# Patient Record
Sex: Female | Born: 1945
Health system: Southern US, Community
[De-identification: ages and names within clinical notes are randomized; demographics above are authoritative.]

## PROBLEM LIST (undated history)

## (undated) DIAGNOSIS — N189 Chronic kidney disease, unspecified: Secondary | ICD-10-CM

## (undated) DIAGNOSIS — E042 Nontoxic multinodular goiter: Secondary | ICD-10-CM

## (undated) DIAGNOSIS — M25562 Pain in left knee: Secondary | ICD-10-CM

## (undated) DIAGNOSIS — Z8739 Personal history of other diseases of the musculoskeletal system and connective tissue: Secondary | ICD-10-CM

## (undated) DIAGNOSIS — I82401 Acute embolism and thrombosis of unspecified deep veins of right lower extremity: Secondary | ICD-10-CM

## (undated) DIAGNOSIS — E7219 Other disorders of sulfur-bearing amino-acid metabolism: Principal | ICD-10-CM

## (undated) DIAGNOSIS — J449 Chronic obstructive pulmonary disease, unspecified: Secondary | ICD-10-CM

## (undated) DIAGNOSIS — Z86718 Personal history of other venous thrombosis and embolism: Secondary | ICD-10-CM

## (undated) DIAGNOSIS — N261 Atrophy of kidney (terminal): Secondary | ICD-10-CM

## (undated) DIAGNOSIS — I10 Essential (primary) hypertension: Secondary | ICD-10-CM

## (undated) DIAGNOSIS — M1990 Unspecified osteoarthritis, unspecified site: Secondary | ICD-10-CM

## (undated) DIAGNOSIS — K219 Gastro-esophageal reflux disease without esophagitis: Secondary | ICD-10-CM

## (undated) HISTORY — DX: Atrophy of kidney (terminal): N26.1

## (undated) HISTORY — PX: ABDOMINAL HYSTERECTOMY: SHX81

## (undated) HISTORY — DX: Pain in left knee: M25.562

## (undated) HISTORY — DX: Nontoxic multinodular goiter: E04.2

## (undated) HISTORY — DX: Personal history of other diseases of the musculoskeletal system and connective tissue: Z87.39

## (undated) HISTORY — DX: Personal history of other venous thrombosis and embolism: Z86.718

## (undated) HISTORY — DX: Essential (primary) hypertension: I10

## (undated) HISTORY — PX: OTHER SURGICAL HISTORY: SHX169

## (undated) HISTORY — DX: Other disorders of sulfur-bearing amino-acid metabolism: E72.19

## (undated) HISTORY — DX: Unspecified osteoarthritis, unspecified site: M19.90

## (undated) HISTORY — DX: Acute embolism and thrombosis of unspecified deep veins of right lower extremity: I82.401

## (undated) HISTORY — DX: Gastro-esophageal reflux disease without esophagitis: K21.9

## (undated) HISTORY — PX: KNEE SURGERY: SHX244

## (undated) HISTORY — PX: TONSILLECTOMY: SUR1361

## (undated) HISTORY — PX: NECK SURGERY: SHX720

---

## 1998-05-16 ENCOUNTER — Ambulatory Visit (HOSPITAL_COMMUNITY): Admission: RE | Admit: 1998-05-16 | Discharge: 1998-05-16 | Payer: Self-pay | Admitting: Neurosurgery

## 1998-05-16 ENCOUNTER — Encounter: Payer: Self-pay | Admitting: Neurosurgery

## 1998-05-30 ENCOUNTER — Ambulatory Visit (HOSPITAL_COMMUNITY): Admission: RE | Admit: 1998-05-30 | Discharge: 1998-05-30 | Payer: Self-pay | Admitting: Neurosurgery

## 1998-05-30 ENCOUNTER — Encounter: Payer: Self-pay | Admitting: Neurosurgery

## 1998-06-10 ENCOUNTER — Ambulatory Visit (HOSPITAL_COMMUNITY): Admission: RE | Admit: 1998-06-10 | Discharge: 1998-06-10 | Payer: Self-pay | Admitting: Neurosurgery

## 1998-06-10 ENCOUNTER — Encounter: Payer: Self-pay | Admitting: Neurosurgery

## 2000-07-16 ENCOUNTER — Other Ambulatory Visit: Admission: RE | Admit: 2000-07-16 | Discharge: 2000-07-16 | Payer: Self-pay | Admitting: Obstetrics and Gynecology

## 2000-08-01 ENCOUNTER — Ambulatory Visit (HOSPITAL_COMMUNITY): Admission: RE | Admit: 2000-08-01 | Discharge: 2000-08-01 | Payer: Self-pay | Admitting: Neurosurgery

## 2000-08-01 ENCOUNTER — Encounter: Payer: Self-pay | Admitting: Neurosurgery

## 2000-08-02 ENCOUNTER — Ambulatory Visit (HOSPITAL_COMMUNITY): Admission: RE | Admit: 2000-08-02 | Discharge: 2000-08-02 | Payer: Self-pay | Admitting: General Surgery

## 2000-08-06 ENCOUNTER — Encounter: Payer: Self-pay | Admitting: Family Medicine

## 2000-08-06 ENCOUNTER — Ambulatory Visit (HOSPITAL_COMMUNITY): Admission: RE | Admit: 2000-08-06 | Discharge: 2000-08-06 | Payer: Self-pay | Admitting: Family Medicine

## 2000-10-09 ENCOUNTER — Inpatient Hospital Stay (HOSPITAL_COMMUNITY): Admission: AD | Admit: 2000-10-09 | Discharge: 2000-10-13 | Payer: Self-pay | Admitting: Neurosurgery

## 2000-10-09 ENCOUNTER — Encounter: Payer: Self-pay | Admitting: Neurosurgery

## 2000-10-10 ENCOUNTER — Encounter: Payer: Self-pay | Admitting: Neurosurgery

## 2000-10-30 ENCOUNTER — Encounter: Admission: RE | Admit: 2000-10-30 | Discharge: 2000-10-30 | Payer: Self-pay | Admitting: Neurosurgery

## 2000-10-30 ENCOUNTER — Encounter: Payer: Self-pay | Admitting: Neurosurgery

## 2001-10-08 ENCOUNTER — Ambulatory Visit (HOSPITAL_COMMUNITY): Admission: RE | Admit: 2001-10-08 | Discharge: 2001-10-08 | Payer: Self-pay | Admitting: Obstetrics and Gynecology

## 2001-10-08 ENCOUNTER — Encounter: Payer: Self-pay | Admitting: Obstetrics and Gynecology

## 2001-10-09 ENCOUNTER — Encounter: Payer: Self-pay | Admitting: Emergency Medicine

## 2001-10-09 ENCOUNTER — Inpatient Hospital Stay (HOSPITAL_COMMUNITY): Admission: EM | Admit: 2001-10-09 | Discharge: 2001-10-14 | Payer: Self-pay | Admitting: Emergency Medicine

## 2001-10-10 ENCOUNTER — Encounter: Payer: Self-pay | Admitting: Urology

## 2001-10-13 ENCOUNTER — Encounter: Payer: Self-pay | Admitting: Urology

## 2001-10-16 ENCOUNTER — Ambulatory Visit (HOSPITAL_COMMUNITY): Admission: RE | Admit: 2001-10-16 | Discharge: 2001-10-16 | Payer: Self-pay | Admitting: Family Medicine

## 2001-10-16 ENCOUNTER — Encounter: Payer: Self-pay | Admitting: Family Medicine

## 2001-11-10 ENCOUNTER — Encounter: Payer: Self-pay | Admitting: Urology

## 2001-11-10 ENCOUNTER — Ambulatory Visit (HOSPITAL_COMMUNITY): Admission: RE | Admit: 2001-11-10 | Discharge: 2001-11-10 | Payer: Self-pay | Admitting: Urology

## 2001-12-15 ENCOUNTER — Encounter: Payer: Self-pay | Admitting: Urology

## 2001-12-15 ENCOUNTER — Ambulatory Visit (HOSPITAL_COMMUNITY): Admission: RE | Admit: 2001-12-15 | Discharge: 2001-12-15 | Payer: Self-pay | Admitting: Urology

## 2001-12-19 ENCOUNTER — Encounter: Admission: RE | Admit: 2001-12-19 | Discharge: 2001-12-19 | Payer: Self-pay | Admitting: Neurosurgery

## 2001-12-19 ENCOUNTER — Encounter: Payer: Self-pay | Admitting: Neurosurgery

## 2002-01-30 ENCOUNTER — Encounter (HOSPITAL_COMMUNITY): Admission: RE | Admit: 2002-01-30 | Discharge: 2002-03-01 | Payer: Self-pay

## 2002-01-30 ENCOUNTER — Encounter: Payer: Self-pay | Admitting: Urology

## 2002-07-17 ENCOUNTER — Emergency Department (HOSPITAL_COMMUNITY): Admission: EM | Admit: 2002-07-17 | Discharge: 2002-07-17 | Payer: Self-pay | Admitting: Emergency Medicine

## 2002-07-17 ENCOUNTER — Encounter: Payer: Self-pay | Admitting: Emergency Medicine

## 2002-07-23 ENCOUNTER — Ambulatory Visit (HOSPITAL_COMMUNITY): Admission: RE | Admit: 2002-07-23 | Discharge: 2002-07-23 | Payer: Self-pay | Admitting: Family Medicine

## 2002-07-23 ENCOUNTER — Encounter: Payer: Self-pay | Admitting: Family Medicine

## 2003-01-19 ENCOUNTER — Encounter: Payer: Self-pay | Admitting: Obstetrics and Gynecology

## 2003-01-19 ENCOUNTER — Ambulatory Visit (HOSPITAL_COMMUNITY): Admission: RE | Admit: 2003-01-19 | Discharge: 2003-01-19 | Payer: Self-pay | Admitting: Obstetrics and Gynecology

## 2004-04-25 ENCOUNTER — Ambulatory Visit (HOSPITAL_COMMUNITY): Admission: RE | Admit: 2004-04-25 | Discharge: 2004-04-25 | Payer: Self-pay | Admitting: Obstetrics and Gynecology

## 2004-05-11 ENCOUNTER — Ambulatory Visit (HOSPITAL_COMMUNITY): Admission: RE | Admit: 2004-05-11 | Discharge: 2004-05-11 | Payer: Self-pay | Admitting: Family Medicine

## 2004-11-24 ENCOUNTER — Ambulatory Visit (HOSPITAL_COMMUNITY): Admission: RE | Admit: 2004-11-24 | Discharge: 2004-11-24 | Payer: Self-pay | Admitting: Cardiovascular Disease

## 2004-11-30 ENCOUNTER — Ambulatory Visit (HOSPITAL_COMMUNITY): Admission: RE | Admit: 2004-11-30 | Discharge: 2004-11-30 | Payer: Self-pay | Admitting: Cardiovascular Disease

## 2005-01-19 ENCOUNTER — Ambulatory Visit (HOSPITAL_COMMUNITY): Admission: RE | Admit: 2005-01-19 | Discharge: 2005-01-19 | Payer: Self-pay | Admitting: Family Medicine

## 2005-05-15 ENCOUNTER — Ambulatory Visit (HOSPITAL_COMMUNITY): Admission: RE | Admit: 2005-05-15 | Discharge: 2005-05-15 | Payer: Self-pay | Admitting: Obstetrics and Gynecology

## 2005-06-01 ENCOUNTER — Ambulatory Visit (HOSPITAL_COMMUNITY): Admission: RE | Admit: 2005-06-01 | Discharge: 2005-06-01 | Payer: Self-pay | Admitting: Family Medicine

## 2006-02-05 ENCOUNTER — Ambulatory Visit (HOSPITAL_COMMUNITY): Admission: RE | Admit: 2006-02-05 | Discharge: 2006-02-05 | Payer: Self-pay | Admitting: Family Medicine

## 2006-05-20 ENCOUNTER — Ambulatory Visit (HOSPITAL_COMMUNITY): Admission: RE | Admit: 2006-05-20 | Discharge: 2006-05-20 | Payer: Self-pay | Admitting: Obstetrics and Gynecology

## 2006-05-27 ENCOUNTER — Encounter: Admission: RE | Admit: 2006-05-27 | Discharge: 2006-05-27 | Payer: Self-pay | Admitting: Obstetrics and Gynecology

## 2006-11-22 ENCOUNTER — Ambulatory Visit (HOSPITAL_COMMUNITY): Admission: RE | Admit: 2006-11-22 | Discharge: 2006-11-22 | Payer: Self-pay | Admitting: Family Medicine

## 2007-06-24 ENCOUNTER — Ambulatory Visit (HOSPITAL_COMMUNITY): Admission: RE | Admit: 2007-06-24 | Discharge: 2007-06-24 | Payer: Self-pay | Admitting: Obstetrics and Gynecology

## 2007-07-10 ENCOUNTER — Ambulatory Visit (HOSPITAL_COMMUNITY): Admission: RE | Admit: 2007-07-10 | Discharge: 2007-07-10 | Payer: Self-pay | Admitting: Family Medicine

## 2008-03-11 ENCOUNTER — Ambulatory Visit (HOSPITAL_COMMUNITY): Admission: RE | Admit: 2008-03-11 | Discharge: 2008-03-11 | Payer: Self-pay | Admitting: Family Medicine

## 2008-03-16 ENCOUNTER — Ambulatory Visit (HOSPITAL_COMMUNITY): Admission: RE | Admit: 2008-03-16 | Discharge: 2008-03-16 | Payer: Self-pay | Admitting: Family Medicine

## 2008-07-19 ENCOUNTER — Ambulatory Visit (HOSPITAL_COMMUNITY): Admission: RE | Admit: 2008-07-19 | Discharge: 2008-07-19 | Payer: Self-pay | Admitting: Obstetrics and Gynecology

## 2008-08-02 ENCOUNTER — Ambulatory Visit (HOSPITAL_COMMUNITY): Admission: RE | Admit: 2008-08-02 | Discharge: 2008-08-02 | Payer: Self-pay | Admitting: Family Medicine

## 2008-09-15 ENCOUNTER — Ambulatory Visit (HOSPITAL_COMMUNITY): Admission: RE | Admit: 2008-09-15 | Discharge: 2008-09-15 | Payer: Self-pay | Admitting: Family Medicine

## 2009-03-30 ENCOUNTER — Ambulatory Visit (HOSPITAL_COMMUNITY): Admission: RE | Admit: 2009-03-30 | Discharge: 2009-03-30 | Payer: Self-pay | Admitting: Family Medicine

## 2009-03-30 ENCOUNTER — Encounter: Payer: Self-pay | Admitting: Orthopedic Surgery

## 2009-04-21 ENCOUNTER — Ambulatory Visit: Payer: Self-pay | Admitting: Orthopedic Surgery

## 2009-04-21 DIAGNOSIS — IMO0002 Reserved for concepts with insufficient information to code with codable children: Secondary | ICD-10-CM

## 2009-04-21 DIAGNOSIS — M1710 Unilateral primary osteoarthritis, unspecified knee: Secondary | ICD-10-CM | POA: Insufficient documentation

## 2009-04-21 DIAGNOSIS — I10 Essential (primary) hypertension: Secondary | ICD-10-CM | POA: Insufficient documentation

## 2009-04-23 ENCOUNTER — Encounter: Payer: Self-pay | Admitting: Orthopedic Surgery

## 2009-07-16 ENCOUNTER — Emergency Department (HOSPITAL_COMMUNITY): Admission: EM | Admit: 2009-07-16 | Discharge: 2009-07-16 | Payer: Self-pay | Admitting: Emergency Medicine

## 2009-08-02 ENCOUNTER — Ambulatory Visit (HOSPITAL_COMMUNITY): Admission: RE | Admit: 2009-08-02 | Discharge: 2009-08-02 | Payer: Self-pay | Admitting: Obstetrics and Gynecology

## 2010-03-14 ENCOUNTER — Ambulatory Visit (HOSPITAL_COMMUNITY)
Admission: RE | Admit: 2010-03-14 | Discharge: 2010-03-14 | Payer: Self-pay | Source: Home / Self Care | Attending: Family Medicine | Admitting: Family Medicine

## 2010-04-23 ENCOUNTER — Encounter: Payer: Self-pay | Admitting: Obstetrics and Gynecology

## 2010-04-24 ENCOUNTER — Ambulatory Visit (HOSPITAL_COMMUNITY)
Admission: RE | Admit: 2010-04-24 | Discharge: 2010-04-24 | Payer: Self-pay | Source: Home / Self Care | Attending: Family Medicine | Admitting: Family Medicine

## 2010-05-02 NOTE — Letter (Signed)
Summary: History form  History form   Imported By: Jacklynn Ganong 04/27/2009 14:33:36  _____________________________________________________________________  External Attachment:    Type:   Image     Comment:   External Document

## 2010-05-02 NOTE — Letter (Signed)
Summary: *Orthopedic Consult Note  [facility]  [address] [place] [address]  [place], [place] [address]   Phone: [phone]  Fax: [phone]    Re:    [name] DOB:    1945/09/24   Dear: Loraine Leriche  Thank you for requesting that we see the above patient for consultation.  A copy of the detailed office note will be sent under separate cover for your review.  On evaluation today is consistent with osteoarthritis of LEFT knee.  However, the patient's symptoms have resolved using Voltaren gel and we have encouraged her to continue with this.  She is to call us back if has any increase in pain or recurrence of symptoms.   Thank you for this opportunity to look after your patient.  Sincerely,   Terrance Mass. MD.

## 2010-05-02 NOTE — Assessment & Plan Note (Signed)
Summary: left knee pain xr at [facility]   Visit Type:  Initial Consult Referring Provider:  Dr. Nobie Putnam Primary Provider:  same  CC:  LEFT knee pain for several years.  History of Present Illness: This is a 65 year old female who presents to [place] with LEFT knee pain.  Referral from Dr.Cresenzo for consult and treat. Dr. Nobie Putnam gave her an IM shot and Voltaren gel and Vicodin.    The patient has had pain in the LEFT knee for several years but the last episode started in December of 2010.  She complains of pain in the back of the knee and on both sides of the knee although currently it is rated as a zero.  It seems to come and go it is getting worse.  She tried some Voltaren gel and that seemed to help.  She fell that something was pulling in the knee and she was having trouble straightening out the leg.  She denied any injury.  She did note some swelling. chief complaint: LEFT KNEE.  -xrays done & where: [facility] on 03-30-09, shows degenerative OA changes.I saw his x-rays and I would agree that these x-rays show degenerative osteoarthritic changes   Allergies (verified): 1)  ! Ace Inhibitors 2)  ! Vicodin (Hydrocodone-Acetaminophen)  Past History:  Past Medical History: Back problems Knee problems Foot problems High blood pressure  Past Surgical History: Right knee Neck Total Abdominal Hysterectomy  Review of Systems General:  Complains of fatigue; denies weight loss, weight gain, fever, and chills. Cardiac :  Complains of phlebitis; denies chest pain, angina, heart attack, heart failure, poor circulation, and blood clots. Resp:  Complains of cough; denies short of breath, difficulty breathing, COPD, and pneumonia. GI:  Complains of reflux; denies nausea, vomiting, diarrhea, constipation, difficulty swallowing, ulcers, and GERD. GU:  Denies kidney failure, kidney transplant, kidney stones, burning, poor stream, testicular cancer,  blood in urine, and . Neuro:  Complains of weakness and unsteady walking; denies headache, dizziness, migraines, numbness, and tremor. MS:  Complains of joint pain, gout, and osteoporosis; denies rheumatoid arthritis, joint swelling, bone cancer, and . Endo:  Complains of diabetes; denies thyroid disease and goiter. Psych:  Denies depression, mood swings, anxiety, panic attack, bipolar, and schizophrenia. Derm:  Denies eczema, cancer, and itching. EENT:  Complains of hoarseness; denies poor vision, cataracts, glaucoma, poor hearing, vertigo, ears ringing, sinusitis, toothaches, and bleeding gums. Immunology:  Denies seasonal allergies, sinus problems, and allergic to bee stings. Lymphatic:  Denies lymph node cancer and lymph edema.  Physical Exam  Additional Exam:  GEN: well developed, well nourished, normal grooming and hygiene, no deformity and normal body habitus.   CDV: pulses are normal, no edema, no erythema. no tenderness  Lymph: normal lymph nodes   Skin: no rashes, skin lesions or open sores   NEURO: normal coordination, reflexes, sensation.   Psyche: awake, alert and oriented. Mood normal   Gait: is normal inspection no tenderness at this time no swelling no redness no  Range of motion is normal  Motor exam 5 over 5 extensor and flexion power  Stability: ACL PCL intact collateral ligaments stable.  Provocative test for meniscal tear negative.  The right lower extremity: had normal alignment, ROM, Strength and stability     Impression & Recommendations:  Problem # 1:  ARTHRITIS, LEFT KNEE (ICD-716.96) Assessment New  Orders: New Patient Level III (14782)  Patient Instructions: 1)  Use voltaren gel  2)  Call us if pain comes back

## 2010-06-09 ENCOUNTER — Other Ambulatory Visit: Payer: Self-pay | Admitting: Obstetrics and Gynecology

## 2010-06-09 DIAGNOSIS — Z139 Encounter for screening, unspecified: Secondary | ICD-10-CM

## 2010-08-08 ENCOUNTER — Ambulatory Visit (HOSPITAL_COMMUNITY)
Admission: RE | Admit: 2010-08-08 | Discharge: 2010-08-08 | Disposition: A | Discharge status: Home / Self Care | Payer: Medicare Other | Source: Ambulatory Visit | Attending: Obstetrics and Gynecology | Admitting: Obstetrics and Gynecology

## 2010-08-08 DIAGNOSIS — Z1231 Encounter for screening mammogram for malignant neoplasm of breast: Secondary | ICD-10-CM | POA: Insufficient documentation

## 2010-08-08 DIAGNOSIS — Z139 Encounter for screening, unspecified: Secondary | ICD-10-CM

## 2010-08-11 NOTE — H&P (Signed)
  NAME:  [name], [name]                ACCOUNT NO.:  [id number]  MEDICAL RECORD NO.:  [id number]           PATIENT TYPE:  LOCATION:                                 FACILITY:  PHYSICIAN:  Dalia Heading, M.D.  DATE OF BIRTH:  Sep 16, 1945  DATE OF ADMISSION: DATE OF DISCHARGE:  LH                             HISTORY & PHYSICAL   CHIEF COMPLAINT:  Need for screening colonoscopy, GERD.  HISTORY OF PRESENT ILLNESS:  The patient is a 65 year old black female who presents for a screening colonoscopy.  She denies any nausea, vomiting, diarrhea, constipation, hematochezia, or melena.  She does have chronic history of GERD.  PAST MEDICAL HISTORY: 1. Non-insulin-dependent diabetes mellitus. 2. Hypertension. 3. High cholesterol levels.  PAST SURGICAL HISTORY:  Neck surgery, knee surgery, total abdominal hysterectomy.  CURRENT MEDICATIONS:  Actos, metformin, nifedipine, estradiol, simvastatin, aspirin, fish oil, and vitamins.  ALLERGIES: 1. VICODIN. 2. ACE INHIBITORS.  REVIEW OF SYSTEMS:  The patient denies any recent cardiopulmonary difficulties or bleeding disorders.  FAMILY MEDICAL HISTORY:  There is no family medical history of colon cancer.  PHYSICAL EXAMINATION:  GENERAL:  The patient is a well-developed, well- nourished black female in no acute distress. HEENT:  Unremarkable. LUNGS:  Clear to auscultation with equal breath sounds bilaterally. HEART:  Regular rate and rhythm without S3, S4, or murmurs. ABDOMEN:  Soft, nontender, nondistended.  No hepatosplenomegaly, masses, hernias are identified. RECTAL:  Deferred to the procedure.  IMPRESSION:  Need for screening colonoscopy, chronic reflux disease.  PLAN:  The patient is scheduled for an EGD and colonoscopy on Aug 15, 2010.  Risks and benefits of the procedure including bleeding and perforation were fully explained to the patient, gave informed consent.     Dalia Heading, M.D.     MAJ/MEDQ  D:  08/01/2010   T:  08/02/2010  Job:  [id number]  cc:   Cristopher Estimable, M.D. Fax: [phone]  Electronically Signed by Franky Macho M.D. on 08/11/2010 10:51:28 AM

## 2010-08-15 ENCOUNTER — Ambulatory Visit (HOSPITAL_COMMUNITY)
Admission: RE | Admit: 2010-08-15 | Discharge: 2010-08-15 | Disposition: A | Discharge status: Home / Self Care | Payer: Medicare Other | Source: Ambulatory Visit | Attending: General Surgery | Admitting: General Surgery

## 2010-08-15 DIAGNOSIS — K219 Gastro-esophageal reflux disease without esophagitis: Secondary | ICD-10-CM | POA: Insufficient documentation

## 2010-08-15 DIAGNOSIS — K222 Esophageal obstruction: Secondary | ICD-10-CM | POA: Insufficient documentation

## 2010-08-15 DIAGNOSIS — Z7982 Long term (current) use of aspirin: Secondary | ICD-10-CM | POA: Insufficient documentation

## 2010-08-15 DIAGNOSIS — I10 Essential (primary) hypertension: Secondary | ICD-10-CM | POA: Insufficient documentation

## 2010-08-15 DIAGNOSIS — Z1211 Encounter for screening for malignant neoplasm of colon: Secondary | ICD-10-CM | POA: Insufficient documentation

## 2010-08-15 DIAGNOSIS — K644 Residual hemorrhoidal skin tags: Secondary | ICD-10-CM | POA: Insufficient documentation

## 2010-08-15 DIAGNOSIS — E7800 Pure hypercholesterolemia, unspecified: Secondary | ICD-10-CM | POA: Insufficient documentation

## 2010-08-15 DIAGNOSIS — Z79899 Other long term (current) drug therapy: Secondary | ICD-10-CM | POA: Insufficient documentation

## 2010-08-15 DIAGNOSIS — E119 Type 2 diabetes mellitus without complications: Secondary | ICD-10-CM | POA: Insufficient documentation

## 2010-08-15 LAB — GLUCOSE, CAPILLARY: Glucose-Capillary: 184 mg/dL — ABNORMAL HIGH (ref 70–99)

## 2010-08-16 NOTE — Op Note (Signed)
NAME:  [name], [name]                ACCOUNT NO.:  [id number]  MEDICAL RECORD NO.:  [id number]           PATIENT TYPE:  O  LOCATION:  DAYP                          FACILITY:  [facility]  PHYSICIAN:  Dalia Heading, M.D.  DATE OF BIRTH:  09-28-1945  DATE OF PROCEDURE:  08/15/2010 DATE OF DISCHARGE:                              OPERATIVE REPORT   PREOPERATIVE DIAGNOSIS:  Need for screening colonoscopy, gastroesophageal reflux disease.  POSTOPERATIVE DIAGNOSIS:  Need for screening colonoscopy, gastroesophageal reflux disease.  PROCEDURE:  Colonoscopy, EGD with biopsy.  SURGEON:  Dalia Heading, MD  ANESTHESIA:  Demerol 50 mg IV, Versed 5 mg IV.  INDICATIONS:  The patient is a 65 year old black female who presents for screening colonoscopy and an EGD for gastroesophageal reflux disease. The risks and benefits of the procedures including bleeding and perforation were fully explained to the patient, gave informed consent.  PROCEDURE NOTE:  The patient was placed in the left lateral decubitus position after placement of nasal cannula, pulse oximetry, noninvasive blood pressure monitoring.  Demerol and Versed were used throughout the procedure for anesthesia.  Rectal examination was performed, which revealed some mild external hemorrhoidal disease.  The colonoscope was advanced to the cecum without difficulty.  Confirmation of placement to the cecum was done using transabdominal palpation and landmarks.  The bowel preparation was adequate.  The cecum was fully visualized and noted to be within normal limits.  The ascending colon, transverse colon, descending colon, sigmoid colon, and rectum are all within normal limits.  No abnormal lesions were noted.  The dentate line was inspected and noted to be within normal limits.  The colonoscope was removed after evacuation of air from the colon and rectum.  Next, an EGD was performed.  Hurricaine spray was used on the oropharynx.  The  endoscope was advanced down to the second portion of the duodenum without difficulty.  The first and second portions of the duodenum were within normal limits.  No abnormal lesions were noted. The pylorus was noted to be widely patent.  A prepyloric mucosal biopsy was taken for a CLO test.  The patient had a very elongated J-shaped stomach.  On retroflexion, the patient was noted to have a mild sliding hiatal hernia.  A Schatzki's ring was noted at the Z-line, though this was not in need of dilatation.  There was no evidence of Barrett's esophagitis.  The Z-line was noted to be at 39 cm from the teeth.  The esophagus was within normal limits.  No abnormal lesions were noted. All air was evacuate from the stomach and esophagus prior to removal of the endoscope.  The patient tolerated both the procedures well, was transferred back to Day Surgery in stable condition.  COMPLICATIONS:  None.  SPECIMEN:  CLO test.  RECOMMENDATIONS:  The patient should continue on her proton pump inhibitor.  Recommendations on avoiding reflux disease with hiatal hernia will be given.  A screening colonoscopy is recommended in 10 years.     Dalia Heading, M.D.     MAJ/MEDQ  D:  08/15/2010  T:  08/15/2010  Job:  [id number]  cc:   Corrie Mckusick, M.D. Fax: [phone]  Electronically Signed by Franky Macho M.D. on 08/16/2010 08:51:53 AM

## 2010-08-18 NOTE — Op Note (Signed)
NAME:  [name], [name]                          ACCOUNT NO.:  [id number]   MEDICAL RECORD NO.:  [id number]                   PATIENT TYPE:  AMB   LOCATION:  DAY                                  FACILITY:  [facility]   PHYSICIAN:  Dennie Maizes, M.D.                DATE OF BIRTH:  12-Mar-1946   DATE OF PROCEDURE:  11/10/2001  DATE OF DISCHARGE:  11/10/2001                                 OPERATIVE REPORT   PREOPERATIVE DIAGNOSES:  Right hydronephrosis, right renal colic, right  distal ureteral stricture.   POSTOPERATIVE DIAGNOSES:  Right hydronephrosis, right renal colic, right  distal ureteral stricture.   OPERATION/PROCEDURE:  Cystoscopy, removal of right ureteral stent, balloon  dilation of right ureteral stricture, right ureteroscopy and right ureteral  stent placement.   ANESTHESIA:  Spinal.   SURGEON:  Dennie Maizes, M.D.   COMPLICATIONS:  None.   INDICATIONS FOR PROCEDURE:  This 65 year old female is admitted to the  hospital with severe right renal colic a few weeks ago. Evaluation revealed  moderate right hydronephrosis in the upper right ureter. The patient  underwent further evaluation with cystoscopy, right retrograde pyelogram and  right ureteral stent placement. A right ureteral stricture was noted. The  patient was brought back to the OR today for cystoscopy, retrograde  pyelogram, balloon dilation of the stricture and stent placement.   DESCRIPTION OF PROCEDURE:  Spinal anesthesia was induced ad the patient was  placed on the OR table in the dorsal lithotomy position. The lower abdomen  and genitalia were prepped and draped in a sterile fashion. Cystoscopy was  done with a 25 [place] scope. The appearance of the bladder was normal. The  lower end of the stent was then held with the grasping forceps and removed  without any difficulty. A 5 French rigid catheter was then placed in the  right ureteral orifice and a retrograde pyelogram was done by using about 10  cc  of Renografin-60. This revealed a distal ureteral stricture about 5 cm in  length or a sacroiliac giant lesion. Mild right hydronephrosis was also  noted.   A 6 French open ended catheter was then placed in the right ureteral  orifice. A 0.038 inch Benson guidewire with a flexible tip was then inserted  in the right collecting system. The open ended catheter was then removed. A  15 French balloon dilating catheter was then inserted over the guidewire and  the balloon was placed in the region of the stricture. With fluoroscopy  guidance, the stricture was dilated with inflation of the balloon. The  balloon dilating catheter was then removed. A 20 cm ureteral access sheath  was then placed in the right distal ureter. The guidewire was then removed.  The flexible ureteroscope was then inserted into the renal pelvis.  Examination was done of the collecting system while withdrawing the scope.  There was no abnormality  in the upper  ureter. There was some erythema and  edema in the area of the stricture and there was no intraluminal mass or  tumor. The stricture was found to be adequately dilated.  The ureteroscope  was then removed. The guidewire was  reinserted into the  collecting system. The urethral access sheath was removed. A 6 French 26 cm  size stent with the string was inserted in the right collecting system  without any difficulty. The cystoscope was removed. The patient was  transferred to the PACU in a satisfactory condition.                                               Dennie Maizes, M.D.    SK/MEDQ  D:  11/10/2001  T:  11/12/2001  Job:  [id number]   cc:   Jonell Cluck, M.D.

## 2010-08-18 NOTE — Op Note (Signed)
[name], TROSTLE                ACCOUNT NO.:  [id number]   MEDICAL RECORD NO.:  [id number]          PATIENT TYPE:  AMB   LOCATION:  SDS                          FACILITY:  [facility]   PHYSICIAN:  [name], M.D.   DATE OF BIRTH:  1945/04/14   DATE OF PROCEDURE:  DATE OF DISCHARGE:                                 OPERATIVE REPORT   Ms. Claude is a 65 year old mildly overweight African American female with  history of hypertension and hyperlipidemia.  She has history of severe right  hydronephrosis requiring ureteral stenting in the past.  Her right renal  dimension has been diminishing in size.  Renal Doppler suggests right renal  artery stenosis.  CT angiogram showed widely patent renal.  She presents now  for renal angiography to define her anatomy and potential etiology of her  diminishing renal dimension.   DESCRIPTION OF PROCEDURE:  Patient is brought to the sixth floor [place].  [facility] peripheral vascular angiographic suite in the  postabsorptive state.  She was premedicated with p.o. Valium and IV Versed.  Her right groin was prepped and shaved in the usual sterile fashion.  Xylocaine 1% was used for local anesthesia.  A 6 French sheath was inserted  into the right femoral artery using standard Seldinger technique.  A 5  French pigtail catheter is used for __________ abdominal aortography.  Visipaque dye was used for the entirety of the case.  Retrograde aortic  pressure was monitored at the end of the case.   ANGIOGRAPHIC RESULTS:  1.  Right and left renal arteries:  Normal.  2.  Intrarenal abdominal aorta:  Normal.  3.  Iliac bifurcation:  Normal.   IMPRESSION:  Ms. Ripoll has a widely patent right renal artery with tapered  pruned vessels consistent with nephrosclerosis.  There is no obstructive  component.   The sheath was removed and pressure was held on the groin to achieve  hemostasis.  Patient left the lab in stable condition.  She will be  hydrated, discharged home later today as an outpatient and will see me back  in the office next week for follow-up, at which time I will refer her to a  nephrologist for further evaluation.      [name], M.D.  Electronically Signed     JB/MEDQ  D:  11/30/2004  T:  11/30/2004  Job:  [id number]   cc:   6th Floor PV Angio Suite   [facility]  [address], [place] [address]   Patrica Duel, M.D.  [address]  [place]  [place] [address]  Fax: [phone]

## 2010-08-18 NOTE — H&P (Signed)
[place]. [facility]  Patient:    [name], [name]                         MRN: [id number] Adm. Date:  10/09/00 Attending:  Payton Doughty, M.D.                         History and Physical  ADMISSION DIAGNOSIS:  Cervical spondylosis with myelopathy.  ATTENDING PHYSICIAN:  Payton Doughty, M.D.  HISTORY OF PRESENT ILLNESS:  This is a now 65 year old right-handed black girl that I have been following for seven years for neck pain.  She has spondylitic disease at C4-5, C5-6 and C6-7 which has progressed to the point where she has compressive pathology and she is now admitted for an anterior cervical diskectomy and fusion.  Her medical history is remarkable for hypertension and phlebitis 20 years ago.  She has also had a hysterectomy and a knee operation. She suffers from some depression.  MEDICATIONS ON ADMISSION:  Her meds are Altace 5 mg a day, Adalat 30 mg a day, Amaryl 4 mg 1 twice a day, Glucophage X-RAY 500 mg 2 at night, Celebrex 200 mg twice a day, Premarin 0.625 mg a day, prednisolone in her right eye and an aspirin a day.  ALLERGIES:  None.  FAMILY HISTORY:  Mother died at 40 years of age with a stroke.  Father is in good health with prostate cancer at 67 years of age.  REVIEW OF SYSTEMS:  Remarkable for neck pain and arm pain.  PHYSICAL EXAMINATION:  HEENT:  Exam is within normal limits.  She has limited range of motion in her neck.  CHEST:  Clear.  HEART:  Regular rate and rhythm.  ABDOMEN:  Nontender, no hepatosplenomegaly.  EXTREMITIES:  Without clubbing or cyanosis.  GU:  Deferred.  Peripheral pulses are good.  NEUROLOGIC:  She is awake, alert and oriented.  Cranial nerves are intact. Motor exam demonstrates 5/5 strength throughout the upper and lower extremities save for the left triceps which is 4/5. She has a left C7 sensory deficit.  Deep tendon reflexes are 2 at the biceps, triceps on the right, 1 at the biceps, absent at the  triceps on the left, absent at the brachioradialis on the left.  Lower extremities are nonmyelopathic.  Hoffmans is positive.  MRI shows significant spondylitic disease at C4-5, C5-6 and C6-C7 with foraminal narrowing at all levels and spinal stenosis.  CLINICAL IMPRESSION:  Cervical spondylosis with developing myelopathy.  PLAN:  For anterior cervical diskectomy and fusion at C4-5, C5-6 and C6-7. The risks and benefits of this approach have been discussed with her and she wishes to proceed. DD:  10/09/00 TD:  10/09/00 Job: [id number]

## 2010-08-18 NOTE — Discharge Summary (Signed)
[place]. [name]  Patient:    [name], [name]                       MRN: [id number] Adm. Date:  [id number] Disc. Date: 10/13/00 Attending:  Emeterio Reeve CC:         Payton Doughty, M.D.  Jonell Cluck, M.D., [place], [place]  Barbaraann Share, M.D. [facility]   Discharge Summary  BRIEF HISTORY:  The patient is a 65 year old black female, a patient of Payton Doughty, M.D., who suffers from neck and arm pain.  She elected to proceed with surgery.  For past medical history, past surgical history, medications prior to admission, drug allergies, family medical history, social history, admission physical exam, admitting assessment and plan, etc., please refer to the typed history and physical.  HOSPITAL COURSE:  Payton Doughty, M.D., admitted the patient on October 09, 2000, with a diagnosis of C4-5, C5-6, and C6-7 spondylosis and cervical myelopathy. On the day of admission, he performed a C4-5, C5-6, and C6-7 anterior cervical diskectomy, fusion, and plating (tether) without complication.  The patients postoperative course was remarkable for some tongue swelling. The patient was started on Decadron.  She was transferred to the neurosurgical intensive care unit for more closer monitoring.  A pulmonary critical care medicine consult was obtained by Barbaraann Share, M.D., and they thought that the patients swelling was secondary to angioedema secondary to ACE inhibitors.  She continued to be treated with a steroid taper.  The patient was monitored, her tongue swelling went down, and she was transferred to the neurosurgical general floor and observed.  By postoperative day #4, i.e. October 13, 2000, the patient was afebrile, her vital signs were stable, she was eating well and ambulating well, her wound was healing well without signs of infection, her tongue swelling had completely resolved, and she was in no distress.  She requested discharge to home.  Her glucose over  the last 24 hours ranged from 93-184 while she was off her Glucophage.  DISPOSITION:  The patient is discharged to home.  DISCHARGE INSTRUCTIONS:  The patient is instructed to call Payton Doughty, M.D., on Monday for a follow-up appointment.  She has been instructed to stop taking her Glucophage and keep track of her glucose (she has a monitor at home) and call Jonell Cluck, M.D., with the glucose readings and discuss with him whether or not she should go back on the Glucophage.  She has been instructed not to take any ACE inhibitors as this was felt to be the cause of her tongue swelling.  She therefore was instructed not to take her Altace, but resume her other medications, except for her Altace and Glucophage as above.  She was also sent home on a three-day prednisone taper.  She was given written discharge instructions.  DISCHARGE MEDICATIONS: 1. Vicodin, #50, one to two p.o. q.4h. p.r.n. for pain with no refills.  Limit    eight per day. 2. Prednisone 10 mg, #3, one p.o. q.d.  As above, she was instructed to resume her outpatient medical regimen, except for the Glucophage and Altace.  FINAL DIAGNOSES: 1. C4-5, C5-6, and C6-7 spondylosis and cervical myelopathy. 2. 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  PROCEDURES PERFORMED:  C4-5, C5-6, and C6-7 anterior cervical diskectomy, fusion, and plating. DD:  10/13/00 TD:  10/13/00 Job: [id number]

## 2010-08-18 NOTE — Op Note (Signed)
[facility]  Patient:    [name], [name] Visit Number: [id number] MRN: [id number]          Service Type: OUT Location: RAD Attending Physician:  Tilda Burrow Dictated by:   Dennie Maizes, M.D. Proc. Date: 10/10/01 Admit Date:  10/08/2001 Discharge Date: 10/08/2001   CC:         Elfredia Nevins, M.D.   Operative Report  PREOPERATIVE DIAGNOSIS:  Right hydronephrosis and hydroureter, right renal colic, right distal ureteral stricture.  POSTOPERATIVE DIAGNOSIS:  Right hydronephrosis and hydroureter, right renal colic, right distal ureteral stricture.  OPERATIVE PROCEDURE:  Cystoscopy, right retrograde pyelogram, right ureteroscopy, and right ureteral stent placement.  ANESTHESIA:  Spinal.  SURGEON:  Dennie Maizes, M.D.  COMPLICATIONS:  None.  INDICATIONS FOR PROCEDURE:  This 65 year old female was admitted to the hospital with severe right renal colic.  Nonconstrast spiral CT scan of the abdomen and pelvis revealed severe right hydronephrosis and hydroureter. There was also perinephric stranding.  No definite stone was noted.  The patient was taken the OR today for a cystoscopy, right retrograde pyelogram, possible stone extraction, and stent placement.  DESCRIPTION OF PROCEDURE:  Spinal anesthesia was induced and the patient was placed on the OR table in the dorsal lithotomy position.  The lower abdomen and genitalia were prepped and draped in a sterile fashion.  Examination revealed meatal stenosis.  The ureteral meatus was dilated up to a 21 [place] with metal sounds.  Cystoscopy was done with the 25 ______ scope.  ______ ureteral orifice, and bladder mucosa were normal.  No abnormalities of the bladder was noted.  A 5 French Wedge catheter was then placed in the right ureteral orifice.  About 10 cc of Renografin solution was injected into the collecting system and the retrograde pyelogram was done using C arm fluoroscopy.  Distal  ureter was found to be normal.  There was some area of narrowing in the ureter at the level of the iliac crest.  This ______ about 4 to 5 cm in length.  The ureter above the narrowing was found to be moderately dilated.  Upper ureter was dilated and tortuous.  There was a severe degree of right hydronephrosis.  There was also vascular impression on the ureter just above the stricture.  A 6 French open-ended catheter was then placed in the right distal ureter.  A 0.038 inch Bentson guider with a flexible tip was then advanced into the renal pelvis with some difficulty.  Distal ureter was then dilated using an 18 French balloon dilating catheter.  A ureteral access sheath was placed in the distal ureter.  Ureteroscopy was done with a flexible ureteroscope.  The ureteroscope was passed over the guide well.  It was difficult to pass the flexible ureteroscope through the stricture. Examination of the distal ureter was normal.  There was severe degree of ureteral stricture and the scope could not be passed through this stricture. The ureteral access sheath and the flexible ureteroscope was then removed leaving the guider in place.  Ureteroscopy was done with the rigid ureteroscope.  Again, the scope could not be passed through the stricture.  A 7 French 26 cm, size 10, was then inserted over the guide wire and pushed into the right collecting system.  Instruments were removed.  Pelvic examination was negative.  The patient was transferred to the PACU in a satisfactory condition.  I reviewed x-rays with the radiologist, Dr. Purcell Mouton, who concurred with the diagnosis of right distal ureteral stricture.  I plan to do a CT scan of the abdomen and pelvis to evaluate for extrinsic mass compressing the ureter.  I plan to do a stent removal and ureteroscopy at a later date. Dictated by:   Dennie Maizes, M.D. Attending Physician:  Tilda Burrow DD:  10/10/01 TD:  10/14/01 Job: [id number]

## 2010-08-18 NOTE — Discharge Summary (Signed)
[facility]  Patient:    [name], [name] Visit Number: [id number] MRN: [id number]          Service Type: MED Location: 3A A327 01 Attending Physician:  Cassell Smiles. Dictated by:   Elfredia Nevins, M.D. Admit Date:  10/09/2001 Discharge Date: 10/14/2001                             Discharge Summary  DISCHARGE MEDICATIONS: 1. Amaryl 4 mg p.o. b.i.d. 2. Premarin 0.625 mg p.o. q.d. 3. Aspirin 81 mg p.o. q.d. 4. Procardia XL 30 mg x2 p.o. q.d. 5. Halcion p.r.n. 6. Tylox p.r.n. 7. Specifically holding her usual Glucophage x4 days.  HOSPITAL COURSE:  In summary, the patient was admitted with obstructive hydronephrosis with urinary tract infection.  She responded well to analgesics and she underwent surgical placement of a urinary stent for decompression, which was successful.  Subsequent CT of the abdomen revealed no evidence of extraureteral compression, i.e., malignancy, etc.  DISPOSITION:  She is discharged for follow up in the office in one week. Dictated by:   Elfredia Nevins, M.D. Attending Physician:  Cassell Smiles DD:  10/14/01 TD:  10/14/01 Job: [id number]

## 2010-08-18 NOTE — H&P (Signed)
NAME:  [name], [name]                          ACCOUNT NO.:  [id number]   MEDICAL RECORD NO.:  [id number]                   PATIENT TYPE:  AMB   LOCATION:  DAY                                  FACILITY:  [facility]   PHYSICIAN:  Dennie Maizes, M.D.                DATE OF BIRTH:  Mar 12, 1946   DATE OF ADMISSION:  11/10/2001  DATE OF DISCHARGE:                                HISTORY & PHYSICAL   CHIEF COMPLAINT:  Right flank pain, right hydronephrosis, right distal  urethral stricture.   HISTORY OF PRESENT ILLNESS:  This 65 year old female was admitted to [facility] about a month ago with severe right renal colic.  Noncontrast  spiral CT of the abdomen and pelvis revealed severe right hydronephrosis and  hydroureter.  There was also perinephritic strangling.  No definite stone  was noted.  Further evaluation was done with cystoscopy, right retrograde  pyelogram, and urethroscopy.  This revealed a right distal urethral  stricture.  The urethroscope could not be passed beyond the level of the  stricture.  __________ was placed.  The patient also has been evaluated with  a CT scan of the abdomen and pelvis with contrast to evaluate for extensive  mass compressing the ureter.  No mass has been noted.  The patient has  relief of pain at present.  She has some urinary frequency and mild flank  pain due to the presence of the stent.  There is no ST or voiding  difficulty, gross hematuria, fever, or chills.  She is brought to the OR  today for cystoscopy, removal of right urethral stent, urethroscopy, and  possible balloon dilation of the right distal urethral stricture.   PAST MEDICAL HISTORY:  1. History of diabetes mellitus.  2. Hypertension.  3. Degenerative joint disease.  4. Status post cervical spine fusion.  5. Status post hysterectomy.  6. Status post arthroscopic right knee surgery.  7. Status post tonsillectomy.  8. History of thrombophlebitis about 38 years ago.   MEDICATIONS:  1. Amaryl 4 mg one p.o. b.i.d.  2. Glucophage XL 500 mg two p.o. q.p.m.  3. Celebrex 200 mg one p.o. q.d.  4. Premarin 0.625 mg one p.o. q.d.  5. Aspirin one p.o. q.d.  6. Multivitamins one p.o. q.d.  7. Calcium one p.o. q.d.  8. Adalat 60 mg one p.o. q.d.   ALLERGIES:  ACE INHIBITORS.   PHYSICAL EXAMINATION:  HEENT:  Normal.  NECK:  No masses.  LUNGS:  Clear to auscultation.  HEART:  Regular rate and rhythm, no murmur.  ABDOMEN:  Soft, no palpable flank mass, no costovertebral angle tenderness.  Bladder not palpable.  PELVIC:  Examination not done.   IMPRESSION:  Right hydronephrosis and hiatal ureter, right distal urethral  stricture.   PLAN:  Cystoscopy, removal of right urethral stent, urethroscopy, and  balloon dilation of right distal urethral stricture [facility].  I have discussed with the patient regarding the diagnosis,  operative details, alternative treatment, possible risks and complications,  and she has agreed for the procedure to be done.                                                  Dennie Maizes, M.D.    SK/MEDQ  D:  11/09/2001  T:  11/09/2001  Job:  [id number]   cc:   Jonell Cluck, M.D.

## 2010-08-18 NOTE — Consult Note (Signed)
[facility]  Patient:    [name], [name] Visit Number: [id number] MRN: [id number]          Service Type: Attending:  Dennie Maizes, M.D. Dictated by:   Dennie Maizes, M.D. Proc. Date: 10/09/01   CC:         Elfredia Nevins, M.D.  Patrica Duel, M.D.   Consultation Report  ATTENDING PHYSICIAN:  Elfredia Nevins, M.D.  CONSULTING PHYSICIAN:  Dennie Maizes, M.D.  REASON FOR CONSULTATION:  Severe right flank pain, right hydronephrosis.  HISTORY OF PRESENT ILLNESS:  This 65 year old female experienced sudden onset of severe right flank pain radiating to the front at 4 a.m. today.  This was associated with nausea.  Patient also has intermittent gross hematuria.  She came to the emergency room for further evaluation.  Microscopic hematuria was noted.  The patient had been evaluated with a noncontrast spiral CT scan of the abdomen and pelvis.  This revealed a severe right hydronephrosis and hydroureter.  Paranephric stranding had been noted on the right side.  There was no evidence of urinary calculi.  Patient has not passed any stone in the urine.  She continues to have severe pain and I was asked to see the patient for further evaluation.  Patient did not have any fever, chills, voiding difficulty, or dysuria.  There is no past history of gross hematuria, urolithiasis, or urinary tract infections.  PAST MEDICAL HISTORY:  History of diabetes mellitus, hypertension, degenerative joint disease, status post cervical spine fusion, status post hysterectomy, status post arthroscopy, right knee surgery, status post tonsillectomy.  History of thrombophlebitis about 38 years ago.  MEDICATIONS: 1. Amaryl 4 mg one p.o. b.i.d. 2. Glucophage XL 5 mg two p.o. q.p.m. 3. Celebrex 200 mg one p.o. q.d. 4. Premarin 0.625 mg one p.o. q.d. 5. Aspirin one p.o. q.d. 6. Multivitamins one p.o. q.d. 7. Calcium one p.o. q.d. 8. Adalat 60 mg one p.o. q.d. 9. Calcium 0.125 mg  p.o. q.h.s. p.r.n. insomnia.  ALLERGIES:  ACE INHIBITORS.  PHYSICAL EXAMINATION:  GENERAL:  The patient is in severe pain.  ABDOMEN:  Soft.  No palpable flank mass.  Moderate right costovertebral angle tenderness is noted.  Bladder not palpable.  PELVIC:  Examination not done.  LABORATORY DATA:  Urinalysis - blood large, leukocyte esterase small, WBCs 2150 per high-power field, RBCs 11 to 25 per high-power field.  Urine culture and sensitivity is pending.  CBC:  WBC 11.1, hemoglobin 12.3, hematocrit 36.1, BUN 14, creatinine 1.2.  CT scan of the abdomen and pelvis revealed severe right hydronephrosis and hydroureter with perinephric stranding.  No stones were noted.  IMPRESSION:  Right hydronephrosis and right hydroureter, right renal colic.  The cause of obstruction is not known.  It could be due to a stone, necrosed renal papula, or blood clot.  The patient has a history of diabetes mellitus and renal papular necrosis is common in diabetics.  PLAN:  Cystoscopy, right retrograde pyelogram, and ureteroscopic stone extraction with stent placement under anesthesia in the a.m.  I have discussed with the patient regarding the possible diagnosis, operative details, outcome, alternative treatments, possible risks and complications, and she has agreed for the procedure to be done. Dictated by:   Dennie Maizes, M.D. Attending:  Dennie Maizes, M.D. DD:  10/09/01 TD:  10/09/01 Job: [id number]

## 2010-08-18 NOTE — H&P (Signed)
[facility]  Patient:    [name], [name] Visit Number: [id number] MRN: [id number]          Service Type: OUT Location: RAD Attending Physician:  Tilda Burrow Dictated by:   Elfredia Nevins, M.D. Admit Date:  10/08/2001 Discharge Date: 10/08/2001                           History and Physical  DATE OF BIRTH:  [birth year]  CHIEF COMPLAINT:  Right-sided flank and abdominal pain.  HISTORY OF PRESENT ILLNESS:  At 0400 hours the patient was awakened from sleep with severe right flank pain and right lower quadrant pain radiating to her right inguinal area.  She had nausea without vomiting.  The pain persisted and was accompanied with nausea.  There was no hematemesis, hematochezia, or melena.  She denied any fever, rigors, or chills.  PAST MEDICAL HISTORY: 1. Status post cervical neck surgery. 2. Non-insulin-dependent diabetes mellitus. 3. Low back pain with right lumbar radiculitis.  SOCIAL HISTORY:  Positive cigarette smoking.  Occasional alcohol use, but no heavy use.  FAMILY HISTORY:  Noncontributory for this illness.  REVIEW OF SYSTEMS:  She has had mild constipation but, otherwise, negative in detail.  PHYSICAL EXAMINATION:  SKIN:  Unremarkable.  Normal in color.  HEENT, NECK:  No JVD or adenopathy.  Neck is supple.  CHEST:  Clear.  CARDIAC:  Regular rhythm.  Without murmur, gallop, or rub.  ABDOMEN:  Positive right CVA tenderness.  Positive right mesogastric tenderness with voluntary guarding.  EXTREMITIES:  Nontender.  No clubbing, cyanosis, or edema.  NEUROLOGIC:  Nonfocal.  LABORATORY DATA:  Modest elevation of white count.  Positive left shift. Positive pyuria and hematuria.  EKG reveals normal sinus rhythm.  There was no acute ischemic or infarction changes present.  CT scan of the abdomen was obtained and revealed moderate to severe right hydronephrosis and hydroureter.  She also was noted to have  moderate perinephric and periureteral inflammation which was felt to be obstructive, but no stone was visualized.  She also had three high-density left renal lesions suggestive to the radiologist of hemorrhagic cysts.  IMPRESSION:  Urinary tract infection accompanied by hydroureter and possible obstruction, i.e., obstructive pyelonephritis.  PLAN:  The patient will be admitted for analgesia, IV antibiotics.  Due to the diabetes plus possible obstructive pyelonephritis as evidenced by CT, urology will be consulted for consideration of a percutaneous nephrostomy tube or other intervention as felt necessary.  She will be monitored expectantly in the hospital. Dictated by:   Elfredia Nevins, M.D. Attending Physician:  Tilda Burrow DD:  10/09/01 TD:  10/12/01 Job: [id number]

## 2010-08-18 NOTE — Op Note (Signed)
[facility]  Patient:    [name], [name]                         MRN: [id number] Proc. Date: 10/09/00 Attending:  Payton Doughty, M.D.                           Operative Report  PREOPERATIVE DIAGNOSES:  Spondylosis and myelopathy at C4-5, C5-6, C6-7.  POSTOPERATIVE DIAGNOSES:  Spondylosis and myelopathy at C4-5, C5-6, C6-7.  OPERATION/PROCEDURE:  Anterior cervical fusion, C4-5, C5-6, C6, C7 with a tethered plate.  SURGEON:  Payton Doughty, M.D.  ANESTHESIA: General endotracheal anesthesia.  PREPARATION:  Prep with sterile Betadine prep and cleaned with alcohol wipes.  COMPLICATIONS:  None.  ASSISTANTS:  Hewitt Shorts, M.D. and _____.  DESCRIPTION OF PROCEDURE:  This is a 65 year old lady with severe cervical spondylosis was taken to the operating room, smoothly anesthetized and intubated, and placed supine on the operating table in halter head traction.  Following shave, prep and drape in the usual sterile fashion, skin was incised for 4 cm above and 4 cm below the level of the carotid tubercle on the left side, paralleling the sternocleidomastoid muscle.  The platysma was identified, elevated and divided and undermined.  The medial dissection reveals sternocleidomastoid and medial dissection revealed the carotid artery tract on the left.  The trachea and esophagus were retracted laterally to the right exposing the bones of the anterior cervical spine. Markers were placed and intraoperative x-ray obtained to confirm correctness of the level.  Having confirmed correctness of level, the longus coli was taken down bilaterally and the Shadow Line self-retaining retractor placed in transverse and cephalocaudal directions.  The anterior osteophytes were removed.  Diskectomy was then carried out at C4-5, C5-6 and C6-C7.  At C4-5 there was a central disk protruding into the midline distracting the posterior longitudinal ligament.  At C6-7 and C5-6,  there were large posterior osteophytes with extensive involvement in the anterior epidural space.  These were removed carefully with no cord compression.  Following complete diskectomy, the neuroforamen were carefully explored and nerve roots found to be free.  The 7 mm bone grafts were then fashioned and patellar allograft tapped into place.  A 52 mm tethered plate was then placed with two screws in C4, one in C5, one in C6, and two in C7; 12 mm screws were used. Intraoperative x-rays show good placement of bone grafts, plate and screws. The wound was once again irrigated, hemostasis assured.   The platysma was reapproximated with 3-0 Vicryl in interrupted fashion.  Subcutaneous tissue was reapproximated with 3-0 Vicryl interrupted fashion and the skin was closed with 4-0 Vicryl in a running subcuticular fashion.  Benzoin and Steri-Strips were placed and made occlusive with Telfa and Op-Site.  The patient was placed in Aspen collar and returned to the recovery room in good condition. DD:  10/09/00 TD:  10/09/00 Job: [id number]

## 2010-08-18 NOTE — Procedures (Signed)
[facility]  Patient:    [name], [name] Visit Number: [id number] MRN: [id number]          Service Type: OUT Location: RAD Attending Physician:  Tilda Burrow Dictated by:   Kari Baars, M.D. Proc. Date: 10/09/01 Admit Date:  10/08/2001 Discharge Date: 10/08/2001                            EKG Interpretations  The rhythm is a sinus rhythm with a rate in the 70s.  There is only small R-wave in V1 and V2.  This may be due to previous anteroseptal myocardial infarction.  Clinical correlation is suggested.  Otherwise, normal echocardiogram. Dictated by:   Kari Baars, M.D. Attending Physician:  Tilda Burrow DD:  10/09/01 TD:  10/13/01 Job: [id number]

## 2010-10-05 ENCOUNTER — Emergency Department (HOSPITAL_COMMUNITY): Payer: Medicare Other

## 2010-10-05 ENCOUNTER — Observation Stay (HOSPITAL_COMMUNITY)
Admission: EM | Admit: 2010-10-05 | Discharge: 2010-10-06 | Disposition: A | Discharge status: Home Health | Payer: Medicare Other | Attending: Internal Medicine | Admitting: Internal Medicine

## 2010-10-05 ENCOUNTER — Ambulatory Visit (HOSPITAL_COMMUNITY)
Admission: RE | Admit: 2010-10-05 | Discharge: 2010-10-05 | Disposition: A | Discharge status: Home / Self Care | Payer: Medicare Other | Source: Ambulatory Visit | Attending: Family Medicine | Admitting: Family Medicine

## 2010-10-05 ENCOUNTER — Other Ambulatory Visit (HOSPITAL_COMMUNITY): Payer: Self-pay | Admitting: Family Medicine

## 2010-10-05 DIAGNOSIS — Z79899 Other long term (current) drug therapy: Secondary | ICD-10-CM | POA: Insufficient documentation

## 2010-10-05 DIAGNOSIS — I824Y9 Acute embolism and thrombosis of unspecified deep veins of unspecified proximal lower extremity: Principal | ICD-10-CM | POA: Insufficient documentation

## 2010-10-05 DIAGNOSIS — K219 Gastro-esophageal reflux disease without esophagitis: Secondary | ICD-10-CM | POA: Insufficient documentation

## 2010-10-05 DIAGNOSIS — R609 Edema, unspecified: Secondary | ICD-10-CM

## 2010-10-05 DIAGNOSIS — E119 Type 2 diabetes mellitus without complications: Secondary | ICD-10-CM | POA: Insufficient documentation

## 2010-10-05 DIAGNOSIS — I10 Essential (primary) hypertension: Secondary | ICD-10-CM | POA: Insufficient documentation

## 2010-10-05 DIAGNOSIS — M7989 Other specified soft tissue disorders: Secondary | ICD-10-CM | POA: Insufficient documentation

## 2010-10-05 DIAGNOSIS — M79609 Pain in unspecified limb: Secondary | ICD-10-CM | POA: Insufficient documentation

## 2010-10-05 DIAGNOSIS — I82401 Acute embolism and thrombosis of unspecified deep veins of right lower extremity: Secondary | ICD-10-CM

## 2010-10-05 HISTORY — DX: Acute embolism and thrombosis of unspecified deep veins of right lower extremity: I82.401

## 2010-10-05 LAB — BASIC METABOLIC PANEL
GFR calc non Af Amer: 40 mL/min — ABNORMAL LOW (ref >60)
Glucose, Bld: 255 mg/dL — ABNORMAL HIGH (ref 70–99)
Potassium: 3.4 mEq/L — ABNORMAL LOW (ref 3.5–5.1)
Sodium: 139 mEq/L (ref 135–145)

## 2010-10-05 LAB — PROTIME-INR: Prothrombin Time: 13.8 seconds (ref 11.6–15.2)

## 2010-10-05 LAB — DIFFERENTIAL
Basophils Relative: 0 % (ref 0–1)
Lymphocytes Relative: 19 % (ref 12–46)
Lymphs Abs: 1.4 10*3/uL (ref 0.7–4.0)
Monocytes Relative: 14 % — ABNORMAL HIGH (ref 3–12)
Neutro Abs: 4.9 10*3/uL (ref 1.7–7.7)
Neutrophils Relative %: 66 % (ref 43–77)

## 2010-10-05 LAB — CBC
Hemoglobin: 11.2 g/dL — ABNORMAL LOW (ref 12.0–15.0)
MCH: 28.7 pg (ref 26.0–34.0)
MCV: 88.5 fL (ref 78.0–100.0)
RBC: 3.9 MIL/uL (ref 3.87–5.11)

## 2010-10-06 ENCOUNTER — Ambulatory Visit (HOSPITAL_COMMUNITY): Payer: Medicare Other

## 2010-10-06 LAB — CBC
Hemoglobin: 10.4 g/dL — ABNORMAL LOW (ref 12.0–15.0)
MCH: 29.2 pg (ref 26.0–34.0)
RBC: 3.56 MIL/uL — ABNORMAL LOW (ref 3.87–5.11)
WBC: 7.6 10*3/uL (ref 4.0–10.5)

## 2010-10-06 LAB — DIFFERENTIAL
Basophils Absolute: 0 10*3/uL (ref 0.0–0.1)
Basophils Relative: 0 % (ref 0–1)
Monocytes Relative: 15 % — ABNORMAL HIGH (ref 3–12)
Neutro Abs: 4.1 10*3/uL (ref 1.7–7.7)
Neutrophils Relative %: 54 % (ref 43–77)

## 2010-10-06 LAB — BASIC METABOLIC PANEL
Chloride: 107 mEq/L (ref 96–112)
GFR calc Af Amer: >60 mL/min (ref >60)
Potassium: 3.4 mEq/L — ABNORMAL LOW (ref 3.5–5.1)
Sodium: 142 mEq/L (ref 135–145)

## 2010-10-06 LAB — GLUCOSE, CAPILLARY
Glucose-Capillary: 85 mg/dL (ref 70–99)
Glucose-Capillary: 94 mg/dL (ref 70–99)

## 2010-10-06 LAB — PROTIME-INR
INR: 1.14 (ref 0.00–1.49)
Prothrombin Time: 14.8 seconds (ref 11.6–15.2)

## 2010-10-18 NOTE — Discharge Summary (Signed)
[name], [name]                ACCOUNT NO.:  [id number]  MEDICAL RECORD NO.:  [id number]  LOCATION:  A302                          FACILITY:  [facility]  PHYSICIAN:  Peggye Pitt, M.D. DATE OF BIRTH:  05/28/1945  DATE OF ADMISSION:  10/05/2010 DATE OF DISCHARGE:  07/06/2012LH                              DISCHARGE SUMMARY   PRIMARY CARE PHYSICIAN:  Corrie Mckusick, MD  DISCHARGE DIAGNOSES: 1. Right lower extremity deep venous thrombosis. 2. Type 2 diabetes. 3. Hypertension. 4. Gastroesophageal reflux disease.  DISCHARGE MEDICATIONS: 1. Lovenox 130 mg subcu daily to take until INR greater than 2 for 2     days. 2. Warfarin 5 mg daily to take today and tomorrow and then further     warfarin dose will be adjusted according to INRs by Dr. Lamar Lewis     office. 3. Amaryl 1 mg daily. 4. Dexilant 60 mg daily. 5. Fish oil 1200 mg daily. 6. Flaxseed 1300 mg daily. 7. Ibuprofen 200 mg every 6 h. as needed for pain. 8. Multivitamin 1 tablet daily. 9. Nifedipine XR 60 mg daily. 10.Tradjenta 5 mg daily. 11.Vitamin C 1000 mg daily. 12.Vitamin D3 1000 units daily. 13.Zocor 20 mg daily. 14.She has been instructed to immediately discontinue her estradiol.  CONSULTATION THIS HOSPITALIZATION:  None.  IMAGES AND PROCEDURES:  Right lower extremity Doppler on October 05, 2010, that showed DVT in the popliteal vein.  DISPOSITION AND FOLLOWUP:  [name] will hopefully be discharged home later today in stable and improved condition.  Our case management office is working up on setting a home health nurse to come out to her house on a daily basis to perform the insulin injections and to draw her INR on Sunday which will be 2 days after discharge.  HISTORY AND PHYSICAL:  For details, please see dictation on October 05, 2010, by Dr. Rito Ehrlich, but in brief, [name] is a pleasant 65 year old African American lady with a history of hypertension, diabetes and hyperlipidemia who presented to the  hospital with right lower extremity pain and swelling.  On September 15, 2010, she left for [place] in [place], [place] who has a 4-hour drive.  She subsequently noted that after her return she started developing pain and swelling of her right leg.  She went and saw Dr. Regino Schultze on day of admission who was concerned enough to get a Doppler after results were positive for DVT.  She was referred to the emergency department for evaluation and subsequently were called to admit her.  HOSPITAL COURSE BY PROBLEM:  Acute right lower extremity DVT.  She has been started on Coumadin and Lovenox.  Her goal INR will be between 2 and 3 for at least 3 months.  She has no chest pain or shortness of breath to indicate need to scan her chest for possibly a PE.  She will also need to continue Lovenox 130 mg subcu a day for at least 5 days or until her INR has been therapeutic for over 2 days.  This appears to have been a provoked DVT.  She is a smoker.  She was on hormone replacement therapy and she also had a recent a long  car trip.  She has been instructed to discontinue use of her hormone replacement therapy immediately.  She has also been given information on smoking cessation of which she appears to not be very interested at this time.  I see no need to screen her for hypercoagulable disorders at this time.  Rest of her chronic medications have been stable.  Her home medications have not been altered.  VITALS ON DAY OF DISCHARGE:  Blood pressure 136/68, heart rate 83, respirations 18, sats were 94% on room air and temp 98.9.     Peggye Pitt, M.D.     EH/MEDQ  D:  10/06/2010  T:  10/07/2010  Job:  [id number]  cc:   Corrie Mckusick, M.D. Fax: [phone]  Electronically Signed by Peggye Pitt M.D. on 10/18/2010 07:43:10 AM

## 2010-10-24 NOTE — H&P (Signed)
[name], [name]                ACCOUNT NO.:  [id number]  MEDICAL RECORD NO.:  [id number]  LOCATION:  A302                          FACILITY:  [facility]  PHYSICIAN:  Osvaldo Shipper, MD     DATE OF BIRTH:  [birth year]  DATE OF ADMISSION:  10/05/2010 DATE OF DISCHARGE:  LH                             HISTORY & PHYSICAL   PRIMARY CARE PHYSICIAN:  Corrie Mckusick, MD  ADMISSION DIAGNOSES: 1. Right lower extremity deep venous thrombosis. 2. Pain with impaired mobility. 3. Diabetes type 2. 4. Hypertension. 5. Recent road trip. 6. On hormonal replacement therapy.  CHIEF COMPLAINT:  Right leg pain.  HISTORY OF PRESENT ILLNESS:  The patient is a 65 year old African American female with a past medical history of diabetes, hypertension who was on vacation on June 15 for a week.  She went to [place], [place].  It was a 4-hour drive.  She took breaks during the drive and before going on the vacation, the patient was put on ACTOplus met for her diabetes few weeks prior by Dr. Phillips Odor.  She had significant diarrhea with this medication.  She took fluids in order to prevent from significant diarrhea, significant dehydration.  She called her doctor. The dose of her medications were reduced.  The patient then continued to have these symptoms and then finally the medication was discontinued about 10 days 2 weeks ago.  She was also started on Amaryl.  While she was on vacation, she slipped and fell.  Denies any syncopal episode. She hurt her right knee.  She scraped it.  She went to the pharmacist and treated it with the first aid kit.  She laid down on the bed all day long after she fell and then she started noticing swelling in the right side after she came back from vacation and actually the first of this week.  The swelling started worsening, the pain increased, she could not weight bear.  The pain was 6/10 in intensity and then she called Dr. Lamar Blinks office today and was seen by Dr.  Regino Schultze who sent her for Doppler study which came back positive for a DVT and then she was referred for admission.  Apparently this case was discussed by Dr. Rosalia Hammers, the ED physician, with PCP, Dr. Regino Schultze, and he wanted this patient to be admitted.  The patient also was adamant that she had to be admitted because she was not comfortable taking Lovenox at home.  The patient denies taking any antibiotics recently.  MEDICATIONS AT HOME: 1. Nifedipine 60 mg daily. 2. Glimepiride 1 mg twice daily. 3. Tradjenta 5 mg once a day. 4. Zocor 20 mg daily. 5. Estradiol 0.5 mg once a daily. 6. Dexilant 60 mg once a daily. 7. Aspirin 81 mg once a day. 8. Flaxseed once a day. 9. Vitamin C once a day. 10.Fish oil once a day. 11.Multivitamins once a day. 12.Vitamin D3 once a day and she was taken off of her ACTOplus met     about a week ago.  ALLERGIES:  HYDROCODONE which causes itching, VICODIN same, and ACE INHIBITORS caused apparently angioedema.  PAST MEDICAL HISTORY:  Positive for diabetes, hypertension, acid reflux. No history  of coronary artery disease.  She has had a hysterectomy, tonsillectomy, cervical disk surgery, and a right knee surgery in the past.  SOCIAL HISTORY:  Lives in [place] with her family.  She is retired from [employer] and [employer].  She smokes 1 pack of cigarettes on a daily basis.  No alcohol use.  No illicit drug use.  FAMILY HISTORY:  Positive for diabetes, arthritis, hypertension.  No history of any clotting disorder in the family.  Also denies any history of blood clots in the past.  REVIEW OF SYSTEMS:  GENERAL:  Positive for weakness, malaise.  HEENT: Unremarkable.  CARDIOVASCULAR:  Unremarkable.  RESPIRATORY: Unremarkable.  Denies any chest pain, shortness of breath.  GI:  As in HPI.  Positive for diarrhea.  GU:  Unremarkable.  NEUROLOGIC: Unremarkable.  PSYCHIATRIC:  Unremarkable.  MUSCULOSKELETAL:  As in HPI. Other systems reviewed and found to be  negative.  PHYSICAL EXAMINATION:  VITAL SIGNS:  Temperature 98.1, blood pressure 126/63, heart rate 99, respiratory rate 18, saturation 100% on room air. GENERAL:  This is a 65 obese African American female in no distress, very anxious. HEENT:  Head is normocephalic, atraumatic.  Pupils are equal reacting. No pallor, no icterus.  Oral mucous membranes moist.  No oral lesions are noted. NECK: Soft and supple.  No thyromegaly is appreciated.  No cervical, supraclavicular, or inguinal lymphadenopathy is present. LUNGS:  Clear to auscultation bilaterally with no wheezing, rales, or rhonchi. CARDIOVASCULAR:  S1, S2 is normal, regular.  No S3, S4 rubs, murmurs, or bruits. ABDOMEN:  Soft, nontender, nondistended.  Bowel sounds are present.  No masses or organomegaly is appreciated. GU:  Deferred. MUSCULOSKELETAL:  Her right leg is swollen compared to the left.  It is tender to palpation posteriorly.  Peripheral pulses are palpable.  The knee has healed wounds.  There is good range of motion of the right knee. SKIN:  Does not reveal any rashes.  There is mild erythema in the right lower extremities and it is warm to touch as well.  LABORATORY DATA:  White cell count is 7.4, hemoglobin 11.2, platelet count is 337,000, INR is 1.0.  Her electrolytes show potassium of 3.4, glucose was 255, BUN 9, creatinine 1.32.  Review of her previous labs show that her BUN, creatinine maybe close to baseline.  IMAGING STUDIES:  She had a venous Doppler of the right lower extremity which showed DVT in the popliteal vein.  ASSESSMENT:  This is a 65 year old African American female with diabetes, hypertension who is on hormonal replacement therapy who went on a road trip for vacation.  It was a 4-hour drive each way.  She fell there while on vacation and was on the bed for 1 full day.  All these resulted probably in her DVT.  There is no other evidence to suggest hypercoagulability at this time.  PLAN: 1.  Right lower extremity DVT in the popliteal vein will be treated     with Lovenox, Coumadin.  We did try to tell the patient to consider     outpatient treatment, however, because of her significant pain and     her reluctance to take the injections tonight at home, she will be     observed in the hospital overnight.  We will involve home health in     the morning.  Hopefully she can be treated with once daily dosing     of Lovenox.  She will need to be off of her aspirin.  I would also  strongly recommend that she come off of her estradiol while she has     this DVT and should consider not taking that medicine anymore. 2. Diabetes with recent side effect to metformin in the form of     diarrhea.  We will check her HbA1c.  We will put her on a sliding     scale.  We will continue with her Amaryl, hold off on the Tradjenta     for now. 3. Hypertension.  Continue with nifedipine. 4. Hypokalemia will be repleted. 5. She tells me that one of her kidney does not work for unclear     reasons that could explain mildly elevated creatinine.  We will     monitor this closely as well.  Her GFR was 49. 6. Further management decisions will depend on results of further     testing and patient's response to treatment for tobacco abuse.     Nicotine patch will be prescribed.  She was strongly counseled to     avoid smoking, especially considering her DVT. 7. Diarrhea due to metformin.  We will give her Imodium as needed.  No     suspicion for C. diff at this time.   Osvaldo Shipper, MD     GK/MEDQ  D:  10/05/2010  T:  10/05/2010  Job:  [id number]  cc:   Corrie Mckusick, M.D. Fax: [phone]  Electronically Signed by Osvaldo Shipper MD on 10/24/2010 03:48:08 PM

## 2010-10-31 ENCOUNTER — Other Ambulatory Visit (HOSPITAL_COMMUNITY): Payer: Self-pay | Admitting: Family Medicine

## 2010-10-31 ENCOUNTER — Ambulatory Visit (HOSPITAL_COMMUNITY): Admission: RE | Admit: 2010-10-31 | Payer: Medicare Other | Source: Ambulatory Visit

## 2010-10-31 DIAGNOSIS — E785 Hyperlipidemia, unspecified: Secondary | ICD-10-CM

## 2010-10-31 DIAGNOSIS — I82409 Acute embolism and thrombosis of unspecified deep veins of unspecified lower extremity: Secondary | ICD-10-CM

## 2010-10-31 DIAGNOSIS — E119 Type 2 diabetes mellitus without complications: Secondary | ICD-10-CM

## 2010-10-31 DIAGNOSIS — I10 Essential (primary) hypertension: Secondary | ICD-10-CM

## 2010-11-02 ENCOUNTER — Ambulatory Visit (HOSPITAL_COMMUNITY)
Admission: RE | Admit: 2010-11-02 | Discharge: 2010-11-02 | Disposition: A | Discharge status: Home / Self Care | Payer: Medicare Other | Source: Ambulatory Visit | Attending: Family Medicine | Admitting: Family Medicine

## 2010-11-02 ENCOUNTER — Other Ambulatory Visit (HOSPITAL_COMMUNITY): Payer: Self-pay | Admitting: Family Medicine

## 2010-11-02 DIAGNOSIS — I82409 Acute embolism and thrombosis of unspecified deep veins of unspecified lower extremity: Secondary | ICD-10-CM

## 2010-11-02 DIAGNOSIS — Z7901 Long term (current) use of anticoagulants: Secondary | ICD-10-CM | POA: Insufficient documentation

## 2010-11-13 ENCOUNTER — Encounter (HOSPITAL_COMMUNITY): Payer: Self-pay

## 2010-11-13 ENCOUNTER — Encounter (HOSPITAL_COMMUNITY): Payer: Medicare Other | Attending: Oncology

## 2010-11-13 VITALS — BP 124/77 | HR 88 | Temp 98.7°F | Wt 198.0 lb

## 2010-11-13 DIAGNOSIS — R197 Diarrhea, unspecified: Secondary | ICD-10-CM

## 2010-11-13 DIAGNOSIS — I824Y9 Acute embolism and thrombosis of unspecified deep veins of unspecified proximal lower extremity: Secondary | ICD-10-CM | POA: Insufficient documentation

## 2010-11-13 DIAGNOSIS — R0602 Shortness of breath: Secondary | ICD-10-CM

## 2010-11-13 DIAGNOSIS — Z87891 Personal history of nicotine dependence: Secondary | ICD-10-CM

## 2010-11-13 DIAGNOSIS — I82409 Acute embolism and thrombosis of unspecified deep veins of unspecified lower extremity: Secondary | ICD-10-CM | POA: Insufficient documentation

## 2010-11-13 DIAGNOSIS — R634 Abnormal weight loss: Secondary | ICD-10-CM

## 2010-11-13 LAB — DIFFERENTIAL
Eosinophils Absolute: 0.1 10*3/uL (ref 0.0–0.7)
Eosinophils Relative: 2 % (ref 0–5)
Lymphs Abs: 2 10*3/uL (ref 0.7–4.0)
Monocytes Absolute: 0.6 10*3/uL (ref 0.1–1.0)

## 2010-11-13 LAB — PROTIME-INR
INR: 1.58 — ABNORMAL HIGH (ref 0.00–1.49)
Prothrombin Time: 19.2 seconds — ABNORMAL HIGH (ref 11.6–15.2)

## 2010-11-13 LAB — CBC
HCT: 41.8 % (ref 36.0–46.0)
MCH: 28.8 pg (ref 26.0–34.0)
MCV: 89.3 fL (ref 78.0–100.0)
Platelets: 263 10*3/uL (ref 150–400)
RBC: 4.68 MIL/uL (ref 3.87–5.11)
RDW: 15.7 % — ABNORMAL HIGH (ref 11.5–15.5)

## 2010-11-13 LAB — COMPREHENSIVE METABOLIC PANEL
ALT: 15 U/L (ref 0–35)
CO2: 25 mEq/L (ref 19–32)
Calcium: 10.8 mg/dL — ABNORMAL HIGH (ref 8.4–10.5)
Creatinine, Ser: 1.15 mg/dL — ABNORMAL HIGH (ref 0.50–1.10)
GFR calc Af Amer: 57 mL/min — ABNORMAL LOW (ref >60)
GFR calc non Af Amer: 47 mL/min — ABNORMAL LOW (ref >60)
Glucose, Bld: 127 mg/dL — ABNORMAL HIGH (ref 70–99)
Sodium: 144 mEq/L (ref 135–145)
Total Protein: 8.2 g/dL (ref 6.0–8.3)

## 2010-11-13 LAB — APTT: aPTT: 32 seconds (ref 24–37)

## 2010-11-13 MED ORDER — ENOXAPARIN SODIUM 150 MG/ML ~~LOC~~ SOLN: 1.5000 mg/kg | Freq: Every day | SUBCUTANEOUS | Status: AC

## 2010-11-13 NOTE — Patient Instructions (Signed)
[facility]  Discharge Instructions  RECOMMENDATIONS MADE BY THE CONSULTANT AND ANY TEST RESULTS WILL BE SENT TO YOUR REFERRING DOCTOR.   EXAM FINDINGS BY MD TODAY AND SIGNS AND SYMPTOMS TO REPORT TO CLINIC OR PRIMARY MD: Need to cover you with lovenox until coumadin level is therapeutic  MEDICATIONS PRESCRIBED: lovenox as directed. Follow label directions     SPECIAL INSTRUCTIONS/FOLLOW-UP: Return to Clinic on Friday and Monday as scheduled CT scan as scheduled Friday.   I acknowledge that I have been informed and understand all the instructions given to me and received a copy. I do not have any more questions at this time, but understand that I may call the Specialty Clinic at [facility] at [phone] during business hours should I have any further questions or need assistance in obtaining follow-up care.    __________________________________________  _____________  __________ Signature of Patient or Authorized Representative            Date                   Time    __________________________________________ Nurse's Signature

## 2010-11-14 LAB — LUPUS ANTICOAGULANT PANEL
Lupus Anticoagulant: NOT DETECTED
dRVVT Incubated 1:1 Mix: 39 secs (ref 34.1–42.2)

## 2010-11-14 LAB — PROTEIN S ACTIVITY: Protein S Activity: 35 % — ABNORMAL LOW (ref 69–129)

## 2010-11-14 LAB — PROTEIN C ACTIVITY: Protein C Activity: 66 % — ABNORMAL LOW (ref 75–133)

## 2010-11-14 NOTE — Progress Notes (Signed)
CC:   Corrie Mckusick, M.D.  REFERRING PHYSICIAN:  Dr. Corrie Mckusick  IDENTIFYING STATEMENT:  The patient is a 65 year old woman seen at the request of Dr. Phillips Odor with deep vein thrombosis.  HISTORY OF PRESENT ILLNESS:  Following a fall on her right knee during a recent vacation, the patient presented with right lower leg swelling with pain.  She had at that time denied chest pain but noticed some slight shortness of breath.  She has also noted some weight loss.  She Also had GI complaints of diarrhea which was felt to be due to a new  diabetic medication which has since been discontinued.  She was evaluated  with bilateral lower extremity Dopplers in August 2012 that showed thrombosis in the distal popliteal vein of the right leg. The patient was admitted overnight and treated with Lovenox which subsequently was bridged to warfarin. hypercoagulable work up was not performed. The patient was asked to discontinue  hormone replacement therapy which she had been taking for a number of years.   Despite taking increasing doses of warfarin which was later switched to Coumadin, her INR remains subtherapeutic.  She has been off Lovenox for 2 weeks now.  She denies bruising or bleeding. She denies lower extremity swelling.  bruising or bleeding. The patient also admits to compliance. She also watches what she eats and avoids green leafy vegetables. She has been taken off most of her medications.    PAST MEDICAL HISTORY:  Hypertension, diabetes, hypercholesterolemia, arthritis, and atrophic right kidney.  ALLERGIES:  ACE inhibitors, celecoxib, and Vicodin.  MEDICATIONS:  Vitamin D3 a 1000 units daily, nifedipine 60 mg daily, Tylenol 500 mg as needed, Dexilant 60 mg daily, Lomotil 1 tablet every 4 hours as needed for diarrhea, glimepiride 1 mg daily, Tradjenta  5 mg daily, Zocor 20 mg daily, ascorbic acid 500 of mg daily, and Coumadin is currently 2.5 mg daily.  SOCIAL HISTORY:  The patient is  married with 3 children.  [place]  She is retired from [employer] and worked in various jobs Scientist, water quality.  She is a long-term smoker, smoking 1 pack a day for 50 years.  She denies alcohol use.  HEALTH MAINTENANCE:  She is up-to-date with mammograms.  A colonoscopy was performed in May of this year which she reports as being unremarkable.  FAMILY HISTORY:  Is negative for venous thrombosis.  Also negative for oncologic or hematologic malignancies.  REVIEW OF SYSTEMS:  Constitutional:  Denies fever, chills, night sweats, or anorexia but admits to undocumented weight loss.  GI:  Admits to intermittent diarrhea managed with Lomotil.  Denies melena, hematochezia.  Denies constipation.  Denies abdominal pain.  Denies nausea or vomiting.  Cardiovascular:  Denies chest pain but admits to shortness of breath on exertion.  Denies cough, hemoptysis, or wheeze. CVS:  Denies chest pain, PND, orthopnea, or ankle swelling.  Skin: Denies bruising or bleeding.  Musculoskeletal:  Denies joint aches, muscle pains.  CNS and PNS:  Denies headaches, vision changes, hearing loss, or extremity weakness.   PHYSICAL EXAMINATION:  General:  The patient is alert and oriented x3. Vital signs:  Pulse 88, blood pressure 124/77, temperature 98.7, respirations 18, and weight 198 pounds.  HEENT:  Head is atraumatic, normocephalic.  Sclerae are anicteric.  Mouth moist without thrush. Neck:  Supple without thyromegaly.  Chest:  A few scattered rhonchi in both bases, otherwise clear.  Cardiovascular exam:  First and second heart sounds present.  No added sounds or murmurs.  Abdomen:  Soft, nontender, and bowel sounds present.  Extremities:  Mild right lower extremity swelling.  Otherwise, no calf pain.  Pulses were present in both extremities.  No calf tenderness in left lower extremity.  CNS and PNS:  Notes no focal neurological deficit.  Plantars are downgoing. Reflexes are  present and symmetrical. Skin without bruising or bleeding.   IMPRESSION AND PLAN:  [name] is a 65-year woman who was recently diagnosed with right lower extremity deep vein thrombosis of the popliteal vein following trauma to right knee.  She was also on hormonal replacement therapy which she has since discontinued.  [name] is a long-term smoker.  Has some documented weight loss with shortness of breath on extreme exertion.  Otherwise, is in good health.  She states that she is compliant with warfarin.  However, her INR has been subtherapeutic despite increasing doses of late.  So with this said, I will have her obtain a PT, PTT, and INR level in the clinic today to see where we stand up. We will also have her obtain a warfarin level. If her INR is remains subtherapeutic I will suggest she begins Lovenox until we can get her INR therapeutic and aim at 2 - 3. We reviewed her medication list and there are no drugs that contribute to drug to drug interaction with coumadin however I have asked her to discontinue dexilent for the time being. She also denies taking any over-the-counter medications or herbal remedies.  I also recommended we obtain a CT scan of the chest, abdomen,and pelvis to rule out occult malignancy. Because,she is on Coumadin we can only perform a limited hypercoagulable workup that includes lupus anticoagulant, prothrombin gene mutation,  factor V mutation, beta-2 glycoprotein, and cardiolipin antibodies.  She follows up for close monitoring to discuss results of scans.      ______________________________ Laurice Record, M.D. LIO/MEDQ  D:  11/13/2010  T:  11/13/2010  Job:  [id number]

## 2010-11-15 LAB — CARDIOLIPIN ANTIBODIES, IGG, IGM, IGA
Anticardiolipin IgA: 7 APL U/mL — ABNORMAL LOW (ref <22)
Anticardiolipin IgG: 4 GPL U/mL — ABNORMAL LOW (ref <23)
Anticardiolipin IgM: 3 MPL U/mL — ABNORMAL LOW (ref <11)

## 2010-11-15 LAB — BETA-2-GLYCOPROTEIN I ABS, IGG/M/A: Beta-2-Glycoprotein I IgA: 9 A Units (ref <20)

## 2010-11-15 LAB — FACTOR 5 LEIDEN

## 2010-11-17 ENCOUNTER — Encounter (HOSPITAL_COMMUNITY): Payer: Medicare Other

## 2010-11-17 ENCOUNTER — Other Ambulatory Visit (HOSPITAL_COMMUNITY): Payer: Self-pay | Admitting: Oncology

## 2010-11-17 ENCOUNTER — Telehealth (HOSPITAL_COMMUNITY): Payer: Self-pay | Admitting: *Deleted

## 2010-11-17 ENCOUNTER — Ambulatory Visit (HOSPITAL_COMMUNITY)
Admission: RE | Admit: 2010-11-17 | Discharge: 2010-11-17 | Disposition: A | Discharge status: Home / Self Care | Payer: Medicare Other | Source: Ambulatory Visit | Attending: Oncology | Admitting: Oncology

## 2010-11-17 DIAGNOSIS — Z87891 Personal history of nicotine dependence: Secondary | ICD-10-CM

## 2010-11-17 DIAGNOSIS — I82409 Acute embolism and thrombosis of unspecified deep veins of unspecified lower extremity: Secondary | ICD-10-CM

## 2010-11-17 DIAGNOSIS — J438 Other emphysema: Secondary | ICD-10-CM | POA: Insufficient documentation

## 2010-11-17 DIAGNOSIS — R634 Abnormal weight loss: Secondary | ICD-10-CM | POA: Insufficient documentation

## 2010-11-17 DIAGNOSIS — N269 Renal sclerosis, unspecified: Secondary | ICD-10-CM | POA: Insufficient documentation

## 2010-11-17 DIAGNOSIS — R0602 Shortness of breath: Secondary | ICD-10-CM

## 2010-11-17 DIAGNOSIS — N2889 Other specified disorders of kidney and ureter: Secondary | ICD-10-CM

## 2010-11-17 DIAGNOSIS — R9389 Abnormal findings on diagnostic imaging of other specified body structures: Secondary | ICD-10-CM | POA: Insufficient documentation

## 2010-11-17 DIAGNOSIS — R197 Diarrhea, unspecified: Secondary | ICD-10-CM | POA: Insufficient documentation

## 2010-11-17 LAB — PROTIME-INR
INR: 1.44 (ref 0.00–1.49)
Prothrombin Time: 17.8 seconds — ABNORMAL HIGH (ref 11.6–15.2)

## 2010-11-17 MED ORDER — IOHEXOL 350 MG/ML SOLN
100.0000 mL | Freq: Once | INTRAVENOUS | Status: AC | PRN
  Administered 2010-11-17: 100 mL via INTRAVENOUS

## 2010-11-17 NOTE — Telephone Encounter (Signed)
Left message for pt to increase coumadin to 25 mg and call us for appt for pt level monday

## 2010-11-17 NOTE — Progress Notes (Signed)
Addended by: Edythe Lynn A on: 11/17/2010 02:39 PM   Modules accepted: Orders

## 2010-11-17 NOTE — Progress Notes (Signed)
Labs drawn today for pt.  Patient on 22.5mg  and lovenox shot qd.  Call patient [phone]

## 2010-11-20 ENCOUNTER — Encounter (HOSPITAL_COMMUNITY): Payer: Medicare Other

## 2010-11-20 LAB — PROTIME-INR: INR: 1.46 (ref 0.00–1.49)

## 2010-11-20 NOTE — Progress Notes (Signed)
Labs drawn today for pt.  Patient on 25mg   coud qd.  & 1 lovenox shot qd.  Call patient at [phone] home/ [phone] cell.

## 2010-11-21 LAB — PROTEIN C, TOTAL: Protein C, Total: 87 % (ref 72–160)

## 2010-11-21 NOTE — Progress Notes (Signed)
According to chart review it is still in process... Go to chart review, then click on the lab tab...Marland KitchenMarland Kitchen

## 2010-11-23 ENCOUNTER — Ambulatory Visit (HOSPITAL_COMMUNITY)
Admission: RE | Admit: 2010-11-23 | Discharge: 2010-11-23 | Disposition: A | Discharge status: Home / Self Care | Payer: Medicare Other | Source: Ambulatory Visit | Attending: Oncology | Admitting: Oncology

## 2010-11-23 ENCOUNTER — Encounter (HOSPITAL_BASED_OUTPATIENT_CLINIC_OR_DEPARTMENT_OTHER): Payer: Medicare Other

## 2010-11-23 ENCOUNTER — Other Ambulatory Visit (HOSPITAL_COMMUNITY): Payer: Self-pay | Admitting: *Deleted

## 2010-11-23 DIAGNOSIS — I82409 Acute embolism and thrombosis of unspecified deep veins of unspecified lower extremity: Secondary | ICD-10-CM

## 2010-11-23 DIAGNOSIS — N289 Disorder of kidney and ureter, unspecified: Secondary | ICD-10-CM | POA: Insufficient documentation

## 2010-11-23 DIAGNOSIS — N2889 Other specified disorders of kidney and ureter: Secondary | ICD-10-CM

## 2010-11-23 DIAGNOSIS — Q619 Cystic kidney disease, unspecified: Secondary | ICD-10-CM | POA: Insufficient documentation

## 2010-11-23 LAB — PROTIME-INR: INR: 1.65 — ABNORMAL HIGH (ref 0.00–1.49)

## 2010-11-23 MED ORDER — GADOBENATE DIMEGLUMINE 529 MG/ML IV SOLN
15.0000 mL | Freq: Once | INTRAVENOUS | Status: AC | PRN
  Administered 2010-11-23: 15 mL via INTRAVENOUS

## 2010-11-23 NOTE — Progress Notes (Signed)
Labs drawn today for pt.  Patient on 25mg  coud.  Call patient at [phone].

## 2010-11-24 ENCOUNTER — Telehealth (HOSPITAL_COMMUNITY): Payer: Self-pay

## 2010-11-24 NOTE — Telephone Encounter (Signed)
Good questions-these cysts can occur without coumadin,etc and they are not to be worried about.

## 2010-11-24 NOTE — Telephone Encounter (Signed)
Patient notified.  Has many questions and encouraged to write down questions and bring them with her when she comes in for next appointment.

## 2010-11-24 NOTE — Telephone Encounter (Signed)
Results of MR called to patient.  She wants to know if this is anything to worry about and if the fact she's on coumadin 26mg  daily and lovenox 135mg  daily is what has caused this to occur.  Can be reached @ [phone].

## 2010-11-27 ENCOUNTER — Telehealth (HOSPITAL_COMMUNITY): Payer: Self-pay | Admitting: *Deleted

## 2010-11-27 ENCOUNTER — Encounter (HOSPITAL_COMMUNITY): Payer: Medicare Other

## 2010-11-27 ENCOUNTER — Other Ambulatory Visit (HOSPITAL_COMMUNITY): Payer: Self-pay | Admitting: *Deleted

## 2010-11-27 DIAGNOSIS — I82409 Acute embolism and thrombosis of unspecified deep veins of unspecified lower extremity: Secondary | ICD-10-CM

## 2010-11-27 LAB — PROTIME-INR: Prothrombin Time: 20.1 seconds — ABNORMAL HIGH (ref 11.6–15.2)

## 2010-11-27 NOTE — Progress Notes (Signed)
Labs drawn today for pt.  Patient on 26mg  of coud and one lovenox shot a day.  Call patient at [phone]

## 2010-11-27 NOTE — Telephone Encounter (Signed)
Message copied by Dennie Maizes on Mon Nov 27, 2010  4:17 PM ------      Message from: Mariel Sleet, ERIC S      Created: Mon Nov 27, 2010  4:03 PM       Increase her to 29 mg and INR this Friday and Tuesday.Tell her I spoke with one of my coag colleagues at [facility] and he says we are on the right path.

## 2010-11-27 NOTE — Telephone Encounter (Signed)
Message left on pt's answering machine as below. 

## 2010-11-29 ENCOUNTER — Encounter (HOSPITAL_COMMUNITY): Payer: Self-pay | Admitting: Oncology

## 2010-11-29 ENCOUNTER — Encounter (HOSPITAL_BASED_OUTPATIENT_CLINIC_OR_DEPARTMENT_OTHER): Payer: Medicare Other | Admitting: Oncology

## 2010-11-29 VITALS — BP 125/77 | HR 87 | Temp 98.5°F | Wt 202.4 lb

## 2010-11-29 DIAGNOSIS — I82409 Acute embolism and thrombosis of unspecified deep veins of unspecified lower extremity: Secondary | ICD-10-CM

## 2010-11-29 DIAGNOSIS — E7210 Disorders of sulfur-bearing amino-acid metabolism, unspecified: Secondary | ICD-10-CM

## 2010-11-29 DIAGNOSIS — R7983 Abnormal findings of blood amino-acid level: Secondary | ICD-10-CM | POA: Insufficient documentation

## 2010-11-29 HISTORY — DX: Abnormal findings of blood amino-acid level: R79.83

## 2010-11-29 MED ORDER — FOLIC ACID 1 MG PO TABS: 1.0000 mg | ORAL_TABLET | Freq: Two times a day (BID) | ORAL | Status: AC

## 2010-11-29 NOTE — Progress Notes (Signed)
Colette Ribas, MD [address] [place] [address]  1. Homocysteinemia     CURRENT THERAPY: 29 mg of Coumadin daily  INTERVAL HISTORY: [name] 65 y.o. female returns for  regular  visit for followup of DVT of right LE.  The patient reports that her LE swelling secondary to the DVT has resolved.  She denies any pain in the affect extremity.  She Explains that she is following the advice of all of her providers.  She has limited her leafy green vegetable intact and other vitamin K containing food.  I recommended that she not eliminate these food products, but simply eat a steady diet of them.  She must avoid "binge" eating of foods with Vitamin K.  She reports that she stopped her Dexilant per Dr. Dalene Carrow, but she is to restart that medication because I could not find any evidence that it would modify the absorption rate of Coumadin.  Also, since she has been off that medication, her heart burn has been "horrible."     The patient asks a number of questions regarding her condition.  Dr. Mariel Sleet visited with the patient as well to help explain her homocystinemia condition.  He went over the etiology and pathophysiology of this condition.  He also went over the treatment which includes a good Vitamin B-Complex and Folic acid.    The patient is trying to quite smoking.  She used to smoke 1 pack per day.  She is now on the nicoderm patch and today she only smoked one cigarette.  It appears as though she is making wonderful progress on this front.     Past Medical History  Diagnosis Date  . Hypertension   . Diabetes mellitus     non insulin  . Arthritis   . Kidney atrophy     rt.  . History of blood clots   . Homocysteinemia 11/29/2010    has ARTHRITIS, LEFT KNEE; HIGH BLOOD PRESSURE; Deep vein thrombosis (DVT); and Homocysteinemia on her problem list.     is allergic to ace inhibitors; celecoxib; and hydrocodone-acetaminophen.  Ms. Hodges does not  currently have medications on file.  Past Surgical History  Procedure Date  . Abdominal hysterectomy   . Knee surgery     rt. arthroscopic  . Tonsillectomy   . Thrombectomy / embolectomy axillary artery     lower extremeties  . Hypertension   . Type ll diabetes     Denies any headaches, dizziness, double vision, fevers, chills, night sweats, nausea, vomiting, diarrhea, constipation, chest pain, heart palpitations, shortness of breath, blood in stool, black tarry stool, urinary pain, urinary burning, urinary frequency, hematuria.   PHYSICAL EXAMINATION  Filed Vitals:   11/29/10 1434  BP: 125/77  Pulse: 87  Temp: 98.5 F (36.9 C)    GENERAL:alert, healthy, no distress, well nourished, well developed, comfortable, cooperative and smiling SKIN: skin color, texture, turgor are normal, no rashes or significant lesions HEAD: Normocephalic EYES: normal EARS: External ears normal OROPHARYNX:mucous membranes are moist  NECK: supple, trachea midline LYMPH:  not examined BREAST:not examined LUNGS: clear to auscultation and percussion, with decreased breath sounds throughout HEART: regular rate & rhythm, no murmurs, no gallops, S1 normal and S2 normal ABDOMEN:abdomen soft, non-tender and normal bowel sounds BACK: Back symmetric, no curvature. EXTREMITIES:less then 2 second capillary refill, no joint deformities, effusion, or inflammation, no edema, no skin discoloration, no clubbing, no cyanosis  NEURO: alert & oriented x 3 with fluent  speech, no focal motor/sensory deficits, gait normal    LABORATORY DATA: CBC    Component Value Date/Time   WBC 5.7 11/13/2010 1550   RBC 4.68 11/13/2010 1550   HGB 13.5 11/13/2010 1550   HCT 41.8 11/13/2010 1550   PLT 263 11/13/2010 1550   MCV 89.3 11/13/2010 1550   MCH 28.8 11/13/2010 1550   MCHC 32.3 11/13/2010 1550   RDW 15.7* 11/13/2010 1550   LYMPHSABS 2.0 11/13/2010 1550   MONOABS 0.6 11/13/2010 1550   EOSABS 0.1 11/13/2010 1550   BASOSABS 0.0  11/13/2010 1550      Chemistry      Component Value Date/Time   NA 144 11/13/2010 1550   K 4.5 11/13/2010 1550   CL 104 11/13/2010 1550   CO2 25 11/13/2010 1550   BUN 15 11/13/2010 1550   CREATININE 1.15* 11/13/2010 1550      Component Value Date/Time   CALCIUM 10.8* 11/13/2010 1550   ALKPHOS 117 11/13/2010 1550   AST 14 11/13/2010 1550   ALT 15 11/13/2010 1550   BILITOT 0.3 11/13/2010 1550     Lab Results  Component Value Date   INR 1.68* 11/27/2010   INR 1.65* 11/23/2010   INR 1.46 11/20/2010      RADIOGRAPHIC STUDIES:  11/23/10  *RADIOLOGY REPORT*  Clinical Data: Bilateral renal lesions on CT  MRI ABDOMEN WITH AND WITHOUT CONTRAST  Technique: Multiplanar multisequence MR imaging of the abdomen was  performed both before and after administration of intravenous  contrast.  Contrast: 15 ml Multihance IV  Comparison: CT dated 11/17/2010  Findings: Motion degraded images.  Right renal atrophy. Bilateral renal cysts, some of which are  complicated by hemorrhage, including a 10 mm left lateral  interpolar cyst and 8 mm left posterior interpolar cyst (series  6/image 12).  The indeterminate lesion in the left lower pole on CT reflects a 17  x 16 mm hemorrhagic cyst (series 6/image 20) without enhancement  following contrast administration.  The indeterminate lesion in the right upper pole on CT is poorly  demonstrated on numerous sequences due to motion. However, it  measures 17 x 16 mm and is mildly hyperintense on the precontrast  imaging (series 8/image 64), also compatible with hemorrhage. While  post contrast evaluation is constrained by motion, there is no  convincing evidence of enhancement (for example, series 13/image  67).  Liver, spleen, pancreas, and right adrenal gland are within normal  limits.  Mild nodular thickening of the left adrenal gland with signal loss  on opposed phase imaging, compatible with an adrenal adenoma.  No abdominal ascites.  No suspicious  abdominal lymphadenopathy is seen.  No focal osseous lesions.  IMPRESSION:  Indeterminate lesion in the left lower kidney represents a 1.7 cm  hemorrhagic cyst.  Indeterminate lesion in the right upper kidney also likely reflects  a 1.7 cm hemorrhagic cyst, although evaluation is constrained by  motion degraded images.  Left adrenal adenoma.  Original Report Authenticated By: Charline Bills, M.D.   11/17/10  *RADIOLOGY REPORT*  Clinical Data: History of thrombosis. Smoking. Shortness of  breath. Evaluate for pulmonary embolism.  CT ANGIOGRAPHY OF THE CHEST  Technique: Multidetector CT angiography of the chest was performed  after contrast with bolus timed to evaluate the pulmonary arteries.  Multiplanar CT image reconstructions including MIPs were obtained  to evaluate the vascular anatomy.  Contrast: 100 ml Omnipaque-300  Comparison: Right lower extremity ultrasound. Plain film chest of  09/15/2008. No prior CT.  Findings: Lung  windows demonstrate mild to moderate centrilobular  emphysema. Probable secretions within the trachea.  No suspicious lung nodule or mass.  Soft tissue windows: The quality of this exam for evaluation of  pulmonary embolism is good to excellent. No evidence of pulmonary  embolism.  The thyroid is diffusely prominent, extending minimally into the  lower chest. No dominant lesion identified.  Tortuous thoracic aorta. Heart size upper limits of normal without  pericardial or pleural effusion. Small anterior mediastinal lymph  nodes. No mediastinal or hilar adenopathy. Degenerative versus  less likely remote post-traumatic deformities about numerous  spinous processes. Lower cervical spine fixation.  Review of the MIP images confirms the above findings.  IMPRESSION:  1. No evidence of pulmonary embolism.  2. Centrilobular emphysema without acute finding in the chest.  CT ABDOMEN AND PELVIS WITH CONTRAST  Technique: Multidetector CT imaging of the  abdomen and pelvis was  performed following the standard protocol following the bolus  administration of intravenous contrast.  Findings: Normal liver. Tiny splenule. The proximal stomach  appears thick-walled on image 9, felt to be due to underdistension.  Normal pancreas, gallbladder, biliary tract. The lateral limb of  the left adrenal gland is thickened, without well-defined mass.  Image 18. Normal right adrenal gland.  Left renal lesions. A lower pole left renal lesion measures 49 HU  of 1.3 cm image 33 of series 9. On delayed images, 47 HU.  Moderate to marked right renal atrophy with perirenal edema. There  is wall thickening or enhancement of the right renal pelvis on  image 30 of series 9.  A central upper pole right renal mass measures 1.6 cm and 48 HU on  image 25 of series 9. 50 HU on delayed image 24 of series 14.  Coronal image 45.  Left kidney demonstrates heterogeneous enhancement, including on  delayed series 14. There is scarring in the interpolar left  kidney.  Other too small to characterize lesions within both kidneys. Right  extrarenal pelvis but no hydroureter. No retroperitoneal or  retrocrural adenopathy.  Sigmoid diverticulosis with wall thickening, likely muscular  hypertrophy. No evidence of diverticulitis. Normal terminal ileum  and appendix. Small ileocolic mesenteric lymph nodes. Normal small  bowel without abdominal ascites.  Borderline enlarged 1.0 cm right external iliac node on image 70.  Normal urinary bladder. Hysterectomy. No adnexal mass or  significant free pelvic fluid. Injection sites create edema within  the anterior abdominal subcutaneous fat. Enthesopathic change at  the ischial tuberosities. Partial degenerative fusion of the right  sacroiliac joint. Transitional right-sided lumbosacral vertebral  body.  IMPRESSION:  1. No acute process in the abdomen or pelvis.  2. Bilateral indeterminate renal lesions. These could represent  complex  cysts or solid neoplasms. Pre and postcontrast imaging is  recommended. MRI is preferred, if patient can hold breath and  follow directions. These results will be called to the ordering  clinician or representative by the Radiologist Assistant, and  communication documented in the PACS Dashboard.  3. Suspect bilateral urinary tract infection/pyelonephritis.  Right-sided epithelial enhancement within the renal pelvis and  heterogeneous left renal enhancement.  4. Moderate right renal atrophy.  5. Left adrenal thickening without well-defined mass. Recommend  attention on follow-up imaging.  6. Borderline right external iliac adenopathy, likely reactive.  Original Report Authenticated By: Consuello Bossier, M.D.     ASSESSMENT:  1. R LE DVT 2. Homocystinemia    PLAN:  1. Restart Dexilant as prescribed by PCP 2. 1 mg Folic Acid  BID 3. B-Complex multivitamin daily 4. Return in 2 months for follow-up 5. Will continue monitoring PT/INRs and adjust anticoagulation as necessary. 6. I personally reviewed and went over radiographic studies with the patient. 7.  I personally reviewed the patient's lab work with the patient    All questions were answered. The patient knows to call the clinic with any problems, questions or concerns. We can certainly see the patient much sooner if necessary.  The patient and plan discussed with Glenford Peers, MD and he is in agreement with the aforementioned.  I spent 40 minutes counseling the patient face to face. The total time spent in the appointment was 55 minutes.  More than 50% of the time spent with the patient was utilized for counseling.   KEFALAS,THOMAS

## 2010-11-29 NOTE — Patient Instructions (Signed)
[facility]  Discharge Instructions   SPECIAL INSTRUCTIONS/FOLLOW-UP: Lab work Needed as scheduled and Return to Clinic on in two months.   I acknowledge that I have been informed and understand all the instructions given to me and received a copy. I do not have any more questions at this time, but understand that I may call the Specialty Clinic at [facility] at [phone] during business hours should I have any further questions or need assistance in obtaining follow-up care.    __________________________________________  _____________  __________ Signature of Patient or Authorized Representative            Date                   Time    __________________________________________ Nurse's Signature

## 2010-12-01 ENCOUNTER — Encounter (HOSPITAL_BASED_OUTPATIENT_CLINIC_OR_DEPARTMENT_OTHER): Payer: Medicare Other

## 2010-12-01 ENCOUNTER — Other Ambulatory Visit (HOSPITAL_COMMUNITY): Payer: Self-pay | Admitting: Oncology

## 2010-12-01 ENCOUNTER — Telehealth (HOSPITAL_COMMUNITY): Payer: Self-pay | Admitting: *Deleted

## 2010-12-01 DIAGNOSIS — I82409 Acute embolism and thrombosis of unspecified deep veins of unspecified lower extremity: Secondary | ICD-10-CM

## 2010-12-01 LAB — PROTIME-INR
INR: 1.95 — ABNORMAL HIGH (ref 0.00–1.49)
Prothrombin Time: 22.6 seconds — ABNORMAL HIGH (ref 11.6–15.2)

## 2010-12-01 MED ORDER — WARFARIN SODIUM 1 MG PO TABS: 1.0000 mg | ORAL_TABLET | ORAL | Status: DC

## 2010-12-01 MED ORDER — WARFARIN SODIUM 10 MG PO TABS: 10.0000 mg | ORAL_TABLET | ORAL | Status: DC

## 2010-12-01 MED ORDER — WARFARIN SODIUM 5 MG PO TABS: 5.0000 mg | ORAL_TABLET | Freq: Every day | ORAL | Status: DC

## 2010-12-01 NOTE — Telephone Encounter (Signed)
message left on answering machine to continue same dose Coumadin 29mg  daily. PT/INR on tue 9/5 at 925am

## 2010-12-01 NOTE — Telephone Encounter (Signed)
Message copied by Dennie Maizes on Fri Dec 01, 2010  4:23 PM ------      Message from: Mariel Sleet, ERIC S      Created: Fri Dec 01, 2010 11:32 AM       Same dose -INR Tuesday!

## 2010-12-01 NOTE — Progress Notes (Signed)
Labs drawn today for pt.  Pt on 29mg  coud and 1 lovenox shot a day Call patient at [phone]

## 2010-12-05 ENCOUNTER — Telehealth (HOSPITAL_COMMUNITY): Payer: Self-pay

## 2010-12-05 ENCOUNTER — Encounter (HOSPITAL_COMMUNITY): Payer: Medicare Other | Attending: Oncology

## 2010-12-05 DIAGNOSIS — I82409 Acute embolism and thrombosis of unspecified deep veins of unspecified lower extremity: Secondary | ICD-10-CM | POA: Insufficient documentation

## 2010-12-05 NOTE — Progress Notes (Signed)
Addended by: Sterling Big on: 12/05/2010 05:46 PM   Modules accepted: Orders

## 2010-12-05 NOTE — Telephone Encounter (Signed)
To stop lovenox, change coumadin to 28mg  daily and RTC 9/6 for PT/INR.  Discussed with patient.

## 2010-12-05 NOTE — Progress Notes (Signed)
Labs drawn today for pt.  Patient on 29mg  and 1 lovenox shot a day.  Call patient at [phone]

## 2010-12-07 ENCOUNTER — Telehealth (HOSPITAL_COMMUNITY): Payer: Self-pay

## 2010-12-07 ENCOUNTER — Other Ambulatory Visit (HOSPITAL_COMMUNITY): Payer: Self-pay | Admitting: Oncology

## 2010-12-07 ENCOUNTER — Encounter (HOSPITAL_BASED_OUTPATIENT_CLINIC_OR_DEPARTMENT_OTHER): Payer: Medicare Other

## 2010-12-07 DIAGNOSIS — I82409 Acute embolism and thrombosis of unspecified deep veins of unspecified lower extremity: Secondary | ICD-10-CM

## 2010-12-07 LAB — PROTIME-INR: Prothrombin Time: 19.3 seconds — ABNORMAL HIGH (ref 11.6–15.2)

## 2010-12-07 NOTE — Progress Notes (Signed)
Labs drawn today for pt.  Patient on 28mg  coud. Call pt at [phone]

## 2010-12-07 NOTE — Telephone Encounter (Signed)
Instructed to change coumadin to 29 mg alternating with 28 mg. To RTC 9/11 for PT level.

## 2010-12-12 ENCOUNTER — Other Ambulatory Visit (HOSPITAL_COMMUNITY): Payer: Self-pay | Admitting: Oncology

## 2010-12-12 ENCOUNTER — Encounter (HOSPITAL_BASED_OUTPATIENT_CLINIC_OR_DEPARTMENT_OTHER): Payer: Medicare Other

## 2010-12-12 ENCOUNTER — Telehealth (HOSPITAL_COMMUNITY): Payer: Self-pay

## 2010-12-12 DIAGNOSIS — I82409 Acute embolism and thrombosis of unspecified deep veins of unspecified lower extremity: Secondary | ICD-10-CM

## 2010-12-12 NOTE — Progress Notes (Signed)
Labs drawn today for pt. Patient on 28 and 29 mg alternating.  Call patient at [phone]

## 2010-12-12 NOTE — Telephone Encounter (Signed)
Patient notified to continue coumadin same dosage 29 mg alternating with 28 mg and to return 9/14 for recheck.

## 2010-12-15 ENCOUNTER — Encounter (HOSPITAL_BASED_OUTPATIENT_CLINIC_OR_DEPARTMENT_OTHER): Payer: Medicare Other

## 2010-12-15 ENCOUNTER — Telehealth (HOSPITAL_COMMUNITY): Payer: Self-pay

## 2010-12-15 ENCOUNTER — Other Ambulatory Visit (HOSPITAL_COMMUNITY): Payer: Self-pay | Admitting: Oncology

## 2010-12-15 DIAGNOSIS — I82409 Acute embolism and thrombosis of unspecified deep veins of unspecified lower extremity: Secondary | ICD-10-CM

## 2010-12-15 LAB — PROTIME-INR
INR: 2.89 — ABNORMAL HIGH (ref 0.00–1.49)
Prothrombin Time: 30.7 seconds — ABNORMAL HIGH (ref 11.6–15.2)

## 2010-12-15 NOTE — Progress Notes (Signed)
Labs drawn today for pt. Patient on 28 and 29 mg alternating.  Call patient at [phone] 

## 2010-12-15 NOTE — Telephone Encounter (Signed)
Notes Recorded by Dellis Anes, PA on 12/15/2010 at 12:24 PM Same dose.  PT/INR on Tuesday  1334 - patient notified. SLorelle Gibbs

## 2010-12-19 ENCOUNTER — Encounter (HOSPITAL_COMMUNITY): Payer: Medicare Other

## 2010-12-19 ENCOUNTER — Other Ambulatory Visit (HOSPITAL_COMMUNITY): Payer: Self-pay | Admitting: Oncology

## 2010-12-19 ENCOUNTER — Telehealth (HOSPITAL_COMMUNITY): Payer: Self-pay

## 2010-12-19 DIAGNOSIS — I82409 Acute embolism and thrombosis of unspecified deep veins of unspecified lower extremity: Secondary | ICD-10-CM

## 2010-12-19 LAB — PROTIME-INR: INR: 2.18 — ABNORMAL HIGH (ref 0.00–1.49)

## 2010-12-19 NOTE — Progress Notes (Signed)
Labs drawn today for pt. Patient on 28 and 29 mg alternating.  Call patient at [phone] 

## 2010-12-19 NOTE — Telephone Encounter (Signed)
Patient called to inform of PT/INR results and to continue same dose of Coumadin.  Patient verbalized understanding and was given appointment for follow up lab.

## 2010-12-27 ENCOUNTER — Encounter (HOSPITAL_BASED_OUTPATIENT_CLINIC_OR_DEPARTMENT_OTHER): Payer: Medicare Other

## 2010-12-27 ENCOUNTER — Telehealth (HOSPITAL_COMMUNITY): Payer: Self-pay | Admitting: *Deleted

## 2010-12-27 ENCOUNTER — Other Ambulatory Visit (HOSPITAL_COMMUNITY): Payer: Self-pay | Admitting: Oncology

## 2010-12-27 DIAGNOSIS — I82409 Acute embolism and thrombosis of unspecified deep veins of unspecified lower extremity: Secondary | ICD-10-CM

## 2010-12-27 LAB — PROTIME-INR: Prothrombin Time: 23.5 seconds — ABNORMAL HIGH (ref 11.6–15.2)

## 2010-12-27 NOTE — Progress Notes (Signed)
Labs drawn today for pt.  Patient on 28mg  and 29 mg of coud.  Call patient at [phone]

## 2010-12-27 NOTE — Telephone Encounter (Signed)
Spoke with pt, instructions as below. Pt will continue alt Coumadin 28mg  with 29mg  every other day. Verbalizes understanding.

## 2010-12-27 NOTE — Telephone Encounter (Signed)
Message copied by Dennie Maizes on Wed Dec 27, 2010  2:58 PM ------      Message from: Ellouise Newer III      Created: Wed Dec 27, 2010  2:45 PM       Same dose Coumadin            PT/INR in 10 days

## 2011-01-05 ENCOUNTER — Other Ambulatory Visit (HOSPITAL_COMMUNITY): Payer: Self-pay | Admitting: Oncology

## 2011-01-05 ENCOUNTER — Encounter (HOSPITAL_COMMUNITY): Payer: Medicare Other | Attending: Oncology

## 2011-01-05 DIAGNOSIS — I82409 Acute embolism and thrombosis of unspecified deep veins of unspecified lower extremity: Secondary | ICD-10-CM

## 2011-01-05 DIAGNOSIS — M542 Cervicalgia: Secondary | ICD-10-CM | POA: Insufficient documentation

## 2011-01-05 DIAGNOSIS — R51 Headache: Secondary | ICD-10-CM | POA: Insufficient documentation

## 2011-01-05 NOTE — Progress Notes (Signed)
Addended by: Sterling Big on: 01/05/2011 05:49 PM   Modules accepted: Orders

## 2011-01-05 NOTE — Progress Notes (Signed)
Labs drawn today for pt. Patient on 28mg and 29mg alternating.  Call patient at [phone] 

## 2011-01-05 NOTE — Progress Notes (Signed)
Per Dr. Winfred Burn order, patient notified to continue coumadin 28 mg alternating with 29 mg and instructed to RTC in 2 weeks for next PT/INR.  Per patient : will be on vacation that week and will come the following Tuesday on  10/23 for next level.

## 2011-01-09 ENCOUNTER — Encounter (HOSPITAL_BASED_OUTPATIENT_CLINIC_OR_DEPARTMENT_OTHER): Payer: Medicare Other | Admitting: Oncology

## 2011-01-09 ENCOUNTER — Ambulatory Visit (HOSPITAL_COMMUNITY): Payer: Medicare Other | Admitting: Oncology

## 2011-01-09 VITALS — BP 121/73 | HR 96 | Temp 98.3°F | Wt 210.0 lb

## 2011-01-09 DIAGNOSIS — I82409 Acute embolism and thrombosis of unspecified deep veins of unspecified lower extremity: Secondary | ICD-10-CM

## 2011-01-09 NOTE — Progress Notes (Signed)
This office note has been dictated.

## 2011-01-09 NOTE — Patient Instructions (Signed)
[facility]  Discharge Instructions  RECOMMENDATIONS MADE BY THE CONSULTANT AND ANY TEST RESULTS WILL BE SENT TO YOUR REFERRING DOCTOR.   EXAM FINDINGS BY MD TODAY AND SIGNS AND SYMPTOMS TO REPORT TO CLINIC OR PRIMARY MD: recommend that you take anticoagulant therapy for full 6 months.  Would need to continue until mid January 2013.  You have homocystienemia with increases your risk of clots and heart attack.  Try to reduce other risks that you have by stopping smoking and some weight loss.  MEDICATIONS PRESCRIBED: none     SPECIAL INSTRUCTIONS/FOLLOW-UP: Lab work Needed on 10/18 and you will see Jenita Seashore in 4 weeks and Dr. Mariel Sleet in 8 weeks.   I acknowledge that I have been informed and understand all the instructions given to me and received a copy. I do not have any more questions at this time, but understand that I may call the Specialty Clinic at [facility] at [phone] during business hours should I have any further questions or need assistance in obtaining follow-up care.    __________________________________________  _____________  __________ Signature of Patient or Authorized Representative            Date                   Time    __________________________________________ Nurse's Signature

## 2011-01-10 NOTE — Progress Notes (Signed)
CC:   [name], M.D. [name], M.D.  DIAGNOSES: 1. Deep venous thrombosis of the right leg occurring after trauma in     early June 2012.  Diagnosed, however, October 05, 2010, treated with     Lovenox and Coumadin at that time.  However, there was a gap of     Lovenox for 2 weeks where she was totally nontherapeutic. 2. Homocysteinemia. 3. History of phlebitis of the foot and lower leg in her 20s while on     birth control pills.  She was also on hormone replacement therapy     at the time of this deep venous thrombosis in June 2012.  That has     been stopped however at this time. 4. Chronic obstructive pulmonary disease secondary to longstanding     smoking history, and she clearly does need to quit. 5. History of hypercholesterolemia on simvastatin 20 mg a day. 6. History of hypertension on therapy.  NARRATIVE:  This is a very nice lady who is accompanied once again by her husband.  She is now therapeutic on Coumadin 28 mg alternating with 29 mg.  She is probably one of these people who is a rapid acetylator of Coumadin.  She is, however, is very therapeutic presently with her most recent INR being 2.16 on 01/05/2011 and the previous 4 before that are also perfectly therapeutic.  Lovenox has been discontinued.  She was told the other day by Dr. Allyson Sabal that he would like her to quit the Coumadin in November and then repeat her Dopplers a month or so later. He had actually done a Doppler study recently with resolution of the DVT which is exactly what we would expect with the body resolving the clot and with the Coumadin preventing new clot formation.  I think from a hypercoagulable standpoint she has homocysteinemia which has certainly been associated with an increased risk of DVT, but she was also on hormone replacement therapy at the time as well and that needs to be remembered.  I think from the standpoint of therapy I personally would favor 6 months of full  anticoagulation counting the Lovenox and the Coumadin, that is when her INR is therapeutic. This then would end officially as of mid January 2013.  After that I do not have any qualms about repeating her Dopplers, but I think the resolution already is indicative of the body healing the clot and resorbing at as well as the removal of the offending factor which was possibly trauma, hormone replacement therapy,etc.  She, however, probably will never get rid of the homocysteinemia, but it may also increase the risk of vascular disease elsewhere potentially.  The above is why she needs to quit smoking even more so now than ever.  ASSESSMENT AND PLAN:  So therefore, I will send this note to Dr. Allyson Sabal with my thoughts and if he has any questions, I'm sure he will call me.  We will continue to see [name] back for her routine INRs and she will come back on the October 18th.  If all is well then I think we can go 3-4 weeks for the next INR check.  We spent about 35-40 minutes going over her history of the homocysteinemia,its implications, the hormone replacement therapy.The history of phlebitis as a young woman with birth control pill usage at that time, and the fact that she is not truly Coumadin-resistant as much as she is probably just one of the small percentage of people that  is a rapid acetylator of Coumadin and needs a higher than typical dose.  I think she and her husband understand this now.  She is active.  I want her to stay active.  [facility] but I think if she goes on long trips I want her to get out and walk around the car every 5 minutes.  If she goes on a plane she needs to walk up and down the aisle every hour for 5 minutes or so.  The same with a bus ride, etc.  We will see her back in about 1 month either way.    ______________________________ Ladona Horns. Mariel Sleet, MD ESN/MEDQ  D:  01/09/2011  T:  01/10/2011  Job:  [id number]

## 2011-01-17 ENCOUNTER — Emergency Department (HOSPITAL_COMMUNITY)
Admission: EM | Admit: 2011-01-17 | Discharge: 2011-01-17 | Disposition: A | Discharge status: Home / Self Care | Payer: Medicare Other | Attending: Emergency Medicine | Admitting: Emergency Medicine

## 2011-01-17 ENCOUNTER — Telehealth (HOSPITAL_COMMUNITY): Payer: Self-pay

## 2011-01-17 ENCOUNTER — Encounter (HOSPITAL_COMMUNITY): Payer: Self-pay | Admitting: *Deleted

## 2011-01-17 ENCOUNTER — Encounter (HOSPITAL_COMMUNITY): Payer: Medicare Other

## 2011-01-17 ENCOUNTER — Encounter (HOSPITAL_BASED_OUTPATIENT_CLINIC_OR_DEPARTMENT_OTHER): Payer: Medicare Other | Admitting: Oncology

## 2011-01-17 ENCOUNTER — Other Ambulatory Visit (HOSPITAL_COMMUNITY): Payer: Self-pay | Admitting: Oncology

## 2011-01-17 ENCOUNTER — Ambulatory Visit (HOSPITAL_COMMUNITY)
Admission: RE | Admit: 2011-01-17 | Discharge: 2011-01-17 | Disposition: A | Discharge status: Home / Self Care | Payer: Medicare Other | Source: Ambulatory Visit | Attending: Oncology | Admitting: Oncology

## 2011-01-17 ENCOUNTER — Emergency Department (HOSPITAL_COMMUNITY): Payer: Medicare Other

## 2011-01-17 DIAGNOSIS — N269 Renal sclerosis, unspecified: Secondary | ICD-10-CM | POA: Insufficient documentation

## 2011-01-17 DIAGNOSIS — I10 Essential (primary) hypertension: Secondary | ICD-10-CM | POA: Insufficient documentation

## 2011-01-17 DIAGNOSIS — M542 Cervicalgia: Secondary | ICD-10-CM | POA: Insufficient documentation

## 2011-01-17 DIAGNOSIS — I82409 Acute embolism and thrombosis of unspecified deep veins of unspecified lower extremity: Secondary | ICD-10-CM

## 2011-01-17 DIAGNOSIS — J323 Chronic sphenoidal sinusitis: Secondary | ICD-10-CM | POA: Insufficient documentation

## 2011-01-17 DIAGNOSIS — R51 Headache: Secondary | ICD-10-CM

## 2011-01-17 DIAGNOSIS — Z7901 Long term (current) use of anticoagulants: Secondary | ICD-10-CM | POA: Insufficient documentation

## 2011-01-17 DIAGNOSIS — Z86718 Personal history of other venous thrombosis and embolism: Secondary | ICD-10-CM | POA: Insufficient documentation

## 2011-01-17 DIAGNOSIS — E119 Type 2 diabetes mellitus without complications: Secondary | ICD-10-CM | POA: Insufficient documentation

## 2011-01-17 DIAGNOSIS — F17200 Nicotine dependence, unspecified, uncomplicated: Secondary | ICD-10-CM | POA: Insufficient documentation

## 2011-01-17 DIAGNOSIS — Z79899 Other long term (current) drug therapy: Secondary | ICD-10-CM | POA: Insufficient documentation

## 2011-01-17 DIAGNOSIS — M129 Arthropathy, unspecified: Secondary | ICD-10-CM | POA: Insufficient documentation

## 2011-01-17 LAB — CBC
HCT: 37 % (ref 36.0–46.0)
Hemoglobin: 12 g/dL (ref 12.0–15.0)
MCH: 28.4 pg (ref 26.0–34.0)
MCHC: 32.4 g/dL (ref 30.0–36.0)

## 2011-01-17 LAB — BASIC METABOLIC PANEL
BUN: 15 mg/dL (ref 6–23)
Chloride: 103 mEq/L (ref 96–112)
Glucose, Bld: 214 mg/dL — ABNORMAL HIGH (ref 70–99)
Potassium: 3.7 mEq/L (ref 3.5–5.1)

## 2011-01-17 LAB — DIFFERENTIAL
Basophils Relative: 1 % (ref 0–1)
Eosinophils Absolute: 0.1 10*3/uL (ref 0.0–0.7)
Monocytes Absolute: 0.6 10*3/uL (ref 0.1–1.0)
Monocytes Relative: 12 % (ref 3–12)

## 2011-01-17 MED ORDER — IBUPROFEN 800 MG PO TABS
800.0000 mg | ORAL_TABLET | Freq: Once | ORAL | Status: AC
  Administered 2011-01-17: 800 mg via ORAL
  Filled 2011-01-17: qty 1

## 2011-01-17 MED ORDER — DIAZEPAM 2 MG PO TABS: 2.0000 mg | ORAL_TABLET | Freq: Every day | ORAL | Status: AC

## 2011-01-17 MED ORDER — ACETAMINOPHEN 500 MG PO TABS
1000.0000 mg | ORAL_TABLET | Freq: Once | ORAL | Status: AC
  Administered 2011-01-17: 1000 mg via ORAL
  Filled 2011-01-17: qty 2

## 2011-01-17 MED ORDER — DIAZEPAM 5 MG PO TABS
5.0000 mg | ORAL_TABLET | Freq: Once | ORAL | Status: AC
  Administered 2011-01-17: 5 mg via ORAL
  Filled 2011-01-17: qty 1

## 2011-01-17 MED ORDER — HYDROCODONE-ACETAMINOPHEN 10-325 MG PO TABS: 1.0000 | ORAL_TABLET | Freq: Once | ORAL | Status: DC

## 2011-01-17 MED ORDER — IBUPROFEN 600 MG PO TABS: 600.0000 mg | ORAL_TABLET | Freq: Three times a day (TID) | ORAL | Status: AC

## 2011-01-17 NOTE — ED Provider Notes (Signed)
History     CSN: [id number] Arrival date & time: 01/17/2011 12:42 PM   First MD Initiated Contact with Patient 01/17/11 1323      Chief Complaint  Patient presents with  . Headache    (Consider location/radiation/quality/duration/timing/severity/associated sxs/prior treatment) HPI The patient presents with headache. Shows the gradual onset of headache about 4 days ago. She clarifies that noting that she developed a headache along with bilateral paraspinal neck tightness. Since onset her symptoms have been worsening, with some waxing and waning component. She has achieved some relief with OTC analgesia. She denies any confusion, disorientation, visual changes, nausea, vomiting, chest pain, dyspnea, abdominal pain, ataxia, discoordination or any other focal complaints. She does have a history of headaches, though she has not had one in some time. She also has a notable recent history of DVT for which he is anticoagulated. Today she presented for outpatient evaluation, had a CAT scan which did not demonstrate acute findings, M.D. to the persistency of her headache she was referred daily for further evaluation. Past Medical History  Diagnosis Date  . Hypertension   . Diabetes mellitus     non insulin  . Arthritis   . Kidney atrophy     rt.  . History of blood clots   . Homocysteinemia 11/29/2010    Past Surgical History  Procedure Date  . Abdominal hysterectomy   . Knee surgery     rt. arthroscopic  . Tonsillectomy   . Hypertension   . Type ll diabetes   . Neck surgery     No family history on file.  History  Substance Use Topics  . Smoking status: Current Everyday Smoker  . Smokeless tobacco: Not on file  . Alcohol Use: No    OB History    Grav Para Term Preterm Abortions TAB SAB Ect Mult Living                  Review of Systems  All other systems reviewed and are negative.    Allergies  Ace inhibitors; Celecoxib; and Hydrocodone-acetaminophen  Home  Medications   Current Outpatient Rx  Name Route Sig Dispense Refill  . ACETAMINOPHEN 500 MG PO TABS Oral Take 500 mg by mouth every 6 (six) hours as needed.      . B COMPLEX PO TABS Oral Take 1 tablet by mouth 2 (two) times daily.      Marland Kitchen VITAMIN D3 1000 UNITS PO CAPS Oral Take 1 tablet by mouth daily.      . DEXLANSOPRAZOLE 60 MG PO CPDR Oral Take 60 mg by mouth daily.      Marland Kitchen DIPHENOXYLATE-ATROPINE 2.5-0.025 MG PO TABS Oral Take 1 tablet by mouth 4 (four) times daily as needed.      Marland Kitchen FOLIC ACID 1 MG PO TABS Oral Take 1 tablet (1 mg total) by mouth 2 (two) times daily. 60 tablet 3  . GLIMEPIRIDE 1 MG PO TABS Oral Take 1 mg by mouth daily before breakfast.      . LINAGLIPTIN 5 MG PO TABS Oral Take 5 mg by mouth daily.      Marland Kitchen NIFEDIPINE ER OSMOTIC 60 MG PO TB24 Oral Take 60 mg by mouth daily.      Marland Kitchen VITAMIN C 500 MG PO TABS Oral Take 500 mg by mouth daily.      . WARFARIN SODIUM 1 MG PO TABS Oral Take 1 tablet (1 mg total) by mouth as directed. 120 tablet 3  . WARFARIN SODIUM 10  MG PO TABS Oral Take 1 tablet (10 mg total) by mouth as directed. 75 tablet 3  . WARFARIN SODIUM 5 MG PO TABS Oral Take 29 mg by mouth daily. Alternate 29mg  and 28mg  every other day    . WARFARIN SODIUM 5 MG PO TABS Oral Take 1 tablet (5 mg total) by mouth daily. 75 tablet 3    BP 162/79  Pulse 83  Temp(Src) 97.9 F (36.6 C) (Oral)  Resp 16  Ht 5\' 11"  (1.803 m)  Wt 210 lb (95.255 kg)  BMI 29.29 kg/m2  SpO2 99%  Physical Exam  Constitutional: She is oriented to person, place, and time. She appears well-developed and well-nourished.  HENT:  Head: Normocephalic and atraumatic.  Eyes: EOM are normal.  Neck: Neck supple. Normal carotid pulses and no JVD present. Muscular tenderness present. No tracheal tenderness and no spinous process tenderness present. Carotid bruit is not present. No rigidity. Decreased range of motion present. No edema and no erythema present. No Brudzinski's sign noted. No mass and no  thyromegaly present.       Patient has slight limitation to lateral range of motion symmetrically, noting that her neck feels tight as she turns beyond approximately 25.  Cardiovascular: Normal rate and regular rhythm.   Pulmonary/Chest: Effort normal and breath sounds normal.  Abdominal: She exhibits no distension.  Musculoskeletal: She exhibits no edema and no tenderness.  Neurological: She is alert and oriented to person, place, and time. She has normal strength. She is not disoriented. No cranial nerve deficit or sensory deficit. Coordination and gait normal.  Skin: Skin is warm and dry.    ED Course  Procedures (including critical care time)  Labs Reviewed - No data to display Ct Head Wo Contrast  01/17/2011  *RADIOLOGY REPORT*  Clinical Data: Worst headache however, neck pain, on Coumadin  CT HEAD WITHOUT CONTRAST  Technique:  Contiguous axial images were obtained from the base of the skull through the vertex without contrast.  Comparison: CT brain scan of 07/16/2009  Findings: The ventricular system is stable in size and configuration with slight cerebellar folia prominence of appearing stable.  The septum is midline in position.  The fourth ventricle basilar cisterns are stable.  No hemorrhage, mass lesion, or acute infarction is seen. Mild small vessel ischemic change is noted throughout the periventricular white matter particularly in the posterior parietal white matter.  On bone window images, no acute calvarial abnormality is seen.  There is some fluid in the sphenoid sinus consistent with sphenoid sinus disease.  IMPRESSION:  1.  No acute intracranial abnormality. 2.  No change in small vessel ischemic change in the periventricular white matter. 3.  Sphenoid sinus disease.  Original Report Authenticated By: Juline Patch, M.D.     No diagnosis found.    MDM  65 year old female presents with headache. The care for 6 of headache; gradual onset, mild, intermittent, with no  neurologic changes are reassuring. The patient's description of neck soreness, is further reassuring and suggestive of a tension type headache. Patient's physical exam is notable for no neurologic findings, and absence of distress with stable vital signs. Following by mouth medications the patient noted resolution of her symptoms. Return precautions were discussed with her. She'll be provided a very short course of anti-inflammatories and benzodiazepines, risks and benefits of these medicines were discussed with the patient and her husband. She'll follow up with her primary care doctor    Gerhard Munch, MD 01/17/11 [phone]

## 2011-01-17 NOTE — Progress Notes (Signed)
Labs drawn today for cbc/diff,bmp, pt.  Patient on 28mg  and 29mg  alternating.  Call patient at [phone]

## 2011-01-17 NOTE — ED Notes (Signed)
MD at bedside. Dr Clide Dales with pt and family discussing plan of care and examined pt.

## 2011-01-17 NOTE — Telephone Encounter (Signed)
Notes Recorded by Dellis Anes, PA on 01/17/2011 at 4:32 PM Same dose Coumadin  PT/INR 2 weeks   1700 Patient still in ED, spoke with ED nurse and she will give message to Tamyah to continue current dosage of coumadin 28 mg alternating with 29 mg and to return for next PT/INR on 10/31 @ 8:50am./S. Mercy Riding, RN

## 2011-01-17 NOTE — ED Notes (Signed)
Pt states has had headache with worsening pain since this weekend. Pain also radiates to neck.  Pt also states headache is getting better, however her neck is still painful. Reports feels like she has " a crook "in her neck.  Pt has difficulty moving neck and head from side to side.

## 2011-01-17 NOTE — Progress Notes (Signed)
Subjective: Patient seen for a walk-in today. She was originally here at the cancer clinic for a PT/INR check due to the fact that she is on anticoagulation with Coumadin for DVT. While she was here she told the nurse that she has a headache has been ongoing since this weekend and is the worse headache she's ever had in over 40+ years. As result I was asked to see the patient today.  Patient was seen in the examination room today. She reports that she's had a headache since this weekend that has been gradually getting worse. She reports that it is the worst headache she's ever had. She also complaint of nuchal rigidity and this is quite evident upon my entrance into the examination room. She was unable to turn her head laterally to meet me with her eyes as I entered the room. The patient reports that she has this generalized headache that encompasses her whole cranium and rigidity of her neck. She reports that movement of her head not only increases her neck pain but also increases her headache discomfort. She reports that nothing helps with her pain. On further questioning she reports that she did take a  pain medication which relieved the pain slightly. She reports that this pain medication "to the end of my discomfort". She denies any fevers chills or night sweats at home. She denies any confusion or mental status changes.  Objective: Filed Vitals:   01/17/11 0945  BP: 163/92  Pulse: 79  Temp: 98.3 F (36.8 C)    Gen.: Patient is seen sitting in a chair in examination room. Upon my entrance of the room the patient is unable to manipulate her head laterally to meet me with her eyes. She clearly is in discomfort. She is not in acute distress. She is alert and oriented x3. HEENT: Atraumatic anicteric sclera normocephalic. Inability for her to flex her neck and extend her neck. She is unable to touch her chin to her chest. Active range of motion and passive range of motion is significantly decreased  before creating increase in pain. Cardiac: Regular rate and rhythm without murmur rub or gallop. No S3 or S4 appreciated. Lungs: Clear to auscultation bilaterally without wheezes rales or rhonchi. No sister must choose appreciated. Abdomen: positive bowel sounds all 4 quadrants. Soft. Nontender. Neuro: Patient is alert oriented x3. No focal deficits. Negative Kernig's sign and negative Brudzinski sign.  Assessment: 1. Headache 2. Nuchal rigidity without any fevers or mental status change 3. Hypertension 4. DVT, on Coumadin anticoagulation. Therapeutic today.  Plan: 1. CT scan of head without contrast STAT to evaluate for bleeding. The patient is on Coumadin anticoagulation, however her INR is therapeutic today at approximately 2.3. This scan was negative for bleeding.  2. I personally reviewed and went over radiographic studies with the patient. CT scan of the head was negative for intracranial bleeding. 3. I personally reviewed and went over laboratory results with the patient. 4. We will transfer the patient to the emergency room for further evaluation. An MRI of the brain may be necessary for further evaluation of her symptoms.  She is on Coumadin and therefore a lumbar puncture to evaluate for meningitis may be difficult, but necessary. I will defer further workup of this patient and her symptoms to the emergency room physician.  5. I physically went to the emergency room and discussed this patient's case with the emergency room physician. He understands that the patient is on high dose Coumadin anticoagulation therapy for her DVT.  She is on 28 mg alternating with 29 mg of Coumadin.  This patient's case was his best with Dr. Thalia Party at [facility] and she agrees with the aforementioned up plan.  More than 50% of the time spent with the patient was utilized for counseling and coordination of care.   Ladarion Munyon

## 2011-01-17 NOTE — Progress Notes (Signed)
Has not take BP medicine today, was running late and has to eat when takes medication.  Discussed with PA  & patient instructed to take am medications and water and peanut butter with graham crackers given to eat.

## 2011-01-17 NOTE — ED Notes (Signed)
Pt states head and neck pain since Saturday or Sunday. Denies fever or any other symptoms. Pt was in specialty clinic for blood test and and CT obtained due to headache (which was negative, per pt). Pt sent by PA.

## 2011-01-17 NOTE — ED Notes (Signed)
Pt transported to radiology.

## 2011-01-18 ENCOUNTER — Other Ambulatory Visit (HOSPITAL_COMMUNITY): Payer: Medicare Other

## 2011-01-31 ENCOUNTER — Encounter (HOSPITAL_COMMUNITY): Payer: Medicare Other

## 2011-01-31 ENCOUNTER — Other Ambulatory Visit (HOSPITAL_COMMUNITY): Payer: Self-pay | Admitting: Oncology

## 2011-01-31 ENCOUNTER — Telehealth (HOSPITAL_COMMUNITY): Payer: Self-pay | Admitting: *Deleted

## 2011-01-31 DIAGNOSIS — I82409 Acute embolism and thrombosis of unspecified deep veins of unspecified lower extremity: Secondary | ICD-10-CM

## 2011-01-31 LAB — PROTIME-INR
INR: 2.23 — ABNORMAL HIGH (ref 0.00–1.49)
Prothrombin Time: 25.1 seconds — ABNORMAL HIGH (ref 11.6–15.2)

## 2011-01-31 NOTE — Telephone Encounter (Signed)
Pt notified to continue same dose of coumadin. PT/INR in 3 weeks.

## 2011-01-31 NOTE — Progress Notes (Signed)
Labs drawn today for pt. Patient on 28mg  and 29mg  alternating.  Call patient at [phone]

## 2011-02-06 ENCOUNTER — Other Ambulatory Visit (HOSPITAL_COMMUNITY): Payer: Self-pay | Admitting: Oncology

## 2011-02-06 ENCOUNTER — Encounter (HOSPITAL_COMMUNITY): Payer: Medicare Other | Attending: Oncology | Admitting: Oncology

## 2011-02-06 VITALS — BP 152/82 | HR 83 | Wt 213.6 lb

## 2011-02-06 DIAGNOSIS — I82409 Acute embolism and thrombosis of unspecified deep veins of unspecified lower extremity: Secondary | ICD-10-CM

## 2011-02-06 DIAGNOSIS — R7983 Abnormal findings of blood amino-acid level: Secondary | ICD-10-CM

## 2011-02-06 DIAGNOSIS — E7210 Disorders of sulfur-bearing amino-acid metabolism, unspecified: Secondary | ICD-10-CM | POA: Insufficient documentation

## 2011-02-06 DIAGNOSIS — R82998 Other abnormal findings in urine: Secondary | ICD-10-CM

## 2011-02-06 LAB — URINALYSIS, ROUTINE W REFLEX MICROSCOPIC
Bilirubin Urine: NEGATIVE
Ketones, ur: NEGATIVE mg/dL
Nitrite: NEGATIVE
Specific Gravity, Urine: 1.02 (ref 1.005–1.030)
Urobilinogen, UA: 0.2 mg/dL (ref 0.0–1.0)

## 2011-02-06 NOTE — Progress Notes (Signed)
Addended by: Sterling Big on: 02/06/2011 11:41 AM   Modules accepted: Orders

## 2011-02-06 NOTE — Progress Notes (Signed)
Colette Ribas, MD [address] [place] [address]  1. Dark urine  Urinalysis, dipstick only  2. Homocysteinemia    3. Deep vein thrombosis (DVT)      CURRENT THERAPY: On Coumadin anticoagulation  INTERVAL HISTORY: BEKAH IGOE 65 y.o. female returns for  regular  visit for followup of DVT.  The patient denies any complaints.  She is pleased that her previous headache which caused her to report to the ER has resolved.    On further questioning, the patient denies any bleeding, but does admit to dark urine.  She denies any urinary burning, frequency, pain, and urgency.  She denies any urinary odor.  The patient explains that she is ready for Thanksgiving.  She plans on going to her daughter's residence for Thanksgiving in order to relieve some stress in her life.  She does not want to experience the stress associated with Thanksgiving dinner for the family and therefore has opted to visit her daughter.  I went over patient education regarding her DVT.  She understands that she will undergo 6 months worth of anticoagulation.  Clinically her right leg is much improved, but after the 6 months of anticoagulation, we can certainly entertain performing a Doppler US to conform resolution of the DVT.  The patient asks if she can get back in the pool to perform aerobic exercises and walking.  I encouraged her to do that.  The patient asks if she is at increased risk of a future blood clot.  Since she has already had one, I told her that, yes, she is at a slightly increased risk of a future blood clot.  I went over the signs and symptoms of a blood clot for future reference.  Past Medical History  Diagnosis Date  . Hypertension   . Diabetes mellitus     non insulin  . Arthritis   . Kidney atrophy     rt.  . History of blood clots   . Homocysteinemia 11/29/2010    has ARTHRITIS, LEFT KNEE; HIGH BLOOD PRESSURE; Deep vein thrombosis (DVT); and Homocysteinemia  on her problem list.     is allergic to ace inhibitors; celecoxib; and hydrocodone-acetaminophen.  Ms. Darwish does not currently have medications on file.  Past Surgical History  Procedure Date  . Abdominal hysterectomy   . Knee surgery     rt. arthroscopic  . Tonsillectomy   . Hypertension   . Type ll diabetes   . Neck surgery     Denies any headaches, dizziness, double vision, fevers, chills, night sweats, nausea, vomiting, diarrhea, constipation, chest pain, heart palpitations, shortness of breath, blood in stool, black tarry stool, urinary pain, urinary burning, urinary frequency, hematuria.   PHYSICAL EXAMINATION  ECOG PERFORMANCE STATUS: 0 - Asymptomatic  Filed Vitals:   02/06/11 1009  BP: 152/82  Pulse: 83    GENERAL:alert, healthy, no distress, well nourished, well developed, comfortable, cooperative and smiling SKIN: skin color, texture, turgor are normal HEAD: Normocephalic EYES: normal EARS: External ears normal OROPHARYNX:mucous membranes are moist  NECK: supple, no adenopathy, thyroid normal size, non-tender, without nodularity, trachea midline LYMPH:  no palpable lymphadenopathy BREAST:not examined LUNGS: clear to auscultation and percussion HEART: regular rate & rhythm, no murmurs, no gallops, S1 normal and S2 normal ABDOMEN:abdomen soft, non-tender and normal bowel sounds BACK: Back symmetric, no curvature. EXTREMITIES:less then 2 second capillary refill, no joint deformities, effusion, or inflammation, no edema, no skin discoloration, no clubbing, no cyanosis  NEURO: alert & oriented x 3 with fluent speech, no focal motor/sensory deficits, gait normal   LABORATORY DATA: Lab Results  Component Value Date   INR 2.23* 01/31/2011   INR 2.34* 01/17/2011   INR 2.16* 01/05/2011      ASSESSMENT:  1. R LE DVT, on coumadin anticoagulation 2. Homocystinemia    PLAN:  1. UA dipstick to evaluate for hematuria. 2. PT/INR and homocysteine level on  02/21/11 3. I personally reviewed and went over laboratory results with the patient. 4. Return as scheduled to see Dr. Mariel Sleet on 03/06/11    All questions were answered. The patient knows to call the clinic with any problems, questions or concerns. We can certainly see the patient much sooner if necessary.  The patient and plan discussed with Glenford Peers, MD and he is in agreement with the aforementioned.  I spent 20 minutes counseling the patient face to face. The total time spent in the appointment was 25 minutes.  KEFALAS,THOMAS

## 2011-02-06 NOTE — Patient Instructions (Signed)
[facility]  Discharge Instructions  RECOMMENDATIONS MADE BY THE CONSULTANT AND ANY TEST RESULTS WILL BE SENT TO YOUR REFERRING DOCTOR.   EXAM FINDINGS BY MD TODAY AND SIGNS AND SYMPTOMS TO REPORT TO CLINIC OR PRIMARY MD: will check a urinalysis today.  If it shows anything we will let you know.  MEDICATIONS PRESCRIBED: none    SPECIAL INSTRUCTIONS/FOLLOW-UP: Return to Clinic as previously scheduled.   I acknowledge that I have been informed and understand all the instructions given to me and received a copy. I do not have any more questions at this time, but understand that I may call the Specialty Clinic at [facility] at [phone] during business hours should I have any further questions or need assistance in obtaining follow-up care.    __________________________________________  _____________  __________ Signature of Patient or Authorized Representative            Date                   Time    __________________________________________ Nurse's Signature

## 2011-02-21 ENCOUNTER — Encounter (HOSPITAL_BASED_OUTPATIENT_CLINIC_OR_DEPARTMENT_OTHER): Payer: Medicare Other

## 2011-02-21 DIAGNOSIS — E7210 Disorders of sulfur-bearing amino-acid metabolism, unspecified: Secondary | ICD-10-CM

## 2011-02-21 DIAGNOSIS — I82409 Acute embolism and thrombosis of unspecified deep veins of unspecified lower extremity: Secondary | ICD-10-CM

## 2011-02-21 NOTE — Progress Notes (Signed)
Addended by: Sterling Big on: 02/21/2011 06:02 PM   Modules accepted: Orders

## 2011-02-21 NOTE — Progress Notes (Signed)
Labs drawn today for pt and homocysteine,  Patient on 28 and 29 mg alternating.  Patient missed one day dose.  Patient also was on Levofloxacin for 7 days ,  Finished on 11-14

## 2011-02-26 ENCOUNTER — Other Ambulatory Visit (HOSPITAL_COMMUNITY): Payer: Medicare Other

## 2011-02-28 ENCOUNTER — Other Ambulatory Visit (HOSPITAL_COMMUNITY): Payer: Self-pay

## 2011-02-28 ENCOUNTER — Encounter (HOSPITAL_BASED_OUTPATIENT_CLINIC_OR_DEPARTMENT_OTHER): Payer: Medicare Other

## 2011-02-28 DIAGNOSIS — I82409 Acute embolism and thrombosis of unspecified deep veins of unspecified lower extremity: Secondary | ICD-10-CM

## 2011-02-28 LAB — PROTIME-INR
INR: 2.31 — ABNORMAL HIGH (ref 0.00–1.49)
Prothrombin Time: 25.8 seconds — ABNORMAL HIGH (ref 11.6–15.2)

## 2011-02-28 NOTE — Progress Notes (Signed)
Labs drawn today for pt.  Patient on 29 mg.  Call at [phone]

## 2011-03-06 ENCOUNTER — Encounter (HOSPITAL_COMMUNITY): Payer: Medicare Other | Attending: Oncology

## 2011-03-06 ENCOUNTER — Ambulatory Visit (HOSPITAL_COMMUNITY): Payer: Medicare Other | Admitting: Oncology

## 2011-03-06 DIAGNOSIS — I82409 Acute embolism and thrombosis of unspecified deep veins of unspecified lower extremity: Secondary | ICD-10-CM

## 2011-03-06 LAB — PROTIME-INR
INR: 2.42 — ABNORMAL HIGH (ref 0.00–1.49)
Prothrombin Time: 26.7 seconds — ABNORMAL HIGH (ref 11.6–15.2)

## 2011-03-06 NOTE — Progress Notes (Signed)
error 

## 2011-03-06 NOTE — Progress Notes (Signed)
Labs drawn  Today for pt.  Patient on 29mg  of coud.  Call patient at [phone]

## 2011-03-07 ENCOUNTER — Other Ambulatory Visit (HOSPITAL_COMMUNITY): Payer: Medicare Other

## 2011-03-16 ENCOUNTER — Encounter (HOSPITAL_COMMUNITY): Payer: Self-pay | Admitting: Oncology

## 2011-03-16 ENCOUNTER — Encounter (HOSPITAL_BASED_OUTPATIENT_CLINIC_OR_DEPARTMENT_OTHER): Payer: Medicare Other | Admitting: Oncology

## 2011-03-16 VITALS — BP 113/71 | HR 93 | Temp 98.1°F | Ht 69.29 in | Wt 213.6 lb

## 2011-03-16 DIAGNOSIS — I10 Essential (primary) hypertension: Secondary | ICD-10-CM

## 2011-03-16 DIAGNOSIS — I82409 Acute embolism and thrombosis of unspecified deep veins of unspecified lower extremity: Secondary | ICD-10-CM

## 2011-03-16 NOTE — Progress Notes (Signed)
This office note has been dictated.

## 2011-03-16 NOTE — Patient Instructions (Signed)
[facility]  Discharge Instructions [name]  [id number] [birth year]   RECOMMENDATIONS MADE BY THE CONSULTANT AND ANY TEST RESULTS WILL BE SENT TO YOUR REFERRING DOCTOR.   EXAM FINDINGS BY MD TODAY AND SIGNS AND SYMPTOMS TO REPORT TO CLINIC OR PRIMARY MD: Exam findings good.  Continue Vitamin B and Folic Acid.  Return to see Dr. Mariel Sleet in 5 weeks.   I acknowledge that I have been informed and understand all the instructions given to me and received a copy. I do not have any more questions at this time, but understand that I may call the Specialty Clinic at [facility] at [phone] during business hours should I have any further questions or need assistance in obtaining follow-up care.    __________________________________________  _____________  __________ Signature of Patient or Authorized Representative            Date                   Time    __________________________________________ Nurse's Signature

## 2011-03-16 NOTE — Progress Notes (Signed)
CC:   [name], M.D. [name], M.D.  DIAGNOSES: 1. Deep vein thrombosis of the right leg occurring after trauma in     early June 2012, diagnosed, however, October 05, 2010, treated with     Lovenox and Coumadin at that time.  There was a gap of     approximately 2 weeks from the Lovenox to the Coumadin where she     was totally nontherapeutic. 2. Homocystinemia, now normal with B vitamins and folic acid. 3. Phlebitis of the foot in her 20s while on birth control pills with     resolution after discontinuation of that, and she was also on     hormone replacement therapy at the time of this deep vein     thrombosis in June 2012, which is also been stopped. 4. Chronic obstructive pulmonary disease secondary to longstanding     smoking history, still smoking a pack a day. 5. 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 6. Hypercholesterolemia, on therapy. 7. History of hypertension, on therapy. On 29 mg or close to it, she is very therapeutic from an INR standpoint. She obviously is a rapid acetylator of Coumadin, but she is doing well with the dose of 29 mg presently and she is due for her next INR on the 19th.  We will see her in mid January and we will probably stop the Coumadin at the very end of January 2013.  I do not think we have justification for lifelong Coumadin at this juncture.  I think she does need lifelong B vitamin therapy for the homocystinemia.  She knows that only if she has a relapse or recurrence is she a candidate for lifelong Coumadin.  She absolutely needs to quit smoking.  We went over that again and I have encouraged her once again to work on that.  She did develop a glue allergy, she states, to NicoDerm patch.  So will see her in January. She will come back for her labs as scheduled.    ______________________________ Ladona Horns. Mariel Sleet, MD ESN/MEDQ  D:  03/16/2011  T:  03/16/2011  Job:  [id number]

## 2011-03-20 ENCOUNTER — Other Ambulatory Visit (HOSPITAL_COMMUNITY): Payer: Medicare Other

## 2011-03-21 ENCOUNTER — Encounter (HOSPITAL_BASED_OUTPATIENT_CLINIC_OR_DEPARTMENT_OTHER): Payer: Medicare Other

## 2011-03-21 ENCOUNTER — Other Ambulatory Visit (HOSPITAL_COMMUNITY): Payer: Self-pay | Admitting: Oncology

## 2011-03-21 DIAGNOSIS — I82409 Acute embolism and thrombosis of unspecified deep veins of unspecified lower extremity: Secondary | ICD-10-CM

## 2011-03-21 LAB — PROTIME-INR: Prothrombin Time: 32.1 seconds — ABNORMAL HIGH (ref 11.6–15.2)

## 2011-03-21 NOTE — Progress Notes (Signed)
Labs drawn today for pt.  Patient on 29mg  of coud.  Call patient at [phone]

## 2011-04-04 ENCOUNTER — Other Ambulatory Visit (HOSPITAL_COMMUNITY): Payer: Self-pay | Admitting: Oncology

## 2011-04-04 ENCOUNTER — Telehealth (HOSPITAL_COMMUNITY): Payer: Self-pay | Admitting: *Deleted

## 2011-04-04 ENCOUNTER — Encounter (HOSPITAL_COMMUNITY): Payer: Medicare Other | Attending: Oncology

## 2011-04-04 DIAGNOSIS — I82409 Acute embolism and thrombosis of unspecified deep veins of unspecified lower extremity: Secondary | ICD-10-CM | POA: Diagnosis not present

## 2011-04-04 NOTE — Progress Notes (Signed)
Patient had a pt drawn today.  Patient on 29mg  of coud.  Call patient at [phone]

## 2011-04-04 NOTE — Telephone Encounter (Signed)
Spoke with pt. Instructions as below. Verbalized understanding.

## 2011-04-04 NOTE — Telephone Encounter (Signed)
Message copied by Dennie Maizes on Wed Apr 04, 2011  4:38 PM ------      Message from: Ellouise Newer III      Created: Wed Apr 04, 2011 12:14 PM       Same dose            PT/INR in 3 weeks

## 2011-04-24 ENCOUNTER — Encounter (HOSPITAL_BASED_OUTPATIENT_CLINIC_OR_DEPARTMENT_OTHER): Payer: Medicare Other | Admitting: Oncology

## 2011-04-24 VITALS — BP 126/72 | HR 90 | Temp 97.5°F | Ht 69.29 in | Wt 215.0 lb

## 2011-04-24 DIAGNOSIS — I82409 Acute embolism and thrombosis of unspecified deep veins of unspecified lower extremity: Secondary | ICD-10-CM

## 2011-04-24 DIAGNOSIS — J449 Chronic obstructive pulmonary disease, unspecified: Secondary | ICD-10-CM | POA: Diagnosis not present

## 2011-04-24 DIAGNOSIS — F17200 Nicotine dependence, unspecified, uncomplicated: Secondary | ICD-10-CM | POA: Diagnosis not present

## 2011-04-24 DIAGNOSIS — E7210 Disorders of sulfur-bearing amino-acid metabolism, unspecified: Secondary | ICD-10-CM

## 2011-04-24 NOTE — Progress Notes (Signed)
This office note has been dictated.

## 2011-04-24 NOTE — Patient Instructions (Signed)
[name]  [id number] [birth year]   [facility]  Discharge Instructions  RECOMMENDATIONS MADE BY THE CONSULTANT AND ANY TEST RESULTS WILL BE SENT TO YOUR REFERRING DOCTOR.   EXAM FINDINGS BY MD TODAY AND SIGNS AND SYMPTOMS TO REPORT TO CLINIC OR PRIMARY MD: you are doing well.  Continue Coumadin through 1/31 then stop it. Can continue your caltrate, vitamin D, B complex and Folic Acid.  Restart baby aspirin after February 1st.  MEDICATIONS PRESCRIBED: Refill for folic acid   INSTRUCTIONS GIVEN AND DISCUSSED: Other :  Report extremity swelling, shortness of breath, etc.  SPECIAL INSTRUCTIONS/FOLLOW-UP: Lab work Needed tomorrow and Return to Clinic in August.   I acknowledge that I have been informed and understand all the instructions given to me and received a copy. I do not have any more questions at this time, but understand that I may call the Specialty Clinic at [facility] at [phone] during business hours should I have any further questions or need assistance in obtaining follow-up care.    __________________________________________  _____________  __________ Signature of Patient or Authorized Representative            Date                   Time    __________________________________________ Nurse's Signature

## 2011-04-24 NOTE — Progress Notes (Signed)
CC:   [name], M.D.  DIAGNOSIS: 1. Deep venous thrombosis of the right leg occurring after trauma in     early June 2012 but diagnosed on October 05, 2010, treated initially     with Lovenox and then Coumadin.  There was a gap of approximately 2     weeks from the Lovenox to the Coumadin where she was totally     nontherapeutic. 2. Homocystinemia, now normal on B vitamins and folic acid which I     think she needs to continue for life. 3. Phlebitis of the foot in her 20s while on birth control pills with     resolution after discontinuation of the birth control pills.  She     was also on hormone replacement therapy at the time of this deep     vein thrombosis in June 2012 and that has also been stopped. 4. Chronic obstructive pulmonary disease secondary to longstanding     smoking history, still smoking about half a pack to a pack a day     and she is encouraged once again to quit. 5. 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 6. Hypercholesterolemia. 7. History of hypertension.  [name] is probably a rapid acetylator of Coumadin reflected in her dose of 29 mg of Coumadin daily to get her INR therapeutic, but come the 31st of January, she will have 6 full months of uninterrupted therapeutic intervention with both Lovenox and Coumadin.  Since she does not have a family history and did not have a positive workup on this hypercoagulable panel other than a homocystine level, I think I would not give her lifelong Coumadin unless she has a relapse and she understands that very clearly.  She can resume her aspirin which she takes prophylactically come the 1st of February.  She can resume her activities and I have encouraged her to maintain on active lifestyle with a regular exercise program but she has not opted to do that just yet.  I have encouraged her, of course, to quit smoking.  PHYSICAL EXAMINATION:  Vital signs:  Today are all very  stable.  She is a little overweight for her height.  She is 215 pounds today, BMI 31.6. She denies any pain.  Other vital signs are stable.  So we will stop the Coumadin on the 31st.  That will be her last dose. We will see her 6 months later which will be the 1st of February and we will just follow her probably every 6 months for a couple of years and then release her.  I do not want to not have any follow up with her.  I would like to at least follow her for a couple of years and make sure she is okay and then release her in a couple of years if there is no recurrence.  She will continue her folic acid 1 mg b.i.d., her B complex vitamin she gets over the counter.  She can resume her calcium and vitamin D, etc.  She can resume her Omega-3 fatty acids.    ______________________________ [name]. Shelley Sleet, MD ESN/MEDQ  D:  04/24/2011  T:  04/24/2011  Job:  [id number]

## 2011-04-25 ENCOUNTER — Encounter (HOSPITAL_BASED_OUTPATIENT_CLINIC_OR_DEPARTMENT_OTHER): Payer: Medicare Other

## 2011-04-25 DIAGNOSIS — I82409 Acute embolism and thrombosis of unspecified deep veins of unspecified lower extremity: Secondary | ICD-10-CM

## 2011-04-25 NOTE — Progress Notes (Signed)
Labs drawn today for pt.  Patient on 29mg of coud.  Call patient at [phone] 

## 2011-05-01 DIAGNOSIS — E119 Type 2 diabetes mellitus without complications: Secondary | ICD-10-CM | POA: Diagnosis not present

## 2011-05-01 DIAGNOSIS — E1159 Type 2 diabetes mellitus with other circulatory complications: Secondary | ICD-10-CM | POA: Diagnosis not present

## 2011-06-18 DIAGNOSIS — M47817 Spondylosis without myelopathy or radiculopathy, lumbosacral region: Secondary | ICD-10-CM | POA: Diagnosis not present

## 2011-06-26 ENCOUNTER — Other Ambulatory Visit (HOSPITAL_COMMUNITY): Payer: Self-pay | Admitting: Family Medicine

## 2011-06-26 ENCOUNTER — Ambulatory Visit (HOSPITAL_COMMUNITY)
Admission: RE | Admit: 2011-06-26 | Discharge: 2011-06-26 | Disposition: A | Discharge status: Home / Self Care | Payer: Medicare Other | Source: Ambulatory Visit | Attending: Family Medicine | Admitting: Family Medicine

## 2011-06-26 DIAGNOSIS — E785 Hyperlipidemia, unspecified: Secondary | ICD-10-CM

## 2011-06-26 DIAGNOSIS — N189 Chronic kidney disease, unspecified: Secondary | ICD-10-CM | POA: Diagnosis not present

## 2011-06-26 DIAGNOSIS — E119 Type 2 diabetes mellitus without complications: Secondary | ICD-10-CM

## 2011-06-26 DIAGNOSIS — J069 Acute upper respiratory infection, unspecified: Secondary | ICD-10-CM | POA: Diagnosis not present

## 2011-06-26 DIAGNOSIS — J4489 Other specified chronic obstructive pulmonary disease: Secondary | ICD-10-CM | POA: Insufficient documentation

## 2011-06-26 DIAGNOSIS — J449 Chronic obstructive pulmonary disease, unspecified: Secondary | ICD-10-CM | POA: Diagnosis not present

## 2011-06-26 DIAGNOSIS — R0989 Other specified symptoms and signs involving the circulatory and respiratory systems: Secondary | ICD-10-CM | POA: Insufficient documentation

## 2011-06-26 DIAGNOSIS — R059 Cough, unspecified: Secondary | ICD-10-CM | POA: Insufficient documentation

## 2011-06-26 DIAGNOSIS — I10 Essential (primary) hypertension: Secondary | ICD-10-CM

## 2011-06-26 DIAGNOSIS — J209 Acute bronchitis, unspecified: Secondary | ICD-10-CM | POA: Diagnosis not present

## 2011-06-26 DIAGNOSIS — R05 Cough: Secondary | ICD-10-CM | POA: Diagnosis not present

## 2011-06-26 DIAGNOSIS — I517 Cardiomegaly: Secondary | ICD-10-CM | POA: Diagnosis not present

## 2011-06-26 DIAGNOSIS — Z6841 Body Mass Index (BMI) 40.0 and over, adult: Secondary | ICD-10-CM | POA: Diagnosis not present

## 2011-07-11 DIAGNOSIS — E1159 Type 2 diabetes mellitus with other circulatory complications: Secondary | ICD-10-CM | POA: Diagnosis not present

## 2011-07-11 DIAGNOSIS — E119 Type 2 diabetes mellitus without complications: Secondary | ICD-10-CM | POA: Diagnosis not present

## 2011-08-08 DIAGNOSIS — I82409 Acute embolism and thrombosis of unspecified deep veins of unspecified lower extremity: Secondary | ICD-10-CM | POA: Diagnosis not present

## 2011-08-08 DIAGNOSIS — E119 Type 2 diabetes mellitus without complications: Secondary | ICD-10-CM | POA: Diagnosis not present

## 2011-08-08 DIAGNOSIS — E782 Mixed hyperlipidemia: Secondary | ICD-10-CM | POA: Diagnosis not present

## 2011-08-08 DIAGNOSIS — I10 Essential (primary) hypertension: Secondary | ICD-10-CM | POA: Diagnosis not present

## 2011-09-19 DIAGNOSIS — E1159 Type 2 diabetes mellitus with other circulatory complications: Secondary | ICD-10-CM | POA: Diagnosis not present

## 2011-09-19 DIAGNOSIS — E119 Type 2 diabetes mellitus without complications: Secondary | ICD-10-CM | POA: Diagnosis not present

## 2011-10-01 DIAGNOSIS — L0390 Cellulitis, unspecified: Secondary | ICD-10-CM | POA: Diagnosis not present

## 2011-10-01 DIAGNOSIS — L0291 Cutaneous abscess, unspecified: Secondary | ICD-10-CM | POA: Diagnosis not present

## 2011-10-01 DIAGNOSIS — Z6834 Body mass index (BMI) 34.0-34.9, adult: Secondary | ICD-10-CM | POA: Diagnosis not present

## 2011-10-29 ENCOUNTER — Ambulatory Visit (HOSPITAL_COMMUNITY)
Admission: RE | Admit: 2011-10-29 | Discharge: 2011-10-29 | Disposition: A | Discharge status: Home / Self Care | Payer: Medicare Other | Source: Ambulatory Visit | Attending: Family Medicine | Admitting: Family Medicine

## 2011-10-29 ENCOUNTER — Other Ambulatory Visit (HOSPITAL_COMMUNITY): Payer: Self-pay | Admitting: Family Medicine

## 2011-10-29 DIAGNOSIS — M949 Disorder of cartilage, unspecified: Secondary | ICD-10-CM | POA: Insufficient documentation

## 2011-10-29 DIAGNOSIS — M25549 Pain in joints of unspecified hand: Secondary | ICD-10-CM

## 2011-10-29 DIAGNOSIS — M109 Gout, unspecified: Secondary | ICD-10-CM | POA: Diagnosis not present

## 2011-10-29 DIAGNOSIS — M899 Disorder of bone, unspecified: Secondary | ICD-10-CM | POA: Diagnosis not present

## 2011-10-29 DIAGNOSIS — M79609 Pain in unspecified limb: Secondary | ICD-10-CM | POA: Insufficient documentation

## 2011-10-29 DIAGNOSIS — R350 Frequency of micturition: Secondary | ICD-10-CM | POA: Diagnosis not present

## 2011-10-29 DIAGNOSIS — Z6834 Body mass index (BMI) 34.0-34.9, adult: Secondary | ICD-10-CM | POA: Diagnosis not present

## 2011-10-29 DIAGNOSIS — I10 Essential (primary) hypertension: Secondary | ICD-10-CM | POA: Diagnosis not present

## 2011-10-29 DIAGNOSIS — E785 Hyperlipidemia, unspecified: Secondary | ICD-10-CM | POA: Diagnosis not present

## 2011-11-21 ENCOUNTER — Encounter (HOSPITAL_COMMUNITY): Payer: Medicare Other | Attending: Oncology | Admitting: Oncology

## 2011-11-21 ENCOUNTER — Encounter (HOSPITAL_COMMUNITY): Payer: Self-pay | Admitting: Oncology

## 2011-11-21 VITALS — BP 152/83 | HR 73 | Temp 97.8°F | Resp 16 | Wt 222.4 lb

## 2011-11-21 DIAGNOSIS — I82409 Acute embolism and thrombosis of unspecified deep veins of unspecified lower extremity: Secondary | ICD-10-CM | POA: Diagnosis not present

## 2011-11-21 DIAGNOSIS — J449 Chronic obstructive pulmonary disease, unspecified: Secondary | ICD-10-CM | POA: Diagnosis not present

## 2011-11-21 DIAGNOSIS — R7983 Abnormal findings of blood amino-acid level: Secondary | ICD-10-CM

## 2011-11-21 DIAGNOSIS — J4489 Other specified chronic obstructive pulmonary disease: Secondary | ICD-10-CM

## 2011-11-21 DIAGNOSIS — I10 Essential (primary) hypertension: Secondary | ICD-10-CM | POA: Diagnosis not present

## 2011-11-21 DIAGNOSIS — E7210 Disorders of sulfur-bearing amino-acid metabolism, unspecified: Secondary | ICD-10-CM

## 2011-11-21 NOTE — Progress Notes (Signed)
Problem number 1 DVT of the right leg after trauma in early June 2012 but diagnosed in 10/05/2010 treated initially with Lovenox and then Coumadin and the Coumadin for a total of 6 months of full anticoagulation. Problem #2 homocystinemia now normal on her B. vitamins and folic acid daily. Problem #3 phlebitis of the foot in her 20s well and BCPs. She was also on hormone replacement therapy the time of the DVT in June 2012 and that has been stopped. Problem #4 COPD secondary to long-standing smoking history. Problem #5 degenerative joint disease of the neck status post surgery by Dr. Channing Mutters she also has DJD of the lower spine. Problem #6 hypercholesterolemia Problem #7 right renal artery occlusion with significant shrinkage of the right kidney Problem #8 history of hypertension  Her legs are symmetrical in size now there is no swelling of the calves she has no tenderness to the calves and she has no tenderness behind the knees and both knees appear symmetrical. She is not short of breath any more or less and she has been secondary to her COPD.  I will see her every 6 months for couple years especially since her son was diagnosed with a huge pulmonary embolus.[name]

## 2011-11-21 NOTE — Patient Instructions (Addendum)
[name]  DOB Aug 03, 1945 CSN [id number]  MRN [id number] Dr. Glenford Peers   [facility]  Discharge Instructions  RECOMMENDATIONS MADE BY THE CONSULTANT AND ANY TEST RESULTS WILL BE SENT TO YOUR REFERRING DOCTOR.   EXAM FINDINGS BY MD TODAY AND SIGNS AND SYMPTOMS TO REPORT TO CLINIC OR PRIMARY MD: discussion per MD.  Bonita Quin are doing well.    MEDICATIONS PRESCRIBED: none   INSTRUCTIONS GIVEN AND DISCUSSED: Other :  Report any sudden onset of chest pain or shortness of breath (after going to ED),swelling and pain of extremity, etc.  SPECIAL INSTRUCTIONS/FOLLOW-UP: Lab work Needed in February and Return to Clinic after labs in February.   I acknowledge that I have been informed and understand all the instructions given to me and received a copy. I do not have any more questions at this time, but understand that I may call the Specialty Clinic at [facility] at [phone] during business hours should I have any further questions or need assistance in obtaining follow-up care.    __________________________________________  _____________  __________ Signature of Patient or Authorized Representative            Date                   Time    __________________________________________ Nurse's Signature

## 2011-11-27 DIAGNOSIS — R32 Unspecified urinary incontinence: Secondary | ICD-10-CM | POA: Diagnosis not present

## 2011-11-27 DIAGNOSIS — N269 Renal sclerosis, unspecified: Secondary | ICD-10-CM | POA: Diagnosis not present

## 2011-11-27 DIAGNOSIS — N3946 Mixed incontinence: Secondary | ICD-10-CM | POA: Diagnosis not present

## 2011-11-28 ENCOUNTER — Other Ambulatory Visit (HOSPITAL_COMMUNITY): Payer: Self-pay | Admitting: Urology

## 2011-11-28 DIAGNOSIS — N261 Atrophy of kidney (terminal): Secondary | ICD-10-CM

## 2011-11-28 DIAGNOSIS — E1159 Type 2 diabetes mellitus with other circulatory complications: Secondary | ICD-10-CM | POA: Diagnosis not present

## 2011-11-28 DIAGNOSIS — E119 Type 2 diabetes mellitus without complications: Secondary | ICD-10-CM | POA: Diagnosis not present

## 2011-12-12 ENCOUNTER — Encounter (HOSPITAL_COMMUNITY)
Admission: RE | Admit: 2011-12-12 | Discharge: 2011-12-12 | Disposition: A | Discharge status: Home / Self Care | Payer: Medicare Other | Source: Ambulatory Visit | Attending: Urology | Admitting: Urology

## 2011-12-12 DIAGNOSIS — N269 Renal sclerosis, unspecified: Secondary | ICD-10-CM | POA: Diagnosis not present

## 2011-12-12 DIAGNOSIS — N261 Atrophy of kidney (terminal): Secondary | ICD-10-CM

## 2011-12-12 MED ORDER — TECHNETIUM TC 99M MERTIATIDE
15.8000 | Freq: Once | INTRAVENOUS | Status: AC | PRN
  Administered 2011-12-12: 16 via INTRAVENOUS

## 2011-12-12 MED ORDER — FUROSEMIDE 10 MG/ML IJ SOLN
40.0000 mg | Freq: Once | INTRAMUSCULAR | Status: AC
  Administered 2011-12-12: 40 mg via INTRAVENOUS
  Filled 2011-12-12: qty 4

## 2011-12-17 ENCOUNTER — Other Ambulatory Visit (HOSPITAL_COMMUNITY): Payer: Medicare Other

## 2011-12-24 ENCOUNTER — Emergency Department (HOSPITAL_COMMUNITY)
Admission: EM | Admit: 2011-12-24 | Discharge: 2011-12-24 | Disposition: A | Discharge status: Home / Self Care | Payer: Medicare Other | Attending: Emergency Medicine | Admitting: Emergency Medicine

## 2011-12-24 ENCOUNTER — Emergency Department (HOSPITAL_COMMUNITY): Payer: Medicare Other

## 2011-12-24 ENCOUNTER — Encounter (HOSPITAL_COMMUNITY): Payer: Self-pay | Admitting: *Deleted

## 2011-12-24 DIAGNOSIS — Z833 Family history of diabetes mellitus: Secondary | ICD-10-CM | POA: Diagnosis not present

## 2011-12-24 DIAGNOSIS — Z90710 Acquired absence of both cervix and uterus: Secondary | ICD-10-CM | POA: Insufficient documentation

## 2011-12-24 DIAGNOSIS — M1990 Unspecified osteoarthritis, unspecified site: Secondary | ICD-10-CM | POA: Insufficient documentation

## 2011-12-24 DIAGNOSIS — Z809 Family history of malignant neoplasm, unspecified: Secondary | ICD-10-CM | POA: Diagnosis not present

## 2011-12-24 DIAGNOSIS — E119 Type 2 diabetes mellitus without complications: Secondary | ICD-10-CM | POA: Diagnosis not present

## 2011-12-24 DIAGNOSIS — M79609 Pain in unspecified limb: Secondary | ICD-10-CM | POA: Diagnosis not present

## 2011-12-24 DIAGNOSIS — I10 Essential (primary) hypertension: Secondary | ICD-10-CM | POA: Insufficient documentation

## 2011-12-24 DIAGNOSIS — M7120 Synovial cyst of popliteal space [Baker], unspecified knee: Secondary | ICD-10-CM | POA: Insufficient documentation

## 2011-12-24 DIAGNOSIS — M79606 Pain in leg, unspecified: Secondary | ICD-10-CM

## 2011-12-24 DIAGNOSIS — Z823 Family history of stroke: Secondary | ICD-10-CM | POA: Insufficient documentation

## 2011-12-24 DIAGNOSIS — M1710 Unilateral primary osteoarthritis, unspecified knee: Secondary | ICD-10-CM | POA: Diagnosis not present

## 2011-12-24 LAB — CBC WITH DIFFERENTIAL/PLATELET
Basophils Relative: 0 % (ref 0–1)
Eosinophils Absolute: 0.1 10*3/uL (ref 0.0–0.7)
Eosinophils Relative: 1 % (ref 0–5)
MCH: 30.2 pg (ref 26.0–34.0)
MCHC: 33.6 g/dL (ref 30.0–36.0)
MCV: 89.9 fL (ref 78.0–100.0)
Monocytes Relative: 9 % (ref 3–12)
Neutrophils Relative %: 61 % (ref 43–77)
Platelets: 253 10*3/uL (ref 150–400)

## 2011-12-24 LAB — BASIC METABOLIC PANEL
BUN: 13 mg/dL (ref 6–23)
Calcium: 10.2 mg/dL (ref 8.4–10.5)
GFR calc Af Amer: 62 mL/min — ABNORMAL LOW (ref >90)
GFR calc non Af Amer: 53 mL/min — ABNORMAL LOW (ref >90)
Potassium: 3.5 mEq/L (ref 3.5–5.1)
Sodium: 138 mEq/L (ref 135–145)

## 2011-12-24 MED ORDER — MORPHINE SULFATE 4 MG/ML IJ SOLN
4.0000 mg | Freq: Once | INTRAMUSCULAR | Status: AC
  Administered 2011-12-24: 4 mg via INTRAVENOUS
  Filled 2011-12-24: qty 1

## 2011-12-24 MED ORDER — OXYCODONE-ACETAMINOPHEN 5-325 MG PO TABS
1.0000 | ORAL_TABLET | Freq: Once | ORAL | Status: AC
  Administered 2011-12-24: 1 via ORAL
  Filled 2011-12-24: qty 1

## 2011-12-24 MED ORDER — OXYCODONE-ACETAMINOPHEN 5-325 MG PO TABS: 1.0000 | ORAL_TABLET | Freq: Four times a day (QID) | ORAL | Status: DC | PRN

## 2011-12-24 MED ORDER — ONDANSETRON HCL 4 MG/2ML IJ SOLN
4.0000 mg | Freq: Once | INTRAMUSCULAR | Status: AC
  Administered 2011-12-24: 4 mg via INTRAVENOUS
  Filled 2011-12-24: qty 2

## 2011-12-24 NOTE — ED Notes (Signed)
Pain rt leg , especially at popliteal area,   Hx of clot in this leg, Stopped coumadin in Jan.  Hurts from back , down entire leg. No injury

## 2011-12-24 NOTE — ED Provider Notes (Signed)
History   This chart was scribed for [employer]. Rubin Payor, MD by Gerlean Ren. This patient was seen in room APA10/APA10 and the patient's care was started at 3:49PM.   CSN: [id number]  Arrival date & time 12/24/11  1458   First MD Initiated Contact with Patient 12/24/11 1546      Chief Complaint  Patient presents with  . Leg Pain    (Consider location/radiation/quality/duration/timing/severity/associated sxs/prior treatment) The history is provided by the patient. No language interpreter was used.   [name] is a 66 y.o. female with a h/o blood clots in right lower extremity who presents to the Emergency Department complaining of 3 days of sudden onset, gradually worsening, non-radiating right leg pain that she reports feels similar to pain experienced with previous blood clots.  Pt denies any recent trauma to the right leg as a cause of pain.  Pt is unable to put any weight on right leg.  Pt is a current everyday smoker (1pack/day) but denies alcohol use.   Past Medical History  Diagnosis Date  . Hypertension   . Diabetes mellitus     non insulin  . Arthritis   . History of blood clots   . Homocysteinemia 11/29/2010  . History of gout     right ring finger  . Left knee pain   . Kidney atrophy     rt.    Past Surgical History  Procedure Date  . Abdominal hysterectomy   . Knee surgery     rt. arthroscopic  . Tonsillectomy   . Hypertension   . Type ll diabetes   . Neck surgery     Family History  Problem Relation Age of Onset  . Stroke Mother   . Cancer Father   . Diabetes Maternal Aunt   . Diabetes Maternal Uncle   . Diabetes Maternal Grandmother     History  Substance Use Topics  . Smoking status: Current Every Day Smoker -- 1.0 packs/day    Types: Cigarettes  . Smokeless tobacco: Never Used  . Alcohol Use: No    No OB history provided.  Review of Systems  Constitutional: Negative for fever.  HENT: Negative for neck pain.   Eyes: Negative for  visual disturbance.  Respiratory: Negative for shortness of breath.   Cardiovascular: Negative for chest pain.  Gastrointestinal: Negative for abdominal pain.  Genitourinary: Negative for dysuria.    Allergies  Ace inhibitors; Celecoxib; and Hydrocodone-acetaminophen  Home Medications   Current Outpatient Rx  Name Route Sig Dispense Refill  . VITAMIN C 1000 MG PO TABS Oral Take 1,000 mg by mouth daily.    . ASPIRIN 81 MG PO TABS Oral Take 81 mg by mouth every evening.     . B COMPLEX PO TABS Oral Take 1 tablet by mouth 2 (two) times daily. Takes 2 in the morning.    Marland Kitchen VITAMIN D3 1000 UNITS PO CAPS Oral Take 1 tablet by mouth daily.      . DEXLANSOPRAZOLE 60 MG PO CPDR Oral Take 60 mg by mouth every evening.     Marland Kitchen FOLIC ACID 1 MG PO TABS Oral Take 1 mg by mouth daily.    Marland Kitchen GLIMEPIRIDE 2 MG PO TABS Oral Take 2 mg by mouth 2 (two) times daily.    Marland Kitchen LIDOCAINE 5 % EX PTCH Transdermal Place 1 patch onto the skin daily. Remove & Discard patch within 12 hours or as directed by MD    . LINAGLIPTIN 5 MG PO  TABS Oral Take 5 mg by mouth daily.      Marland Kitchen NIFEDIPINE ER OSMOTIC 60 MG PO TB24 Oral Take 60 mg by mouth daily.      Marland Kitchen PIOGLITAZONE HCL 30 MG PO TABS Oral Take 30 mg by mouth daily.      Marland Kitchen PROVENTIL HFA 108 (90 BASE) MCG/ACT IN AERS Inhalation Inhale 1 puff into the lungs daily as needed. For shortness of breath/wheezing    . SIMVASTATIN 20 MG PO TABS Oral Take 20 mg by mouth At bedtime.    . OXYCODONE-ACETAMINOPHEN 5-325 MG PO TABS Oral Take 1 tablet by mouth every 6 (six) hours as needed.     . OXYCODONE-ACETAMINOPHEN 5-325 MG PO TABS Oral Take 1-2 tablets by mouth every 6 (six) hours as needed for pain. 10 tablet 0    BP 151/72  Pulse 76  Temp 98.1 F (36.7 C) (Oral)  Resp 18  Ht 5\' 10"  (1.778 m)  Wt 220 lb (99.791 kg)  BMI 31.57 kg/m2  SpO2 95%  Physical Exam  Nursing note and vitals reviewed. Constitutional: She is oriented to person, place, and time. She appears well-developed  and well-nourished.  HENT:  Head: Normocephalic and atraumatic.  Eyes: Conjunctivae normal are normal.  Neck: Normal range of motion. Neck supple. No tracheal deviation present.  Cardiovascular: Normal rate, regular rhythm and normal heart sounds.   Pulmonary/Chest: Effort normal and breath sounds normal. She has no wheezes.  Abdominal: Soft. Bowel sounds are normal. There is no tenderness. There is no rebound.  Musculoskeletal: She exhibits edema and tenderness.       Diffuse edema in right lower extremity.  No erythema.  No induration. Strong DP pulse.  Not an irritated right hip. No lumbar tenderness. Severe tenderness in right lower extremity joints and muscles with any movement.    Neurological: She is alert and oriented to person, place, and time.  Skin: Skin is warm and dry.  Psychiatric: She has a normal mood and affect.    ED Course  Procedures (including critical care time)  COORDINATION OF CARE: 3:51PM- Ordered morphine and zofran.   Labs Reviewed  BASIC METABOLIC PANEL - Abnormal; Notable for the following:    Glucose, Bld 126 (*)     GFR calc non Af Amer 53 (*)     GFR calc Af Amer 62 (*)     All other components within normal limits  CBC WITH DIFFERENTIAL  CK   US Venous Img Lower Unilateral Right  12/24/2011  *RADIOLOGY REPORT*  Clinical data:  History of right lower extremity DVT in 2012 (popliteal vein), presenting with recurrent right lower extremity pain.  RIGHT LOWER EXTREMITY VENOUS DOPPLER ULTRASOUND  Technique:  Gray-scale sonography with compression, as well as color and duplex Doppler ultrasound, were performed to evaluate the deep venous system from the level of the common femoral vein through the popliteal and proximal calf veins. Evaluation also included physiologic response to augmentation.  Comparison: Right lower extremity venous Doppler ultrasound 11/02/2010, 10/05/2010.  Findings: All of the visualized deep veins in the right lower extremity  demonstrate normal Doppler signal, normal compressibility, normal phasicity, and normal physiologic response to augmentation.  No gray scale filling defects were identified. Interval recanalization of the popliteal vein without evidence of chronic thrombus.  Visualized greater saphenous vein patent in its visualized course in the leg.  Note made of an approximate 3.5 x 1.0 x 2.2 cm cyst in the popliteal fossa.  IMPRESSION:  1.  No  evidence of right lower extremity DVT or superficial thrombophlebitis. 2.  Interval recanalization of the popliteal vein since 2012, without evidence of residual chronic thrombus. 3.  Baker's cyst in the right popliteal fossa, measured above.   Original Report Authenticated By: Arnell Sieving, M.D.    Dg Knee Complete 4 Views Right  12/24/2011  *RADIOLOGY REPORT*  Clinical Data: Right knee pain.  RIGHT KNEE - COMPLETE 4+ VIEW  Comparison: None.  Findings: Four views of the right knee demonstrate medial joint space narrowing with osteophyte formations.  There is a suprapatellar joint effusion and extensive degenerative changes in the patellofemoral compartment of the knee.  Negative for acute fracture or dislocation.  IMPRESSION: Moderate osteoarthritis in the right knee with a suprapatellar joint effusion.   Original Report Authenticated By: Richarda Overlie, M.D.      1. Lower extremity pain   2. Baker's cyst       MDM  Patient with right knee pain. No trauma. Previous DVT, but negative Doppler now. She does have some osteoarthritis and effusion. Pain is improved after pain medicines. She'll be discharged home with some Percocet to use as needed. She'll follow with her Dr. Gerald Stabs work is reassuring. I personally performed the services described in this documentation, which was scribed in my presence. The recorded information has been reviewed and considered.         Juliet Rude. Rubin Payor, MD 12/24/11 2003

## 2011-12-24 NOTE — ED Notes (Signed)
RN questioned patient about allergy to hydrocodone/apap versus Oxycodone/apap.  She states it is just the vicodin that makes her itch  Has taken percocet in the past without any issues.

## 2011-12-25 DIAGNOSIS — M25569 Pain in unspecified knee: Secondary | ICD-10-CM | POA: Diagnosis not present

## 2011-12-25 DIAGNOSIS — M109 Gout, unspecified: Secondary | ICD-10-CM | POA: Diagnosis not present

## 2012-01-02 DIAGNOSIS — I10 Essential (primary) hypertension: Secondary | ICD-10-CM | POA: Diagnosis not present

## 2012-01-02 DIAGNOSIS — M25569 Pain in unspecified knee: Secondary | ICD-10-CM | POA: Diagnosis not present

## 2012-01-04 ENCOUNTER — Other Ambulatory Visit (HOSPITAL_COMMUNITY): Payer: Self-pay | Admitting: Orthopaedic Surgery

## 2012-01-04 DIAGNOSIS — M25561 Pain in right knee: Secondary | ICD-10-CM

## 2012-01-08 ENCOUNTER — Ambulatory Visit (HOSPITAL_COMMUNITY)
Admission: RE | Admit: 2012-01-08 | Discharge: 2012-01-08 | Disposition: A | Discharge status: Home / Self Care | Payer: Medicare Other | Source: Ambulatory Visit | Attending: Orthopaedic Surgery | Admitting: Orthopaedic Surgery

## 2012-01-08 DIAGNOSIS — R937 Abnormal findings on diagnostic imaging of other parts of musculoskeletal system: Secondary | ICD-10-CM | POA: Insufficient documentation

## 2012-01-08 DIAGNOSIS — IMO0002 Reserved for concepts with insufficient information to code with codable children: Secondary | ICD-10-CM | POA: Diagnosis not present

## 2012-01-08 DIAGNOSIS — M899 Disorder of bone, unspecified: Secondary | ICD-10-CM | POA: Insufficient documentation

## 2012-01-08 DIAGNOSIS — M25569 Pain in unspecified knee: Secondary | ICD-10-CM | POA: Diagnosis not present

## 2012-01-08 DIAGNOSIS — M949 Disorder of cartilage, unspecified: Secondary | ICD-10-CM | POA: Insufficient documentation

## 2012-01-08 DIAGNOSIS — M25469 Effusion, unspecified knee: Secondary | ICD-10-CM | POA: Insufficient documentation

## 2012-01-08 DIAGNOSIS — M25561 Pain in right knee: Secondary | ICD-10-CM

## 2012-01-08 DIAGNOSIS — M7120 Synovial cyst of popliteal space [Baker], unspecified knee: Secondary | ICD-10-CM | POA: Diagnosis not present

## 2012-01-08 DIAGNOSIS — M1710 Unilateral primary osteoarthritis, unspecified knee: Secondary | ICD-10-CM | POA: Insufficient documentation

## 2012-01-09 DIAGNOSIS — N3946 Mixed incontinence: Secondary | ICD-10-CM | POA: Diagnosis not present

## 2012-01-09 DIAGNOSIS — N269 Renal sclerosis, unspecified: Secondary | ICD-10-CM | POA: Diagnosis not present

## 2012-01-11 DIAGNOSIS — M25569 Pain in unspecified knee: Secondary | ICD-10-CM | POA: Diagnosis not present

## 2012-01-22 DIAGNOSIS — Z23 Encounter for immunization: Secondary | ICD-10-CM | POA: Diagnosis not present

## 2012-02-08 DIAGNOSIS — M25569 Pain in unspecified knee: Secondary | ICD-10-CM | POA: Diagnosis not present

## 2012-02-12 DIAGNOSIS — E1159 Type 2 diabetes mellitus with other circulatory complications: Secondary | ICD-10-CM | POA: Diagnosis not present

## 2012-02-12 DIAGNOSIS — E119 Type 2 diabetes mellitus without complications: Secondary | ICD-10-CM | POA: Diagnosis not present

## 2012-02-12 DIAGNOSIS — E785 Hyperlipidemia, unspecified: Secondary | ICD-10-CM | POA: Diagnosis not present

## 2012-02-12 DIAGNOSIS — I10 Essential (primary) hypertension: Secondary | ICD-10-CM | POA: Diagnosis not present

## 2012-02-12 DIAGNOSIS — Z6835 Body mass index (BMI) 35.0-35.9, adult: Secondary | ICD-10-CM | POA: Diagnosis not present

## 2012-02-12 DIAGNOSIS — N182 Chronic kidney disease, stage 2 (mild): Secondary | ICD-10-CM | POA: Diagnosis not present

## 2012-02-12 DIAGNOSIS — E1129 Type 2 diabetes mellitus with other diabetic kidney complication: Secondary | ICD-10-CM | POA: Diagnosis not present

## 2012-03-10 DIAGNOSIS — E782 Mixed hyperlipidemia: Secondary | ICD-10-CM | POA: Diagnosis not present

## 2012-03-10 DIAGNOSIS — E119 Type 2 diabetes mellitus without complications: Secondary | ICD-10-CM | POA: Diagnosis not present

## 2012-03-10 DIAGNOSIS — I10 Essential (primary) hypertension: Secondary | ICD-10-CM | POA: Diagnosis not present

## 2012-03-31 ENCOUNTER — Telehealth (HOSPITAL_COMMUNITY): Payer: Self-pay | Admitting: Dietician

## 2012-04-01 NOTE — Telephone Encounter (Signed)
Pt left message on 03/25/12 inquiring about DM classes. Returned call on 03/31/12 and left message with pt daughter.

## 2012-04-04 DIAGNOSIS — M25569 Pain in unspecified knee: Secondary | ICD-10-CM | POA: Diagnosis not present

## 2012-04-04 DIAGNOSIS — I10 Essential (primary) hypertension: Secondary | ICD-10-CM | POA: Diagnosis not present

## 2012-05-05 ENCOUNTER — Other Ambulatory Visit (HOSPITAL_COMMUNITY): Payer: Self-pay | Admitting: Oncology

## 2012-05-05 DIAGNOSIS — R7983 Abnormal findings of blood amino-acid level: Secondary | ICD-10-CM

## 2012-05-05 MED ORDER — FOLIC ACID 1 MG PO TABS: 1.0000 mg | ORAL_TABLET | Freq: Every day | ORAL | Status: DC

## 2012-05-07 DIAGNOSIS — E1159 Type 2 diabetes mellitus with other circulatory complications: Secondary | ICD-10-CM | POA: Diagnosis not present

## 2012-05-07 DIAGNOSIS — E119 Type 2 diabetes mellitus without complications: Secondary | ICD-10-CM | POA: Diagnosis not present

## 2012-05-13 DIAGNOSIS — M67919 Unspecified disorder of synovium and tendon, unspecified shoulder: Secondary | ICD-10-CM | POA: Diagnosis not present

## 2012-05-13 DIAGNOSIS — M719 Bursopathy, unspecified: Secondary | ICD-10-CM | POA: Diagnosis not present

## 2012-05-13 DIAGNOSIS — M47812 Spondylosis without myelopathy or radiculopathy, cervical region: Secondary | ICD-10-CM | POA: Diagnosis not present

## 2012-05-13 DIAGNOSIS — M47817 Spondylosis without myelopathy or radiculopathy, lumbosacral region: Secondary | ICD-10-CM | POA: Diagnosis not present

## 2012-05-19 ENCOUNTER — Encounter (HOSPITAL_COMMUNITY): Payer: Medicare Other | Attending: Oncology

## 2012-05-19 DIAGNOSIS — I82409 Acute embolism and thrombosis of unspecified deep veins of unspecified lower extremity: Secondary | ICD-10-CM | POA: Diagnosis not present

## 2012-05-19 DIAGNOSIS — E7800 Pure hypercholesterolemia, unspecified: Secondary | ICD-10-CM | POA: Insufficient documentation

## 2012-05-19 DIAGNOSIS — E7210 Disorders of sulfur-bearing amino-acid metabolism, unspecified: Secondary | ICD-10-CM | POA: Insufficient documentation

## 2012-05-19 DIAGNOSIS — I10 Essential (primary) hypertension: Secondary | ICD-10-CM | POA: Diagnosis not present

## 2012-05-19 DIAGNOSIS — J4489 Other specified chronic obstructive pulmonary disease: Secondary | ICD-10-CM | POA: Insufficient documentation

## 2012-05-19 DIAGNOSIS — R7983 Abnormal findings of blood amino-acid level: Secondary | ICD-10-CM

## 2012-05-19 DIAGNOSIS — J449 Chronic obstructive pulmonary disease, unspecified: Secondary | ICD-10-CM | POA: Insufficient documentation

## 2012-05-19 LAB — CBC WITH DIFFERENTIAL/PLATELET
Basophils Absolute: 0 10*3/uL (ref 0.0–0.1)
Basophils Relative: 0 % (ref 0–1)
Eosinophils Absolute: 0 10*3/uL (ref 0.0–0.7)
Eosinophils Relative: 1 % (ref 0–5)
Lymphs Abs: 1.2 10*3/uL (ref 0.7–4.0)
MCH: 29.6 pg (ref 26.0–34.0)
Neutrophils Relative %: 64 % (ref 43–77)
Platelets: 269 10*3/uL (ref 150–400)
RBC: 4.19 MIL/uL (ref 3.87–5.11)
RDW: 15.2 % (ref 11.5–15.5)

## 2012-05-19 LAB — HOMOCYSTEINE: Homocysteine: 14.3 umol/L (ref 4.0–15.4)

## 2012-05-19 NOTE — Progress Notes (Signed)
Labs drawn today for cbc/diff,homocysteine

## 2012-05-20 ENCOUNTER — Ambulatory Visit (HOSPITAL_COMMUNITY)
Admission: RE | Admit: 2012-05-20 | Discharge: 2012-05-20 | Disposition: A | Discharge status: Home / Self Care | Payer: Medicare Other | Source: Ambulatory Visit | Attending: Family Medicine | Admitting: Family Medicine

## 2012-05-20 ENCOUNTER — Other Ambulatory Visit (HOSPITAL_COMMUNITY): Payer: Self-pay | Admitting: Family Medicine

## 2012-05-20 DIAGNOSIS — E785 Hyperlipidemia, unspecified: Secondary | ICD-10-CM | POA: Diagnosis not present

## 2012-05-20 DIAGNOSIS — R062 Wheezing: Secondary | ICD-10-CM | POA: Diagnosis not present

## 2012-05-20 DIAGNOSIS — R05 Cough: Secondary | ICD-10-CM | POA: Insufficient documentation

## 2012-05-20 DIAGNOSIS — R059 Cough, unspecified: Secondary | ICD-10-CM | POA: Diagnosis not present

## 2012-05-20 DIAGNOSIS — I10 Essential (primary) hypertension: Secondary | ICD-10-CM | POA: Diagnosis not present

## 2012-05-20 DIAGNOSIS — R0602 Shortness of breath: Secondary | ICD-10-CM | POA: Diagnosis not present

## 2012-05-20 DIAGNOSIS — E1029 Type 1 diabetes mellitus with other diabetic kidney complication: Secondary | ICD-10-CM | POA: Diagnosis not present

## 2012-05-20 DIAGNOSIS — Z6834 Body mass index (BMI) 34.0-34.9, adult: Secondary | ICD-10-CM | POA: Diagnosis not present

## 2012-05-23 ENCOUNTER — Ambulatory Visit (HOSPITAL_COMMUNITY): Payer: Medicare Other | Admitting: Oncology

## 2012-05-23 DIAGNOSIS — M47817 Spondylosis without myelopathy or radiculopathy, lumbosacral region: Secondary | ICD-10-CM | POA: Diagnosis not present

## 2012-05-23 DIAGNOSIS — M5126 Other intervertebral disc displacement, lumbar region: Secondary | ICD-10-CM | POA: Diagnosis not present

## 2012-05-23 DIAGNOSIS — M48061 Spinal stenosis, lumbar region without neurogenic claudication: Secondary | ICD-10-CM | POA: Diagnosis not present

## 2012-05-26 ENCOUNTER — Encounter (HOSPITAL_COMMUNITY): Payer: Self-pay | Admitting: Oncology

## 2012-05-26 ENCOUNTER — Encounter (HOSPITAL_BASED_OUTPATIENT_CLINIC_OR_DEPARTMENT_OTHER): Payer: Medicare Other | Admitting: Oncology

## 2012-05-26 VITALS — BP 122/69 | HR 86 | Temp 98.2°F | Resp 18 | Wt 223.4 lb

## 2012-05-26 DIAGNOSIS — Z86718 Personal history of other venous thrombosis and embolism: Secondary | ICD-10-CM

## 2012-05-26 DIAGNOSIS — R7983 Abnormal findings of blood amino-acid level: Secondary | ICD-10-CM

## 2012-05-26 DIAGNOSIS — I82401 Acute embolism and thrombosis of unspecified deep veins of right lower extremity: Secondary | ICD-10-CM

## 2012-05-26 NOTE — Patient Instructions (Addendum)
[facility] Discharge Instructions  RECOMMENDATIONS MADE BY THE CONSULTANT AND ANY TEST RESULTS WILL BE SENT TO YOUR REFERRING PHYSICIAN.  EXAM FINDINGS BY THE PHYSICIAN TODAY AND SIGNS OR SYMPTOMS TO REPORT TO CLINIC OR PRIMARY PHYSICIAN: Exam and discussion by PA.  You have a high homocystine level which can cause problems with blood clots.  Call us if you are going to have surgery.  MEDICATIONS PRESCRIBED:  none  INSTRUCTIONS GIVEN AND DISCUSSED: Report unusual swelling, sudden shortness of breath, chest pain,etc. (after going to ED)  SPECIAL INSTRUCTIONS/FOLLOW-UP: Return for follow-up in 6 months with labs and appointment.  Thank you for choosing [facility] to provide your oncology and hematology care.  To afford each patient quality time with our providers, please arrive at least 15 minutes before your scheduled appointment time.  With your help, our goal is to use those 15 minutes to complete the necessary work-up to ensure our physicians have the information they need to help with your evaluation and healthcare recommendations.    Effective January 1st, 2014, we ask that you re-schedule your appointment with our physicians should you arrive 10 or more minutes late for your appointment.  We strive to give you quality time with our providers, and arriving late affects you and other patients whose appointments are after yours.    Again, thank you for choosing [facility].  Our hope is that these requests will decrease the amount of time that you wait before being seen by our physicians.       _____________________________________________________________  Should you have questions after your visit to [facility], please contact our office at [phone] between the hours of 8:30 a.m. and 5:00 p.m.  Voicemails left after 4:30 p.m. will not be returned until the following business day.  For prescription refill requests, have  your pharmacy contact our office with your prescription refill request.

## 2012-05-26 NOTE — Progress Notes (Signed)
Shelley Ribas, MD [address] [place] [address]  Homocysteinemia - Plan: metFORMIN (GLUMETZA) 500 MG (MOD) 24 hr tablet, Flaxseed, Linseed, 1000 MG CAPS, fish oil-omega-3 fatty acids 1000 MG capsule, acetaminophen (TYLENOL) 500 MG tablet, zolpidem (AMBIEN) 10 MG tablet, CBC with Differential, Homocysteine, serum  Deep vein thrombosis (DVT), right - Plan: metFORMIN (GLUMETZA) 500 MG (MOD) 24 hr tablet, Flaxseed, Linseed, 1000 MG CAPS, fish oil-omega-3 fatty acids 1000 MG capsule, acetaminophen (TYLENOL) 500 MG tablet, zolpidem (AMBIEN) 10 MG tablet, CBC with Differential, Homocysteine, serum  CURRENT THERAPY:Observation  INTERVAL HISTORY: MIAMI LATULIPPE 67 y.o. female returns for  regular  visit for followup of DVT of the right leg after trauma in early June 2012 but diagnosed in 10/05/2010 treated initially with Lovenox and then Coumadin.  Anticoagulated with Coumadin for a total of 6 months. She was also on hormone replacement therapy the time of the DVT in June 2012 and that has been stopped. AND Homocystinemia now normal on her B. vitamins and folic acid daily.   Tracie is doing well.  She is having some orthopedic issues being worked on by Dr. Channing Mutters (back) and Dr. Hilda Lias (right knee).  She had an MRI of her right knee and Dr. Hilda Lias has removed an effusion from her right knee.    She is worried about having surgery in the future with her history of blood clot.  She is concerned about having a life-threatening blood clot.  I explained that according to the procedure, she will likely be anticoagulated via the surgeon.  However, we would like to know about any future surgery and date so we may participate or provide our opinion regarding her anticoagulation.   She is concerned that her son, Onalee Hua, has been released from our clinic for his b/l PE.  He was seen at [facility] and it is assumed that his blood clot was secondary to his shoulder surgery.  We  followed their recommendations and released him from the clinic with education about his blood clots since the initiating factor was identified.   I discussed with Aisling that in the future she will likely be released from our clinic as well in the future.   She reports that her right knee has been bothering her and Dr. Hilda Lias is following.  She also has mild right LE edema associated with that, but she denies any tenderness in the calf or popliteal region.  On exam, she has trace to 1+ B/L LE pitting edema.    She remains of her B-complex and folic acid vitamins.   I personally reviewed and went over laboratory results with the patient.  Homocysteine is WNL. She was encouraged to continue her B-Complex and Folic Acid.  CBC is otherwise unremarkable.      Past Medical History  Diagnosis Date  . Hypertension   . Diabetes mellitus     non insulin  . Arthritis   . History of blood clots   . Homocysteinemia 11/29/2010  . History of gout     right ring finger  . Left knee pain   . Kidney atrophy     rt.    has ARTHRITIS, LEFT KNEE; HIGH BLOOD PRESSURE; Deep vein thrombosis (DVT); and Homocysteinemia on her problem list.     is allergic to ace inhibitors; celecoxib; and hydrocodone-acetaminophen.  Ms. Blinn had no medications administered during this visit.  Past Surgical History  Procedure Laterality Date  . Abdominal hysterectomy    .  Knee surgery      rt. arthroscopic  . Tonsillectomy    . Hypertension    . Type ll diabetes    . Neck surgery      Denies any headaches, dizziness, double vision, fevers, chills, night sweats, nausea, vomiting, diarrhea, constipation, chest pain, heart palpitations, shortness of breath, blood in stool, black tarry stool, urinary pain, urinary burning, urinary frequency, hematuria.   PHYSICAL EXAMINATION  ECOG PERFORMANCE STATUS: 0 - Asymptomatic  Filed Vitals:   05/26/12 0939  BP: 122/69  Pulse: 86  Temp: 98.2 F (36.8 C)  Resp: 18     GENERAL:alert, no distress, well nourished, well developed, comfortable, cooperative and smiling SKIN: skin color, texture, turgor are normal, no rashes or significant lesions HEAD: Normocephalic, No masses, lesions, tenderness or abnormalities EYES: normal, Conjunctiva are pink and non-injected EARS: External ears normal OROPHARYNX:mucous membranes are moist  NECK: supple, no adenopathy, thyroid normal size, non-tender, without nodularity, no stridor, non-tender, trachea midline LYMPH:  no palpable lymphadenopathy BREAST:not examined LUNGS: clear to auscultation and percussion, decreased breath sounds HEART: regular rate & rhythm, no murmurs, no gallops, S1 normal and S2 normal ABDOMEN:abdomen soft and normal bowel sounds BACK: Back symmetric, no curvature. EXTREMITIES:less then 2 second capillary refill, no joint deformities, effusion, or inflammation, no skin discoloration, no clubbing, no cyanosis, positive findings:  edema trace to 1+ B/L LE edema  NEURO: alert & oriented x 3 with fluent speech, no focal motor/sensory deficits, gait normal    LABORATORY DATA: CBC    Component Value Date/Time   WBC 5.8 05/19/2012 0935   RBC 4.19 05/19/2012 0935   HGB 12.4 05/19/2012 0935   HCT 37.6 05/19/2012 0935   PLT 269 05/19/2012 0935   MCV 89.7 05/19/2012 0935   MCH 29.6 05/19/2012 0935   MCHC 33.0 05/19/2012 0935   RDW 15.2 05/19/2012 0935   LYMPHSABS 1.2 05/19/2012 0935   MONOABS 0.8 05/19/2012 0935   EOSABS 0.0 05/19/2012 0935   BASOSABS 0.0 05/19/2012 0935    Results for ETHYL, VILA (MRN [id number]) as of 05/26/2012 08:29  Ref. Range 05/19/2012 09:35  Homocysteine Latest Range: 4.0-15.4 umol/L 14.3     RADIOGRAPHIC STUDIES:  05/20/2012  *RADIOLOGY REPORT*  Clinical Data: Wheezing, hypertension, shortness of breath, cough.  CHEST - 2 VIEW  Comparison: 06/26/2011  Findings: The heart is upper limits normal in size. Mild  tortuosity of the thoracic aorta. Lungs are clear,  mildly  hyperinflated. No effusions. No acute bony abnormality.  Postoperative changes in the lower cervical spine.  IMPRESSION:  Mild hyperinflation. No acute cardiopulmonary disease.  Original Report Authenticated By: Charlett Nose, M.D.    ASSESSMENT:  1. DVT of the right leg after trauma in early June 2012 but diagnosed in 10/05/2010 treated initially with Lovenox and then Coumadin and the Coumadin for a total of 6 months of full anticoagulation.  also on hormone replacement therapy the time of the DVT in June 2012 and that has been stopped.  2. Homocystinemia now normal on her B. vitamins and folic acid daily.  3. Phlebitis of the foot in her 47s. 4. COPD secondary to long-standing smoking history.  5. Degenerative joint disease of the neck status post surgery by Dr. Channing Mutters she also has DJD of the lower spine.  6. Hypercholesterolemia  7. Right renal artery occlusion with significant shrinkage of the right kidney  8. History of hypertension   PLAN:  1. I personally reviewed and went over laboratory results with the patient.  2. I personally reviewed and went over radiographic studies with the patient. 3. Lab work in 6 months: CBC diff, Homocysteine level 4. Asked the patient to contact the clinic if she is scheduled for surgical intervention within the next 6 months.  5. Return in 6 months for follow-up.   All questions were answered. The patient knows to call the clinic with any problems, questions or concerns. We can certainly see the patient much sooner if necessary.  The patient and plan discussed with Glenford Peers, MD and he is in agreement with the aforementioned.  KEFALAS,THOMAS

## 2012-06-13 DIAGNOSIS — M47817 Spondylosis without myelopathy or radiculopathy, lumbosacral region: Secondary | ICD-10-CM | POA: Diagnosis not present

## 2012-06-28 ENCOUNTER — Emergency Department (HOSPITAL_COMMUNITY)
Admission: EM | Admit: 2012-06-28 | Discharge: 2012-06-28 | Disposition: A | Discharge status: Home / Self Care | Payer: Medicare Other | Source: Home / Self Care | Attending: Emergency Medicine | Admitting: Emergency Medicine

## 2012-06-28 ENCOUNTER — Encounter (HOSPITAL_COMMUNITY): Payer: Self-pay | Admitting: *Deleted

## 2012-06-28 ENCOUNTER — Emergency Department (HOSPITAL_COMMUNITY): Payer: Medicare Other

## 2012-06-28 DIAGNOSIS — R079 Chest pain, unspecified: Secondary | ICD-10-CM | POA: Diagnosis not present

## 2012-06-28 DIAGNOSIS — F17200 Nicotine dependence, unspecified, uncomplicated: Secondary | ICD-10-CM | POA: Insufficient documentation

## 2012-06-28 DIAGNOSIS — K8020 Calculus of gallbladder without cholecystitis without obstruction: Secondary | ICD-10-CM | POA: Diagnosis present

## 2012-06-28 DIAGNOSIS — M549 Dorsalgia, unspecified: Secondary | ICD-10-CM | POA: Diagnosis present

## 2012-06-28 DIAGNOSIS — Z833 Family history of diabetes mellitus: Secondary | ICD-10-CM

## 2012-06-28 DIAGNOSIS — E860 Dehydration: Secondary | ICD-10-CM | POA: Diagnosis not present

## 2012-06-28 DIAGNOSIS — K7689 Other specified diseases of liver: Secondary | ICD-10-CM | POA: Diagnosis not present

## 2012-06-28 DIAGNOSIS — R932 Abnormal findings on diagnostic imaging of liver and biliary tract: Secondary | ICD-10-CM | POA: Diagnosis not present

## 2012-06-28 DIAGNOSIS — Z8739 Personal history of other diseases of the musculoskeletal system and connective tissue: Secondary | ICD-10-CM | POA: Insufficient documentation

## 2012-06-28 DIAGNOSIS — K5730 Diverticulosis of large intestine without perforation or abscess without bleeding: Secondary | ICD-10-CM | POA: Diagnosis not present

## 2012-06-28 DIAGNOSIS — G8929 Other chronic pain: Secondary | ICD-10-CM | POA: Diagnosis present

## 2012-06-28 DIAGNOSIS — M1710 Unilateral primary osteoarthritis, unspecified knee: Secondary | ICD-10-CM | POA: Diagnosis present

## 2012-06-28 DIAGNOSIS — Z86711 Personal history of pulmonary embolism: Secondary | ICD-10-CM | POA: Diagnosis not present

## 2012-06-28 DIAGNOSIS — R1013 Epigastric pain: Secondary | ICD-10-CM | POA: Diagnosis not present

## 2012-06-28 DIAGNOSIS — K5900 Constipation, unspecified: Secondary | ICD-10-CM | POA: Diagnosis present

## 2012-06-28 DIAGNOSIS — R1115 Cyclical vomiting syndrome unrelated to migraine: Secondary | ICD-10-CM | POA: Diagnosis not present

## 2012-06-28 DIAGNOSIS — Z7982 Long term (current) use of aspirin: Secondary | ICD-10-CM

## 2012-06-28 DIAGNOSIS — Z87448 Personal history of other diseases of urinary system: Secondary | ICD-10-CM | POA: Insufficient documentation

## 2012-06-28 DIAGNOSIS — I10 Essential (primary) hypertension: Secondary | ICD-10-CM | POA: Insufficient documentation

## 2012-06-28 DIAGNOSIS — N281 Cyst of kidney, acquired: Secondary | ICD-10-CM | POA: Diagnosis not present

## 2012-06-28 DIAGNOSIS — J984 Other disorders of lung: Secondary | ICD-10-CM | POA: Diagnosis not present

## 2012-06-28 DIAGNOSIS — R195 Other fecal abnormalities: Secondary | ICD-10-CM | POA: Diagnosis not present

## 2012-06-28 DIAGNOSIS — R112 Nausea with vomiting, unspecified: Secondary | ICD-10-CM | POA: Diagnosis not present

## 2012-06-28 DIAGNOSIS — R10811 Right upper quadrant abdominal tenderness: Secondary | ICD-10-CM | POA: Diagnosis not present

## 2012-06-28 DIAGNOSIS — E1169 Type 2 diabetes mellitus with other specified complication: Secondary | ICD-10-CM | POA: Diagnosis present

## 2012-06-28 DIAGNOSIS — E119 Type 2 diabetes mellitus without complications: Secondary | ICD-10-CM | POA: Diagnosis not present

## 2012-06-28 DIAGNOSIS — K219 Gastro-esophageal reflux disease without esophagitis: Secondary | ICD-10-CM | POA: Insufficient documentation

## 2012-06-28 DIAGNOSIS — N289 Disorder of kidney and ureter, unspecified: Secondary | ICD-10-CM | POA: Diagnosis present

## 2012-06-28 DIAGNOSIS — Z79899 Other long term (current) drug therapy: Secondary | ICD-10-CM

## 2012-06-28 DIAGNOSIS — Z823 Family history of stroke: Secondary | ICD-10-CM | POA: Diagnosis not present

## 2012-06-28 DIAGNOSIS — Z8639 Personal history of other endocrine, nutritional and metabolic disease: Secondary | ICD-10-CM | POA: Insufficient documentation

## 2012-06-28 DIAGNOSIS — Z809 Family history of malignant neoplasm, unspecified: Secondary | ICD-10-CM

## 2012-06-28 DIAGNOSIS — Z862 Personal history of diseases of the blood and blood-forming organs and certain disorders involving the immune mechanism: Secondary | ICD-10-CM | POA: Insufficient documentation

## 2012-06-28 DIAGNOSIS — R109 Unspecified abdominal pain: Secondary | ICD-10-CM

## 2012-06-28 DIAGNOSIS — Z8679 Personal history of other diseases of the circulatory system: Secondary | ICD-10-CM | POA: Insufficient documentation

## 2012-06-28 DIAGNOSIS — J9819 Other pulmonary collapse: Secondary | ICD-10-CM | POA: Diagnosis not present

## 2012-06-28 DIAGNOSIS — R110 Nausea: Secondary | ICD-10-CM | POA: Insufficient documentation

## 2012-06-28 DIAGNOSIS — J029 Acute pharyngitis, unspecified: Secondary | ICD-10-CM | POA: Insufficient documentation

## 2012-06-28 LAB — CBC WITH DIFFERENTIAL/PLATELET
Basophils Absolute: 0 10*3/uL (ref 0.0–0.1)
Basophils Relative: 0 % (ref 0–1)
Eosinophils Absolute: 0 10*3/uL (ref 0.0–0.7)
Hemoglobin: 12.9 g/dL (ref 12.0–15.0)
MCH: 29.4 pg (ref 26.0–34.0)
MCHC: 33.2 g/dL (ref 30.0–36.0)
Neutro Abs: 6.4 10*3/uL (ref 1.7–7.7)
Neutrophils Relative %: 80 % — ABNORMAL HIGH (ref 43–77)
Platelets: 254 10*3/uL (ref 150–400)
RDW: 15.3 % (ref 11.5–15.5)

## 2012-06-28 LAB — COMPREHENSIVE METABOLIC PANEL
AST: 15 U/L (ref 0–37)
Albumin: 4 g/dL (ref 3.5–5.2)
Alkaline Phosphatase: 106 U/L (ref 39–117)
BUN: 12 mg/dL (ref 6–23)
Chloride: 98 mEq/L (ref 96–112)
Potassium: 3.3 mEq/L — ABNORMAL LOW (ref 3.5–5.1)
Sodium: 137 mEq/L (ref 135–145)
Total Bilirubin: 0.3 mg/dL (ref 0.3–1.2)
Total Protein: 7.7 g/dL (ref 6.0–8.3)

## 2012-06-28 LAB — TROPONIN I: Troponin I: <0.3 ng/mL (ref <0.30)

## 2012-06-28 LAB — GLUCOSE, CAPILLARY: Glucose-Capillary: 219 mg/dL — ABNORMAL HIGH (ref 70–99)

## 2012-06-28 MED ORDER — SODIUM CHLORIDE 0.9 % IV SOLN
Freq: Once | INTRAVENOUS | Status: AC
  Administered 2012-06-28: 1000 mL via INTRAVENOUS

## 2012-06-28 MED ORDER — GI COCKTAIL ~~LOC~~
30.0000 mL | Freq: Once | ORAL | Status: AC
  Administered 2012-06-28: 30 mL via ORAL
  Filled 2012-06-28: qty 30

## 2012-06-28 MED ORDER — MORPHINE SULFATE 4 MG/ML IJ SOLN
2.0000 mg | Freq: Once | INTRAMUSCULAR | Status: AC
Start: 2012-06-28 — End: 2012-06-28
  Administered 2012-06-28: 2 mg via INTRAVENOUS
  Filled 2012-06-28: qty 1

## 2012-06-28 MED ORDER — ONDANSETRON HCL 4 MG/2ML IJ SOLN
4.0000 mg | Freq: Once | INTRAMUSCULAR | Status: AC
  Administered 2012-06-28: 4 mg via INTRAVENOUS
  Filled 2012-06-28: qty 2

## 2012-06-28 MED ORDER — MORPHINE SULFATE 4 MG/ML IJ SOLN
2.0000 mg | Freq: Once | INTRAMUSCULAR | Status: AC
  Administered 2012-06-28: 2 mg via INTRAVENOUS
  Filled 2012-06-28: qty 1

## 2012-06-28 MED ORDER — ONDANSETRON 4 MG PO TBDP: 4.0000 mg | ORAL_TABLET | Freq: Three times a day (TID) | ORAL | Status: DC | PRN

## 2012-06-28 MED ORDER — SODIUM CHLORIDE 0.9 % IV BOLUS (SEPSIS)
1000.0000 mL | Freq: Once | INTRAVENOUS | Status: AC
  Administered 2012-06-28: 1000 mL via INTRAVENOUS

## 2012-06-28 MED ORDER — PANTOPRAZOLE SODIUM 40 MG IV SOLR
40.0000 mg | Freq: Once | INTRAVENOUS | Status: AC
  Administered 2012-06-28: 40 mg via INTRAVENOUS
  Filled 2012-06-28: qty 40

## 2012-06-28 NOTE — ED Notes (Signed)
Pt states she has lower abdominal pain, chest pain, and throat pain started this am.

## 2012-06-28 NOTE — ED Provider Notes (Signed)
History     CSN: [id number]  Arrival date & time 06/28/12  0413   First MD Initiated Contact with Patient 06/28/12 [phone]      Chief Complaint  Patient presents with  . Chest Pain  . Abdominal Pain  . Sore Throat    (Consider location/radiation/quality/duration/timing/severity/associated sxs/prior treatment) HPI [name] is a 67 y.o. female who presents to the Emergency Department complaining of abdominal pain that has been present since yesterday that now has spread up to her chest and throat. She has a h/o DM, HTN, GERD. She has taken dixilant, tums, pepto bismol without relief of the pain.  PCP Dr Phillips Odor  Past Medical History  Diagnosis Date  . Hypertension   . Diabetes mellitus     non insulin  . Arthritis   . History of blood clots   . Homocysteinemia 11/29/2010  . History of gout     right ring finger  . Left knee pain   . Kidney atrophy     rt.    Past Surgical History  Procedure Laterality Date  . Abdominal hysterectomy    . Knee surgery      rt. arthroscopic  . Tonsillectomy    . Hypertension    . Type ll diabetes    . Neck surgery      Family History  Problem Relation Age of Onset  . Stroke Mother   . Cancer Father   . Diabetes Maternal Aunt   . Diabetes Maternal Uncle   . Diabetes Maternal Grandmother     History  Substance Use Topics  . Smoking status: Current Every Day Smoker -- 1.00 packs/day for 25 years    Types: Cigarettes  . Smokeless tobacco: Never Used  . Alcohol Use: Yes     Comment: occasionally    OB History   Grav Para Term Preterm Abortions TAB SAB Ect Mult Living                  Review of Systems  Constitutional: Negative for fever.       10 Systems reviewed and are negative for acute change except as noted in the HPI.  HENT: Negative for congestion.   Eyes: Negative for discharge and redness.  Respiratory: Negative for cough and shortness of breath.   Cardiovascular: Negative for chest pain.   Gastrointestinal: Positive for nausea and abdominal pain. Negative for vomiting.  Musculoskeletal: Negative for back pain.  Skin: Negative for rash.  Neurological: Negative for syncope, numbness and headaches.  Psychiatric/Behavioral:       No behavior change.    Allergies  Ace inhibitors; Celecoxib; and Hydrocodone-acetaminophen  Home Medications   Current Outpatient Rx  Name  Route  Sig  Dispense  Refill  . acetaminophen (TYLENOL) 500 MG tablet   Oral   Take 500 mg by mouth every 6 (six) hours as needed for pain. Takes 2 as needed for discomfort         . Ascorbic Acid (VITAMIN C) 1000 MG tablet   Oral   Take 1,000 mg by mouth daily.         Marland Kitchen aspirin 81 MG tablet   Oral   Take 81 mg by mouth every evening.          Marland Kitchen b complex vitamins tablet   Oral   Take 1 tablet by mouth daily. Takes 2 in the morning.         . Cholecalciferol (VITAMIN D3) 1000 UNITS CAPS  Oral   Take 1 tablet by mouth daily.           Marland Kitchen dexlansoprazole (DEXILANT) 60 MG capsule   Oral   Take 60 mg by mouth every evening.          . fish oil-omega-3 fatty acids 1000 MG capsule   Oral   Take 1 g by mouth.         . Flaxseed, Linseed, 1000 MG CAPS   Oral   Take 1 capsule by mouth daily.         . folic acid (FOLVITE) 1 MG tablet   Oral   Take 1 tablet (1 mg total) by mouth daily.   60 tablet   3   . glimepiride (AMARYL) 2 MG tablet   Oral   Take 2 mg by mouth 2 (two) times daily.         Marland Kitchen lidocaine (LIDODERM) 5 %   Transdermal   Place 1 patch onto the skin daily. Remove & Discard patch within 12 hours or as directed by MD         . linagliptin (TRADJENTA) 5 MG TABS tablet   Oral   Take 5 mg by mouth daily.           Marland Kitchen NIFEdipine (PROCARDIA XL/ADALAT-CC) 60 MG 24 hr tablet   Oral   Take 60 mg by mouth daily.           . pioglitazone (ACTOS) 30 MG tablet   Oral   Take 30 mg by mouth daily.           Marland Kitchen PROVENTIL HFA 108 (90 BASE) MCG/ACT inhaler    Inhalation   Inhale 1 puff into the lungs daily as needed. For shortness of breath/wheezing         . simvastatin (ZOCOR) 20 MG tablet   Oral   Take 20 mg by mouth At bedtime.         Marland Kitchen zolpidem (AMBIEN) 10 MG tablet   Oral   Take 10 mg by mouth at bedtime as needed for sleep.           BP 157/86  Pulse 95  Temp(Src) 98.7 F (37.1 C)  Resp 22  Ht 5\' 10"  (1.778 m)  Wt 225 lb (102.059 kg)  BMI 32.28 kg/m2  SpO2 100%  Physical Exam  Nursing note and vitals reviewed. Constitutional: She appears well-developed and well-nourished.  Awake, alert, nontoxic appearance.  HENT:  Head: Normocephalic and atraumatic.  Right Ear: External ear normal.  Left Ear: External ear normal.  Mouth/Throat: Oropharynx is clear and moist.  Eyes: EOM are normal. Pupils are equal, round, and reactive to light. Right eye exhibits no discharge. Left eye exhibits no discharge.  Neck: Normal range of motion. Neck supple.  Cardiovascular: Normal rate and intact distal pulses.   Pulmonary/Chest: Effort normal and breath sounds normal. She exhibits no tenderness.  Abdominal: Soft. Bowel sounds are normal. There is no tenderness. There is no rebound.  No tenderness with palpation.  Musculoskeletal: She exhibits no tenderness.  Baseline ROM, no obvious new focal weakness.  Neurological:  Mental status and motor strength appears baseline for patient and situation.  Skin: No rash noted.  Psychiatric: She has a normal mood and affect.    ED Course  Procedures (including critical care time) Results for orders placed during the hospital encounter of 06/28/12  GLUCOSE, CAPILLARY      Result Value Range   Glucose-Capillary 219 (*)  70 - 99 mg/dL  CBC WITH DIFFERENTIAL      Result Value Range   WBC 8.1  4.0 - 10.5 K/uL   RBC 4.39  3.87 - 5.11 MIL/uL   Hemoglobin 12.9  12.0 - 15.0 g/dL   HCT 16.1  09.6 - 04.5 %   MCV 88.4  78.0 - 100.0 fL   MCH 29.4  26.0 - 34.0 pg   MCHC 33.2  30.0 - 36.0 g/dL    RDW 40.9  81.1 - 91.4 %   Platelets 254  150 - 400 K/uL   Neutrophils Relative 80 (*) 43 - 77 %   Neutro Abs 6.4  1.7 - 7.7 K/uL   Lymphocytes Relative 16  12 - 46 %   Lymphs Abs 1.3  0.7 - 4.0 K/uL   Monocytes Relative 5  3 - 12 %   Monocytes Absolute 0.4  0.1 - 1.0 K/uL   Eosinophils Relative 0  0 - 5 %   Eosinophils Absolute 0.0  0.0 - 0.7 K/uL   Basophils Relative 0  0 - 1 %   Basophils Absolute 0.0  0.0 - 0.1 K/uL  COMPREHENSIVE METABOLIC PANEL      Result Value Range   Sodium 137  135 - 145 mEq/L   Potassium 3.3 (*) 3.5 - 5.1 mEq/L   Chloride 98  96 - 112 mEq/L   CO2 25  19 - 32 mEq/L   Glucose, Bld 238 (*) 70 - 99 mg/dL   BUN 12  6 - 23 mg/dL   Creatinine, Ser 7.82  0.50 - 1.10 mg/dL   Calcium 95.6  8.4 - 21.3 mg/dL   Total Protein 7.7  6.0 - 8.3 g/dL   Albumin 4.0  3.5 - 5.2 g/dL   AST 15  0 - 37 U/L   ALT 12  0 - 35 U/L   Alkaline Phosphatase 106  39 - 117 U/L   Total Bilirubin 0.3  0.3 - 1.2 mg/dL   GFR calc non Af Amer 61 (*) >90 mL/min   GFR calc Af Amer 71 (*) >90 mL/min  TROPONIN I      Result Value Range   Troponin I <0.30  <0.30 ng/mL    Date: 003/29/2014  0422 Pulse: 95  Rhythm: normal sinus rhythm  QRS Axis: normal  Intervals: normal  ST/T Wave abnormalities: normal  Conduction Disutrbances:none  Narrative Interpretation:   Old EKG Reviewed: none available  Dg Abd Acute W/chest  06/28/2012  *RADIOLOGY REPORT*  Clinical Data: Lower abdominal pain and chest pain.  ACUTE ABDOMEN SERIES (ABDOMEN 2 VIEW & CHEST 1 VIEW)  Comparison: Chest x-ray 05/20/2012.  Findings: Lung volumes are slightly low.  There are some bibasilar opacities which may reflect areas of atelectasis and/or consolidation.  No definite pleural effusions.  No evidence of pulmonary edema.  Heart size is upper limits of normal.  Upper mediastinal contours are unremarkable.  Orthopedic fixation hardware in the lower cervical spine incompletely imaged.  Gas and stool are seen scattered  throughout the colon extending to the level of the distal rectum.  No pathologic distension of small bowel is noted.  No gross evidence of pneumoperitoneum.  Radiopaque objects projecting over the lower pelvis are presumably exterior to the patient.  IMPRESSION: 1.  Nonobstructive bowel gas pattern. 2.  No pneumoperitoneum. 3.  Low lung volumes with bibasilar atelectasis and/or consolidation.   Original Report Authenticated By: Trudie Reed, M.D.      MDM  Patient with  GI pain to lower abdomen that radiates up to her chest and throat. Given protonix and GI cocktail. Given morphine which helped with the pain. Labs are unremarkable. Xrays show a lot of stool and gas in the colon. Reviewed results with the patient.Pt stable in ED with no significant deterioration in condition.The patient appears reasonably screened and/or stabilized for discharge and I doubt any other medical condition or other [facility] requiring further screening, evaluation, or treatment in the ED at this time prior to discharge.  MDM Reviewed: nursing note and vitals Interpretation: labs and x-ray           Nicoletta Dress. Colon Branch, MD 06/28/12 [phone]

## 2012-06-29 ENCOUNTER — Emergency Department (HOSPITAL_COMMUNITY): Payer: Medicare Other

## 2012-06-29 ENCOUNTER — Encounter (HOSPITAL_COMMUNITY): Payer: Self-pay | Admitting: Emergency Medicine

## 2012-06-29 ENCOUNTER — Emergency Department (HOSPITAL_COMMUNITY)
Admission: EM | Admit: 2012-06-29 | Discharge: 2012-06-29 | Disposition: A | Discharge status: Home / Self Care | Payer: Medicare Other | Source: Home / Self Care | Attending: Emergency Medicine | Admitting: Emergency Medicine

## 2012-06-29 DIAGNOSIS — I10 Essential (primary) hypertension: Secondary | ICD-10-CM | POA: Insufficient documentation

## 2012-06-29 DIAGNOSIS — R1110 Vomiting, unspecified: Secondary | ICD-10-CM | POA: Insufficient documentation

## 2012-06-29 DIAGNOSIS — N2889 Other specified disorders of kidney and ureter: Secondary | ICD-10-CM

## 2012-06-29 DIAGNOSIS — R109 Unspecified abdominal pain: Secondary | ICD-10-CM | POA: Insufficient documentation

## 2012-06-29 DIAGNOSIS — Z79899 Other long term (current) drug therapy: Secondary | ICD-10-CM | POA: Insufficient documentation

## 2012-06-29 DIAGNOSIS — N289 Disorder of kidney and ureter, unspecified: Secondary | ICD-10-CM | POA: Insufficient documentation

## 2012-06-29 DIAGNOSIS — Z8639 Personal history of other endocrine, nutritional and metabolic disease: Secondary | ICD-10-CM | POA: Insufficient documentation

## 2012-06-29 DIAGNOSIS — Z862 Personal history of diseases of the blood and blood-forming organs and certain disorders involving the immune mechanism: Secondary | ICD-10-CM | POA: Insufficient documentation

## 2012-06-29 DIAGNOSIS — Z87448 Personal history of other diseases of urinary system: Secondary | ICD-10-CM | POA: Insufficient documentation

## 2012-06-29 DIAGNOSIS — E119 Type 2 diabetes mellitus without complications: Secondary | ICD-10-CM | POA: Insufficient documentation

## 2012-06-29 DIAGNOSIS — M129 Arthropathy, unspecified: Secondary | ICD-10-CM | POA: Insufficient documentation

## 2012-06-29 DIAGNOSIS — Z90710 Acquired absence of both cervix and uterus: Secondary | ICD-10-CM | POA: Insufficient documentation

## 2012-06-29 DIAGNOSIS — Z86718 Personal history of other venous thrombosis and embolism: Secondary | ICD-10-CM | POA: Insufficient documentation

## 2012-06-29 DIAGNOSIS — Z7982 Long term (current) use of aspirin: Secondary | ICD-10-CM | POA: Insufficient documentation

## 2012-06-29 DIAGNOSIS — F17200 Nicotine dependence, unspecified, uncomplicated: Secondary | ICD-10-CM | POA: Insufficient documentation

## 2012-06-29 LAB — URINALYSIS, ROUTINE W REFLEX MICROSCOPIC
Bilirubin Urine: NEGATIVE
Glucose, UA: NEGATIVE mg/dL
Hgb urine dipstick: NEGATIVE
Ketones, ur: NEGATIVE mg/dL
Leukocytes, UA: NEGATIVE
Nitrite: NEGATIVE
Protein, ur: 30 mg/dL — AB
Specific Gravity, Urine: 1.015 (ref 1.005–1.030)
Urobilinogen, UA: 0.2 mg/dL (ref 0.0–1.0)
pH: 6.5 (ref 5.0–8.0)

## 2012-06-29 LAB — GLUCOSE, CAPILLARY: Glucose-Capillary: 173 mg/dL — ABNORMAL HIGH (ref 70–99)

## 2012-06-29 LAB — LIPASE, BLOOD: Lipase: 23 U/L (ref 11–59)

## 2012-06-29 LAB — URINE MICROSCOPIC-ADD ON

## 2012-06-29 LAB — TROPONIN I: Troponin I: <0.3 ng/mL (ref <0.30)

## 2012-06-29 MED ORDER — ONDANSETRON HCL 4 MG/2ML IJ SOLN
4.0000 mg | Freq: Once | INTRAMUSCULAR | Status: AC
  Administered 2012-06-29: 4 mg via INTRAVENOUS
  Filled 2012-06-29: qty 2

## 2012-06-29 MED ORDER — OXYCODONE-ACETAMINOPHEN 5-325 MG PO TABS: 1.0000 | ORAL_TABLET | ORAL | Status: DC | PRN

## 2012-06-29 MED ORDER — SUCRALFATE 1 G PO TABS
1.0000 g | ORAL_TABLET | Freq: Three times a day (TID) | ORAL | Status: DC
  Filled 2012-06-29 (×6): qty 1

## 2012-06-29 MED ORDER — IOHEXOL 300 MG/ML  SOLN
100.0000 mL | Freq: Once | INTRAMUSCULAR | Status: AC | PRN
  Administered 2012-06-29: 100 mL via INTRAVENOUS

## 2012-06-29 MED ORDER — SUCRALFATE 1 G PO TABS
ORAL_TABLET | ORAL | Status: AC
  Filled 2012-06-29: qty 1

## 2012-06-29 MED ORDER — SODIUM CHLORIDE 0.9 % IV BOLUS (SEPSIS)
1000.0000 mL | Freq: Once | INTRAVENOUS | Status: AC
  Administered 2012-06-29: 1000 mL via INTRAVENOUS

## 2012-06-29 MED ORDER — ONDANSETRON HCL 4 MG/2ML IJ SOLN: 4.0000 mg | Freq: Once | INTRAMUSCULAR | Status: DC

## 2012-06-29 MED ORDER — PROMETHAZINE HCL 25 MG PO TABS: 12.5000 mg | ORAL_TABLET | Freq: Three times a day (TID) | ORAL | Status: DC | PRN

## 2012-06-29 MED ORDER — HYDROMORPHONE HCL PF 1 MG/ML IJ SOLN
1.0000 mg | Freq: Once | INTRAMUSCULAR | Status: AC
  Administered 2012-06-29: 1 mg via INTRAVENOUS
  Filled 2012-06-29: qty 1

## 2012-06-29 MED ORDER — PANTOPRAZOLE SODIUM 40 MG IV SOLR
40.0000 mg | Freq: Once | INTRAVENOUS | Status: AC
  Administered 2012-06-29: 40 mg via INTRAVENOUS
  Filled 2012-06-29: qty 40

## 2012-06-29 MED ORDER — IOHEXOL 300 MG/ML  SOLN
50.0000 mL | Freq: Once | INTRAMUSCULAR | Status: AC | PRN
  Administered 2012-06-29: 50 mL via ORAL

## 2012-06-29 MED ORDER — SUCRALFATE 1 G PO TABS: 1.0000 g | ORAL_TABLET | Freq: Four times a day (QID) | ORAL | Status: DC

## 2012-06-29 MED ORDER — HYDROMORPHONE HCL PF 1 MG/ML IJ SOLN
0.5000 mg | Freq: Once | INTRAMUSCULAR | Status: AC
  Administered 2012-06-29: 0.5 mg via INTRAVENOUS
  Filled 2012-06-29: qty 1

## 2012-06-29 NOTE — ED Notes (Signed)
Pt c/o abd up into esophagus. C/o nausea. Pt vomited when brushed teeth. Denies dirrhea. No bm x 3 days. Took otc meds /laxatives/prune juice and enemas with no bm with no relief.

## 2012-06-29 NOTE — ED Provider Notes (Signed)
History     This chart was scribed for Raeford Razor, MD, MD by Smitty Pluck, ED Scribe. The patient was seen in room APA19/APA19 and the patient's care was started at 5:49 PM.   CSN: [id number]  Arrival date & time 06/29/12  1735        Chief Complaint  Patient presents with  . Abdominal Pain  . Emesis    The history is provided by the patient and the spouse. No language interpreter was used.   [name] is a 67 y.o. female with hx of GERD, DM,  who presents to the Emergency Department complaining of waxing and waning, moderate general abdominal pain onset 2 days ago. Epigastric region and radiates up chest to almost her neck.  She was seen in ED 1 day ago for same symptoms and given protonix and GI cocktail.  Pt reports that she has nausea and vomited when she brushed her teeth. She states she is constipated and her last BM was 3 days ago. She has taken enema and prune juice without a bowel movement. She states her last stool was black. Pt denies fever, chills, diarrhea, weakness, cough, SOB and any other pain. Pt has hx of hysterectomy. Pt smokes cigarettes.    Past Medical History  Diagnosis Date  . Hypertension   . Diabetes mellitus     non insulin  . Arthritis   . History of blood clots   . Homocysteinemia 11/29/2010  . History of gout     right ring finger  . Left knee pain   . Kidney atrophy     rt.    Past Surgical History  Procedure Laterality Date  . Abdominal hysterectomy    . Knee surgery      rt. arthroscopic  . Tonsillectomy    . Hypertension    . Type ll diabetes    . Neck surgery      Family History  Problem Relation Age of Onset  . Stroke Mother   . Cancer Father   . Diabetes Maternal Aunt   . Diabetes Maternal Uncle   . Diabetes Maternal Grandmother     History  Substance Use Topics  . Smoking status: Current Every Day Smoker -- 1.00 packs/day for 25 years    Types: Cigarettes  . Smokeless tobacco: Never Used  . Alcohol Use: Yes      Comment: occasionally    OB History   Grav Para Term Preterm Abortions TAB SAB Ect Mult Living                  Review of Systems  All other systems reviewed and are negative.    Allergies  Ace inhibitors; Celecoxib; and Hydrocodone-acetaminophen  Home Medications   Current Outpatient Rx  Name  Route  Sig  Dispense  Refill  . acetaminophen (TYLENOL) 500 MG tablet   Oral   Take 500 mg by mouth every 6 (six) hours as needed for pain. Takes 2 as needed for discomfort         . Ascorbic Acid (VITAMIN C) 1000 MG tablet   Oral   Take 1,000 mg by mouth daily.         Marland Kitchen aspirin 81 MG tablet   Oral   Take 81 mg by mouth every evening.          Marland Kitchen b complex vitamins tablet   Oral   Take 1 tablet by mouth daily. Takes 2 in the morning.         Marland Kitchen  Cholecalciferol (VITAMIN D3) 1000 UNITS CAPS   Oral   Take 1 tablet by mouth daily.           Marland Kitchen dexlansoprazole (DEXILANT) 60 MG capsule   Oral   Take 60 mg by mouth every evening.          . fish oil-omega-3 fatty acids 1000 MG capsule   Oral   Take 1 g by mouth.         . Flaxseed, Linseed, 1000 MG CAPS   Oral   Take 1 capsule by mouth daily.         . folic acid (FOLVITE) 1 MG tablet   Oral   Take 1 tablet (1 mg total) by mouth daily.   60 tablet   3   . glimepiride (AMARYL) 2 MG tablet   Oral   Take 2 mg by mouth 2 (two) times daily.         Marland Kitchen lidocaine (LIDODERM) 5 %   Transdermal   Place 1 patch onto the skin daily. Remove & Discard patch within 12 hours or as directed by MD         . linagliptin (TRADJENTA) 5 MG TABS tablet   Oral   Take 5 mg by mouth daily.           Marland Kitchen NIFEdipine (PROCARDIA XL/ADALAT-CC) 60 MG 24 hr tablet   Oral   Take 60 mg by mouth daily.           . ondansetron (ZOFRAN ODT) 4 MG disintegrating tablet   Oral   Take 1 tablet (4 mg total) by mouth every 8 (eight) hours as needed for nausea.   20 tablet   0   . pioglitazone (ACTOS) 30 MG tablet   Oral    Take 30 mg by mouth daily.           Marland Kitchen PROVENTIL HFA 108 (90 BASE) MCG/ACT inhaler   Inhalation   Inhale 1 puff into the lungs daily as needed. For shortness of breath/wheezing         . simvastatin (ZOCOR) 20 MG tablet   Oral   Take 20 mg by mouth At bedtime.         Marland Kitchen zolpidem (AMBIEN) 10 MG tablet   Oral   Take 10 mg by mouth at bedtime as needed for sleep.           BP 138/77  Pulse 93  Temp(Src) 97.9 F (36.6 C)  Resp 16  Ht 5\' 10"  (1.778 m)  Wt 225 lb (102.059 kg)  BMI 32.28 kg/m2  SpO2 100%  Physical Exam  Nursing note and vitals reviewed. Constitutional: She appears well-developed and well-nourished. No distress.  Uncomfortable appearing but nontoxic   HENT:  Head: Normocephalic and atraumatic.  Eyes: Conjunctivae are normal. Right eye exhibits no discharge. Left eye exhibits no discharge.  Neck: Neck supple.  Cardiovascular: Normal rate, regular rhythm and normal heart sounds.  Exam reveals no gallop and no friction rub.   No murmur heard. Pulmonary/Chest: Effort normal. No respiratory distress. She has wheezes (mild on left side).  Abdominal: Soft. She exhibits no distension. There is tenderness (mild diffuse). There is no rebound and no guarding.  Musculoskeletal: She exhibits no edema and no tenderness.  Neurological: She is alert.  Skin: Skin is warm and dry.  Psychiatric: She has a normal mood and affect. Her behavior is normal. Thought content normal.    ED Course  Procedures (including critical  care time) DIAGNOSTIC STUDIES: Oxygen Saturation is 100% on room air, normal by my interpretation.    COORDINATION OF CARE: 5:53 PM Discussed ED treatment with pt and pt agrees.  6:02 PM Ordered:  Medications  ondansetron (ZOFRAN) injection 4 mg (4 mg Intravenous Given 06/29/12 1801)  HYDROmorphone (DILAUDID) injection 1 mg (1 mg Intravenous Given 06/29/12 1808)  sodium chloride 0.9 % bolus 1,000 mL (0 mLs Intravenous Stopped 06/29/12 1918)   pantoprazole (PROTONIX) injection 40 mg (40 mg Intravenous Given 06/29/12 1808)  iohexol (OMNIPAQUE) 300 MG/ML solution 50 mL (50 mLs Oral Contrast Given 06/29/12 1945)  iohexol (OMNIPAQUE) 300 MG/ML solution 100 mL (100 mLs Intravenous Contrast Given 06/29/12 1946)  ondansetron (ZOFRAN) injection 4 mg (4 mg Intravenous Given 06/29/12 2020)  HYDROmorphone (DILAUDID) injection 0.5 mg (0.5 mg Intravenous Given 06/29/12 2031)    Pt's recent ED record and testing reviewed.    Labs Reviewed  GLUCOSE, CAPILLARY - Abnormal; Notable for the following:    Glucose-Capillary 173 (*)    All other components within normal limits  URINALYSIS, ROUTINE W REFLEX MICROSCOPIC - Abnormal; Notable for the following:    Protein, ur 30 (*)    All other components within normal limits  TROPONIN I  LIPASE, BLOOD  URINE MICROSCOPIC-ADD ON   Dg Chest 2 View  06/29/2012  *RADIOLOGY REPORT*  Clinical Data: Chest pain  CHEST - 2 VIEW  Comparison: 06/28/2012  Findings: Mild cardiomegaly. Mild patchy density at the posterior left lung base.  No pneumothorax or pleural effusion.  Increased AP diameter chest and bronchitic changes are noted.  Stable thoracic spine.  IMPRESSION: Cardiomegaly without decompensation.  Mild patchy atelectasis verses airspace disease at the posterior left lung base.  Changes related to COPD.   Original Report Authenticated By: Jolaine Click, M.D.    Ct Abdomen Pelvis W Contrast  06/29/2012  *RADIOLOGY REPORT*  Clinical Data: Lower abdominal pain and emesis.  CT ABDOMEN AND PELVIS WITH CONTRAST  Technique:  Multidetector CT imaging of the abdomen and pelvis was performed following the standard protocol during bolus administration of intravenous contrast.  Contrast: 50mL OMNIPAQUE IOHEXOL 300 MG/ML  SOLN, OMNIPAQUE IOHEXOL 300 MG/ML  SOLN  Comparison: 11/17/2010  Findings: Diffuse hepatic steatosis.  Small gallstones are suspected, best viewed on sagittal reconstruction imaging.  Lesion in the  lower pole of the left kidney is less distinct and smaller on image 37.  On image 29, the 2.5 cm enhancing lesion in the right kidney has enlarged worrisome for renal cell carcinoma.  Other hypodensities in both kidneys are stable.  Severe atrophy of the right kidney is again noted.  There is extensive stranding surrounding the right kidney.  No definite ureteral calculus.  Stable left adrenal nodule.  Right adrenal gland and pancreas are within normal limits.  Normal appendix.  Sigmoid diverticulosis without evidence of acute diverticulitis.  Advanced facet arthropathy with spinal stenosis at in the lower lumbar spine.  No vertebral compression deformity.  No evidence of free fluid.  Unremarkable bladder.  IMPRESSION: Inflammatory changes of the right kidney are suspected.  Cholelithiasis is suspected and cannot be confirmed with sonography.  Lesion in the right kidney is larger worrisome for renal cell carcinoma.  This was incompletely characterized on a previous MRI. Repeat MR may be helpful when the patient can hold her breath.   Original Report Authenticated By: Jolaine Click, M.D.    Dg Abd Acute W/chest  06/28/2012  *RADIOLOGY REPORT*  Clinical Data: Lower abdominal pain and  chest pain.  ACUTE ABDOMEN SERIES (ABDOMEN 2 VIEW & CHEST 1 VIEW)  Comparison: Chest x-ray 05/20/2012.  Findings: Lung volumes are slightly low.  There are some bibasilar opacities which may reflect areas of atelectasis and/or consolidation.  No definite pleural effusions.  No evidence of pulmonary edema.  Heart size is upper limits of normal.  Upper mediastinal contours are unremarkable.  Orthopedic fixation hardware in the lower cervical spine incompletely imaged.  Gas and stool are seen scattered throughout the colon extending to the level of the distal rectum.  No pathologic distension of small bowel is noted.  No gross evidence of pneumoperitoneum.  Radiopaque objects projecting over the lower pelvis are presumably exterior to the  patient.  IMPRESSION: 1.  Nonobstructive bowel gas pattern. 2.  No pneumoperitoneum. 3.  Low lung volumes with bibasilar atelectasis and/or consolidation.   Original Report Authenticated By: Trudie Reed, M.D.      1. Abdominal pain   2. Right kidney mass       MDM  66yF with abdominal pain radiating up into chest. Suspect GI etiology such as GERD. Unfortunately CT with findings concerning for possible renal cell carcinoma. I do not think that this is etiology of her current symptoms. Possible cholelithiasis noted as well, but I do not think she has cholecystitis or necessarily symptomatic cholelithiasis. Afebrile. Well appearing. Pain almost completely resolved prior to DC. No leukocytosis. LFTs although nonspecific, are normal. Fairly minimal tenderness on exam and doesn't seem to localize. Plan to treat symptomatically at this time. Pt has good PCP follow-up. Already established with Dr. Mariel Sleet, Heme/Onc, and Dr Patsi Sears, urology as well. Understands need for follow-up for further evaluation of renal mass.  Emergent return precautions discussed.   I personally preformed the services scribed in my presence. The recorded information has been reviewed is accurate. Raeford Razor, MD.    Raeford Razor, MD 06/29/12 [phone]

## 2012-06-29 NOTE — ED Notes (Signed)
Dr Juleen China in room speaking with patient at this time

## 2012-06-29 NOTE — ED Notes (Signed)
Pt states pain in under control at this time, pt is resting in bed comfortably, states mild nausea. Is currently drinking her contrast at this time. Informed pt that if her nausea becomes uncontrolled to hit her callbell and I would ask the EDP for additional orders.

## 2012-06-29 NOTE — ED Notes (Signed)
Pt ambulatory to restroom, tolerated well.  

## 2012-06-29 NOTE — ED Notes (Signed)
Dr Juleen China has spent time speaking with patient, at discharge pt has no questions or additional concerns.

## 2012-06-29 NOTE — ED Notes (Signed)
Pt returns to er today with c/o abd pain that radiates from abd area to throat area, pt started having pain that comes and goes Friday, pain is associated with n/v, constipation, pt states that her last bowel movement that was normal was Friday, had a small hard bowel movement that was black in color today after taking multiple episodes of otc laxatives. Dr. Juleen China at bedside.

## 2012-06-29 NOTE — ED Notes (Signed)
Pt ambulated to bathroom 

## 2012-06-30 ENCOUNTER — Emergency Department (HOSPITAL_COMMUNITY): Payer: Medicare Other

## 2012-06-30 ENCOUNTER — Encounter (HOSPITAL_COMMUNITY): Payer: Self-pay | Admitting: *Deleted

## 2012-06-30 ENCOUNTER — Inpatient Hospital Stay (HOSPITAL_COMMUNITY)
Admission: EM | Admit: 2012-06-30 | Discharge: 2012-07-04 | DRG: 392 | Disposition: A | Discharge status: Home / Self Care | Payer: Medicare Other | Attending: Family Medicine | Admitting: Family Medicine

## 2012-06-30 DIAGNOSIS — G8929 Other chronic pain: Secondary | ICD-10-CM

## 2012-06-30 DIAGNOSIS — N2889 Other specified disorders of kidney and ureter: Secondary | ICD-10-CM | POA: Diagnosis present

## 2012-06-30 DIAGNOSIS — E1122 Type 2 diabetes mellitus with diabetic chronic kidney disease: Secondary | ICD-10-CM | POA: Diagnosis present

## 2012-06-30 DIAGNOSIS — E860 Dehydration: Secondary | ICD-10-CM

## 2012-06-30 DIAGNOSIS — M1710 Unilateral primary osteoarthritis, unspecified knee: Secondary | ICD-10-CM | POA: Diagnosis present

## 2012-06-30 DIAGNOSIS — E119 Type 2 diabetes mellitus without complications: Secondary | ICD-10-CM

## 2012-06-30 DIAGNOSIS — Z720 Tobacco use: Secondary | ICD-10-CM

## 2012-06-30 DIAGNOSIS — E1351 Other specified diabetes mellitus with diabetic peripheral angiopathy without gangrene: Secondary | ICD-10-CM | POA: Diagnosis present

## 2012-06-30 DIAGNOSIS — R112 Nausea with vomiting, unspecified: Secondary | ICD-10-CM

## 2012-06-30 DIAGNOSIS — R109 Unspecified abdominal pain: Secondary | ICD-10-CM

## 2012-06-30 DIAGNOSIS — E876 Hypokalemia: Secondary | ICD-10-CM | POA: Diagnosis present

## 2012-06-30 DIAGNOSIS — R932 Abnormal findings on diagnostic imaging of liver and biliary tract: Secondary | ICD-10-CM

## 2012-06-30 LAB — URINALYSIS, ROUTINE W REFLEX MICROSCOPIC
Ketones, ur: NEGATIVE mg/dL
Leukocytes, UA: NEGATIVE
Nitrite: NEGATIVE
Specific Gravity, Urine: 1.02 (ref 1.005–1.030)
Urobilinogen, UA: 0.2 mg/dL (ref 0.0–1.0)
pH: 6.5 (ref 5.0–8.0)

## 2012-06-30 LAB — CBC WITH DIFFERENTIAL/PLATELET
Basophils Absolute: 0 10*3/uL (ref 0.0–0.1)
Basophils Relative: 0 % (ref 0–1)
Eosinophils Absolute: 0 10*3/uL (ref 0.0–0.7)
Eosinophils Relative: 0 % (ref 0–5)
MCH: 29.4 pg (ref 26.0–34.0)
MCHC: 33.7 g/dL (ref 30.0–36.0)
MCV: 87.2 fL (ref 78.0–100.0)
Platelets: 251 10*3/uL (ref 150–400)
RDW: 14.9 % (ref 11.5–15.5)
WBC: 7.4 10*3/uL (ref 4.0–10.5)

## 2012-06-30 LAB — COMPREHENSIVE METABOLIC PANEL
ALT: 12 U/L (ref 0–35)
AST: 16 U/L (ref 0–37)
Calcium: 10.3 mg/dL (ref 8.4–10.5)
Creatinine, Ser: 1.33 mg/dL — ABNORMAL HIGH (ref 0.50–1.10)
Sodium: 133 mEq/L — ABNORMAL LOW (ref 135–145)
Total Protein: 8.2 g/dL (ref 6.0–8.3)

## 2012-06-30 LAB — URINE MICROSCOPIC-ADD ON

## 2012-06-30 LAB — LACTIC ACID, PLASMA: Lactic Acid, Venous: 1.3 mmol/L (ref 0.5–2.2)

## 2012-06-30 LAB — TROPONIN I: Troponin I: <0.3 ng/mL (ref <0.30)

## 2012-06-30 MED ORDER — SODIUM CHLORIDE 0.9 % IV SOLN
80.0000 mg | Freq: Once | INTRAVENOUS | Status: AC
  Administered 2012-06-30: 80 mg via INTRAVENOUS
  Filled 2012-06-30: qty 80

## 2012-06-30 MED ORDER — PANTOPRAZOLE SODIUM 40 MG IV SOLR
INTRAVENOUS | Status: AC
  Filled 2012-06-30: qty 80

## 2012-06-30 MED ORDER — FAMOTIDINE IN NACL 20-0.9 MG/50ML-% IV SOLN
20.0000 mg | Freq: Once | INTRAVENOUS | Status: AC
  Administered 2012-06-30: 20 mg via INTRAVENOUS
  Filled 2012-06-30: qty 50

## 2012-06-30 MED ORDER — MORPHINE SULFATE 4 MG/ML IJ SOLN
4.0000 mg | INTRAMUSCULAR | Status: DC | PRN
  Administered 2012-06-30: 4 mg via INTRAVENOUS
  Filled 2012-06-30: qty 1

## 2012-06-30 MED ORDER — ONDANSETRON HCL 4 MG/2ML IJ SOLN
4.0000 mg | INTRAMUSCULAR | Status: AC | PRN
  Administered 2012-06-30 – 2012-07-01 (×2): 4 mg via INTRAVENOUS
  Filled 2012-06-30 (×2): qty 2

## 2012-06-30 MED ORDER — GI COCKTAIL ~~LOC~~
30.0000 mL | Freq: Once | ORAL | Status: AC
  Administered 2012-06-30: 30 mL via ORAL
  Filled 2012-06-30: qty 30

## 2012-06-30 MED ORDER — SODIUM CHLORIDE 0.9 % IV SOLN
INTRAVENOUS | Status: DC
  Administered 2012-06-30 – 2012-07-01 (×2): via INTRAVENOUS

## 2012-06-30 NOTE — ED Notes (Signed)
abd and chest pain, nausea , vomiting,  This is 3rd visit for same.  Seen here yesterday

## 2012-06-30 NOTE — ED Provider Notes (Signed)
History     CSN: [id number]  Arrival date & time 06/30/12  2120   First MD Initiated Contact with Patient 06/30/12 2146      Chief Complaint  Patient presents with  . Abdominal Pain     HPI Pt was seen at 2150.   Per pt, c/o gradual onset and persistence of constant upper abd "pain" for the past 3 days.  Has been associated with multiple intermittent episodes of N/V.  Describes the abd pain as radiating up into her chest and throat.  Has been associated with "constipation," with her last BM 3 to 4 days ago. This is pt's 3rd ED evaluation in the past 2 days, as well as eval by her PMD today, for same.  States she returns again today because her symptoms continue.  Denies diarrhea, no fevers, no back pain, no rash, no CP/SOB, no black or blood in stools or emesis.       Past Medical History  Diagnosis Date  . Hypertension   . Diabetes mellitus     non insulin  . Arthritis   . History of blood clots   . Homocysteinemia 11/29/2010  . History of gout     right ring finger  . Left knee pain   . Kidney atrophy     rt.    Past Surgical History  Procedure Laterality Date  . Abdominal hysterectomy    . Knee surgery      rt. arthroscopic  . Tonsillectomy    . Hypertension    . Type ll diabetes    . Neck surgery      Family History  Problem Relation Age of Onset  . Stroke Mother   . Cancer Father   . Diabetes Maternal Aunt   . Diabetes Maternal Uncle   . Diabetes Maternal Grandmother     History  Substance Use Topics  . Smoking status: Current Every Day Smoker -- 1.00 packs/day for 25 years    Types: Cigarettes  . Smokeless tobacco: Never Used  . Alcohol Use: Yes     Comment: occasionally    Review of Systems ROS: Statement: All systems negative except as marked or noted in the HPI; Constitutional: Negative for fever and chills. ; ; Eyes: Negative for eye pain, redness and discharge. ; ; ENMT: Negative for ear pain, hoarseness, nasal congestion, sinus pressure and  sore throat. ; ; Cardiovascular: Negative for chest pain, palpitations, diaphoresis, dyspnea and peripheral edema. ; ; Respiratory: Negative for cough, wheezing and stridor. ; ; Gastrointestinal: +abd pain, N/V. Negative for diarrhea, blood in stool, hematemesis, jaundice and rectal bleeding. . ; ; Genitourinary: Negative for dysuria, flank pain and hematuria. ; ; Musculoskeletal: Negative for back pain and neck pain. Negative for swelling and trauma.; ; Skin: Negative for pruritus, rash, abrasions, blisters, bruising and skin lesion.; ; Neuro: Negative for headache, lightheadedness and neck stiffness. Negative for weakness, altered level of consciousness , altered mental status, extremity weakness, paresthesias, involuntary movement, seizure and syncope.       Allergies  Ace inhibitors; Celecoxib; and Hydrocodone-acetaminophen  Home Medications   Current Outpatient Rx  Name  Route  Sig  Dispense  Refill  . acetaminophen (TYLENOL) 500 MG tablet   Oral   Take 500 mg by mouth every 6 (six) hours as needed for pain. Takes 2 as needed for discomfort         . Ascorbic Acid (VITAMIN C) 1000 MG tablet   Oral   Take 1,000  mg by mouth daily.         Marland Kitchen aspirin 81 MG tablet   Oral   Take 81 mg by mouth every evening.          Marland Kitchen b complex vitamins tablet   Oral   Take 1 tablet by mouth daily. Takes 2 in the morning.         . Cholecalciferol (VITAMIN D3) 1000 UNITS CAPS   Oral   Take 1 tablet by mouth daily.           Marland Kitchen dexlansoprazole (DEXILANT) 60 MG capsule   Oral   Take 60 mg by mouth every evening.          . fish oil-omega-3 fatty acids 1000 MG capsule   Oral   Take 1 g by mouth daily.          . Flaxseed, Linseed, 1000 MG CAPS   Oral   Take 1 capsule by mouth daily.         . folic acid (FOLVITE) 1 MG tablet   Oral   Take 1 tablet (1 mg total) by mouth daily.   60 tablet   3   . glimepiride (AMARYL) 2 MG tablet   Oral   Take 2 mg by mouth 2 (two) times  daily.         Marland Kitchen lidocaine (LIDODERM) 5 %   Transdermal   Place 1 patch onto the skin daily. Remove & Discard patch within 12 hours or as directed by MD         . linagliptin (TRADJENTA) 5 MG TABS tablet   Oral   Take 5 mg by mouth daily.           Marland Kitchen NIFEdipine (PROCARDIA XL/ADALAT-CC) 60 MG 24 hr tablet   Oral   Take 60 mg by mouth daily.           . ondansetron (ZOFRAN ODT) 4 MG disintegrating tablet   Oral   Take 1 tablet (4 mg total) by mouth every 8 (eight) hours as needed for nausea.   20 tablet   0   . oxyCODONE-acetaminophen (PERCOCET/ROXICET) 5-325 MG per tablet   Oral   Take 1-2 tablets by mouth every 4 (four) hours as needed for pain.   10 tablet   0   . pioglitazone (ACTOS) 30 MG tablet   Oral   Take 30 mg by mouth daily.           . promethazine (PHENERGAN) 25 MG tablet   Oral   Take 0.5 tablets (12.5 mg total) by mouth every 8 (eight) hours as needed for nausea.   20 tablet   0   . PROVENTIL HFA 108 (90 BASE) MCG/ACT inhaler   Inhalation   Inhale 1 puff into the lungs daily as needed. For shortness of breath/wheezing         . simvastatin (ZOCOR) 20 MG tablet   Oral   Take 20 mg by mouth At bedtime.         . sucralfate (CARAFATE) 1 G tablet   Oral   Take 1 tablet (1 g total) by mouth 4 (four) times daily.   30 tablet   0   . zolpidem (AMBIEN) 10 MG tablet   Oral   Take 10 mg by mouth at bedtime as needed for sleep.           BP 166/78  Pulse 93  Temp(Src) 98.3 F (36.8 C) (Oral)  Resp 16  Ht 5\' 10"  (1.778 m)  Wt 225 lb (102.059 kg)  BMI 32.28 kg/m2  SpO2 100%  Physical Exam 2155: Physical examination:  Nursing notes reviewed; Vital signs and O2 SAT reviewed;  Constitutional: Well developed, Well nourished, Uncomfortable appearing; Head:  Normocephalic, atraumatic; Eyes: EOMI, PERRL, No scleral icterus; ENMT: Mouth and pharynx normal, Mucous membranes dry; Neck: Supple, Full range of motion, No lymphadenopathy;  Cardiovascular: Regular rate and rhythm, No gallop; Respiratory: Breath sounds clear & equal bilaterally, No rales, rhonchi, wheezes.  Speaking full sentences with ease, Normal respiratory effort/excursion; Chest: Nontender, Movement normal; Abdomen: Soft, +diffuse tenderness to palp, esp mid-epigastric area. No rebound or guarding. Nondistended, Normal bowel sounds; Genitourinary: No CVA tenderness; Extremities: Pulses normal, No tenderness, No edema, No calf edema or asymmetry.; Neuro: AA&Ox3, Major CN grossly intact.  Speech clear. No gross focal motor or sensory deficits in extremities.; Skin: Color normal, Warm, Dry.   ED Course  Procedures     MDM  MDM Reviewed: previous chart, nursing note and vitals Reviewed previous: labs, ECG, x-ray and CT scan Interpretation: labs and ECG      Date: 06/30/2012  Rate: 87  Rhythm: normal sinus rhythm  QRS Axis: normal  Intervals: normal  ST/T Wave abnormalities: normal  Conduction Disutrbances:none  Narrative Interpretation:   Old EKG Reviewed: unchanged; no significant changes from previous EKG dated 06/29/2012.  Results for orders placed during the hospital encounter of 06/30/12  CBC WITH DIFFERENTIAL      Result Value Range   WBC 7.4  4.0 - 10.5 K/uL   RBC 4.62  3.87 - 5.11 MIL/uL   Hemoglobin 13.6  12.0 - 15.0 g/dL   HCT 81.1  91.4 - 78.2 %   MCV 87.2  78.0 - 100.0 fL   MCH 29.4  26.0 - 34.0 pg   MCHC 33.7  30.0 - 36.0 g/dL   RDW 95.6  21.3 - 08.6 %   Platelets 251  150 - 400 K/uL   Neutrophils Relative 81 (*) 43 - 77 %   Neutro Abs 6.0  1.7 - 7.7 K/uL   Lymphocytes Relative 14  12 - 46 %   Lymphs Abs 1.0  0.7 - 4.0 K/uL   Monocytes Relative 5  3 - 12 %   Monocytes Absolute 0.4  0.1 - 1.0 K/uL   Eosinophils Relative 0  0 - 5 %   Eosinophils Absolute 0.0  0.0 - 0.7 K/uL   Basophils Relative 0  0 - 1 %   Basophils Absolute 0.0  0.0 - 0.1 K/uL  COMPREHENSIVE METABOLIC PANEL      Result Value Range   Sodium 133 (*) 135 - 145  mEq/L   Potassium 3.5  3.5 - 5.1 mEq/L   Chloride 91 (*) 96 - 112 mEq/L   CO2 27  19 - 32 mEq/L   Glucose, Bld 218 (*) 70 - 99 mg/dL   BUN 13  6 - 23 mg/dL   Creatinine, Ser 5.78 (*) 0.50 - 1.10 mg/dL   Calcium 46.9  8.4 - 62.9 mg/dL   Total Protein 8.2  6.0 - 8.3 g/dL   Albumin 4.0  3.5 - 5.2 g/dL   AST 16  0 - 37 U/L   ALT 12  0 - 35 U/L   Alkaline Phosphatase 106  39 - 117 U/L   Total Bilirubin 0.3  0.3 - 1.2 mg/dL   GFR calc non Af Amer 40 (*) >90 mL/min   GFR  calc Af Amer 46 (*) >90 mL/min  LIPASE, BLOOD      Result Value Range   Lipase 65 (*) 11 - 59 U/L  TROPONIN I      Result Value Range   Troponin I <0.30  <0.30 ng/mL  LACTIC ACID, PLASMA      Result Value Range   Lactic Acid, Venous 1.3  0.5 - 2.2 mmol/L  URINALYSIS, ROUTINE W REFLEX MICROSCOPIC      Result Value Range   Color, Urine YELLOW  YELLOW   APPearance CLEAR  CLEAR   Specific Gravity, Urine 1.020  1.005 - 1.030   pH 6.5  5.0 - 8.0   Glucose, UA 100 (*) NEGATIVE mg/dL   Hgb urine dipstick TRACE (*) NEGATIVE   Bilirubin Urine NEGATIVE  NEGATIVE   Ketones, ur NEGATIVE  NEGATIVE mg/dL   Protein, ur 30 (*) NEGATIVE mg/dL   Urobilinogen, UA 0.2  0.0 - 1.0 mg/dL   Nitrite NEGATIVE  NEGATIVE   Leukocytes, UA NEGATIVE  NEGATIVE  URINE MICROSCOPIC-ADD ON      Result Value Range   Squamous Epithelial / LPF RARE  RARE   RBC / HPF 0-2  <3 RBC/hpf   Dg Chest 2 View 06/29/2012  *RADIOLOGY REPORT*  Clinical Data: Chest pain  CHEST - 2 VIEW  Comparison: 06/28/2012  Findings: Mild cardiomegaly. Mild patchy density at the posterior left lung base.  No pneumothorax or pleural effusion.  Increased AP diameter chest and bronchitic changes are noted.  Stable thoracic spine.  IMPRESSION: Cardiomegaly without decompensation.  Mild patchy atelectasis verses airspace disease at the posterior left lung base.  Changes related to COPD.   Original Report Authenticated By: Jolaine Click, M.D.    Ct Abdomen Pelvis W Contrast 06/29/2012   *RADIOLOGY REPORT*  Clinical Data: Lower abdominal pain and emesis.  CT ABDOMEN AND PELVIS WITH CONTRAST  Technique:  Multidetector CT imaging of the abdomen and pelvis was performed following the standard protocol during bolus administration of intravenous contrast.  Contrast: 50mL OMNIPAQUE IOHEXOL 300 MG/ML  SOLN, OMNIPAQUE IOHEXOL 300 MG/ML  SOLN  Comparison: 11/17/2010  Findings: Diffuse hepatic steatosis.  Small gallstones are suspected, best viewed on sagittal reconstruction imaging.  Lesion in the lower pole of the left kidney is less distinct and smaller on image 37.  On image 29, the 2.5 cm enhancing lesion in the right kidney has enlarged worrisome for renal cell carcinoma.  Other hypodensities in both kidneys are stable.  Severe atrophy of the right kidney is again noted.  There is extensive stranding surrounding the right kidney.  No definite ureteral calculus.  Stable left adrenal nodule.  Right adrenal gland and pancreas are within normal limits.  Normal appendix.  Sigmoid diverticulosis without evidence of acute diverticulitis.  Advanced facet arthropathy with spinal stenosis at in the lower lumbar spine.  No vertebral compression deformity.  No evidence of free fluid.  Unremarkable bladder.  IMPRESSION: Inflammatory changes of the right kidney are suspected.  Cholelithiasis is suspected and cannot be confirmed with sonography.  Lesion in the right kidney is larger worrisome for renal cell carcinoma.  This was incompletely characterized on a previous MRI. Repeat MR may be helpful when the patient can hold her breath.   Original Report Authenticated By: Jolaine Click, M.D.     Results for JNAE, THOMASTON (MRN [id number]) as of 06/30/2012 23:25  Ref. Range 12/24/2011 17:10 06/28/2012 04:46 06/30/2012 22:04  BUN Latest Range: 6-23 mg/dL 13 12 13  Creatinine Latest Range: 0.50-1.10 mg/dL 1.32 4.40 1.02 (H)    7253:  Pt continues to c/o abd pain and nausea despite IV meds. CT scan yesterday  suspected small gallstones; will need [place] abd. BUN/Cr elevated from previous.  Will admit for rehydration, pain/nausea control, [place] abd, likely need EGD.  Dx and testing d/w pt and family.  Questions answered.  Verb understanding, agreeable to observation admit.  T/C to [facility] Dr. Rito Ehrlich, case discussed, including:  HPI, pertinent PM/SHx, VS/PE, dx testing, ED course and treatment:  Agreeable to observation admit, requests to write temporary orders, obtain medical bed to team 2.           Laray Anger, DO 07/02/12 [id number]

## 2012-07-01 ENCOUNTER — Inpatient Hospital Stay (HOSPITAL_COMMUNITY): Payer: Medicare Other

## 2012-07-01 ENCOUNTER — Encounter (HOSPITAL_COMMUNITY): Payer: Self-pay | Admitting: Internal Medicine

## 2012-07-01 DIAGNOSIS — F17200 Nicotine dependence, unspecified, uncomplicated: Secondary | ICD-10-CM

## 2012-07-01 DIAGNOSIS — R932 Abnormal findings on diagnostic imaging of liver and biliary tract: Secondary | ICD-10-CM

## 2012-07-01 DIAGNOSIS — E860 Dehydration: Secondary | ICD-10-CM

## 2012-07-01 DIAGNOSIS — E1122 Type 2 diabetes mellitus with diabetic chronic kidney disease: Secondary | ICD-10-CM | POA: Diagnosis present

## 2012-07-01 DIAGNOSIS — G8929 Other chronic pain: Secondary | ICD-10-CM

## 2012-07-01 DIAGNOSIS — Z720 Tobacco use: Secondary | ICD-10-CM | POA: Diagnosis present

## 2012-07-01 DIAGNOSIS — R109 Unspecified abdominal pain: Secondary | ICD-10-CM

## 2012-07-01 DIAGNOSIS — E1351 Other specified diabetes mellitus with diabetic peripheral angiopathy without gangrene: Secondary | ICD-10-CM | POA: Diagnosis present

## 2012-07-01 DIAGNOSIS — E119 Type 2 diabetes mellitus without complications: Secondary | ICD-10-CM

## 2012-07-01 DIAGNOSIS — M549 Dorsalgia, unspecified: Secondary | ICD-10-CM | POA: Diagnosis present

## 2012-07-01 DIAGNOSIS — N2889 Other specified disorders of kidney and ureter: Secondary | ICD-10-CM | POA: Diagnosis present

## 2012-07-01 LAB — GLUCOSE, CAPILLARY
Glucose-Capillary: 154 mg/dL — ABNORMAL HIGH (ref 70–99)
Glucose-Capillary: 209 mg/dL — ABNORMAL HIGH (ref 70–99)

## 2012-07-01 LAB — COMPREHENSIVE METABOLIC PANEL
ALT: 10 U/L (ref 0–35)
AST: 13 U/L (ref 0–37)
Albumin: 3.5 g/dL (ref 3.5–5.2)
Alkaline Phosphatase: 92 U/L (ref 39–117)
BUN: 12 mg/dL (ref 6–23)
CO2: 27 mEq/L (ref 19–32)
Calcium: 9.6 mg/dL (ref 8.4–10.5)
Chloride: 97 mEq/L (ref 96–112)
Creatinine, Ser: 1.26 mg/dL — ABNORMAL HIGH (ref 0.50–1.10)
GFR calc Af Amer: 50 mL/min — ABNORMAL LOW (ref >90)
GFR calc non Af Amer: 43 mL/min — ABNORMAL LOW (ref >90)
Glucose, Bld: 129 mg/dL — ABNORMAL HIGH (ref 70–99)
Potassium: 3 mEq/L — ABNORMAL LOW (ref 3.5–5.1)
Sodium: 134 mEq/L — ABNORMAL LOW (ref 135–145)
Total Bilirubin: 0.3 mg/dL (ref 0.3–1.2)
Total Protein: 7.3 g/dL (ref 6.0–8.3)

## 2012-07-01 LAB — HEMOGLOBIN A1C
Hgb A1c MFr Bld: 6.8 % — ABNORMAL HIGH (ref <5.7)
Mean Plasma Glucose: 148 mg/dL — ABNORMAL HIGH (ref <117)

## 2012-07-01 LAB — CBC
HCT: 38.2 % (ref 36.0–46.0)
Hemoglobin: 12.7 g/dL (ref 12.0–15.0)
MCHC: 33.2 g/dL (ref 30.0–36.0)
MCV: 87.6 fL (ref 78.0–100.0)

## 2012-07-01 LAB — URINE CULTURE

## 2012-07-01 LAB — PROTIME-INR: INR: 1.06 (ref 0.00–1.49)

## 2012-07-01 LAB — LIPASE, BLOOD: Lipase: 54 U/L (ref 11–59)

## 2012-07-01 MED ORDER — HYDRALAZINE HCL 20 MG/ML IJ SOLN: 10.0000 mg | Freq: Four times a day (QID) | INTRAMUSCULAR | Status: DC | PRN

## 2012-07-01 MED ORDER — ACETAMINOPHEN 650 MG RE SUPP: 650.0000 mg | Freq: Four times a day (QID) | RECTAL | Status: DC | PRN

## 2012-07-01 MED ORDER — HYDROMORPHONE HCL PF 1 MG/ML IJ SOLN
1.0000 mg | Freq: Once | INTRAMUSCULAR | Status: AC
  Administered 2012-07-01: 1 mg via INTRAVENOUS
  Filled 2012-07-01: qty 1

## 2012-07-01 MED ORDER — NIFEDIPINE ER OSMOTIC RELEASE 30 MG PO TB24
60.0000 mg | ORAL_TABLET | Freq: Every day | ORAL | Status: DC
  Administered 2012-07-01 – 2012-07-04 (×4): 60 mg via ORAL
  Filled 2012-07-01 (×3): qty 2
  Filled 2012-07-01: qty 1
  Filled 2012-07-01: qty 2

## 2012-07-01 MED ORDER — ALBUTEROL SULFATE (5 MG/ML) 0.5% IN NEBU: 2.5000 mg | INHALATION_SOLUTION | RESPIRATORY_TRACT | Status: DC | PRN

## 2012-07-01 MED ORDER — INSULIN ASPART 100 UNIT/ML ~~LOC~~ SOLN
0.0000 [IU] | SUBCUTANEOUS | Status: DC
  Administered 2012-07-01: 5 [IU] via SUBCUTANEOUS
  Administered 2012-07-02: 2 [IU] via SUBCUTANEOUS

## 2012-07-01 MED ORDER — CIPROFLOXACIN IN D5W 400 MG/200ML IV SOLN
400.0000 mg | Freq: Two times a day (BID) | INTRAVENOUS | Status: DC
  Administered 2012-07-01 – 2012-07-02 (×4): 400 mg via INTRAVENOUS
  Filled 2012-07-01 (×14): qty 200

## 2012-07-01 MED ORDER — ACETAMINOPHEN 325 MG PO TABS: 650.0000 mg | ORAL_TABLET | Freq: Four times a day (QID) | ORAL | Status: DC | PRN

## 2012-07-01 MED ORDER — NICOTINE 14 MG/24HR TD PT24
14.0000 mg | MEDICATED_PATCH | Freq: Every day | TRANSDERMAL | Status: DC
  Administered 2012-07-01 – 2012-07-04 (×4): 14 mg via TRANSDERMAL
  Filled 2012-07-01 (×4): qty 1

## 2012-07-01 MED ORDER — PANTOPRAZOLE SODIUM 40 MG IV SOLR
40.0000 mg | Freq: Two times a day (BID) | INTRAVENOUS | Status: DC
  Administered 2012-07-01 – 2012-07-02 (×4): 40 mg via INTRAVENOUS
  Filled 2012-07-01 (×4): qty 40

## 2012-07-01 MED ORDER — ONDANSETRON HCL 4 MG/2ML IJ SOLN: 4.0000 mg | Freq: Four times a day (QID) | INTRAMUSCULAR | Status: DC | PRN

## 2012-07-01 MED ORDER — HYDROMORPHONE HCL PF 1 MG/ML IJ SOLN
1.0000 mg | INTRAMUSCULAR | Status: DC | PRN
  Administered 2012-07-01 (×2): 1 mg via INTRAVENOUS
  Filled 2012-07-01 (×2): qty 1

## 2012-07-01 MED ORDER — ONDANSETRON HCL 4 MG PO TABS: 4.0000 mg | ORAL_TABLET | Freq: Four times a day (QID) | ORAL | Status: DC | PRN

## 2012-07-01 MED ORDER — CIPROFLOXACIN IN D5W 400 MG/200ML IV SOLN
INTRAVENOUS | Status: AC
  Filled 2012-07-01: qty 200

## 2012-07-01 NOTE — H&P (Addendum)
[facility] History and Physical  [name] UJW:[id number] DOB: June 13, 1945 DOA: 06/30/2012   PCP: Colette Ribas, MD  Specialists: She is followed by Dr. Allyson Sabal, with cardiology for possible renal artery stenosis. She is followed by Dr. Cassell Smiles, a urologist. She's followed by Dr. Trey Sailors, who is her neurosurgeon.  Chief Complaint: Abdominal pain, nausea since Friday  HPI: [name] is a 67 y.o. female the past with history of, diabetes, chronic back pain, who was in her usual state of health till Friday when she started developing abdominal pain, followed by onset of nausea. The abdominal pain is predominantly located in the upper abdomen. She wasn't able to specify which part. The pain radiated through her chest into the neck area. She became very nauseous, but did not have any vomiting. She admits to a history of acid reflux. Denies diarrhea, and has been constipated since Thursday. She had 2 enemas over the last couple days and had small black colored stool. She admits to taking Pepto-Bismol on Friday. The pain was 10 out of 10 in intensity. It is a achy pain, and is constant. She's never had similar pain in the past. She denies any history of endoscopies. Has had chills, but denies fevers. Denies any problems with urination. Has had poor oral intake in the last few days and feels dehydrated. This is her third visit to the emergency department for same reason.  Home Medications: Prior to Admission medications   Medication Sig Start Date End Date Taking? Authorizing Provider  acetaminophen (TYLENOL) 500 MG tablet Take 500 mg by mouth every 6 (six) hours as needed for pain. Takes 2 as needed for discomfort   Yes Historical Provider, MD  Ascorbic Acid (VITAMIN C) 1000 MG tablet Take 1,000 mg by mouth daily.   Yes Historical Provider, MD  aspirin 81 MG tablet Take 81 mg by mouth every evening.    Yes Historical Provider, MD  b complex vitamins tablet Take 1 tablet by mouth  daily. Takes 2 in the morning.   Yes Historical Provider, MD  Cholecalciferol (VITAMIN D3) 1000 UNITS CAPS Take 1 tablet by mouth daily.     Yes Historical Provider, MD  dexlansoprazole (DEXILANT) 60 MG capsule Take 60 mg by mouth every evening.    Yes Historical Provider, MD  fish oil-omega-3 fatty acids 1000 MG capsule Take 1 g by mouth daily.    Yes Historical Provider, MD  Flaxseed, Linseed, 1000 MG CAPS Take 1 capsule by mouth daily.   Yes Historical Provider, MD  folic acid (FOLVITE) 1 MG tablet Take 1 tablet (1 mg total) by mouth daily. 05/05/12  Yes Maurine Minister Kefalas, PA-C  glimepiride (AMARYL) 2 MG tablet Take 2 mg by mouth 2 (two) times daily.   Yes Historical Provider, MD  lidocaine (LIDODERM) 5 % Place 1 patch onto the skin daily. Remove & Discard patch within 12 hours or as directed by MD   Yes Historical Provider, MD  linagliptin (TRADJENTA) 5 MG TABS tablet Take 5 mg by mouth daily.     Yes Historical Provider, MD  NIFEdipine (PROCARDIA XL/ADALAT-CC) 60 MG 24 hr tablet Take 60 mg by mouth daily.     Yes Historical Provider, MD  ondansetron (ZOFRAN ODT) 4 MG disintegrating tablet Take 1 tablet (4 mg total) by mouth every 8 (eight) hours as needed for nausea. 06/28/12  Yes Nicoletta Dress. Colon Branch, MD  oxyCODONE-acetaminophen (PERCOCET/ROXICET) 5-325 MG per tablet Take 1-2 tablets by mouth every 4 (four) hours  as needed for pain. 06/29/12  Yes Raeford Razor, MD  pioglitazone (ACTOS) 30 MG tablet Take 30 mg by mouth daily.     Yes Historical Provider, MD  promethazine (PHENERGAN) 25 MG tablet Take 0.5 tablets (12.5 mg total) by mouth every 8 (eight) hours as needed for nausea. 06/29/12  Yes Raeford Razor, MD  PROVENTIL HFA 108 (90 BASE) MCG/ACT inhaler Inhale 1 puff into the lungs daily as needed. For shortness of breath/wheezing 08/20/11  Yes Historical Provider, MD  simvastatin (ZOCOR) 20 MG tablet Take 20 mg by mouth At bedtime.   Yes Historical Provider, MD  sucralfate (CARAFATE) 1 G tablet Take 1  tablet (1 g total) by mouth 4 (four) times daily. 06/29/12  Yes Raeford Razor, MD  zolpidem (AMBIEN) 10 MG tablet Take 10 mg by mouth at bedtime as needed for sleep.   Yes Historical Provider, MD    Allergies:  Allergies  Allergen Reactions  . Ace Inhibitors Swelling  . Celecoxib Other (See Comments)    REACTION: Kidney issue  . Hydrocodone-Acetaminophen Itching    BRAND NAME: VICODIN    Past Medical History: Past Medical History  Diagnosis Date  . Hypertension   . Diabetes mellitus     non insulin  . Arthritis   . History of blood clots   . Homocysteinemia 11/29/2010  . History of gout     right ring finger  . Left knee pain   . Kidney atrophy     rt.    Past Surgical History  Procedure Laterality Date  . Abdominal hysterectomy    . Knee surgery      rt. arthroscopic  . Tonsillectomy    . Hypertension    . Type ll diabetes    . Neck surgery      Social History:  reports that she has been smoking Cigarettes.  She has a 25 pack-year smoking history. She has never used smokeless tobacco. She reports that  drinks alcohol. She reports that she does not use illicit drugs.  Living Situation: Lives with her husband Activity Level: Usually independent with daily activities   Family History:  Family History  Problem Relation Age of Onset  . Stroke Mother   . Cancer Father   . Diabetes Maternal Aunt   . Diabetes Maternal Uncle   . Diabetes Maternal Grandmother      Review of Systems - History obtained from the patient General ROS: positive for  - chills and fatigue Psychological ROS: negative Ophthalmic ROS: negative ENT ROS: negative Allergy and Immunology ROS: negative Hematological and Lymphatic ROS: negative Endocrine ROS: negative Respiratory ROS: no cough, shortness of breath, or wheezing Cardiovascular ROS: no chest pain or dyspnea on exertion Gastrointestinal ROS: as in hpi Genito-Urinary ROS: no dysuria, trouble voiding, or hematuria Musculoskeletal  ROS: negative Neurological ROS: no TIA or stroke symptoms Dermatological ROS: negative  Physical Examination  Filed Vitals:   06/30/12 2123  BP: 166/78  Pulse: 93  Temp: 98.3 F (36.8 C)  TempSrc: Oral  Resp: 16  Height: 5\' 10"  (1.778 m)  Weight: 102.059 kg (225 lb)  SpO2: 100%    General appearance: alert, cooperative, appears stated age and no distress Head: Normocephalic, without obvious abnormality, atraumatic Eyes: conjunctivae/corneas clear. PERRL, EOM's intact.  Throat: Dry mm Neck: no adenopathy, no carotid bruit, no JVD, supple, symmetrical, trachea midline and thyroid not enlarged, symmetric, no tenderness/mass/nodules Resp: clear to auscultation bilaterally Cardio: regular rate and rhythm, S1, S2 normal, no murmur, click, rub  or gallop GI: Abdomen soft. Tenderness is present diffusely, but mostly localized to the right upper quadrant and epigastric area. Murphy sign was equivocal. No masses, or organomegaly. Bowel sounds are present. Extremities: extremities normal, atraumatic, no cyanosis or edema Pulses: 2+ and symmetric Skin: Skin color, texture, turgor normal. No rashes or lesions Lymph nodes: Cervical, supraclavicular, and axillary nodes normal. Neurologic: Slightly lethargic, but easily arousable. Oriented x3. No focal neurological deficits.  Laboratory Data: Results for orders placed during the hospital encounter of 06/30/12 (from the past 48 hour(s))  CBC WITH DIFFERENTIAL     Status: Abnormal   Collection Time    06/30/12 10:04 PM      Result Value Range   WBC 7.4  4.0 - 10.5 K/uL   RBC 4.62  3.87 - 5.11 MIL/uL   Hemoglobin 13.6  12.0 - 15.0 g/dL   HCT 28.4  13.2 - 44.0 %   MCV 87.2  78.0 - 100.0 fL   MCH 29.4  26.0 - 34.0 pg   MCHC 33.7  30.0 - 36.0 g/dL   RDW 10.2  72.5 - 36.6 %   Platelets 251  150 - 400 K/uL   Neutrophils Relative 81 (*) 43 - 77 %   Neutro Abs 6.0  1.7 - 7.7 K/uL   Lymphocytes Relative 14  12 - 46 %   Lymphs Abs 1.0  0.7 -  4.0 K/uL   Monocytes Relative 5  3 - 12 %   Monocytes Absolute 0.4  0.1 - 1.0 K/uL   Eosinophils Relative 0  0 - 5 %   Eosinophils Absolute 0.0  0.0 - 0.7 K/uL   Basophils Relative 0  0 - 1 %   Basophils Absolute 0.0  0.0 - 0.1 K/uL  COMPREHENSIVE METABOLIC PANEL     Status: Abnormal   Collection Time    06/30/12 10:04 PM      Result Value Range   Sodium 133 (*) 135 - 145 mEq/L   Potassium 3.5  3.5 - 5.1 mEq/L   Chloride 91 (*) 96 - 112 mEq/L   CO2 27  19 - 32 mEq/L   Glucose, Bld 218 (*) 70 - 99 mg/dL   BUN 13  6 - 23 mg/dL   Creatinine, Ser 4.40 (*) 0.50 - 1.10 mg/dL   Calcium 34.7  8.4 - 42.5 mg/dL   Total Protein 8.2  6.0 - 8.3 g/dL   Albumin 4.0  3.5 - 5.2 g/dL   AST 16  0 - 37 U/L   ALT 12  0 - 35 U/L   Alkaline Phosphatase 106  39 - 117 U/L   Total Bilirubin 0.3  0.3 - 1.2 mg/dL   GFR calc non Af Amer 40 (*) >90 mL/min   GFR calc Af Amer 46 (*) >90 mL/min   Comment:            The eGFR has been calculated     using the CKD EPI equation.     This calculation has not been     validated in all clinical     situations.     eGFR's persistently     <90 mL/min signify     possible Chronic Kidney Disease.  LIPASE, BLOOD     Status: Abnormal   Collection Time    06/30/12 10:04 PM      Result Value Range   Lipase 65 (*) 11 - 59 U/L  TROPONIN I     Status: None  Collection Time    06/30/12 10:04 PM      Result Value Range   Troponin I <0.30  <0.30 ng/mL   Comment:            Due to the release kinetics of cTnI,     a negative result within the first hours     of the onset of symptoms does not rule out     myocardial infarction with certainty.     If myocardial infarction is still suspected,     repeat the test at appropriate intervals.  LACTIC ACID, PLASMA     Status: None   Collection Time    06/30/12 10:04 PM      Result Value Range   Lactic Acid, Venous 1.3  0.5 - 2.2 mmol/L  URINALYSIS, ROUTINE W REFLEX MICROSCOPIC     Status: Abnormal   Collection Time     06/30/12 11:37 PM      Result Value Range   Color, Urine YELLOW  YELLOW   APPearance CLEAR  CLEAR   Specific Gravity, Urine 1.020  1.005 - 1.030   pH 6.5  5.0 - 8.0   Glucose, UA 100 (*) NEGATIVE mg/dL   Hgb urine dipstick TRACE (*) NEGATIVE   Bilirubin Urine NEGATIVE  NEGATIVE   Ketones, ur NEGATIVE  NEGATIVE mg/dL   Protein, ur 30 (*) NEGATIVE mg/dL   Urobilinogen, UA 0.2  0.0 - 1.0 mg/dL   Nitrite NEGATIVE  NEGATIVE   Leukocytes, UA NEGATIVE  NEGATIVE  URINE MICROSCOPIC-ADD ON     Status: None   Collection Time    06/30/12 11:37 PM      Result Value Range   Squamous Epithelial / LPF RARE  RARE   RBC / HPF 0-2  <3 RBC/hpf    Radiology Reports: Dg Chest 2 View  06/29/2012  *RADIOLOGY REPORT*  Clinical Data: Chest pain  CHEST - 2 VIEW  Comparison: 06/28/2012  Findings: Mild cardiomegaly. Mild patchy density at the posterior left lung base.  No pneumothorax or pleural effusion.  Increased AP diameter chest and bronchitic changes are noted.  Stable thoracic spine.  IMPRESSION: Cardiomegaly without decompensation.  Mild patchy atelectasis verses airspace disease at the posterior left lung base.  Changes related to COPD.   Original Report Authenticated By: Jolaine Click, M.D.    Ct Abdomen Pelvis W Contrast  06/29/2012  *RADIOLOGY REPORT*  Clinical Data: Lower abdominal pain and emesis.  CT ABDOMEN AND PELVIS WITH CONTRAST  Technique:  Multidetector CT imaging of the abdomen and pelvis was performed following the standard protocol during bolus administration of intravenous contrast.  Contrast: 50mL OMNIPAQUE IOHEXOL 300 MG/ML  SOLN, OMNIPAQUE IOHEXOL 300 MG/ML  SOLN  Comparison: 11/17/2010  Findings: Diffuse hepatic steatosis.  Small gallstones are suspected, best viewed on sagittal reconstruction imaging.  Lesion in the lower pole of the left kidney is less distinct and smaller on image 37.  On image 29, the 2.5 cm enhancing lesion in the right kidney has enlarged worrisome for renal cell  carcinoma.  Other hypodensities in both kidneys are stable.  Severe atrophy of the right kidney is again noted.  There is extensive stranding surrounding the right kidney.  No definite ureteral calculus.  Stable left adrenal nodule.  Right adrenal gland and pancreas are within normal limits.  Normal appendix.  Sigmoid diverticulosis without evidence of acute diverticulitis.  Advanced facet arthropathy with spinal stenosis at in the lower lumbar spine.  No vertebral compression deformity.  No evidence of free fluid.  Unremarkable bladder.  IMPRESSION: Inflammatory changes of the right kidney are suspected.  Cholelithiasis is suspected and cannot be confirmed with sonography.  Lesion in the right kidney is larger worrisome for renal cell carcinoma.  This was incompletely characterized on a previous MRI. Repeat MR may be helpful when the patient can hold her breath.   Original Report Authenticated By: Jolaine Click, M.D.    Dg Abd Acute W/chest  06/30/2012  *RADIOLOGY REPORT*  Clinical Data: Nominal pain  ACUTE ABDOMEN SERIES (ABDOMEN 2 VIEW & CHEST 1 VIEW)  Comparison: 06/29/2012  Findings: Mild cardiomegaly.  Subsegmental atelectasis at the left base.  Upper lungs clear.  There is no free intraperitoneal gas. Nonspecific air fluid levels in the colon.  No disproportionate dilatation of bowel.  IMPRESSION: Nonobstructive bowel gas pattern.  Subsegmental atelectasis at the left base.   Original Report Authenticated By: Jolaine Click, M.D.     Electrocardiogram: Sinus rhythm at 87 beats per minute. Normal axis. Intervals are normal. No Q waves. No concerning ST or T-wave changes.  Problem List  Principal Problem:   Abdominal pain Active Problems:   ARTHRITIS, LEFT KNEE   Dehydration   Right kidney mass   Tobacco abuse   DM type 2 (diabetes mellitus, type 2)   Chronic back pain   Abnormal gall bladder diagnostic imaging   Assessment: This is a 67 year old, African American female, presents with  abdominal pain, and nausea. She's had multiple evaluations in the last few days including troponins and CAT scan, and abdominal films. Etiology remains unclear, however, possibly related to biliary dyskinesis. Differential diagnoses include diabetic gastroparesis. Gastritis is another possibility. She has incidentally, a right kidney lesion, which is unlikely to be causing her current symptoms. UA was negative for infection. EKG is non-ischemic and troponins are negative.  Plan: #1 abdominal pain: Examination localized to some extent of the right upper quadrant and the epigastric area. She does have elevated lipase level. However, the lipase was normal a few days ago. CT done at that time did not show any evidence for pancreatitis. Lipase could be elevated due to her persistent nausea. CT did suggest Cholelithiasis. We will proceed with ultrasound of her gallbladder tomorrow. We will keep her n.p.o. Will request general surgery evaluation. Give her PPI. She may require further evaluation by gastroenterology if initial workup is negative.  #2 incidental right kidney mass: She'll need to be seen by urologist eventually. This was an incidental discovery. This can be pursued as an outpatient.  #3 history of, diabetes, type II: Check HbA1c. We'll keep her on sliding scale for now.  #4 dehydration: Will give her IV fluids.   #5 tobacco abuse: Nicotine patch will be prescribed.  #6 history of PE in the past: She is no longer on anticoagulation. She is followed by Dr. Mariel Sleet.  DVT Prophylaxis: Sequential compressive devices Code Status: Full code Family Communication: Discussed with the patient and her husband  Disposition Plan: Unclear for now   Further management decisions will depend on results of further testing and patient's response to treatment.  [facility], [place]  [facility] Pager [phone]  If 7PM-7AM, please contact night-coverage [email or web address] Password [email or web address]  07/01/2012, 12:21  AM

## 2012-07-01 NOTE — Progress Notes (Signed)
Patient admitted earlier this morning by Dr. Rito Ehrlich  Patient seen and examined, database reviewed.  She's been admitted for abdominal pain, nausea vomiting. Initial CT showed gallstones. Abdominal ultrasound is currently pending. Discussed with general surgery. Recommendations are if there is significant thickening and fluid around the gallbladder, she may need cholecystectomy. If there are no significant findings around the gallbladder, then GI consult for possible endoscopy may be warranted.  Regarding her renal mass, she sees a urologist in [place] (Dr. Patsi Sears) and can followup with urology for further management as felt appropriate.

## 2012-07-01 NOTE — Care Management Note (Unsigned)
    Page 1 of 1   07/01/2012     3:49:36 PM   CARE MANAGEMENT NOTE 07/01/2012  Patient:  [name], [name]   Account Number:  [id number]  Date Initiated:  07/01/2012  Documentation initiated by:  Rosemary Holms  Subjective/Objective Assessment:   Pt admitted with abdominal pain. Lives at home with her spouse. Surgical consult pending. will follow for DC needs     Action/Plan:   Anticipated DC Date:     Anticipated DC Plan:        DC Planning Services  CM consult      Choice offered to / List presented to:             Status of service:  In process, will continue to follow Medicare Important Message given?   (If response is "NO", the following Medicare IM given date fields will be blank) Date Medicare IM given:   Date Additional Medicare IM given:    Discharge Disposition:    Per UR Regulation:    If discussed at Long Length of Stay Meetings, dates discussed:    Comments:  07/01/12 Rosemary Holms RN BSN CM

## 2012-07-01 NOTE — Consult Note (Signed)
Reason for Consult: Nausea and abdominal pain Referring Physician: [facility]  [name] is an 67 y.o. female.  HPI: Patient presented to [facility] with several days of increasing nausea and diffuse abdominal pain. Patient denies any similar symptomatology in the past. Pain started relatively acutely over the weekend and has continued to progress. She describes it as starting in her lower abdomen radiating up to her epigastric and subcostal regions. She denies any pain in one area over another was at all being equally tender. She states the pain is constant but somewhat colicky in nature. Pain does exacerbate with oral intake. No significant relieving features. She has had associated nausea with apparently no episodes of emesis. She also states she's not had a bowel movement for several days. She hasn't attempted to take laxatives and stool softeners as an outpatient with no improvement. She did state she had been straining and noted some dark black" sticky" bowel movement a couple days ago. No similar episodes since. Stated the bowel movement was very smelly. She has had previous colonoscopy a year ago with no evidence of any polyps or abnormalities at that time. She was told her next colonoscopy can be scheduled in 10 years. No history of gastritis or peptic ulcer disease. Patient is aware of some renal disease but is unaware of any worrisome pathologic findings. She denies any jaundice. No significant family history for biliary disease. No change of fatty greasy foods. No bloating.  Past Medical History  Diagnosis Date  . Hypertension   . Diabetes mellitus     non insulin  . Arthritis   . History of blood clots   . Homocysteinemia 11/29/2010  . History of gout     right ring finger  . Left knee pain   . Kidney atrophy     rt.    Past Surgical History  Procedure Laterality Date  . Abdominal hysterectomy    . Knee surgery      rt. arthroscopic  . Tonsillectomy    .  Hypertension    . Type ll diabetes    . Neck surgery      Family History  Problem Relation Age of Onset  . Stroke Mother   . Cancer Father   . Diabetes Maternal Aunt   . Diabetes Maternal Uncle   . Diabetes Maternal Grandmother     Social History:  reports that she has been smoking Cigarettes.  She has a 25 pack-year smoking history. She has never used smokeless tobacco. She reports that  drinks alcohol. She reports that she does not use illicit drugs.  Allergies:  Allergies  Allergen Reactions  . Ace Inhibitors Swelling  . Celecoxib Other (See Comments)    REACTION: Kidney issue  . Hydrocodone-Acetaminophen Itching    BRAND NAME: VICODIN    Medications:  I have reviewed the patient's current medications. Prior to Admission:  Prescriptions prior to admission  Medication Sig Dispense Refill  . acetaminophen (TYLENOL) 500 MG tablet Take 500 mg by mouth every 6 (six) hours as needed for pain. Takes 2 as needed for discomfort      . Ascorbic Acid (VITAMIN C) 1000 MG tablet Take 1,000 mg by mouth daily.      Marland Kitchen aspirin 81 MG tablet Take 81 mg by mouth every evening.       Marland Kitchen b complex vitamins tablet Take 1 tablet by mouth daily. Takes 2 in the morning.      . Cholecalciferol (VITAMIN D3) 1000  UNITS CAPS Take 1 tablet by mouth daily.        Marland Kitchen dexlansoprazole (DEXILANT) 60 MG capsule Take 60 mg by mouth every evening.       . fish oil-omega-3 fatty acids 1000 MG capsule Take 1 g by mouth daily.       . Flaxseed, Linseed, 1000 MG CAPS Take 1 capsule by mouth daily.      . folic acid (FOLVITE) 1 MG tablet Take 1 tablet (1 mg total) by mouth daily.  60 tablet  3  . glimepiride (AMARYL) 2 MG tablet Take 2 mg by mouth 2 (two) times daily.      Marland Kitchen lidocaine (LIDODERM) 5 % Place 1 patch onto the skin daily. Remove & Discard patch within 12 hours or as directed by MD      . linagliptin (TRADJENTA) 5 MG TABS tablet Take 5 mg by mouth daily.        Marland Kitchen NIFEdipine (PROCARDIA XL/ADALAT-CC) 60 MG  24 hr tablet Take 60 mg by mouth daily.        . ondansetron (ZOFRAN ODT) 4 MG disintegrating tablet Take 1 tablet (4 mg total) by mouth every 8 (eight) hours as needed for nausea.  20 tablet  0  . oxyCODONE-acetaminophen (PERCOCET/ROXICET) 5-325 MG per tablet Take 1-2 tablets by mouth every 4 (four) hours as needed for pain.  10 tablet  0  . pioglitazone (ACTOS) 30 MG tablet Take 30 mg by mouth daily.        . promethazine (PHENERGAN) 25 MG tablet Take 0.5 tablets (12.5 mg total) by mouth every 8 (eight) hours as needed for nausea.  20 tablet  0  . PROVENTIL HFA 108 (90 BASE) MCG/ACT inhaler Inhale 1 puff into the lungs daily as needed. For shortness of breath/wheezing      . simvastatin (ZOCOR) 20 MG tablet Take 20 mg by mouth At bedtime.      . sucralfate (CARAFATE) 1 G tablet Take 1 tablet (1 g total) by mouth 4 (four) times daily.  30 tablet  0  . zolpidem (AMBIEN) 10 MG tablet Take 10 mg by mouth at bedtime as needed for sleep.       Scheduled: . ciprofloxacin  400 mg Intravenous Q12H  . insulin aspart  0-15 Units Subcutaneous Q4H  . nicotine  14 mg Transdermal Daily  . pantoprazole (PROTONIX) IV  40 mg Intravenous Q12H   Continuous: . sodium chloride 75 mL/hr at 07/01/12 1058   UJW:[id number], acetaminophen, albuterol, hydrALAZINE, HYDROmorphone (DILAUDID) injection, ondansetron (ZOFRAN) IV, ondansetron  Results for orders placed during the hospital encounter of 06/30/12 (from the past 48 hour(s))  CBC WITH DIFFERENTIAL     Status: Abnormal   Collection Time    06/30/12 10:04 PM      Result Value Range   WBC 7.4  4.0 - 10.5 K/uL   RBC 4.62  3.87 - 5.11 MIL/uL   Hemoglobin 13.6  12.0 - 15.0 g/dL   HCT 30.8  65.7 - 84.6 %   MCV 87.2  78.0 - 100.0 fL   MCH 29.4  26.0 - 34.0 pg   MCHC 33.7  30.0 - 36.0 g/dL   RDW 96.2  95.2 - 84.1 %   Platelets 251  150 - 400 K/uL   Neutrophils Relative 81 (*) 43 - 77 %   Neutro Abs 6.0  1.7 - 7.7 K/uL   Lymphocytes Relative 14  12 - 46 %    Lymphs Abs 1.0  0.7 - 4.0 K/uL   Monocytes Relative 5  3 - 12 %   Monocytes Absolute 0.4  0.1 - 1.0 K/uL   Eosinophils Relative 0  0 - 5 %   Eosinophils Absolute 0.0  0.0 - 0.7 K/uL   Basophils Relative 0  0 - 1 %   Basophils Absolute 0.0  0.0 - 0.1 K/uL  COMPREHENSIVE METABOLIC PANEL     Status: Abnormal   Collection Time    06/30/12 10:04 PM      Result Value Range   Sodium 133 (*) 135 - 145 mEq/L   Potassium 3.5  3.5 - 5.1 mEq/L   Chloride 91 (*) 96 - 112 mEq/L   CO2 27  19 - 32 mEq/L   Glucose, Bld 218 (*) 70 - 99 mg/dL   BUN 13  6 - 23 mg/dL   Creatinine, Ser 1.61 (*) 0.50 - 1.10 mg/dL   Calcium 09.6  8.4 - 04.5 mg/dL   Total Protein 8.2  6.0 - 8.3 g/dL   Albumin 4.0  3.5 - 5.2 g/dL   AST 16  0 - 37 U/L   ALT 12  0 - 35 U/L   Alkaline Phosphatase 106  39 - 117 U/L   Total Bilirubin 0.3  0.3 - 1.2 mg/dL   GFR calc non Af Amer 40 (*) >90 mL/min   GFR calc Af Amer 46 (*) >90 mL/min   Comment:            The eGFR has been calculated     using the CKD EPI equation.     This calculation has not been     validated in all clinical     situations.     eGFR's persistently     <90 mL/min signify     possible Chronic Kidney Disease.  LIPASE, BLOOD     Status: Abnormal   Collection Time    06/30/12 10:04 PM      Result Value Range   Lipase 65 (*) 11 - 59 U/L  TROPONIN I     Status: None   Collection Time    06/30/12 10:04 PM      Result Value Range   Troponin I <0.30  <0.30 ng/mL   Comment:            Due to the release kinetics of cTnI,     a negative result within the first hours     of the onset of symptoms does not rule out     myocardial infarction with certainty.     If myocardial infarction is still suspected,     repeat the test at appropriate intervals.  LACTIC ACID, PLASMA     Status: None   Collection Time    06/30/12 10:04 PM      Result Value Range   Lactic Acid, Venous 1.3  0.5 - 2.2 mmol/L  URINALYSIS, ROUTINE W REFLEX MICROSCOPIC     Status:  Abnormal   Collection Time    06/30/12 11:37 PM      Result Value Range   Color, Urine YELLOW  YELLOW   APPearance CLEAR  CLEAR   Specific Gravity, Urine 1.020  1.005 - 1.030   pH 6.5  5.0 - 8.0   Glucose, UA 100 (*) NEGATIVE mg/dL   Hgb urine dipstick TRACE (*) NEGATIVE   Bilirubin Urine NEGATIVE  NEGATIVE   Ketones, ur NEGATIVE  NEGATIVE mg/dL   Protein, ur 30 (*) NEGATIVE mg/dL  Urobilinogen, UA 0.2  0.0 - 1.0 mg/dL   Nitrite NEGATIVE  NEGATIVE   Leukocytes, UA NEGATIVE  NEGATIVE  URINE MICROSCOPIC-ADD ON     Status: None   Collection Time    06/30/12 11:37 PM      Result Value Range   Squamous Epithelial / LPF RARE  RARE   RBC / HPF 0-2  <3 RBC/hpf  HEMOGLOBIN A1C     Status: Abnormal   Collection Time    07/01/12  1:30 AM      Result Value Range   Hemoglobin A1C 6.8 (*) <5.7 %   Comment: (NOTE)                                                                               According to the ADA Clinical Practice Recommendations for 2011, when     HbA1c is used as a screening test:      >=6.5%   Diagnostic of Diabetes Mellitus               (if abnormal result is confirmed)     5.7-6.4%   Increased risk of developing Diabetes Mellitus     References:Diagnosis and Classification of Diabetes Mellitus,Diabetes     Care,2011,34(Suppl 1):S62-S69 and Standards of Medical Care in             Diabetes - 2011,Diabetes Care,2011,34 (Suppl 1):S11-S61.   Mean Plasma Glucose 148 (*) <117 mg/dL  GLUCOSE, CAPILLARY     Status: Abnormal   Collection Time    07/01/12  4:10 AM      Result Value Range   Glucose-Capillary 154 (*) 70 - 99 mg/dL  LIPASE, BLOOD     Status: None   Collection Time    07/01/12  5:24 AM      Result Value Range   Lipase 54  11 - 59 U/L  COMPREHENSIVE METABOLIC PANEL     Status: Abnormal   Collection Time    07/01/12  5:24 AM      Result Value Range   Sodium 134 (*) 135 - 145 mEq/L   Potassium 3.0 (*) 3.5 - 5.1 mEq/L   Chloride 97  96 - 112 mEq/L   CO2 27   19 - 32 mEq/L   Glucose, Bld 129 (*) 70 - 99 mg/dL   BUN 12  6 - 23 mg/dL   Creatinine, Ser 1.61 (*) 0.50 - 1.10 mg/dL   Calcium 9.6  8.4 - 09.6 mg/dL   Total Protein 7.3  6.0 - 8.3 g/dL   Albumin 3.5  3.5 - 5.2 g/dL   AST 13  0 - 37 U/L   ALT 10  0 - 35 U/L   Alkaline Phosphatase 92  39 - 117 U/L   Total Bilirubin 0.3  0.3 - 1.2 mg/dL   GFR calc non Af Amer 43 (*) >90 mL/min   GFR calc Af Amer 50 (*) >90 mL/min   Comment:            The eGFR has been calculated     using the CKD EPI equation.     This calculation has not been     validated in all clinical  situations.     eGFR's persistently     <90 mL/min signify     possible Chronic Kidney Disease.  CBC     Status: None   Collection Time    07/01/12  5:24 AM      Result Value Range   WBC 7.7  4.0 - 10.5 K/uL   RBC 4.36  3.87 - 5.11 MIL/uL   Hemoglobin 12.7  12.0 - 15.0 g/dL   HCT 16.1  09.6 - 04.5 %   MCV 87.6  78.0 - 100.0 fL   MCH 29.1  26.0 - 34.0 pg   MCHC 33.2  30.0 - 36.0 g/dL   RDW 40.9  81.1 - 91.4 %   Platelets 228  150 - 400 K/uL  PROTIME-INR     Status: None   Collection Time    07/01/12  5:24 AM      Result Value Range   Prothrombin Time 13.7  11.6 - 15.2 seconds   INR 1.06  0.00 - 1.49  APTT     Status: None   Collection Time    07/01/12  5:24 AM      Result Value Range   aPTT 26  24 - 37 seconds  GLUCOSE, CAPILLARY     Status: Abnormal   Collection Time    07/01/12  7:39 AM      Result Value Range   Glucose-Capillary 107 (*) 70 - 99 mg/dL   Comment 1 Notify RN    GLUCOSE, CAPILLARY     Status: Abnormal   Collection Time    07/01/12 11:39 AM      Result Value Range   Glucose-Capillary 100 (*) 70 - 99 mg/dL   Comment 1 Notify RN      Dg Chest 2 View  06/29/2012  *RADIOLOGY REPORT*  Clinical Data: Chest pain  CHEST - 2 VIEW  Comparison: 06/28/2012  Findings: Mild cardiomegaly. Mild patchy density at the posterior left lung base.  No pneumothorax or pleural effusion.  Increased AP diameter  chest and bronchitic changes are noted.  Stable thoracic spine.  IMPRESSION: Cardiomegaly without decompensation.  Mild patchy atelectasis verses airspace disease at the posterior left lung base.  Changes related to COPD.   Original Report Authenticated By: Jolaine Click, M.D.    Ct Abdomen Pelvis W Contrast  06/29/2012  *RADIOLOGY REPORT*  Clinical Data: Lower abdominal pain and emesis.  CT ABDOMEN AND PELVIS WITH CONTRAST  Technique:  Multidetector CT imaging of the abdomen and pelvis was performed following the standard protocol during bolus administration of intravenous contrast.  Contrast: 50mL OMNIPAQUE IOHEXOL 300 MG/ML  SOLN, OMNIPAQUE IOHEXOL 300 MG/ML  SOLN  Comparison: 11/17/2010  Findings: Diffuse hepatic steatosis.  Small gallstones are suspected, best viewed on sagittal reconstruction imaging.  Lesion in the lower pole of the left kidney is less distinct and smaller on image 37.  On image 29, the 2.5 cm enhancing lesion in the right kidney has enlarged worrisome for renal cell carcinoma.  Other hypodensities in both kidneys are stable.  Severe atrophy of the right kidney is again noted.  There is extensive stranding surrounding the right kidney.  No definite ureteral calculus.  Stable left adrenal nodule.  Right adrenal gland and pancreas are within normal limits.  Normal appendix.  Sigmoid diverticulosis without evidence of acute diverticulitis.  Advanced facet arthropathy with spinal stenosis at in the lower lumbar spine.  No vertebral compression deformity.  No evidence of free fluid.  Unremarkable bladder.  IMPRESSION: Inflammatory changes of the right kidney are suspected.  Cholelithiasis is suspected and cannot be confirmed with sonography.  Lesion in the right kidney is larger worrisome for renal cell carcinoma.  This was incompletely characterized on a previous MRI. Repeat MR may be helpful when the patient can hold her breath.   Original Report Authenticated By: Jolaine Click, M.D.     Dg Abd Acute W/chest  06/30/2012  *RADIOLOGY REPORT*  Clinical Data: Nominal pain  ACUTE ABDOMEN SERIES (ABDOMEN 2 VIEW & CHEST 1 VIEW)  Comparison: 06/29/2012  Findings: Mild cardiomegaly.  Subsegmental atelectasis at the left base.  Upper lungs clear.  There is no free intraperitoneal gas. Nonspecific air fluid levels in the colon.  No disproportionate dilatation of bowel.  IMPRESSION: Nonobstructive bowel gas pattern.  Subsegmental atelectasis at the left base.   Original Report Authenticated By: Jolaine Click, M.D.     Review of Systems  Constitutional: Positive for chills.  HENT: Negative.   Eyes: Negative.   Respiratory: Negative.   Cardiovascular: Negative.   Gastrointestinal: Positive for heartburn, nausea, abdominal pain (diffuse), constipation and melena (questionable). Negative for vomiting and diarrhea.  Genitourinary: Negative.   Musculoskeletal: Negative.   Skin: Negative.   Neurological: Positive for weakness.  Endo/Heme/Allergies: Negative.   Psychiatric/Behavioral: Negative.    Blood pressure 131/69, pulse 81, temperature 98.9 F (37.2 C), temperature source Oral, resp. rate 18, height 5\' 10"  (1.778 m), weight 100.336 kg (221 lb 3.2 oz), SpO2 93.00%. Physical Exam  Constitutional: She is oriented to person, place, and time. She appears well-developed and well-nourished. No distress.  Obese  HENT:  Head: Normocephalic and atraumatic.  Eyes: Conjunctivae and EOM are normal. Pupils are equal, round, and reactive to light. No scleral icterus.  Neck: Normal range of motion. Neck supple. No thyromegaly present.  Cardiovascular: Normal rate, regular rhythm and normal heart sounds.   Respiratory: Effort normal and breath sounds normal. No respiratory distress.  GI: Soft. She exhibits no distension and no mass. There is tenderness (moderate diffuse tenderness. Some lower dominant all as well as epigastric and right upper quadrant abdominal tenderness. No significant rebound  tenderness. No referred pain. No classic Murphy sign.). There is no rebound and no guarding.  Lymphadenopathy:    She has no cervical adenopathy.  Neurological: She is alert and oriented to person, place, and time.  Skin: Skin is warm and dry.    Assessment/Plan: Nausea vomiting and abdominal pain. I had a long discussion with the patient and her family regarding the current workup. I do feel that a ultrasound may be beneficial to demonstrate evidence of any acute inflammatory changes associated with the gallbladder. A previous CT evaluation of the abdomen and pelvis dated suggest stones within the gallbladder. Despite the presence of stones however I cannot completely rule in the gallbladder as the causative etiology for her symptomatology. I have discussed other possibilities and diagnoses with the patient. Given her surgical history there is also a chance that this may represent a partial or early small bowel obstruction given her decreasing bowel function and diffuse nature of her pain. Regardless of the causative etiology I would minimize oral intake and have recommended to the patient limiting by mouth intake suggest water and ice chips. Continue IV fluid hydration. Her description of her last bowel movement is somewhat concerning for melena and pending results from the ultrasound may require additional workup prior to any consideration for surgical intervention. Upper endoscopy may be beneficial rule out any upper GI  source of blood and therefore pain. Furthermore the changes noted with the patient's renal nodule may also need further workup prior to any consideration for definitive surgical intervention for cholelithiasis.  I will continue to follow the patient however this time he did feel conservative management is important.  Amyia Lodwick C 07/01/2012, 2:21 PM

## 2012-07-02 ENCOUNTER — Encounter (HOSPITAL_COMMUNITY): Payer: Self-pay | Admitting: *Deleted

## 2012-07-02 ENCOUNTER — Encounter (HOSPITAL_COMMUNITY)
Admission: EM | Disposition: A | Discharge status: Home / Self Care | Payer: Self-pay | Source: Home / Self Care | Attending: Family Medicine

## 2012-07-02 ENCOUNTER — Telehealth (HOSPITAL_COMMUNITY): Payer: Self-pay

## 2012-07-02 DIAGNOSIS — R1013 Epigastric pain: Secondary | ICD-10-CM

## 2012-07-02 DIAGNOSIS — R1115 Cyclical vomiting syndrome unrelated to migraine: Secondary | ICD-10-CM

## 2012-07-02 DIAGNOSIS — R195 Other fecal abnormalities: Secondary | ICD-10-CM

## 2012-07-02 DIAGNOSIS — R110 Nausea: Secondary | ICD-10-CM

## 2012-07-02 HISTORY — PX: ESOPHAGOGASTRODUODENOSCOPY: SHX5428

## 2012-07-02 LAB — GLUCOSE, CAPILLARY
Glucose-Capillary: 53 mg/dL — ABNORMAL LOW (ref 70–99)
Glucose-Capillary: 147 mg/dL — ABNORMAL HIGH (ref 70–99)
Glucose-Capillary: 108 mg/dL — ABNORMAL HIGH (ref 70–99)
Glucose-Capillary: 180 mg/dL — ABNORMAL HIGH (ref 70–99)

## 2012-07-02 LAB — CBC
MCH: 29.1 pg (ref 26.0–34.0)
MCV: 88.6 fL (ref 78.0–100.0)
Platelets: 203 10*3/uL (ref 150–400)
RDW: 15.1 % (ref 11.5–15.5)
WBC: 9.1 10*3/uL (ref 4.0–10.5)

## 2012-07-02 LAB — COMPREHENSIVE METABOLIC PANEL
AST: 13 U/L (ref 0–37)
Albumin: 3.3 g/dL — ABNORMAL LOW (ref 3.5–5.2)
Calcium: 9.5 mg/dL (ref 8.4–10.5)
Chloride: 101 mEq/L (ref 96–112)
Creatinine, Ser: 1.37 mg/dL — ABNORMAL HIGH (ref 0.50–1.10)

## 2012-07-02 SURGERY — EGD (ESOPHAGOGASTRODUODENOSCOPY)
Anesthesia: Moderate Sedation

## 2012-07-02 MED ORDER — MEPERIDINE HCL 25 MG/ML IJ SOLN
INTRAMUSCULAR | Status: DC | PRN
  Administered 2012-07-02 (×2): 25 mg via INTRAVENOUS

## 2012-07-02 MED ORDER — BUTAMBEN-TETRACAINE-BENZOCAINE 2-2-14 % EX AERO
INHALATION_SPRAY | CUTANEOUS | Status: DC | PRN
  Administered 2012-07-02: 2 via TOPICAL

## 2012-07-02 MED ORDER — POLYETHYLENE GLYCOL 3350 17 G PO PACK
17.0000 g | PACK | Freq: Every day | ORAL | Status: DC
  Administered 2012-07-02 – 2012-07-04 (×3): 17 g via ORAL
  Filled 2012-07-02 (×3): qty 1

## 2012-07-02 MED ORDER — MIDAZOLAM HCL 5 MG/5ML IJ SOLN
INTRAMUSCULAR | Status: DC | PRN
  Administered 2012-07-02 (×3): 2 mg via INTRAVENOUS

## 2012-07-02 MED ORDER — MEPERIDINE HCL 50 MG/ML IJ SOLN
INTRAMUSCULAR | Status: AC
  Filled 2012-07-02: qty 1

## 2012-07-02 MED ORDER — INSULIN ASPART 100 UNIT/ML ~~LOC~~ SOLN
0.0000 [IU] | Freq: Three times a day (TID) | SUBCUTANEOUS | Status: DC
  Administered 2012-07-03 (×2): 2 [IU] via SUBCUTANEOUS
  Administered 2012-07-03: 3 [IU] via SUBCUTANEOUS
  Administered 2012-07-04 (×2): 2 [IU] via SUBCUTANEOUS

## 2012-07-02 MED ORDER — SODIUM CHLORIDE 0.9 % IV SOLN: INTRAVENOUS | Status: DC

## 2012-07-02 MED ORDER — TRAZODONE HCL 50 MG PO TABS
25.0000 mg | ORAL_TABLET | Freq: Every evening | ORAL | Status: DC | PRN
  Administered 2012-07-02: 25 mg via ORAL
  Filled 2012-07-02: qty 1

## 2012-07-02 MED ORDER — PANTOPRAZOLE SODIUM 40 MG PO TBEC
40.0000 mg | DELAYED_RELEASE_TABLET | Freq: Every day | ORAL | Status: DC
  Administered 2012-07-02 – 2012-07-04 (×3): 40 mg via ORAL
  Filled 2012-07-02 (×3): qty 1

## 2012-07-02 MED ORDER — STERILE WATER FOR IRRIGATION IR SOLN
Status: DC | PRN
  Administered 2012-07-02: 16:00:00

## 2012-07-02 MED ORDER — MIDAZOLAM HCL 5 MG/5ML IJ SOLN
INTRAMUSCULAR | Status: AC
  Filled 2012-07-02: qty 10

## 2012-07-02 NOTE — Consult Note (Signed)
Reason for Consult:epigastric pain, nausea Referring Physician: Hospitalist  [name] is an 67 y.o. female.  HPI: Admitted thru the ED Monday with epigastric pain. She tells me she has been seen in the ED x 3 . The third time she insisted on being admitted.  She tells me she has mid abdominal pain radiating up into her chest. She has nausea constantly. She tells me when she eats solid foods, she will have abdominal pain and nausea. Her symptoms x 5 days. Hx GERD and has been taking Dexilant. In the past Dexilant has helped, but now is not helping.  She tells me her stools are black now. She has been taking Pepto Bisomol x 2 doses. Her last dose of Pepto Bismol was Saturday.  She does not feel any better. Lipase slightly elevated. She takes an 81mg  ASA daily.  She occasionally takes Tylenol.  Her last colonoscopy was 1 yr by Dr Lovell Sheehan which was normal. Hx of diabetes x 15 yrs. No weight loss. Up until Friday, her appetite was good. She tells me her stool was black today. Stool soft and loose this am.  Past Medical History  Diagnosis Date  . Hypertension   . Diabetes mellitus     non insulin  . Arthritis   . History of blood clots   . Homocysteinemia 11/29/2010  . History of gout     right ring finger  . Left knee pain   . Kidney atrophy     rt.    Past Surgical History  Procedure Laterality Date  . Abdominal hysterectomy    . Knee surgery      rt. arthroscopic  . Tonsillectomy    . Hypertension    . Type ll diabetes    . Neck surgery      Family History  Problem Relation Age of Onset  . Stroke Mother   . Cancer Father   . Diabetes Maternal Aunt   . Diabetes Maternal Uncle   . Diabetes Maternal Grandmother     Social History:  reports that she has been smoking Cigarettes.  She has a 25 pack-year smoking history. She has never used smokeless tobacco. She reports that  drinks alcohol. She reports that she does not use illicit drugs.  Allergies:  Allergies   Allergen Reactions  . Ace Inhibitors Swelling  . Celecoxib Other (See Comments)    REACTION: Kidney issue  . Hydrocodone-Acetaminophen Itching    BRAND NAME: VICODIN    Medications: I have reviewed the patient's current medications.  Results for orders placed during the hospital encounter of 06/30/12 (from the past 48 hour(s))  CBC WITH DIFFERENTIAL     Status: Abnormal   Collection Time    06/30/12 10:04 PM      Result Value Range   WBC 7.4  4.0 - 10.5 K/uL   RBC 4.62  3.87 - 5.11 MIL/uL   Hemoglobin 13.6  12.0 - 15.0 g/dL   HCT 16.1  09.6 - 04.5 %   MCV 87.2  78.0 - 100.0 fL   MCH 29.4  26.0 - 34.0 pg   MCHC 33.7  30.0 - 36.0 g/dL   RDW 40.9  81.1 - 91.4 %   Platelets 251  150 - 400 K/uL   Neutrophils Relative 81 (*) 43 - 77 %   Neutro Abs 6.0  1.7 - 7.7 K/uL   Lymphocytes Relative 14  12 - 46 %   Lymphs Abs 1.0  0.7 - 4.0 K/uL  Monocytes Relative 5  3 - 12 %   Monocytes Absolute 0.4  0.1 - 1.0 K/uL   Eosinophils Relative 0  0 - 5 %   Eosinophils Absolute 0.0  0.0 - 0.7 K/uL   Basophils Relative 0  0 - 1 %   Basophils Absolute 0.0  0.0 - 0.1 K/uL  COMPREHENSIVE METABOLIC PANEL     Status: Abnormal   Collection Time    06/30/12 10:04 PM      Result Value Range   Sodium 133 (*) 135 - 145 mEq/L   Potassium 3.5  3.5 - 5.1 mEq/L   Chloride 91 (*) 96 - 112 mEq/L   CO2 27  19 - 32 mEq/L   Glucose, Bld 218 (*) 70 - 99 mg/dL   BUN 13  6 - 23 mg/dL   Creatinine, Ser 1.61 (*) 0.50 - 1.10 mg/dL   Calcium 09.6  8.4 - 04.5 mg/dL   Total Protein 8.2  6.0 - 8.3 g/dL   Albumin 4.0  3.5 - 5.2 g/dL   AST 16  0 - 37 U/L   ALT 12  0 - 35 U/L   Alkaline Phosphatase 106  39 - 117 U/L   Total Bilirubin 0.3  0.3 - 1.2 mg/dL   GFR calc non Af Amer 40 (*) >90 mL/min   GFR calc Af Amer 46 (*) >90 mL/min   Comment:            The eGFR has been calculated     using the CKD EPI equation.     This calculation has not been     validated in all clinical     situations.     eGFR's  persistently     <90 mL/min signify     possible Chronic Kidney Disease.  LIPASE, BLOOD     Status: Abnormal   Collection Time    06/30/12 10:04 PM      Result Value Range   Lipase 65 (*) 11 - 59 U/L  TROPONIN I     Status: None   Collection Time    06/30/12 10:04 PM      Result Value Range   Troponin I <0.30  <0.30 ng/mL   Comment:            Due to the release kinetics of cTnI,     a negative result within the first hours     of the onset of symptoms does not rule out     myocardial infarction with certainty.     If myocardial infarction is still suspected,     repeat the test at appropriate intervals.  LACTIC ACID, PLASMA     Status: None   Collection Time    06/30/12 10:04 PM      Result Value Range   Lactic Acid, Venous 1.3  0.5 - 2.2 mmol/L  URINALYSIS, ROUTINE W REFLEX MICROSCOPIC     Status: Abnormal   Collection Time    06/30/12 11:37 PM      Result Value Range   Color, Urine YELLOW  YELLOW   APPearance CLEAR  CLEAR   Specific Gravity, Urine 1.020  1.005 - 1.030   pH 6.5  5.0 - 8.0   Glucose, UA 100 (*) NEGATIVE mg/dL   Hgb urine dipstick TRACE (*) NEGATIVE   Bilirubin Urine NEGATIVE  NEGATIVE   Ketones, ur NEGATIVE  NEGATIVE mg/dL   Protein, ur 30 (*) NEGATIVE mg/dL   Urobilinogen, UA 0.2  0.0 -  1.0 mg/dL   Nitrite NEGATIVE  NEGATIVE   Leukocytes, UA NEGATIVE  NEGATIVE  URINE CULTURE     Status: None   Collection Time    06/30/12 11:37 PM      Result Value Range   Specimen Description URINE, CATHETERIZED     Special Requests NONE     Culture  Setup Time 06/30/2012 23:30     Colony Count NO GROWTH     Culture NO GROWTH     Report Status 07/01/2012 FINAL    URINE MICROSCOPIC-ADD ON     Status: None   Collection Time    06/30/12 11:37 PM      Result Value Range   Squamous Epithelial / LPF RARE  RARE   RBC / HPF 0-2  <3 RBC/hpf  HEMOGLOBIN A1C     Status: Abnormal   Collection Time    07/01/12  1:30 AM      Result Value Range   Hemoglobin A1C 6.8 (*)  <5.7 %   Comment: (NOTE)                                                                               According to the ADA Clinical Practice Recommendations for 2011, when     HbA1c is used as a screening test:      >=6.5%   Diagnostic of Diabetes Mellitus               (if abnormal result is confirmed)     5.7-6.4%   Increased risk of developing Diabetes Mellitus     References:Diagnosis and Classification of Diabetes Mellitus,Diabetes     Care,2011,34(Suppl 1):S62-S69 and Standards of Medical Care in             Diabetes - 2011,Diabetes Care,2011,34 (Suppl 1):S11-S61.   Mean Plasma Glucose 148 (*) <117 mg/dL  GLUCOSE, CAPILLARY     Status: Abnormal   Collection Time    07/01/12  4:10 AM      Result Value Range   Glucose-Capillary 154 (*) 70 - 99 mg/dL  LIPASE, BLOOD     Status: None   Collection Time    07/01/12  5:24 AM      Result Value Range   Lipase 54  11 - 59 U/L  COMPREHENSIVE METABOLIC PANEL     Status: Abnormal   Collection Time    07/01/12  5:24 AM      Result Value Range   Sodium 134 (*) 135 - 145 mEq/L   Potassium 3.0 (*) 3.5 - 5.1 mEq/L   Chloride 97  96 - 112 mEq/L   CO2 27  19 - 32 mEq/L   Glucose, Bld 129 (*) 70 - 99 mg/dL   BUN 12  6 - 23 mg/dL   Creatinine, Ser 1.61 (*) 0.50 - 1.10 mg/dL   Calcium 9.6  8.4 - 09.6 mg/dL   Total Protein 7.3  6.0 - 8.3 g/dL   Albumin 3.5  3.5 - 5.2 g/dL   AST 13  0 - 37 U/L   ALT 10  0 - 35 U/L   Alkaline Phosphatase 92  39 - 117 U/L   Total Bilirubin 0.3  0.3 -  1.2 mg/dL   GFR calc non Af Amer 43 (*) >90 mL/min   GFR calc Af Amer 50 (*) >90 mL/min   Comment:            The eGFR has been calculated     using the CKD EPI equation.     This calculation has not been     validated in all clinical     situations.     eGFR's persistently     <90 mL/min signify     possible Chronic Kidney Disease.  CBC     Status: None   Collection Time    07/01/12  5:24 AM      Result Value Range   WBC 7.7  4.0 - 10.5 K/uL   RBC  4.36  3.87 - 5.11 MIL/uL   Hemoglobin 12.7  12.0 - 15.0 g/dL   HCT 78.4  69.6 - 29.5 %   MCV 87.6  78.0 - 100.0 fL   MCH 29.1  26.0 - 34.0 pg   MCHC 33.2  30.0 - 36.0 g/dL   RDW 28.4  13.2 - 44.0 %   Platelets 228  150 - 400 K/uL  PROTIME-INR     Status: None   Collection Time    07/01/12  5:24 AM      Result Value Range   Prothrombin Time 13.7  11.6 - 15.2 seconds   INR 1.06  0.00 - 1.49  APTT     Status: None   Collection Time    07/01/12  5:24 AM      Result Value Range   aPTT 26  24 - 37 seconds  GLUCOSE, CAPILLARY     Status: Abnormal   Collection Time    07/01/12  7:39 AM      Result Value Range   Glucose-Capillary 107 (*) 70 - 99 mg/dL   Comment 1 Notify RN    GLUCOSE, CAPILLARY     Status: Abnormal   Collection Time    07/01/12 11:39 AM      Result Value Range   Glucose-Capillary 100 (*) 70 - 99 mg/dL   Comment 1 Notify RN    GLUCOSE, CAPILLARY     Status: None   Collection Time    07/01/12  5:21 PM      Result Value Range   Glucose-Capillary 80  70 - 99 mg/dL   Comment 1 Notify RN     Comment 2 Documented in Chart    GLUCOSE, CAPILLARY     Status: Abnormal   Collection Time    07/01/12  8:13 PM      Result Value Range   Glucose-Capillary 209 (*) 70 - 99 mg/dL   Comment 1 Notify RN    GLUCOSE, CAPILLARY     Status: Abnormal   Collection Time    07/02/12 12:40 AM      Result Value Range   Glucose-Capillary 53 (*) 70 - 99 mg/dL   Comment 1 Notify RN    GLUCOSE, CAPILLARY     Status: None   Collection Time    07/02/12  1:00 AM      Result Value Range   Glucose-Capillary 72  70 - 99 mg/dL  GLUCOSE, CAPILLARY     Status: Abnormal   Collection Time    07/02/12  4:15 AM      Result Value Range   Glucose-Capillary 222 (*) 70 - 99 mg/dL   Comment 1 Notify RN    CBC  Status: None   Collection Time    07/02/12  4:55 AM      Result Value Range   WBC 9.1  4.0 - 10.5 K/uL   RBC 4.74  3.87 - 5.11 MIL/uL   Hemoglobin 13.8  12.0 - 15.0 g/dL   HCT 11.9   14.7 - 82.9 %   MCV 88.6  78.0 - 100.0 fL   MCH 29.1  26.0 - 34.0 pg   MCHC 32.9  30.0 - 36.0 g/dL   RDW 56.2  13.0 - 86.5 %   Platelets 203  150 - 400 K/uL  COMPREHENSIVE METABOLIC PANEL     Status: Abnormal   Collection Time    07/02/12  4:55 AM      Result Value Range   Sodium 136  135 - 145 mEq/L   Potassium 3.6  3.5 - 5.1 mEq/L   Chloride 101  96 - 112 mEq/L   CO2 26  19 - 32 mEq/L   Glucose, Bld 165 (*) 70 - 99 mg/dL   BUN 11  6 - 23 mg/dL   Creatinine, Ser 7.84 (*) 0.50 - 1.10 mg/dL   Calcium 9.5  8.4 - 69.6 mg/dL   Total Protein 6.9  6.0 - 8.3 g/dL   Albumin 3.3 (*) 3.5 - 5.2 g/dL   AST 13  0 - 37 U/L   ALT 10  0 - 35 U/L   Alkaline Phosphatase 92  39 - 117 U/L   Total Bilirubin 0.2 (*) 0.3 - 1.2 mg/dL   GFR calc non Af Amer 39 (*) >90 mL/min   GFR calc Af Amer 45 (*) >90 mL/min   Comment:            The eGFR has been calculated     using the CKD EPI equation.     This calculation has not been     validated in all clinical     situations.     eGFR's persistently     <90 mL/min signify     possible Chronic Kidney Disease.    Dg Abd Acute W/chest  06/30/2012  *RADIOLOGY REPORT*  Clinical Data: Nominal pain  ACUTE ABDOMEN SERIES (ABDOMEN 2 VIEW & CHEST 1 VIEW)  Comparison: 06/29/2012  Findings: Mild cardiomegaly.  Subsegmental atelectasis at the left base.  Upper lungs clear.  There is no free intraperitoneal gas. Nonspecific air fluid levels in the colon.  No disproportionate dilatation of bowel.  IMPRESSION: Nonobstructive bowel gas pattern.  Subsegmental atelectasis at the left base.   Original Report Authenticated By: Jolaine Click, M.D.    US Abdomen Limited Ruq  07/01/2012  *RADIOLOGY REPORT*  Clinical Data:  History of nausea and right upper quadrant abdominal pain.  LIMITED ABDOMINAL ULTRASOUND - RIGHT UPPER QUADRANT  Comparison:  CT 06/29/2012.  Findings:  Gallbladder: There is cholelithiasis. Multiple shadowing gallstones are seen within the gallbladder. The  largest calculus has a greatest diameter of 7 mm. No gallbladder wall thickening or pericholecystic fluid. Negative sonographic Murphy's sign according to the ultrasound technologist. Gallbladder wall thickness is 1.9 mm.  CBD: Normal in caliber measuring approximately 5.7 mm. No choledocholithiasis is evident.  Liver:  Normal size and echotexture without focal parenchymal abnormality. Portal vein is patent with hepatopetal flow.  Right Kidney:  No hydronephrosis.  Simple cyst of right kidney. The cyst measures 1.9 ml of point of normal place 6 cm.  IMPRESSION:  Cholelithiasis.  Multiple gallstones are seen in the gallbladder. No gallbladder wall thickening.  No pericholecystic fluid. Negative sonographic Murphy's sign.  No bile duct or hepatic abnormality was identified.  Simple cyst of right kidney.   Original Report Authenticated By: Onalee Hua Call     ROS Blood pressure 132/69, pulse 73, temperature 98.2 F (36.8 C), temperature source Oral, resp. rate 17, height 5\' 10"  (1.778 m), weight 221 lb 3.2 oz (100.336 kg), SpO2 100.00%. Physical Exam Alert and oriented. Skin warm and dry. Oral mucosa is moist.   . Sclera anicteric, conjunctivae is pink. Thyroid not enlarged. No cervical lymphadenopathy. Lungs clear. Heart regular rate and rhythm.  Abdomen is soft. Bowel sounds are positive. No hepatomegaly. No abdominal masses felt.Epigastric tenderness and mid abdominal tenderness.  No edema to lower extremities.    Assessment/Plan:Epigastric and mid abdominal pain. Nausea. Possible melena (Patient did take 2 doses of Pepto Bismol).  PUD needs to be ruled out.  Will discuss with Dr. Karilyn Cota.  SETZER,TERRI W 07/02/2012, 8:24 AM    GI attending note; Patient interviewed and examined. Imaging studies reviewed with Dr. Ulyses Southward. Patient is 67 year old African female with acute symptomatology consisting of intractable nausea and generalized abdominal pain. On workup she was found to have cholelithiasis. Dr.  Leticia Penna had seen the patient and recommended ruling out other conditions before cholecystectomy considered. Patient also has atrophic right kidney with a cyst which has slight increased in diameter but appears benign. Perinephric stranding around right kidney was also present on prior study of August 2012. Abdominal exam reveals normal bowel sounds mild midepigastric and periumblical tenderness. Patient's symptoms are not typical of peptic ulcer disease PUD needs to be ruled out. Will proceed with EGD. Patient is agreeable.

## 2012-07-02 NOTE — Progress Notes (Signed)
[facility] PROGRESS NOTE  [name] JWJ:[id number] DOB: 07-Apr-1945 DOA: 06/30/2012 PCP: Colette Ribas, MD Cardiologist: Dr. Allyson Sabal for possible renal artery stenosis. Urologist: Dr. Cassell Smiles  Assessment/Plan: 1. Abdominal pain: Etiology remains obscure. Appreciate general surgery and gastroenterology evaluations. Imaging has been unrevealing and EGD was normal. 2. Intractable nausea: Etiology unclear. Continue antiemetics. 3. Cholelithiasis: Not clearly contributing. Per general surgery. 4. Diabetes mellitus type 2 with hypoglycemia: Hemoglobin A1c. Decrease sliding scale insulin. 5. Dehydration: IV fluids. 6. Constipation: Bowel regimen. 7. History of PE: No longer on anticoagulation. 8. Perinephric stranding right kidney: Present August 2012. 9. Right kidney mass: Suspected cyst. Has been seen previously. Followup with urology as an outpatient.  Plan: 1. Decrease sliding scale insulin. 2. Repeat labs in the morning. 3. Bowel regimen  Code Status: Full code Family Communication: None present Disposition Plan: Home when improved  Brendia Sacks, MD  [facility]  Pager [phone] If 7PM-7AM, please contact night-coverage at [email or web address], password [facility] 07/02/2012, 4:03 PM  LOS: 2 days   Brief narrative: 67 year old woman present with ongoing abdominal pain. This pain developed in the lower abdomen and then progressed upwards with radiation to her neck and chest. Nausea no vomiting. Complained of black-colored stool, took Pepto-Bismol.  Consultants:  General surgery  Gastroenterology  Procedures:   4/2 upper endoscopy: Negative.  HPI/Subjective: Continues to have nausea. No vomiting. Complains of generalized abdominal pain from lower abdomen radiating up to chest and throat. Remains afebrile. Vitals stable.  Objective: Filed Vitals:   07/01/12 2222 07/02/12 0555 07/02/12 1411 07/02/12 1449  BP: 125/71 132/69 121/68 160/85  Pulse: 82 73 74 76   Temp: 98.4 F (36.9 C) 98.2 F (36.8 C) 98.1 F (36.7 C) 98.3 F (36.8 C)  TempSrc: Oral Oral Oral Oral  Resp: 18 17 18 17   Height:      Weight:      SpO2: 96% 100% 99% 96%    Intake/Output Summary (Last 24 hours) at 07/02/12 1603 Last data filed at 07/02/12 1016  Gross per 24 hour  Intake    840 ml  Output   1800 ml  Net   -960 ml   Filed Weights   06/30/12 2123 07/01/12 0153  Weight: 102.059 kg (225 lb) 100.336 kg (221 lb 3.2 oz)    Exam:  General:  Appears calm and mildly uncomfortable Cardiovascular: RRR, no m/r/g. No LE edema. Respiratory: CTA bilaterally, no w/r/r. Normal respiratory effort. Abdomen: soft, mild generalized tenderness Psychiatric: grossly normal mood and affect, speech fluent and appropriate  Data Reviewed: Basic Metabolic Panel:  Recent Labs Lab 06/28/12 0446 06/30/12 2204 07/01/12 0524 07/02/12 0455  NA 137 133* 134* 136  K 3.3* 3.5 3.0* 3.6  CL 98 91* 97 101  CO2 25 27 27 26   GLUCOSE 238* 218* 129* 165*  BUN 12 13 12 11   CREATININE 0.94 1.33* 1.26* 1.37*  CALCIUM 10.0 10.3 9.6 9.5   Liver Function Tests:  Recent Labs Lab 06/28/12 0446 06/30/12 2204 07/01/12 0524 07/02/12 0455  AST 15 16 13 13   ALT 12 12 10 10   ALKPHOS 106 106 92 92  BILITOT 0.3 0.3 0.3 0.2*  PROT 7.7 8.2 7.3 6.9  ALBUMIN 4.0 4.0 3.5 3.3*    Recent Labs Lab 06/29/12 1820 06/30/12 2204 07/01/12 0524  LIPASE 23 65* 54   CBC:  Recent Labs Lab 06/28/12 0446 06/30/12 2204 07/01/12 0524 07/02/12 0455  WBC 8.1 7.4 7.7 9.1  NEUTROABS 6.4 6.0  --   --  HGB 12.9 13.6 12.7 13.8  HCT 38.8 40.3 38.2 42.0  MCV 88.4 87.2 87.6 88.6  PLT 254 251 228 203   Cardiac Enzymes:  Recent Labs Lab 06/28/12 0446 06/29/12 1820 06/30/12 2204  TROPONINI <0.30 <0.30 <0.30   CBG:  Recent Labs Lab 07/02/12 0100 07/02/12 0415 07/02/12 0828 07/02/12 1206 07/02/12 1451  GLUCAP 72 222* 123* 147* 90    Recent Results (from the past 240 hour(s))  URINE  CULTURE     Status: None   Collection Time    06/30/12 11:37 PM      Result Value Range Status   Specimen Description URINE, CATHETERIZED   Final   Special Requests NONE   Final   Culture  Setup Time 06/30/2012 23:30   Final   Colony Count NO GROWTH   Final   Culture NO GROWTH   Final   Report Status 07/01/2012 FINAL   Final     Studies: Dg Abd Acute W/chest  06/30/2012  *RADIOLOGY REPORT*  Clinical Data: Nominal pain  ACUTE ABDOMEN SERIES (ABDOMEN 2 VIEW & CHEST 1 VIEW)  Comparison: 06/29/2012  Findings: Mild cardiomegaly.  Subsegmental atelectasis at the left base.  Upper lungs clear.  There is no free intraperitoneal gas. Nonspecific air fluid levels in the colon.  No disproportionate dilatation of bowel.  IMPRESSION: Nonobstructive bowel gas pattern.  Subsegmental atelectasis at the left base.   Original Report Authenticated By: Jolaine Click, M.D.    US Abdomen Limited Ruq  07/01/2012  *RADIOLOGY REPORT*  Clinical Data:  History of nausea and right upper quadrant abdominal pain.  LIMITED ABDOMINAL ULTRASOUND - RIGHT UPPER QUADRANT  Comparison:  CT 06/29/2012.  Findings:  Gallbladder: There is cholelithiasis. Multiple shadowing gallstones are seen within the gallbladder. The largest calculus has a greatest diameter of 7 mm. No gallbladder wall thickening or pericholecystic fluid. Negative sonographic Murphy's sign according to the ultrasound technologist. Gallbladder wall thickness is 1.9 mm.  CBD: Normal in caliber measuring approximately 5.7 mm. No choledocholithiasis is evident.  Liver:  Normal size and echotexture without focal parenchymal abnormality. Portal vein is patent with hepatopetal flow.  Right Kidney:  No hydronephrosis.  Simple cyst of right kidney. The cyst measures 1.9 ml of point of normal place 6 cm.  IMPRESSION:  Cholelithiasis.  Multiple gallstones are seen in the gallbladder. No gallbladder wall thickening.  No pericholecystic fluid. Negative sonographic Murphy's sign.  No  bile duct or hepatic abnormality was identified.  Simple cyst of right kidney.   Original Report Authenticated By: Onalee Hua Call     Scheduled Meds: . ciprofloxacin  400 mg Intravenous Q12H  . insulin aspart  0-15 Units Subcutaneous Q4H  . meperidine      . midazolam      . nicotine  14 mg Transdermal Daily  . NIFEdipine  60 mg Oral Daily  . pantoprazole (PROTONIX) IV  40 mg Intravenous Q12H   Continuous Infusions: . sodium chloride 75 mL/hr at 07/01/12 1058  . sodium chloride      Principal Problem:   Abdominal pain Active Problems:   ARTHRITIS, LEFT KNEE   Dehydration   Right kidney mass   Tobacco abuse   DM type 2 (diabetes mellitus, type 2)   Chronic back pain   Abnormal gall bladder diagnostic imaging     Brendia Sacks, MD  [facility] Pager [phone] If 7PM-7AM, please contact night-coverage at [email or web address], password [facility] 07/02/2012, 4:03 PM  LOS: 2 days   Time spent: 20 minutes

## 2012-07-02 NOTE — Telephone Encounter (Signed)
Call from [name].  Stated that she had received a message from Dr. Mariel Sleet to contact office.  Is now and inpatient in room 341.  Seen in ED on Saturday, Sunday and Monday and was admitted on Monday. Having procedure with Dr. Karilyn Cota this afternoon.

## 2012-07-02 NOTE — Progress Notes (Signed)
Subjective: Pain about the same. Nausea somewhat improved but still with intermittent episodes. No fevers or chills. Pain remains colicky in nature and somewhat diffuse. Patient has been passing flatus. She did state she strained and had a small bowel movement. She still the prescribed a bowel movement as dark black sticky with a foul odor.  Objective: Vital signs in last 24 hours: Temp:  [98.2 F (36.8 Lewis)-99.2 F (37.3 Lewis)] 98.2 F (36.8 Lewis) (04/02 0555) Pulse Rate:  [73-82] 73 (04/02 0555) Resp:  [17-20] 17 (04/02 0555) BP: (125-148)/(69-71) 132/69 mmHg (04/02 0555) SpO2:  [96 %-100 %] 100 % (04/02 0555) Last BM Date: 07/27/12  Intake/Output from previous day: 04/01 0701 - 04/02 0700 In: 900 [P.O.:900] Out: 1500 [Urine:1500] Intake/Output this shift: Total I/O In: -  Out: 300 [Urine:300]  General appearance: alert and no distress GI: Intermittent bowel sounds, soft, it moderate diffuse tenderness. No diffuse peritoneal signs.  Lab Results:   Recent Labs  07/01/12 0524 07/02/12 0455  WBC 7.7 9.1  HGB 12.7 13.8  HCT 38.2 42.0  PLT 228 203   BMET  Recent Labs  07/01/12 0524 07/02/12 0455  NA 134* 136  K 3.0* 3.6  CL 97 101  CO2 27 26  GLUCOSE 129* 165*  BUN 12 11  CREATININE 1.26* 1.37*  CALCIUM 9.6 9.5   PT/INR  Recent Labs  07/01/12 0524  LABPROT 13.7  INR 1.06   ABG No results found for this basename: PHART, PCO2, PO2, HCO3,  in the last 72 hours  Studies/Results: Dg Abd Acute W/chest  06/30/2012  *RADIOLOGY REPORT*  Clinical Data: Nominal pain  ACUTE ABDOMEN SERIES (ABDOMEN 2 VIEW & CHEST 1 VIEW)  Comparison: 06/29/2012  Findings: Mild cardiomegaly.  Subsegmental atelectasis at the left base.  Upper lungs clear.  There is no free intraperitoneal gas. Nonspecific air fluid levels in the colon.  No disproportionate dilatation of bowel.  IMPRESSION: Nonobstructive bowel gas pattern.  Subsegmental atelectasis at the left base.   Original Report  Authenticated By: Jolaine Click, M.D.    US Abdomen Limited Ruq  07/01/2012  *RADIOLOGY REPORT*  Clinical Data:  History of nausea and right upper quadrant abdominal pain.  LIMITED ABDOMINAL ULTRASOUND - RIGHT UPPER QUADRANT  Comparison:  CT 06/29/2012.  Findings:  Gallbladder: There is cholelithiasis. Multiple shadowing gallstones are seen within the gallbladder. The largest calculus has a greatest diameter of 7 mm. No gallbladder wall thickening or pericholecystic fluid. Negative sonographic Murphy's sign according to the ultrasound technologist. Gallbladder wall thickness is 1.9 mm.  CBD: Normal in caliber measuring approximately 5.7 mm. No choledocholithiasis is evident.  Liver:  Normal size and echotexture without focal parenchymal abnormality. Portal vein is patent with hepatopetal flow.  Right Kidney:  No hydronephrosis.  Simple cyst of right kidney. The cyst measures 1.9 ml of point of normal place 6 cm.  IMPRESSION:  Cholelithiasis.  Multiple gallstones are seen in the gallbladder. No gallbladder wall thickening.  No pericholecystic fluid. Negative sonographic Murphy's sign.  No bile duct or hepatic abnormality was identified.  Simple cyst of right kidney.   Original Report Authenticated By: Onalee Hua Call     Anti-infectives: Anti-infectives   Start     Dose/Rate Route Frequency Ordered Stop   07/01/12 0200  ciprofloxacin (CIPRO) IVPB 400 mg     400 mg 200 mL/hr over 60 Minutes Intravenous Every 12 hours 07/01/12 0137        Assessment/Plan: s/p * No surgery found * Abdominal pain, nausea,  melanic stools, cholelithiasis. Patient's right upper quadrant ultrasound results were discussed with her and her family. There is evidence of cholelithiasis but no evidence of acute inflammatory changes with the gallbladder. I therefore more suspicious that the gallbladder is a red Herring as her causative etiology of her symptoms. Gastroenterology has been consult and I do feel the patient would likely  benefit from endoscopy. Patient is demonstrating some bowel function with passage of flatus and I am not convinced that she has a small bowel obstruction at this time. At this time there is no acute surgical indications however I'll continue to follow the patient and her course.  LOS: 2 days    [name] 07/02/2012

## 2012-07-02 NOTE — Progress Notes (Signed)
UR Chart Review Completed  

## 2012-07-02 NOTE — Op Note (Signed)
EGD PROCEDURE REPORT  PATIENT:  [name]  MR#:  [id number] Birthdate:  1945-06-18, 67 y.o., female Endoscopist:  Dr. Malissa Hippo, MD Procedure Date: 07/02/2012  Procedure:   EGD  Indications:  Patient is 67 year old African female who presents with acute onset of abdominal pain and intractable nausea. On workup she is found to have cholelithiasis but her symptoms are not typical of biliary tract disease. She is therefore undergoing diagnostic EGD to make sure she does not have peptic ulcer disease.            Informed Consent:  The risks, benefits, alternatives & imponderables which include, but are not limited to, bleeding, infection, perforation, drug reaction and potential missed lesion have been reviewed.  The potential for biopsy, lesion removal, esophageal dilation, etc. have also been discussed.  Questions have been answered.  All parties agreeable.  Please see history & physical in medical record for more information.  Medications:  Demerol 50 mg IV Versed 6 mg IV Cetacaine spray topically for oropharyngeal anesthesia  Description of procedure:  The endoscope was introduced through the mouth and advanced to the second portion of the duodenum without difficulty or limitations. The mucosal surfaces were surveyed very carefully during advancement of the scope and upon withdrawal.  Findings:  Esophagus:  Mucosa of the esophagus was normal. GE junction was unremarkable. GEJ:  40 cm Stomach:  Stomach was empty and distended very well with insufflation. Folds in the proximal stomach were normal. Mucosa at body, antrum, pyloric channel, angularis, fundus and cardia was normal. Duodenum:  Normal bulbar and post bulbar mucosa.   Therapeutic/Diagnostic Maneuvers Performed:  None  Complications:  None  Impression: Normal esophagogastroduodenoscopy.  Recommendations:  Full liquid diet. Will convey results of EGD to Dr. Leticia Penna. With change PPI to oral route in  a.m.   Nathaneil Feagans U  07/02/2012  4:25 PM  CC: Dr. Phillips Odor, Chancy Hurter, MD & Dr. Bonnetta Barry ref. provider found

## 2012-07-02 NOTE — Progress Notes (Signed)
Inpatient Diabetes Program Recommendations  AACE/ADA: New Consensus Statement on Inpatient Glycemic Control (2013)  Target Ranges:  Prepandial:   less than 140 mg/dL      Peak postprandial:   less than 180 mg/dL (1-2 hours)      Critically ill patients:  140 - 180 mg/dL   Results for [name], [name] (MRN [id number]) as of 07/02/2012 10:09  Ref. Range 07/01/2012 07:39 07/01/2012 11:39 07/01/2012 17:21 07/01/2012 20:13 07/02/2012 00:40 07/02/2012 01:00 07/02/2012 04:15 07/02/2012 08:28  Glucose-Capillary Latest Range: 70-99 mg/dL 086 (H) 578 (H) 80 469 (H) 53 (L) 72 222 (H) 123 (H)    Inpatient Diabetes Program Recommendations Correction (SSI): Please consider decreasing Novolog correction scale to sensitive scale.  Note: In reviewing the chart, noted that patient was given Novolog 5 units at 20:42 on 07/01/12 for CBG of 209 mg/dl and then the following CBG 4 hours later was noted to be 53 mg/dl.  Please consider decreasing Novolog correction to sensitive scale to prevent subsequent episodes of hypoglycemia.  Will continue to follow.   Thanks, Orlando Penner, RN, BSN, CCRN Diabetes Coordinator Inpatient Diabetes Program [phone]

## 2012-07-03 DIAGNOSIS — E876 Hypokalemia: Secondary | ICD-10-CM | POA: Diagnosis present

## 2012-07-03 LAB — BASIC METABOLIC PANEL
CO2: 26 mEq/L (ref 19–32)
Calcium: 9.3 mg/dL (ref 8.4–10.5)
GFR calc Af Amer: 57 mL/min — ABNORMAL LOW (ref >90)
GFR calc non Af Amer: 49 mL/min — ABNORMAL LOW (ref >90)
Sodium: 140 mEq/L (ref 135–145)

## 2012-07-03 LAB — GLUCOSE, CAPILLARY
Glucose-Capillary: 138 mg/dL — ABNORMAL HIGH (ref 70–99)
Glucose-Capillary: 175 mg/dL — ABNORMAL HIGH (ref 70–99)
Glucose-Capillary: 192 mg/dL — ABNORMAL HIGH (ref 70–99)
Glucose-Capillary: 196 mg/dL — ABNORMAL HIGH (ref 70–99)

## 2012-07-03 LAB — CBC
Platelets: 221 10*3/uL (ref 150–400)
RBC: 4.34 MIL/uL (ref 3.87–5.11)
WBC: 5.1 10*3/uL (ref 4.0–10.5)

## 2012-07-03 MED ORDER — POTASSIUM CHLORIDE CRYS ER 20 MEQ PO TBCR
40.0000 meq | EXTENDED_RELEASE_TABLET | Freq: Once | ORAL | Status: AC
  Administered 2012-07-03: 40 meq via ORAL
  Filled 2012-07-03: qty 2

## 2012-07-03 MED ORDER — ZOLPIDEM TARTRATE 5 MG PO TABS
5.0000 mg | ORAL_TABLET | Freq: Once | ORAL | Status: AC
  Administered 2012-07-03: 5 mg via ORAL
  Filled 2012-07-03: qty 1

## 2012-07-03 MED ORDER — ALPRAZOLAM 0.25 MG PO TABS: 0.2500 mg | ORAL_TABLET | Freq: Every evening | ORAL | Status: DC | PRN

## 2012-07-03 NOTE — Progress Notes (Signed)
[facility] PROGRESS NOTE  [name] ZHY:[id number] DOB: 25-Dec-1945 DOA: 06/30/2012 PCP: Colette Ribas, MD  Assessment/Plan: 1. Abdominal pain: Etiology remains obscure. Appreciate general surgery and gastroenterology evaluations. No surgical intervention recommended. Imaging unrevealing and EGD was normal. Reports very little pain today. Tolerating full liquids without problem. Will advance diet 2. Intractable nausea: Etiology unclear. Much improved this am. Tolerating full liquids.   3. Cholelithiasis: no surgical intervention recommended.  4. Diabetes mellitus type 2 with hypoglycemia: Hemoglobin A1c 6.8. CBG range 138-200. Will continue SSI. Will plan to resume home ACTOS when eating more reliably.  5. Dehydration: improved. Increased po intake. Will decrease rate of IV fluid. 6. Constipation: Reports flatulence but no BM. Miralax given this am.   7. History of PE: No longer on anticoagulation. 8. Perinephric stranding right kidney: Present August 2012. 9. Right kidney mass: Suspected cyst. Has been seen previously. Followup with urology as an outpatient. 10. Hypokalemia: mild. Likely related to decreased po intake. Will replete and recheck in am.  Code Status: full Family Communication: none present Disposition Plan: home when ready hopefully 24 hrs   Consultants:  GI  General surgery  Procedures:  Upper endoscopy 07/02/12  Antibiotics:  Cipro 07/01/12>>>  HPI/Subjective: Up in chair watching TV. Denies pain/discomfort  Objective: Filed Vitals:   07/02/12 1620 07/02/12 1652 07/02/12 2046 07/03/12 0609  BP: 114/50 148/80 130/71 135/73  Pulse: 74 78 91 75  Temp:  97.9 F (36.6 C) 97.8 F (36.6 C) 98.4 F (36.9 C)  TempSrc:  Oral Oral   Resp: 15 16 20 20   Height:      Weight:      SpO2: 98% 96% 98% 95%   No intake or output data in the 24 hours ending 07/03/12 1117 Filed Weights   06/30/12 2123 07/01/12 0153  Weight: 102.059 kg (225 lb) 100.336 kg  (221 lb 3.2 oz)    Exam:   General:  Obese, pleasant  Cardiovascular: RRR No MGR no LE edema  Respiratory: Normal effort BS clear bilaterally. No wheeze no rhonchi  Abdomen: obese soft +BS mild tenderness LLQ otherwise non-tender  Musculoskeletal: moves all extremities. No clubbing/cyanosis   Data Reviewed: Basic Metabolic Panel:  Recent Labs Lab 06/28/12 0446 06/30/12 2204 07/01/12 0524 07/02/12 0455 07/03/12 0506  NA 137 133* 134* 136 140  K 3.3* 3.5 3.0* 3.6 3.3*  CL 98 91* 97 101 106  CO2 25 27 27 26 26   GLUCOSE 238* 218* 129* 165* 125*  BUN 12 13 12 11 6   CREATININE 0.94 1.33* 1.26* 1.37* 1.12*  CALCIUM 10.0 10.3 9.6 9.5 9.3   Liver Function Tests:  Recent Labs Lab 06/28/12 0446 06/30/12 2204 07/01/12 0524 07/02/12 0455  AST 15 16 13 13   ALT 12 12 10 10   ALKPHOS 106 106 92 92  BILITOT 0.3 0.3 0.3 0.2*  PROT 7.7 8.2 7.3 6.9  ALBUMIN 4.0 4.0 3.5 3.3*    Recent Labs Lab 06/29/12 1820 06/30/12 2204 07/01/12 0524  LIPASE 23 65* 54   No results found for this basename: AMMONIA,  in the last 168 hours CBC:  Recent Labs Lab 06/28/12 0446 06/30/12 2204 07/01/12 0524 07/02/12 0455 07/03/12 0506  WBC 8.1 7.4 7.7 9.1 5.1  NEUTROABS 6.4 6.0  --   --   --   HGB 12.9 13.6 12.7 13.8 12.6  HCT 38.8 40.3 38.2 42.0 38.5  MCV 88.4 87.2 87.6 88.6 88.7  PLT 254 251 228 203 221   Cardiac Enzymes:  Recent Labs Lab 06/28/12 0446 06/29/12 1820 06/30/12 2204  TROPONINI <0.30 <0.30 <0.30   BNP (last 3 results) No results found for this basename: PROBNP,  in the last 8760 hours CBG:  Recent Labs Lab 07/02/12 1451 07/02/12 1648 07/02/12 2205 07/03/12 0721 07/03/12 0831  GLUCAP 90 108* 180* 138* 201*    Recent Results (from the past 240 hour(s))  URINE CULTURE     Status: None   Collection Time    06/30/12 11:37 PM      Result Value Range Status   Specimen Description URINE, CATHETERIZED   Final   Special Requests NONE   Final   Culture   Setup Time 06/30/2012 23:30   Final   Colony Count NO GROWTH   Final   Culture NO GROWTH   Final   Report Status 2012/07/26 FINAL   Final     Studies: US Abdomen Limited Ruq  2012-07-26  *RADIOLOGY REPORT*  Clinical Data:  History of nausea and right upper quadrant abdominal pain.  LIMITED ABDOMINAL ULTRASOUND - RIGHT UPPER QUADRANT  Comparison:  CT 06/29/2012.  Findings:  Gallbladder: There is cholelithiasis. Multiple shadowing gallstones are seen within the gallbladder. The largest calculus has a greatest diameter of 7 mm. No gallbladder wall thickening or pericholecystic fluid. Negative sonographic Murphy's sign according to the ultrasound technologist. Gallbladder wall thickness is 1.9 mm.  CBD: Normal in caliber measuring approximately 5.7 mm. No choledocholithiasis is evident.  Liver:  Normal size and echotexture without focal parenchymal abnormality. Portal vein is patent with hepatopetal flow.  Right Kidney:  No hydronephrosis.  Simple cyst of right kidney. The cyst measures 1.9 ml of point of normal place 6 cm.  IMPRESSION:  Cholelithiasis.  Multiple gallstones are seen in the gallbladder. No gallbladder wall thickening.  No pericholecystic fluid. Negative sonographic Murphy's sign.  No bile duct or hepatic abnormality was identified.  Simple cyst of right kidney.   Original Report Authenticated By: Onalee Hua Call     Scheduled Meds: . ciprofloxacin  400 mg Intravenous Q12H  . insulin aspart  0-9 Units Subcutaneous TID WC  . nicotine  14 mg Transdermal Daily  . NIFEdipine  60 mg Oral Daily  . pantoprazole  40 mg Oral Q1200  . polyethylene glycol  17 g Oral Daily   Continuous Infusions: . sodium chloride 75 mL/hr at Jul 26, 2012 1058  . sodium chloride      Principal Problem:   Abdominal pain Active Problems:   ARTHRITIS, LEFT KNEE   Dehydration   Right kidney mass   Tobacco abuse   DM type 2 (diabetes mellitus, type 2)   Chronic back pain   Abnormal gall bladder diagnostic imaging    Hypokalemia    Time spent: 30 minutes    [facility] M  [facility]  If 7PM-7AM, please contact night-coverage at [email or web address], password [facility] 07/03/2012, 11:17 AM  LOS: 3 days

## 2012-07-03 NOTE — Progress Notes (Signed)
Patient ID: [name], female   DOB: Aug 22, 1945, 67 y.o.   MRN: [id number] Feels better. Sitting up eating breakfast this am. Soft diet. No nausea or vomiting. No abdominal pain. She tells me she feels 100% better. Underwent an EGD yesterday which revealed a normal EGD. Will continue to monitor.

## 2012-07-03 NOTE — Progress Notes (Signed)
Patient seen, independently examined and chart reviewed. I agree with exam, assessment and plan discussed with Toya Smothers, NP.  Interval events: Status post EGD, normal.  Subjective: Feels better today. Tolerating food. Nausea much better.  Objective: Afebrile, vital signs stable. Appears calm. Sitting in chair.  Labs: Potassium 3.3. Creatinine improved 1.12.  Acute issues:  Abdominal pain with associated intractable nausea  Cholelithiasis  Diabetes mellitus  Constipation  Plan:  Continue diet.  Hopefully home in the morning if tolerating diet.  Summary: 67 year old woman present with ongoing abdominal pain. This pain developed in the lower abdomen and then progressed upwards with radiation to her neck and chest. Nausea no vomiting. Complained of black-colored stool, took Pepto-Bismol. Lipase was modestly elevated, LFTs were unremarkable. Further evaluation included CT of the abdomen and pelvis as well as abdominal ultrasound, surgical consultation and gastroenterology consultation. CT of the abdomen and pelvis demonstrate chronic findings in the right kidney as well as cholelithiasis. Abdominal ultrasound demonstrated gallstones without evidence of cholecystitis. EGD was normal. Status post EGD abdominal pain resolved. Suspicion centered upon asymptomatic cholelithiasis.   Brendia Sacks, MD [facility] [phone]

## 2012-07-03 NOTE — Progress Notes (Signed)
1 Day Post-Op  Subjective: Feels much better. Tolerating full liquid diet well. Denies any right upper quadrant abdominal pain.  Objective: Vital signs in last 24 hours: Temp:  [97.8 F (36.6 C)-98.4 F (36.9 C)] 98.4 F (36.9 C) (04/03 0609) Pulse Rate:  [74-91] 75 (04/03 0609) Resp:  [13-20] 20 (04/03 0609) BP: (114-160)/(50-85) 135/73 mmHg (04/03 0609) SpO2:  [95 %-100 %] 95 % (04/03 0609) Last BM Date: 07/02/12  Intake/Output from previous day: 04/02 0701 - 04/03 0700 In: -  Out: 300 [Urine:300] Intake/Output this shift:    General appearance: alert, cooperative and no distress GI: soft, non-tender; bowel sounds normal; no masses,  no organomegaly  Lab Results:   Recent Labs  07/02/12 0455 07/03/12 0506  WBC 9.1 5.1  HGB 13.8 12.6  HCT 42.0 38.5  PLT 203 221   BMET  Recent Labs  07/02/12 0455 07/03/12 0506  NA 136 140  K 3.6 3.3*  CL 101 106  CO2 26 26  GLUCOSE 165* 125*  BUN 11 6  CREATININE 1.37* 1.12*  CALCIUM 9.5 9.3   PT/INR  Recent Labs  07/01/12 0524  LABPROT 13.7  INR 1.06    Studies/Results: US Abdomen Limited Ruq  07/01/2012  *RADIOLOGY REPORT*  Clinical Data:  History of nausea and right upper quadrant abdominal pain.  LIMITED ABDOMINAL ULTRASOUND - RIGHT UPPER QUADRANT  Comparison:  CT 06/29/2012.  Findings:  Gallbladder: There is cholelithiasis. Multiple shadowing gallstones are seen within the gallbladder. The largest calculus has a greatest diameter of 7 mm. No gallbladder wall thickening or pericholecystic fluid. Negative sonographic Murphy's sign according to the ultrasound technologist. Gallbladder wall thickness is 1.9 mm.  CBD: Normal in caliber measuring approximately 5.7 mm. No choledocholithiasis is evident.  Liver:  Normal size and echotexture without focal parenchymal abnormality. Portal vein is patent with hepatopetal flow.  Right Kidney:  No hydronephrosis.  Simple cyst of right kidney. The cyst measures 1.9 ml of point of  normal place 6 cm.  IMPRESSION:  Cholelithiasis.  Multiple gallstones are seen in the gallbladder. No gallbladder wall thickening.  No pericholecystic fluid. Negative sonographic Murphy's sign.  No bile duct or hepatic abnormality was identified.  Simple cyst of right kidney.   Original Report Authenticated By: Onalee Hua Call     Anti-infectives: Anti-infectives   Start     Dose/Rate Route Frequency Ordered Stop   07/01/12 0200  ciprofloxacin (CIPRO) IVPB 400 mg     400 mg 200 mL/hr over 60 Minutes Intravenous Every 12 hours 07/01/12 0137        Assessment/Plan: s/p Procedure(s): ESOPHAGOGASTRODUODENOSCOPY (EGD) Impression: Abdominal pain resolved. Suspect patient has asymptomatic cholelithiasis. No need for acute surgical intervention at this time. Will continue to follow patient with you.  LOS: 3 days    Jermia Rigsby A 07/03/2012

## 2012-07-04 LAB — BASIC METABOLIC PANEL
BUN: 12 mg/dL (ref 6–23)
Chloride: 105 mEq/L (ref 96–112)
GFR calc Af Amer: 50 mL/min — ABNORMAL LOW (ref >90)
GFR calc non Af Amer: 43 mL/min — ABNORMAL LOW (ref >90)
Potassium: 4 mEq/L (ref 3.5–5.1)
Sodium: 138 mEq/L (ref 135–145)

## 2012-07-04 LAB — GLUCOSE, CAPILLARY: Glucose-Capillary: 152 mg/dL — ABNORMAL HIGH (ref 70–99)

## 2012-07-04 LAB — CBC
Hemoglobin: 11.9 g/dL — ABNORMAL LOW (ref 12.0–15.0)
MCHC: 32.5 g/dL (ref 30.0–36.0)
RBC: 4.15 MIL/uL (ref 3.87–5.11)
WBC: 4.8 10*3/uL (ref 4.0–10.5)

## 2012-07-04 MED ORDER — PANTOPRAZOLE SODIUM 40 MG PO TBEC: 40.0000 mg | DELAYED_RELEASE_TABLET | Freq: Every day | ORAL | Status: DC

## 2012-07-04 MED ORDER — CIPROFLOXACIN HCL 250 MG PO TABS: 500.0000 mg | ORAL_TABLET | Freq: Two times a day (BID) | ORAL | Status: DC

## 2012-07-04 MED ORDER — CIPROFLOXACIN HCL 250 MG PO TABS: 250.0000 mg | ORAL_TABLET | Freq: Two times a day (BID) | ORAL | Status: DC

## 2012-07-04 NOTE — Discharge Summary (Signed)
Agree with discharge. See my progress note.  Brendia Sacks, MD [facility] [phone]

## 2012-07-04 NOTE — Progress Notes (Signed)
Patient seen, independently examined and chart reviewed. I agree with exam, assessment and plan discussed with Toya Smothers, NP.  Interval events: No new events. Feels well.  Subjective: Feels well. Tolerating food.  Objective: Afebrile, vital signs stable.   Labs: Basic metabolic panel unremarkable. CBC stable.  Acute issues:  Abdominal pain with associated intractable nausea  Cholelithiasis  Diabetes mellitus  Constipation  Chronic right kidney abnormalities which appears stable by imaging. No evidence of acute infection. Discussed with patient and she will call Dr. Patsi Sears for followup.  Plan:  Discharge home  Followup abnormalities the right kidney with urologist     Brendia Sacks, MD [facility] [phone]

## 2012-07-04 NOTE — Discharge Summary (Signed)
Physician Discharge Summary  [name] XBM:[id number] DOB: [birth year] DOA: 06/30/2012  PCP: Colette Ribas, MD  Admit date: 06/30/2012 Discharge date: 07/04/2012  Time spent: 40 minutes  Recommendations for Outpatient Follow-up:  1. Follow up with PCP in 1 week for evaluation of symptoms. Discharge Diagnoses:  Principal Problem:   Abdominal pain Active Problems:   ARTHRITIS, LEFT KNEE   Dehydration   Right kidney mass   Tobacco abuse   DM type 2 (diabetes mellitus, type 2)   Chronic back pain   Abnormal gall bladder diagnostic imaging   Hypokalemia   Discharge Condition: stable  Diet recommendation: bland  Filed Weights   06/30/12 2123 07/01/12 0153  Weight: 102.059 kg (225 lb) 100.336 kg (221 lb 3.2 oz)    History of present illness:   [name] is a 67 y.o. female the past with history of, diabetes, chronic back pain, who was in her usual state of health till 06/28/12 when she started developing abdominal pain, followed by onset of nausea. The abdominal pain  predominantly located in the upper abdomen. She wasn't able to specify which part. The pain radiated through her chest into the neck area. She became very nauseous, but did not have any vomiting. She admited to a history of acid reflux. Denied diarrhea, and had been constipated since 06/27/12. She had 2 enemas over 2 prior days and had small black colored stool. She admited to taking Pepto-Bismol on 06/28/12. The pain was 10 out of 10 in intensity. It was a achy pain, and was constant. She'd never had similar pain in the past. She denied any history of endoscopies. Had chills, but denied fevers. Denied any problems with urination. Had poor oral intake in the days prior to presentation on 07/01/12 and felt dehydrated. This was her third visit to the emergency department for same reason.  Hospital Course:  1. Abdominal pain: Etiology remains obscure. Pt admitted, made NPO and started on Cipro empirically. Pt seen by GI  and had EGD that was normal. Seen by general surgery who opined asymptomatic cholelithiasis. Pt given clear liquids, IV fluids and pain medicine. Pain gradually subsided and diet gradually advanced. Surgery followed and stated no  surgical intervention recommended. At discharge pt tolerating bland diet and abdominal pain resolved.  2. Intractable nausea: See above: at discharge tolerating bland diet without nausea.  3. Cholelithiasis: no surgical intervention recommended.  4. Diabetes mellitus type 2 with hypoglycemia: Hemoglobin A1c 6.8. CBG range 152-196. Provided with SSI during hospitalization. At discharge resumed home meds. Recommend close follow up with PCP for optimal glycemic control.   5. Dehydration: resolved s/p IV fluids.  6. Constipation: Reports flatulence and small BM at discharge.   7. History of PE: No longer on anticoagulation. 8. Perinephric stranding right kidney: Present August 2012. 9. Right kidney mass: Suspected cyst. Has been seen previously. Followup with urology as an outpatient. 10. Hypokalemia: mild. Likely related to decreased po intake. Repleted and resolved at discharge.     Procedures: Upper endoscopy 07/02/12 Consultations:  GI  General surgery  Discharge Exam: Filed Vitals:   07/03/12 0609 07/03/12 1400 07/03/12 2048 07/04/12 0554  BP: 135/73 144/81 132/67 127/81  Pulse: 75 85 91 80  Temp: 98.4 F (36.9 C) 98.9 F (37.2 C) 97.7 F (36.5 C) 98.8 F (37.1 C)  TempSrc:  Oral Oral   Resp: 20 20 20 20   Height:      Weight:      SpO2: 95% 98% 100%  96%    General: well nourished NAD Cardiovascular: RRR No MGR No LE edema Respiratory: normal effort BS clear no wheeze no rhonchi Abdomen: soft +BS non-tender to palpation  Discharge Instructions       Future Appointments Provider Department Dept Phone   11/03/2012 9:10 AM [facility] [phone]   11/05/2012 9:00 AM Randall An, MD [facility]  [phone]       Medication List    TAKE these medications       aspirin 81 MG tablet  Take 81 mg by mouth every evening.     b complex vitamins tablet  Take 1 tablet by mouth daily. Takes 2 in the morning.     dexlansoprazole 60 MG capsule  Commonly known as:  DEXILANT  Take 60 mg by mouth every evening.     fish oil-omega-3 fatty acids 1000 MG capsule  Take 1 g by mouth daily.     Flaxseed (Linseed) 1000 MG Caps  Take 1 capsule by mouth daily.     folic acid 1 MG tablet  Commonly known as:  FOLVITE  Take 1 tablet (1 mg total) by mouth daily.     glimepiride 2 MG tablet  Commonly known as:  AMARYL  Take 2 mg by mouth 2 (two) times daily.     LIDODERM 5 %  Generic drug:  lidocaine  Place 1 patch onto the skin daily. Remove & Discard patch within 12 hours or as directed by MD     NIFEdipine 60 MG 24 hr tablet  Commonly known as:  PROCARDIA XL/ADALAT-CC  Take 60 mg by mouth daily.     ondansetron 4 MG disintegrating tablet  Commonly known as:  ZOFRAN ODT  Take 1 tablet (4 mg total) by mouth every 8 (eight) hours as needed for nausea.     oxyCODONE-acetaminophen 5-325 MG per tablet  Commonly known as:  PERCOCET/ROXICET  Take 1-2 tablets by mouth every 4 (four) hours as needed for pain.     pioglitazone 30 MG tablet  Commonly known as:  ACTOS  Take 30 mg by mouth daily.     promethazine 25 MG tablet  Commonly known as:  PHENERGAN  Take 0.5 tablets (12.5 mg total) by mouth every 8 (eight) hours as needed for nausea.     PROVENTIL HFA 108 (90 BASE) MCG/ACT inhaler  Generic drug:  albuterol  Inhale 1 puff into the lungs daily as needed. For shortness of breath/wheezing     simvastatin 20 MG tablet  Commonly known as:  ZOCOR  Take 20 mg by mouth At bedtime.     sucralfate 1 G tablet  Commonly known as:  CARAFATE  Take 1 tablet (1 g total) by mouth 4 (four) times daily.     TRADJENTA 5 MG Tabs tablet  Generic drug:  linagliptin  Take 5 mg by mouth daily.      TYLENOL 500 MG tablet  Generic drug:  acetaminophen  Take 500 mg by mouth every 6 (six) hours as needed for pain. Takes 2 as needed for discomfort     vitamin C 1000 MG tablet  Take 1,000 mg by mouth daily.     Vitamin D3 1000 UNITS Caps  Take 1 tablet by mouth daily.     zolpidem 10 MG tablet  Commonly known as:  AMBIEN  Take 10 mg by mouth at bedtime as needed for sleep.       Follow-up Information  Follow up with Colette Ribas, MD. Schedule an appointment as soon as possible for a visit in 1 week. (for follow up of symptoms)    Contact information:   [address] [place] [address] [phone]        The results of significant diagnostics from this hospitalization (including imaging, microbiology, ancillary and laboratory) are listed below for reference.    Significant Diagnostic Studies: Dg Chest 2 View  06/29/2012  *RADIOLOGY REPORT*  Clinical Data: Chest pain  CHEST - 2 VIEW  Comparison: 06/28/2012  Findings: Mild cardiomegaly. Mild patchy density at the posterior left lung base.  No pneumothorax or pleural effusion.  Increased AP diameter chest and bronchitic changes are noted.  Stable thoracic spine.  IMPRESSION: Cardiomegaly without decompensation.  Mild patchy atelectasis verses airspace disease at the posterior left lung base.  Changes related to COPD.   Original Report Authenticated By: Jolaine Click, M.D.    Ct Abdomen Pelvis W Contrast  06/29/2012  *RADIOLOGY REPORT*  Clinical Data: Lower abdominal pain and emesis.  CT ABDOMEN AND PELVIS WITH CONTRAST  Technique:  Multidetector CT imaging of the abdomen and pelvis was performed following the standard protocol during bolus administration of intravenous contrast.  Contrast: 50mL OMNIPAQUE IOHEXOL 300 MG/ML  SOLN, OMNIPAQUE IOHEXOL 300 MG/ML  SOLN  Comparison: 11/17/2010  Findings: Diffuse hepatic steatosis.  Small gallstones are suspected, best viewed on sagittal  reconstruction imaging.  Lesion in the lower pole of the left kidney is less distinct and smaller on image 37.  On image 29, the 2.5 cm enhancing lesion in the right kidney has enlarged worrisome for renal cell carcinoma.  Other hypodensities in both kidneys are stable.  Severe atrophy of the right kidney is again noted.  There is extensive stranding surrounding the right kidney.  No definite ureteral calculus.  Stable left adrenal nodule.  Right adrenal gland and pancreas are within normal limits.  Normal appendix.  Sigmoid diverticulosis without evidence of acute diverticulitis.  Advanced facet arthropathy with spinal stenosis at in the lower lumbar spine.  No vertebral compression deformity.  No evidence of free fluid.  Unremarkable bladder.  IMPRESSION: Inflammatory changes of the right kidney are suspected.  Cholelithiasis is suspected and cannot be confirmed with sonography.  Lesion in the right kidney is larger worrisome for renal cell carcinoma.  This was incompletely characterized on a previous MRI. Repeat MR may be helpful when the patient can hold her breath.   Original Report Authenticated By: Jolaine Click, M.D.    Dg Abd Acute W/chest  06/30/2012  *RADIOLOGY REPORT*  Clinical Data: Nominal pain  ACUTE ABDOMEN SERIES (ABDOMEN 2 VIEW & CHEST 1 VIEW)  Comparison: 06/29/2012  Findings: Mild cardiomegaly.  Subsegmental atelectasis at the left base.  Upper lungs clear.  There is no free intraperitoneal gas. Nonspecific air fluid levels in the colon.  No disproportionate dilatation of bowel.  IMPRESSION: Nonobstructive bowel gas pattern.  Subsegmental atelectasis at the left base.   Original Report Authenticated By: Jolaine Click, M.D.    Dg Abd Acute W/chest  06/28/2012  *RADIOLOGY REPORT*  Clinical Data: Lower abdominal pain and chest pain.  ACUTE ABDOMEN SERIES (ABDOMEN 2 VIEW & CHEST 1 VIEW)  Comparison: Chest x-ray 05/20/2012.  Findings: Lung volumes are slightly low.  There are some bibasilar  opacities which may reflect areas of atelectasis and/or consolidation.  No definite pleural effusions.  No evidence of pulmonary edema.  Heart size is upper limits  of normal.  Upper mediastinal contours are unremarkable.  Orthopedic fixation hardware in the lower cervical spine incompletely imaged.  Gas and stool are seen scattered throughout the colon extending to the level of the distal rectum.  No pathologic distension of small bowel is noted.  No gross evidence of pneumoperitoneum.  Radiopaque objects projecting over the lower pelvis are presumably exterior to the patient.  IMPRESSION: 1.  Nonobstructive bowel gas pattern. 2.  No pneumoperitoneum. 3.  Low lung volumes with bibasilar atelectasis and/or consolidation.   Original Report Authenticated By: Trudie Reed, M.D.    US Abdomen Limited Ruq  07/01/2012  *RADIOLOGY REPORT*  Clinical Data:  History of nausea and right upper quadrant abdominal pain.  LIMITED ABDOMINAL ULTRASOUND - RIGHT UPPER QUADRANT  Comparison:  CT 06/29/2012.  Findings:  Gallbladder: There is cholelithiasis. Multiple shadowing gallstones are seen within the gallbladder. The largest calculus has a greatest diameter of 7 mm. No gallbladder wall thickening or pericholecystic fluid. Negative sonographic Murphy's sign according to the ultrasound technologist. Gallbladder wall thickness is 1.9 mm.  CBD: Normal in caliber measuring approximately 5.7 mm. No choledocholithiasis is evident.  Liver:  Normal size and echotexture without focal parenchymal abnormality. Portal vein is patent with hepatopetal flow.  Right Kidney:  No hydronephrosis.  Simple cyst of right kidney. The cyst measures 1.9 ml of point of normal place 6 cm.  IMPRESSION:  Cholelithiasis.  Multiple gallstones are seen in the gallbladder. No gallbladder wall thickening.  No pericholecystic fluid. Negative sonographic Murphy's sign.  No bile duct or hepatic abnormality was identified.  Simple cyst of right kidney.   Original  Report Authenticated By: Onalee Hua Call     Microbiology: Recent Results (from the past 240 hour(s))  URINE CULTURE     Status: None   Collection Time    06/30/12 11:37 PM      Result Value Range Status   Specimen Description URINE, CATHETERIZED   Final   Special Requests NONE   Final   Culture  Setup Time 06/30/2012 23:30   Final   Colony Count NO GROWTH   Final   Culture NO GROWTH   Final   Report Status 07/01/2012 FINAL   Final     Labs: Basic Metabolic Panel:  Recent Labs Lab 06/30/12 2204 07/01/12 0524 07/02/12 0455 07/03/12 0506 07/04/12 0510  NA 133* 134* 136 140 138  K 3.5 3.0* 3.6 3.3* 4.0  CL 91* 97 101 106 105  CO2 27 27 26 26 25   GLUCOSE 218* 129* 165* 125* 147*  BUN 13 12 11 6 12   CREATININE 1.33* 1.26* 1.37* 1.12* 1.25*  CALCIUM 10.3 9.6 9.5 9.3 9.2   Liver Function Tests:  Recent Labs Lab 06/28/12 0446 06/30/12 2204 07/01/12 0524 07/02/12 0455  AST 15 16 13 13   ALT 12 12 10 10   ALKPHOS 106 106 92 92  BILITOT 0.3 0.3 0.3 0.2*  PROT 7.7 8.2 7.3 6.9  ALBUMIN 4.0 4.0 3.5 3.3*    Recent Labs Lab 06/29/12 1820 06/30/12 2204 07/01/12 0524  LIPASE 23 65* 54   No results found for this basename: AMMONIA,  in the last 168 hours CBC:  Recent Labs Lab 06/28/12 0446 06/30/12 2204 07/01/12 0524 07/02/12 0455 07/03/12 0506 07/04/12 0510  WBC 8.1 7.4 7.7 9.1 5.1 4.8  NEUTROABS 6.4 6.0  --   --   --   --   HGB 12.9 13.6 12.7 13.8 12.6 11.9*  HCT 38.8 40.3 38.2 42.0 38.5 36.6  MCV 88.4 87.2 87.6 88.6 88.7 88.2  PLT 254 251 228 203 221 212   Cardiac Enzymes:  Recent Labs Lab 06/28/12 0446 06/29/12 1820 06/30/12 2204  TROPONINI <0.30 <0.30 <0.30   BNP: BNP (last 3 results) No results found for this basename: PROBNP,  in the last 8760 hours CBG:  Recent Labs Lab 07/03/12 1150 07/03/12 1601 07/03/12 2052 07/04/12 0805 07/04/12 1107  GLUCAP 175* 192* 196* 152* 198*       Signed:  BLACK,KAREN M  [facility] 07/04/2012, 2:04 PM

## 2012-07-07 ENCOUNTER — Encounter (HOSPITAL_COMMUNITY): Payer: Self-pay | Admitting: Internal Medicine

## 2012-07-07 DIAGNOSIS — Z6835 Body mass index (BMI) 35.0-35.9, adult: Secondary | ICD-10-CM | POA: Diagnosis not present

## 2012-07-07 DIAGNOSIS — R1084 Generalized abdominal pain: Secondary | ICD-10-CM | POA: Diagnosis not present

## 2012-07-07 DIAGNOSIS — E860 Dehydration: Secondary | ICD-10-CM | POA: Diagnosis not present

## 2012-07-07 DIAGNOSIS — E878 Other disorders of electrolyte and fluid balance, not elsewhere classified: Secondary | ICD-10-CM | POA: Diagnosis not present

## 2012-07-23 DIAGNOSIS — E878 Other disorders of electrolyte and fluid balance, not elsewhere classified: Secondary | ICD-10-CM | POA: Diagnosis not present

## 2012-07-23 DIAGNOSIS — Z6834 Body mass index (BMI) 34.0-34.9, adult: Secondary | ICD-10-CM | POA: Diagnosis not present

## 2012-07-25 DIAGNOSIS — E119 Type 2 diabetes mellitus without complications: Secondary | ICD-10-CM | POA: Diagnosis not present

## 2012-07-25 DIAGNOSIS — E1159 Type 2 diabetes mellitus with other circulatory complications: Secondary | ICD-10-CM | POA: Diagnosis not present

## 2012-07-31 DIAGNOSIS — Z719 Counseling, unspecified: Secondary | ICD-10-CM | POA: Diagnosis not present

## 2012-07-31 DIAGNOSIS — Z7182 Exercise counseling: Secondary | ICD-10-CM | POA: Diagnosis not present

## 2012-07-31 DIAGNOSIS — Z713 Dietary counseling and surveillance: Secondary | ICD-10-CM | POA: Diagnosis not present

## 2012-07-31 DIAGNOSIS — Z6834 Body mass index (BMI) 34.0-34.9, adult: Secondary | ICD-10-CM | POA: Diagnosis not present

## 2012-07-31 DIAGNOSIS — J209 Acute bronchitis, unspecified: Secondary | ICD-10-CM | POA: Diagnosis not present

## 2012-07-31 DIAGNOSIS — R05 Cough: Secondary | ICD-10-CM | POA: Diagnosis not present

## 2012-08-04 ENCOUNTER — Other Ambulatory Visit (HOSPITAL_COMMUNITY): Payer: Self-pay | Admitting: Family Medicine

## 2012-08-04 ENCOUNTER — Other Ambulatory Visit: Payer: Self-pay | Admitting: Obstetrics and Gynecology

## 2012-08-04 DIAGNOSIS — Z139 Encounter for screening, unspecified: Secondary | ICD-10-CM

## 2012-08-11 DIAGNOSIS — M5137 Other intervertebral disc degeneration, lumbosacral region: Secondary | ICD-10-CM | POA: Diagnosis not present

## 2012-08-11 DIAGNOSIS — M47812 Spondylosis without myelopathy or radiculopathy, cervical region: Secondary | ICD-10-CM | POA: Diagnosis not present

## 2012-08-11 DIAGNOSIS — M67919 Unspecified disorder of synovium and tendon, unspecified shoulder: Secondary | ICD-10-CM | POA: Diagnosis not present

## 2012-08-11 DIAGNOSIS — M47817 Spondylosis without myelopathy or radiculopathy, lumbosacral region: Secondary | ICD-10-CM | POA: Diagnosis not present

## 2012-08-12 ENCOUNTER — Ambulatory Visit (HOSPITAL_COMMUNITY)
Admission: RE | Admit: 2012-08-12 | Discharge: 2012-08-12 | Disposition: A | Discharge status: Home / Self Care | Payer: Medicare Other | Source: Ambulatory Visit | Attending: Family Medicine | Admitting: Family Medicine

## 2012-08-12 DIAGNOSIS — Z1231 Encounter for screening mammogram for malignant neoplasm of breast: Secondary | ICD-10-CM | POA: Diagnosis not present

## 2012-08-12 DIAGNOSIS — Z139 Encounter for screening, unspecified: Secondary | ICD-10-CM

## 2012-08-19 DIAGNOSIS — K219 Gastro-esophageal reflux disease without esophagitis: Secondary | ICD-10-CM | POA: Diagnosis not present

## 2012-08-19 DIAGNOSIS — Z981 Arthrodesis status: Secondary | ICD-10-CM | POA: Diagnosis not present

## 2012-08-19 DIAGNOSIS — M47817 Spondylosis without myelopathy or radiculopathy, lumbosacral region: Secondary | ICD-10-CM | POA: Diagnosis not present

## 2012-08-19 DIAGNOSIS — J449 Chronic obstructive pulmonary disease, unspecified: Secondary | ICD-10-CM | POA: Diagnosis not present

## 2012-08-19 DIAGNOSIS — M67919 Unspecified disorder of synovium and tendon, unspecified shoulder: Secondary | ICD-10-CM | POA: Diagnosis not present

## 2012-08-19 DIAGNOSIS — M5137 Other intervertebral disc degeneration, lumbosacral region: Secondary | ICD-10-CM | POA: Diagnosis not present

## 2012-08-19 DIAGNOSIS — Z885 Allergy status to narcotic agent status: Secondary | ICD-10-CM | POA: Diagnosis not present

## 2012-08-19 DIAGNOSIS — F17200 Nicotine dependence, unspecified, uncomplicated: Secondary | ICD-10-CM | POA: Diagnosis not present

## 2012-08-19 DIAGNOSIS — Z7982 Long term (current) use of aspirin: Secondary | ICD-10-CM | POA: Diagnosis not present

## 2012-08-19 DIAGNOSIS — Z86718 Personal history of other venous thrombosis and embolism: Secondary | ICD-10-CM | POA: Diagnosis not present

## 2012-08-19 DIAGNOSIS — M47812 Spondylosis without myelopathy or radiculopathy, cervical region: Secondary | ICD-10-CM | POA: Diagnosis not present

## 2012-08-19 DIAGNOSIS — E119 Type 2 diabetes mellitus without complications: Secondary | ICD-10-CM | POA: Diagnosis not present

## 2012-08-19 DIAGNOSIS — I10 Essential (primary) hypertension: Secondary | ICD-10-CM | POA: Diagnosis not present

## 2012-08-19 DIAGNOSIS — Z79899 Other long term (current) drug therapy: Secondary | ICD-10-CM | POA: Diagnosis not present

## 2012-08-19 DIAGNOSIS — Z888 Allergy status to other drugs, medicaments and biological substances status: Secondary | ICD-10-CM | POA: Diagnosis not present

## 2012-10-02 DIAGNOSIS — Z6833 Body mass index (BMI) 33.0-33.9, adult: Secondary | ICD-10-CM | POA: Diagnosis not present

## 2012-10-02 DIAGNOSIS — Z719 Counseling, unspecified: Secondary | ICD-10-CM | POA: Diagnosis not present

## 2012-10-02 DIAGNOSIS — I10 Essential (primary) hypertension: Secondary | ICD-10-CM | POA: Diagnosis not present

## 2012-10-02 DIAGNOSIS — E785 Hyperlipidemia, unspecified: Secondary | ICD-10-CM | POA: Diagnosis not present

## 2012-10-02 DIAGNOSIS — E119 Type 2 diabetes mellitus without complications: Secondary | ICD-10-CM | POA: Diagnosis not present

## 2012-10-07 DIAGNOSIS — E119 Type 2 diabetes mellitus without complications: Secondary | ICD-10-CM | POA: Diagnosis not present

## 2012-10-07 DIAGNOSIS — E1159 Type 2 diabetes mellitus with other circulatory complications: Secondary | ICD-10-CM | POA: Diagnosis not present

## 2012-10-08 DIAGNOSIS — M47817 Spondylosis without myelopathy or radiculopathy, lumbosacral region: Secondary | ICD-10-CM | POA: Diagnosis not present

## 2012-10-08 DIAGNOSIS — M5137 Other intervertebral disc degeneration, lumbosacral region: Secondary | ICD-10-CM | POA: Diagnosis not present

## 2012-10-08 DIAGNOSIS — M719 Bursopathy, unspecified: Secondary | ICD-10-CM | POA: Diagnosis not present

## 2012-10-08 DIAGNOSIS — M47812 Spondylosis without myelopathy or radiculopathy, cervical region: Secondary | ICD-10-CM | POA: Diagnosis not present

## 2012-10-29 DIAGNOSIS — M47817 Spondylosis without myelopathy or radiculopathy, lumbosacral region: Secondary | ICD-10-CM | POA: Diagnosis not present

## 2012-10-29 DIAGNOSIS — M5137 Other intervertebral disc degeneration, lumbosacral region: Secondary | ICD-10-CM | POA: Diagnosis not present

## 2012-11-03 ENCOUNTER — Encounter (HOSPITAL_COMMUNITY): Payer: Medicare Other | Attending: Internal Medicine

## 2012-11-03 DIAGNOSIS — Z09 Encounter for follow-up examination after completed treatment for conditions other than malignant neoplasm: Secondary | ICD-10-CM | POA: Insufficient documentation

## 2012-11-03 DIAGNOSIS — I82401 Acute embolism and thrombosis of unspecified deep veins of right lower extremity: Secondary | ICD-10-CM

## 2012-11-03 DIAGNOSIS — Z86718 Personal history of other venous thrombosis and embolism: Secondary | ICD-10-CM | POA: Insufficient documentation

## 2012-11-03 LAB — CBC WITH DIFFERENTIAL/PLATELET
Basophils Absolute: 0 10*3/uL (ref 0.0–0.1)
Eosinophils Relative: 2 % (ref 0–5)
HCT: 37.3 % (ref 36.0–46.0)
Lymphocytes Relative: 20 % (ref 12–46)
Lymphs Abs: 1 10*3/uL (ref 0.7–4.0)
MCH: 29.8 pg (ref 26.0–34.0)
MCV: 91.2 fL (ref 78.0–100.0)
Monocytes Absolute: 0.8 10*3/uL (ref 0.1–1.0)
RDW: 15.1 % (ref 11.5–15.5)
WBC: 4.7 10*3/uL (ref 4.0–10.5)

## 2012-11-03 NOTE — Progress Notes (Signed)
Labs drawn today for cbc/diff,homocysteine

## 2012-11-05 ENCOUNTER — Encounter (HOSPITAL_BASED_OUTPATIENT_CLINIC_OR_DEPARTMENT_OTHER): Payer: Medicare Other

## 2012-11-05 ENCOUNTER — Ambulatory Visit (HOSPITAL_COMMUNITY): Payer: Medicare Other

## 2012-11-05 VITALS — BP 138/81 | HR 91 | Temp 98.4°F | Resp 16 | Wt 215.3 lb

## 2012-11-05 DIAGNOSIS — I82401 Acute embolism and thrombosis of unspecified deep veins of right lower extremity: Secondary | ICD-10-CM

## 2012-11-05 DIAGNOSIS — Z86718 Personal history of other venous thrombosis and embolism: Secondary | ICD-10-CM | POA: Diagnosis not present

## 2012-11-05 NOTE — Patient Instructions (Signed)
.  [facility] Discharge Instructions  RECOMMENDATIONS MADE BY THE CONSULTANT AND ANY TEST RESULTS WILL BE SENT TO YOUR REFERRING PHYSICIAN.  EXAM FINDINGS BY THE PHYSICIAN TODAY AND SIGNS OR SYMPTOMS TO REPORT TO CLINIC OR PRIMARY PHYSICIAN:   MEDICATIONS PRESCRIBED:    INSTRUCTIONS GIVEN AND DISCUSSED:   SPECIAL INSTRUCTIONS/FOLLOW-UP:   Thank you for choosing [facility] to provide your oncology and hematology care.  To afford each patient quality time with our providers, please arrive at least 15 minutes before your scheduled appointment time.  With your help, our goal is to use those 15 minutes to complete the necessary work-up to ensure our physicians have the information they need to help with your evaluation and healthcare recommendations.    Effective January 1st, 2014, we ask that you re-schedule your appointment with our physicians should you arrive 10 or more minutes late for your appointment.  We strive to give you quality time with our providers, and arriving late affects you and other patients whose appointments are after yours.    Again, thank you for choosing [facility].  Our hope is that these requests will decrease the amount of time that you wait before being seen by our physicians.       _____________________________________________________________  Should you have questions after your visit to [facility], please contact our office at [phone] between the hours of 8:30 a.m. and 5:00 p.m.  Voicemails left after 4:30 p.m. will not be returned until the following business day.  For prescription refill requests, have your pharmacy contact our office with your prescription refill request.

## 2012-11-08 NOTE — Progress Notes (Signed)
Chief complaint: History of DVT.  History present illness:  Patient returns to clinic today for routine followup of a history of a right leg DVT after a traumatic events.  Previously it was reported that a hypercoagulable workup was negative.  She continues to feel well and remains asymptomatic.  She has had no further evidence of DVT.  She has no neurologic complaints.  She has a good appetite and denies weight loss.  She has no fevers or recent illnesses.  She has no chest pain or shortness of breath.  She denies any nausea, vomiting, constipation, or diarrhea.  She has no urinary complaints.  Patient feels at her baseline and offers no specific complaints today.  Active Ambulatory Problems    Diagnosis Date Noted  . ARTHRITIS, LEFT KNEE 04/21/2009  . HIGH BLOOD PRESSURE 04/21/2009  . Deep vein thrombosis (DVT) 11/13/2010  . Homocysteinemia 11/29/2010  . Abdominal pain 07/01/2012  . Dehydration 07/01/2012  . Right kidney mass 07/01/2012  . Tobacco abuse 07/01/2012  . DM type 2 (diabetes mellitus, type 2) 07/01/2012  . Chronic back pain 07/01/2012  . Abnormal gall bladder diagnostic imaging 07/01/2012  . Hypokalemia 07/03/2012   Resolved Ambulatory Problems    Diagnosis Date Noted  . No Resolved Ambulatory Problems   Past Medical History  Diagnosis Date  . Hypertension   . Diabetes mellitus   . Arthritis   . History of blood clots   . History of gout   . Left knee pain   . Kidney atrophy    Past Surgical History  Procedure Laterality Date  . Abdominal hysterectomy    . Knee surgery      rt. arthroscopic  . Tonsillectomy    . Hypertension    . Type ll diabetes    . Neck surgery    . Esophagogastroduodenoscopy N/A 07/02/2012    Procedure: ESOPHAGOGASTRODUODENOSCOPY (EGD);  Surgeon: Malissa Hippo, MD;  Location: [facility];  Service: Endoscopy;  Laterality: N/A;  Current outpatient prescriptions:acetaminophen (TYLENOL) 500 MG tablet, Take 500 mg by mouth every 6 (six)  hours as needed for pain. Takes 2 as needed for discomfort, Disp: , Rfl: ;  Ascorbic Acid (VITAMIN C) 1000 MG tablet, Take 1,000 mg by mouth daily., Disp: , Rfl: ;  aspirin 81 MG tablet, Take 81 mg by mouth every evening. , Disp: , Rfl: ;  b complex vitamins tablet, Take 1 tablet by mouth daily. Takes 2 in the morning., Disp: , Rfl:  Cholecalciferol (VITAMIN D3) 1000 UNITS CAPS, Take 1 tablet by mouth daily.  , Disp: , Rfl: ;  dexlansoprazole (DEXILANT) 60 MG capsule, Take 60 mg by mouth every evening. , Disp: , Rfl: ;  fish oil-omega-3 fatty acids 1000 MG capsule, Take 1 g by mouth daily. , Disp: , Rfl: ;  Flaxseed, Linseed, 1000 MG CAPS, Take 1 capsule by mouth daily., Disp: , Rfl:  folic acid (FOLVITE) 1 MG tablet, Take 1 tablet (1 mg total) by mouth daily., Disp: 60 tablet, Rfl: 3;  glimepiride (AMARYL) 2 MG tablet, Take 2 mg by mouth 2 (two) times daily., Disp: , Rfl: ;  lidocaine (LIDODERM) 5 %, Place 1 patch onto the skin daily. Remove & Discard patch within 12 hours or as directed by MD, Disp: , Rfl: ;  linagliptin (TRADJENTA) 5 MG TABS tablet, Take 5 mg by mouth daily.  , Disp: , Rfl:  NIFEdipine (PROCARDIA XL/ADALAT-CC) 60 MG 24 hr tablet, Take 60 mg by mouth daily.  ,  Disp: , Rfl: ;  ondansetron (ZOFRAN ODT) 4 MG disintegrating tablet, Take 1 tablet (4 mg total) by mouth every 8 (eight) hours as needed for nausea., Disp: 20 tablet, Rfl: 0;  oxyCODONE-acetaminophen (PERCOCET/ROXICET) 5-325 MG per tablet, Take 1-2 tablets by mouth every 4 (four) hours as needed for pain., Disp: 10 tablet, Rfl: 0 pioglitazone (ACTOS) 30 MG tablet, Take 30 mg by mouth daily.  , Disp: , Rfl: ;  promethazine (PHENERGAN) 25 MG tablet, Take 0.5 tablets (12.5 mg total) by mouth every 8 (eight) hours as needed for nausea., Disp: 20 tablet, Rfl: 0;  PROVENTIL HFA 108 (90 BASE) MCG/ACT inhaler, Inhale 1 puff into the lungs daily as needed. For shortness of breath/wheezing, Disp: , Rfl:  simvastatin (ZOCOR) 20 MG tablet, Take 20  mg by mouth At bedtime., Disp: , Rfl: ;  zolpidem (AMBIEN) 10 MG tablet, Take 10 mg by mouth at bedtime as needed for sleep., Disp: , Rfl: ;  sucralfate (CARAFATE) 1 G tablet, Take 1 tablet (1 g total) by mouth 4 (four) times daily., Disp: 30 tablet, Rfl: 0 next  As per HPI. Otherwise, 10 point system review was negative.  Filed Vitals:   11/05/12 1532  BP: 138/81  Pulse: 91  Temp: 98.4 F (36.9 C)  Resp: 16    General: Well-developed, well-nourished, no acute distress. Eyes: Pink conjunctiva, anicteric sclera. HEENT: Normocephalic, moist mucous membranes, clear oropharnyx. Lungs: Clear to auscultation bilaterally. Heart: Regular rate and rhythm. No rubs, murmurs, or gallops. Abdomen: Soft, nontender, nondistended. No organomegaly noted, normoactive bowel sounds. Musculoskeletal: No edema, cyanosis, or clubbing. Neuro: Alert, answering all questions appropriately. Cranial nerves grossly intact. Skin: No rashes or petechiae noted. Psych: Normal affect.  Assessment and plan:  1.  History of DVT: Patient had a right leg trauma in June of 2012.  She was found to have a DVT in July 2012 and completed 6 months of anticoagulation with Coumadin.  Reportedly, a full hypercoagulable workup was negative at the time.  She has had no further incidence of DVT.  She has no family history of DVT.  No further intervention is needed at this time. Patient was given instructions about how to prevent future DVTs with compression stockings and periodic leg movement during long trips.  She requires no further followup at this time.  If patient develops a 2nd DVT, would likely recommend lifelong anticoagulation at that time.  Patient expressed understanding and was in agreement with this plan.

## 2012-11-20 ENCOUNTER — Other Ambulatory Visit (HOSPITAL_COMMUNITY): Payer: Medicare Other

## 2012-11-24 ENCOUNTER — Ambulatory Visit (HOSPITAL_COMMUNITY): Payer: Medicare Other | Admitting: Oncology

## 2012-11-26 DIAGNOSIS — E1139 Type 2 diabetes mellitus with other diabetic ophthalmic complication: Secondary | ICD-10-CM | POA: Diagnosis not present

## 2012-12-16 DIAGNOSIS — B351 Tinea unguium: Secondary | ICD-10-CM | POA: Diagnosis not present

## 2012-12-16 DIAGNOSIS — L851 Acquired keratosis [keratoderma] palmaris et plantaris: Secondary | ICD-10-CM | POA: Diagnosis not present

## 2012-12-16 DIAGNOSIS — E1149 Type 2 diabetes mellitus with other diabetic neurological complication: Secondary | ICD-10-CM | POA: Diagnosis not present

## 2012-12-31 DIAGNOSIS — M47817 Spondylosis without myelopathy or radiculopathy, lumbosacral region: Secondary | ICD-10-CM | POA: Diagnosis not present

## 2012-12-31 DIAGNOSIS — M47812 Spondylosis without myelopathy or radiculopathy, cervical region: Secondary | ICD-10-CM | POA: Diagnosis not present

## 2012-12-31 DIAGNOSIS — M67919 Unspecified disorder of synovium and tendon, unspecified shoulder: Secondary | ICD-10-CM | POA: Diagnosis not present

## 2012-12-31 DIAGNOSIS — M5137 Other intervertebral disc degeneration, lumbosacral region: Secondary | ICD-10-CM | POA: Diagnosis not present

## 2013-01-06 ENCOUNTER — Other Ambulatory Visit (HOSPITAL_COMMUNITY): Payer: Self-pay | Admitting: Oncology

## 2013-01-06 DIAGNOSIS — R7983 Abnormal findings of blood amino-acid level: Secondary | ICD-10-CM

## 2013-01-06 MED ORDER — FOLIC ACID 1 MG PO TABS: 1.0000 mg | ORAL_TABLET | Freq: Every day | ORAL | Status: DC

## 2013-01-07 DIAGNOSIS — Z23 Encounter for immunization: Secondary | ICD-10-CM | POA: Diagnosis not present

## 2013-01-26 DIAGNOSIS — I10 Essential (primary) hypertension: Secondary | ICD-10-CM | POA: Diagnosis not present

## 2013-01-26 DIAGNOSIS — E785 Hyperlipidemia, unspecified: Secondary | ICD-10-CM | POA: Diagnosis not present

## 2013-01-26 DIAGNOSIS — Z23 Encounter for immunization: Secondary | ICD-10-CM | POA: Diagnosis not present

## 2013-01-26 DIAGNOSIS — Z6834 Body mass index (BMI) 34.0-34.9, adult: Secondary | ICD-10-CM | POA: Diagnosis not present

## 2013-01-26 DIAGNOSIS — E119 Type 2 diabetes mellitus without complications: Secondary | ICD-10-CM | POA: Diagnosis not present

## 2013-02-24 DIAGNOSIS — E1149 Type 2 diabetes mellitus with other diabetic neurological complication: Secondary | ICD-10-CM | POA: Diagnosis not present

## 2013-02-24 DIAGNOSIS — B351 Tinea unguium: Secondary | ICD-10-CM | POA: Diagnosis not present

## 2013-02-24 DIAGNOSIS — L851 Acquired keratosis [keratoderma] palmaris et plantaris: Secondary | ICD-10-CM | POA: Diagnosis not present

## 2013-04-03 DIAGNOSIS — J9801 Acute bronchospasm: Secondary | ICD-10-CM | POA: Diagnosis not present

## 2013-04-03 DIAGNOSIS — Z6833 Body mass index (BMI) 33.0-33.9, adult: Secondary | ICD-10-CM | POA: Diagnosis not present

## 2013-04-03 DIAGNOSIS — I10 Essential (primary) hypertension: Secondary | ICD-10-CM | POA: Diagnosis not present

## 2013-04-03 DIAGNOSIS — J0100 Acute maxillary sinusitis, unspecified: Secondary | ICD-10-CM | POA: Diagnosis not present

## 2013-04-03 DIAGNOSIS — B999 Unspecified infectious disease: Secondary | ICD-10-CM | POA: Diagnosis not present

## 2013-04-07 DIAGNOSIS — Z6833 Body mass index (BMI) 33.0-33.9, adult: Secondary | ICD-10-CM | POA: Diagnosis not present

## 2013-04-07 DIAGNOSIS — J209 Acute bronchitis, unspecified: Secondary | ICD-10-CM | POA: Diagnosis not present

## 2013-04-07 DIAGNOSIS — Z888 Allergy status to other drugs, medicaments and biological substances status: Secondary | ICD-10-CM | POA: Diagnosis not present

## 2013-05-05 DIAGNOSIS — E1149 Type 2 diabetes mellitus with other diabetic neurological complication: Secondary | ICD-10-CM | POA: Diagnosis not present

## 2013-05-05 DIAGNOSIS — B351 Tinea unguium: Secondary | ICD-10-CM | POA: Diagnosis not present

## 2013-05-05 DIAGNOSIS — Z23 Encounter for immunization: Secondary | ICD-10-CM | POA: Diagnosis not present

## 2013-05-05 DIAGNOSIS — L851 Acquired keratosis [keratoderma] palmaris et plantaris: Secondary | ICD-10-CM | POA: Diagnosis not present

## 2013-05-26 DIAGNOSIS — E119 Type 2 diabetes mellitus without complications: Secondary | ICD-10-CM | POA: Diagnosis not present

## 2013-06-01 DIAGNOSIS — Z23 Encounter for immunization: Secondary | ICD-10-CM | POA: Diagnosis not present

## 2013-06-01 DIAGNOSIS — E785 Hyperlipidemia, unspecified: Secondary | ICD-10-CM | POA: Diagnosis not present

## 2013-06-01 DIAGNOSIS — E119 Type 2 diabetes mellitus without complications: Secondary | ICD-10-CM | POA: Diagnosis not present

## 2013-06-01 DIAGNOSIS — I10 Essential (primary) hypertension: Secondary | ICD-10-CM | POA: Diagnosis not present

## 2013-06-01 DIAGNOSIS — Z6833 Body mass index (BMI) 33.0-33.9, adult: Secondary | ICD-10-CM | POA: Diagnosis not present

## 2013-07-01 DIAGNOSIS — N269 Renal sclerosis, unspecified: Secondary | ICD-10-CM | POA: Diagnosis not present

## 2013-07-14 DIAGNOSIS — B351 Tinea unguium: Secondary | ICD-10-CM | POA: Diagnosis not present

## 2013-07-14 DIAGNOSIS — E1149 Type 2 diabetes mellitus with other diabetic neurological complication: Secondary | ICD-10-CM | POA: Diagnosis not present

## 2013-07-14 DIAGNOSIS — L851 Acquired keratosis [keratoderma] palmaris et plantaris: Secondary | ICD-10-CM | POA: Diagnosis not present

## 2013-09-03 DIAGNOSIS — Z23 Encounter for immunization: Secondary | ICD-10-CM | POA: Diagnosis not present

## 2013-09-03 DIAGNOSIS — N182 Chronic kidney disease, stage 2 (mild): Secondary | ICD-10-CM | POA: Diagnosis not present

## 2013-09-07 ENCOUNTER — Other Ambulatory Visit (HOSPITAL_COMMUNITY): Payer: Self-pay | Admitting: Oncology

## 2013-09-29 DIAGNOSIS — B351 Tinea unguium: Secondary | ICD-10-CM | POA: Diagnosis not present

## 2013-09-29 DIAGNOSIS — E1149 Type 2 diabetes mellitus with other diabetic neurological complication: Secondary | ICD-10-CM | POA: Diagnosis not present

## 2013-09-29 DIAGNOSIS — L851 Acquired keratosis [keratoderma] palmaris et plantaris: Secondary | ICD-10-CM | POA: Diagnosis not present

## 2013-10-05 DIAGNOSIS — E119 Type 2 diabetes mellitus without complications: Secondary | ICD-10-CM | POA: Diagnosis not present

## 2013-10-05 DIAGNOSIS — E785 Hyperlipidemia, unspecified: Secondary | ICD-10-CM | POA: Diagnosis not present

## 2013-10-05 DIAGNOSIS — I10 Essential (primary) hypertension: Secondary | ICD-10-CM | POA: Diagnosis not present

## 2013-10-05 DIAGNOSIS — Z6833 Body mass index (BMI) 33.0-33.9, adult: Secondary | ICD-10-CM | POA: Diagnosis not present

## 2013-11-04 ENCOUNTER — Other Ambulatory Visit (HOSPITAL_COMMUNITY): Payer: Self-pay | Admitting: Family Medicine

## 2013-11-04 ENCOUNTER — Ambulatory Visit (HOSPITAL_COMMUNITY)
Admission: RE | Admit: 2013-11-04 | Discharge: 2013-11-04 | Disposition: A | Discharge status: Home / Self Care | Payer: Medicare Other | Source: Ambulatory Visit | Attending: Family Medicine | Admitting: Family Medicine

## 2013-11-04 DIAGNOSIS — M5030 Other cervical disc degeneration, unspecified cervical region: Secondary | ICD-10-CM | POA: Diagnosis not present

## 2013-11-04 DIAGNOSIS — M5020 Other cervical disc displacement, unspecified cervical region: Secondary | ICD-10-CM

## 2013-11-04 DIAGNOSIS — M542 Cervicalgia: Secondary | ICD-10-CM | POA: Insufficient documentation

## 2013-11-04 DIAGNOSIS — Z6833 Body mass index (BMI) 33.0-33.9, adult: Secondary | ICD-10-CM | POA: Diagnosis not present

## 2013-11-13 DIAGNOSIS — S92309A Fracture of unspecified metatarsal bone(s), unspecified foot, initial encounter for closed fracture: Secondary | ICD-10-CM | POA: Diagnosis not present

## 2013-11-13 DIAGNOSIS — M79609 Pain in unspecified limb: Secondary | ICD-10-CM | POA: Diagnosis not present

## 2013-12-04 DIAGNOSIS — S92309A Fracture of unspecified metatarsal bone(s), unspecified foot, initial encounter for closed fracture: Secondary | ICD-10-CM | POA: Diagnosis not present

## 2013-12-25 DIAGNOSIS — S92309A Fracture of unspecified metatarsal bone(s), unspecified foot, initial encounter for closed fracture: Secondary | ICD-10-CM | POA: Diagnosis not present

## 2013-12-29 DIAGNOSIS — E1149 Type 2 diabetes mellitus with other diabetic neurological complication: Secondary | ICD-10-CM | POA: Diagnosis not present

## 2013-12-29 DIAGNOSIS — B351 Tinea unguium: Secondary | ICD-10-CM | POA: Diagnosis not present

## 2013-12-29 DIAGNOSIS — L851 Acquired keratosis [keratoderma] palmaris et plantaris: Secondary | ICD-10-CM | POA: Diagnosis not present

## 2013-12-31 DIAGNOSIS — Z23 Encounter for immunization: Secondary | ICD-10-CM | POA: Diagnosis not present

## 2014-01-15 DIAGNOSIS — S92325D Nondisplaced fracture of second metatarsal bone, left foot, subsequent encounter for fracture with routine healing: Secondary | ICD-10-CM | POA: Diagnosis not present

## 2014-02-09 ENCOUNTER — Other Ambulatory Visit (HOSPITAL_COMMUNITY): Payer: Self-pay | Admitting: Family Medicine

## 2014-02-09 DIAGNOSIS — I10 Essential (primary) hypertension: Secondary | ICD-10-CM | POA: Diagnosis not present

## 2014-02-09 DIAGNOSIS — E119 Type 2 diabetes mellitus without complications: Secondary | ICD-10-CM | POA: Diagnosis not present

## 2014-02-09 DIAGNOSIS — E782 Mixed hyperlipidemia: Secondary | ICD-10-CM | POA: Diagnosis not present

## 2014-02-09 DIAGNOSIS — Z23 Encounter for immunization: Secondary | ICD-10-CM | POA: Diagnosis not present

## 2014-02-09 DIAGNOSIS — Z6834 Body mass index (BMI) 34.0-34.9, adult: Secondary | ICD-10-CM | POA: Diagnosis not present

## 2014-02-10 ENCOUNTER — Other Ambulatory Visit (HOSPITAL_COMMUNITY): Payer: Self-pay | Admitting: Family Medicine

## 2014-02-10 DIAGNOSIS — E2839 Other primary ovarian failure: Secondary | ICD-10-CM

## 2014-02-10 DIAGNOSIS — M8580 Other specified disorders of bone density and structure, unspecified site: Secondary | ICD-10-CM

## 2014-02-15 ENCOUNTER — Other Ambulatory Visit (HOSPITAL_COMMUNITY): Payer: Medicare Other

## 2014-02-17 ENCOUNTER — Ambulatory Visit (HOSPITAL_COMMUNITY)
Admission: RE | Admit: 2014-02-17 | Discharge: 2014-02-17 | Disposition: A | Discharge status: Home / Self Care | Payer: Medicare Other | Source: Ambulatory Visit | Attending: Family Medicine | Admitting: Family Medicine

## 2014-02-17 DIAGNOSIS — F17210 Nicotine dependence, cigarettes, uncomplicated: Secondary | ICD-10-CM | POA: Insufficient documentation

## 2014-02-17 DIAGNOSIS — M810 Age-related osteoporosis without current pathological fracture: Secondary | ICD-10-CM | POA: Insufficient documentation

## 2014-02-17 DIAGNOSIS — M8580 Other specified disorders of bone density and structure, unspecified site: Secondary | ICD-10-CM

## 2014-02-17 DIAGNOSIS — Z780 Asymptomatic menopausal state: Secondary | ICD-10-CM | POA: Diagnosis not present

## 2014-02-17 DIAGNOSIS — M859 Disorder of bone density and structure, unspecified: Secondary | ICD-10-CM | POA: Diagnosis not present

## 2014-02-17 DIAGNOSIS — M8589 Other specified disorders of bone density and structure, multiple sites: Secondary | ICD-10-CM | POA: Diagnosis not present

## 2014-02-17 DIAGNOSIS — E2839 Other primary ovarian failure: Secondary | ICD-10-CM | POA: Diagnosis not present

## 2014-03-09 DIAGNOSIS — B351 Tinea unguium: Secondary | ICD-10-CM | POA: Diagnosis not present

## 2014-03-09 DIAGNOSIS — L851 Acquired keratosis [keratoderma] palmaris et plantaris: Secondary | ICD-10-CM | POA: Diagnosis not present

## 2014-03-09 DIAGNOSIS — E1342 Other specified diabetes mellitus with diabetic polyneuropathy: Secondary | ICD-10-CM | POA: Diagnosis not present

## 2014-03-28 IMAGING — CR DG ABDOMEN ACUTE W/ 1V CHEST
3 series · 3 of 3 positions shown · non-contrast
Comparison: 06/29/2012

CLINICAL DATA: Nominal pain

ACUTE ABDOMEN SERIES (ABDOMEN 2 VIEW & CHEST 1 VIEW)

[view not recorded (1 of 3)]
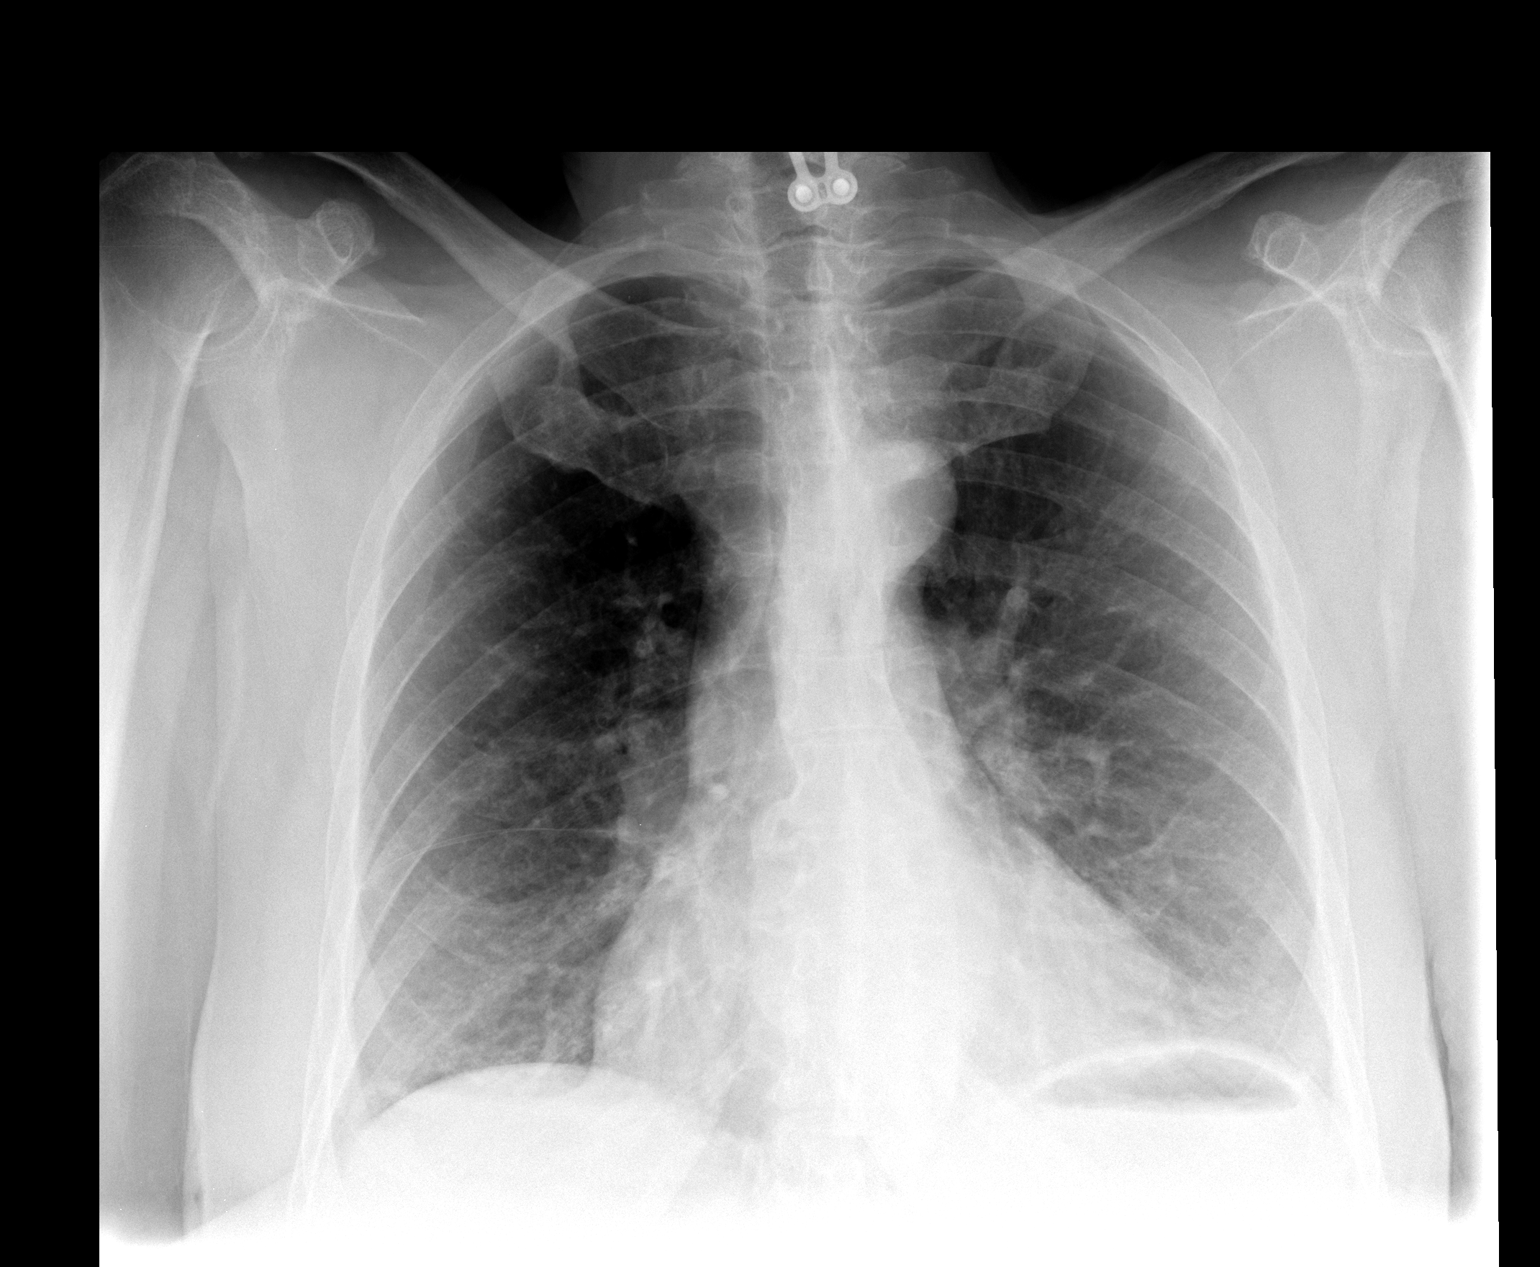

[view not recorded (2 of 3)]
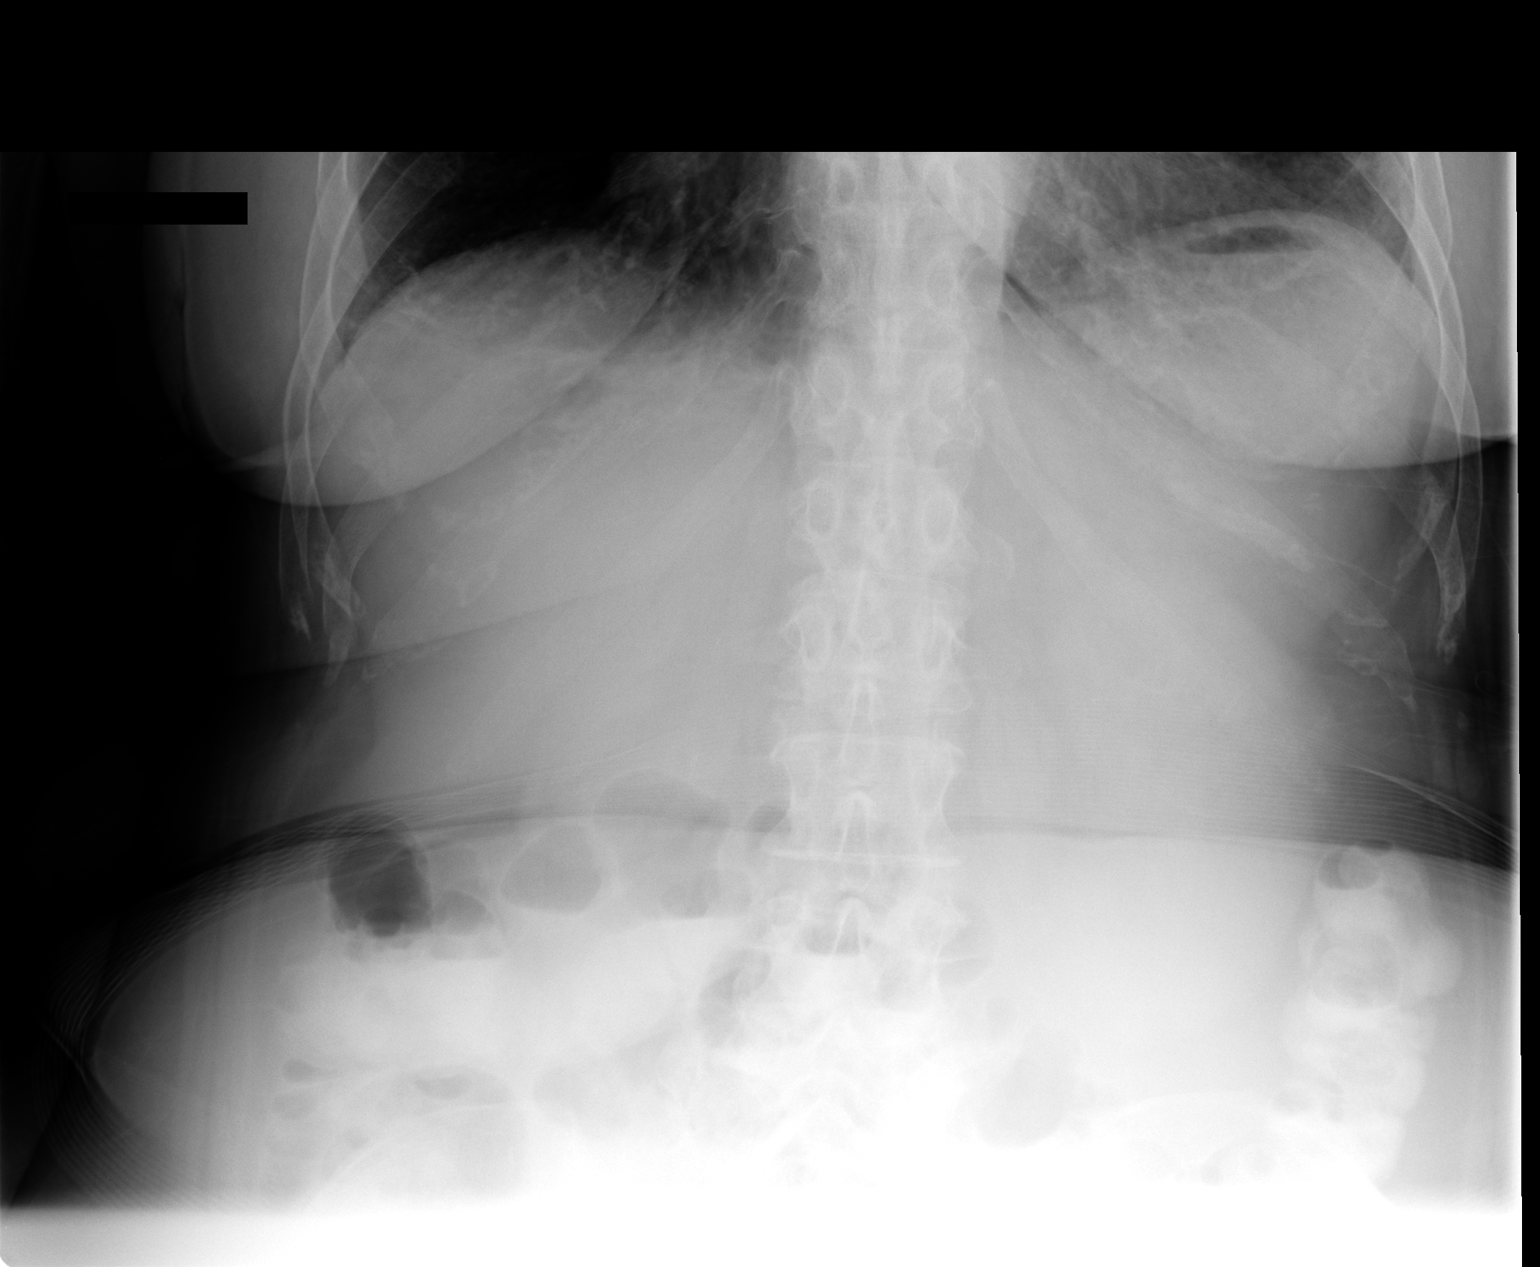

[view not recorded (3 of 3)]
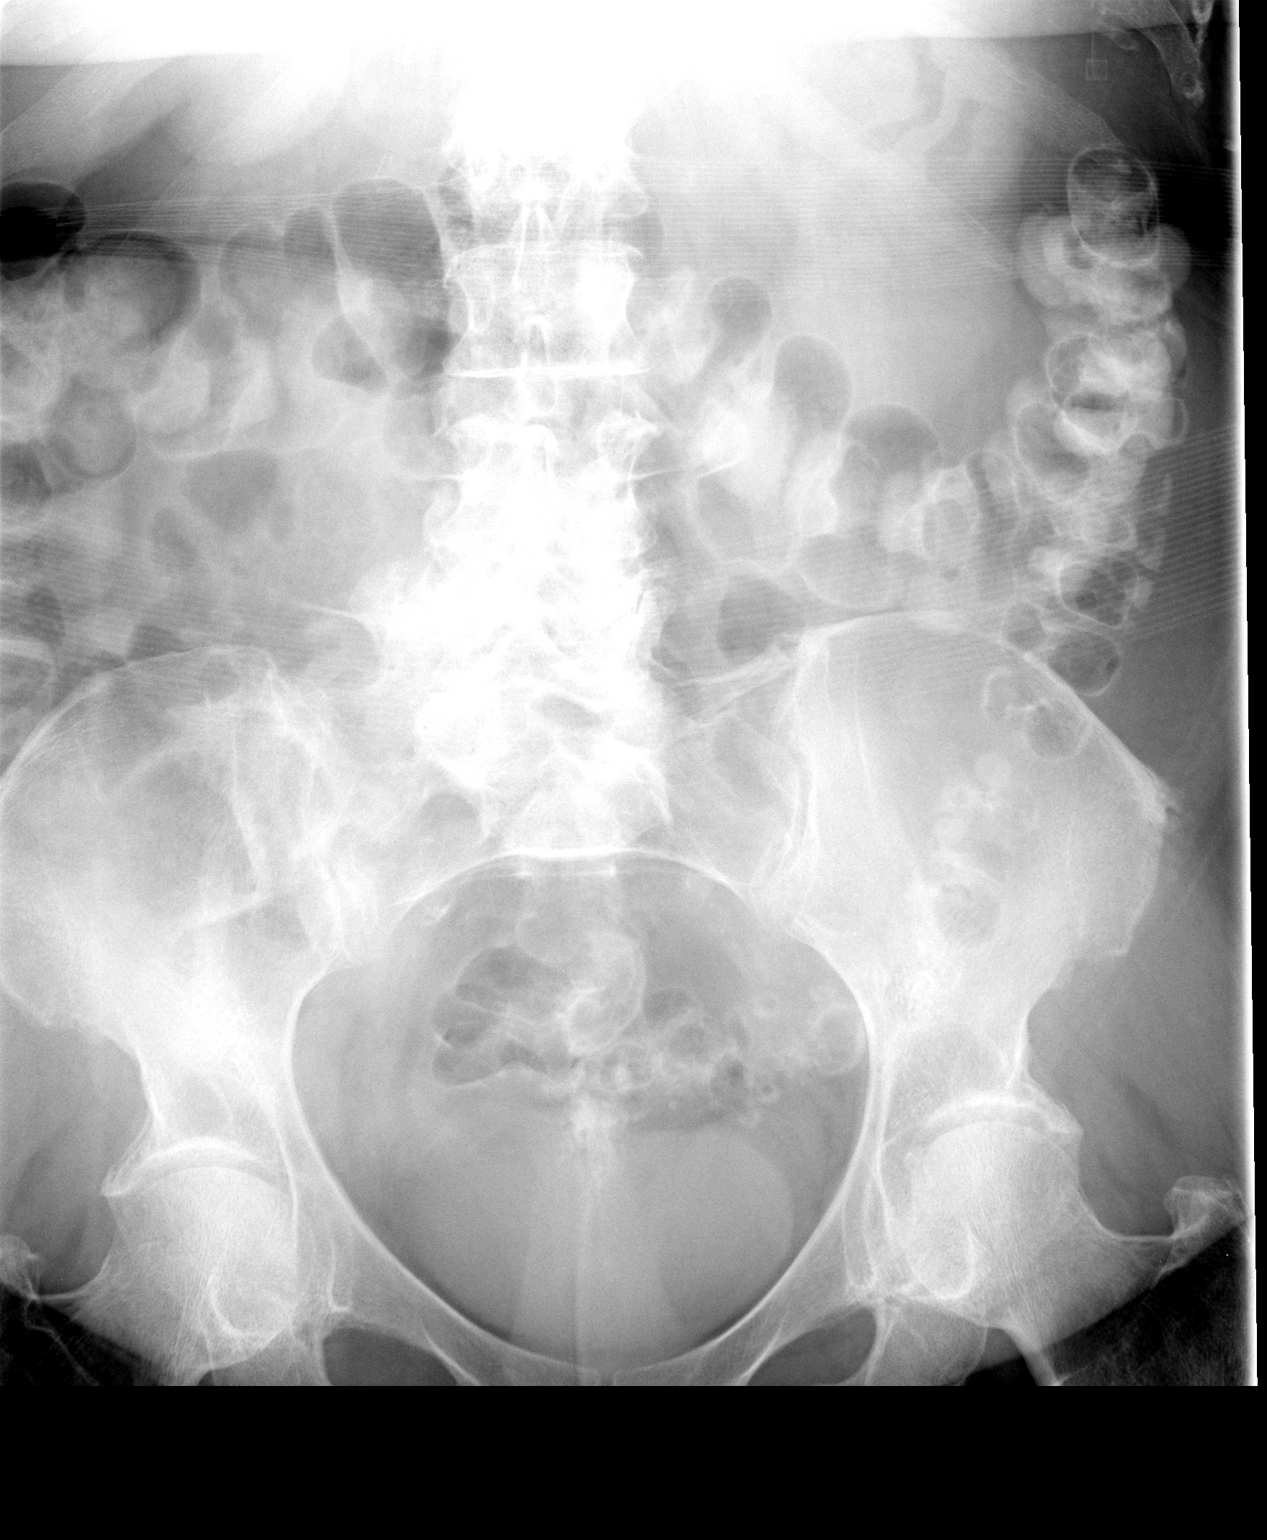

[3 of 3 positions shown; findings below may reference images not displayed]

FINDINGS: Mild cardiomegaly.  Subsegmental atelectasis at the left
base.  Upper lungs clear.  There is no free intraperitoneal gas.
Nonspecific air fluid levels in the colon.  No disproportionate
dilatation of bowel.
IMPRESSION: Nonobstructive bowel gas pattern.  Subsegmental atelectasis at the
left base.

## 2014-03-29 IMAGING — US US ABDOMEN LIMITED
1 series · 14 of 25 positions shown · non-contrast
Comparison: CT 06/29/2012.

CLINICAL DATA: History of nausea and right upper quadrant
abdominal pain.

LIMITED ABDOMINAL ULTRASOUND - RIGHT UPPER QUADRANT

[Series 1: us abdomen limited · 0.23mm/px · 14 of 88 slices shown]
[im 1/88]
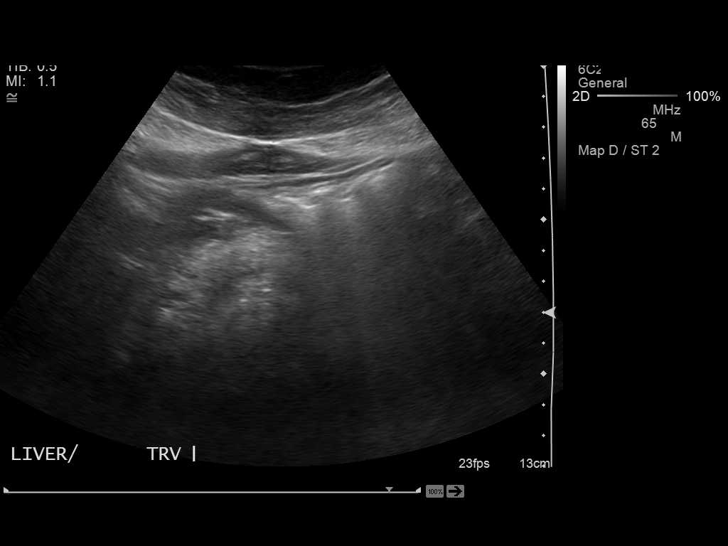
[im 8/88]
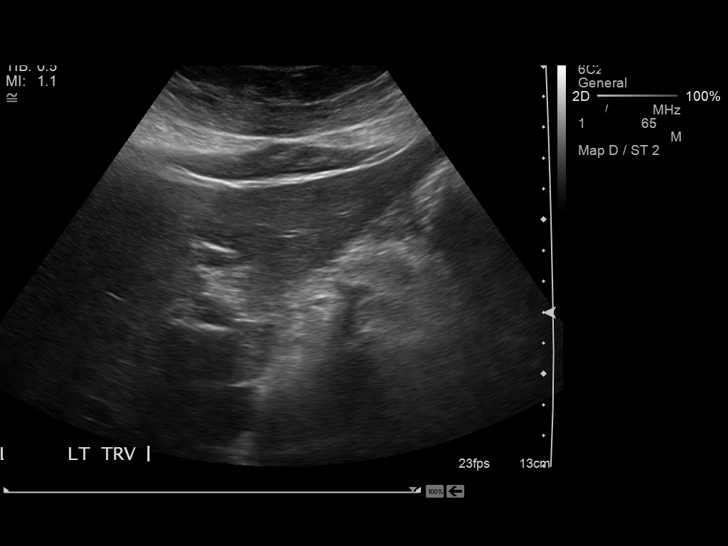
[im 15/88]
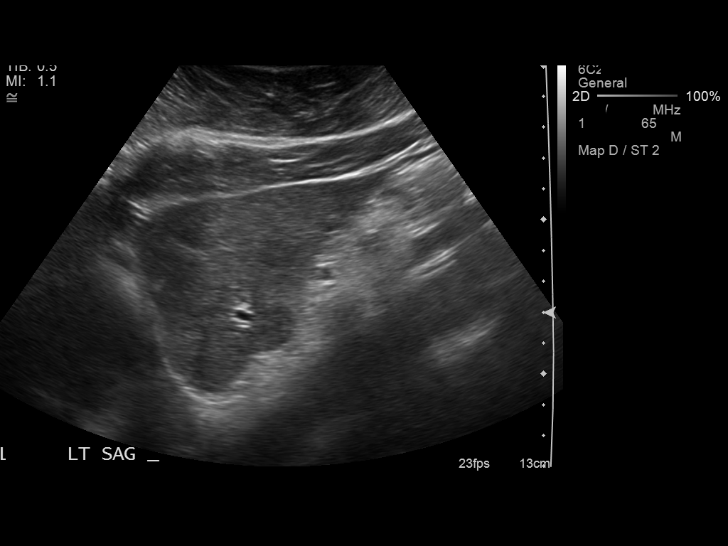
[im 22/88]
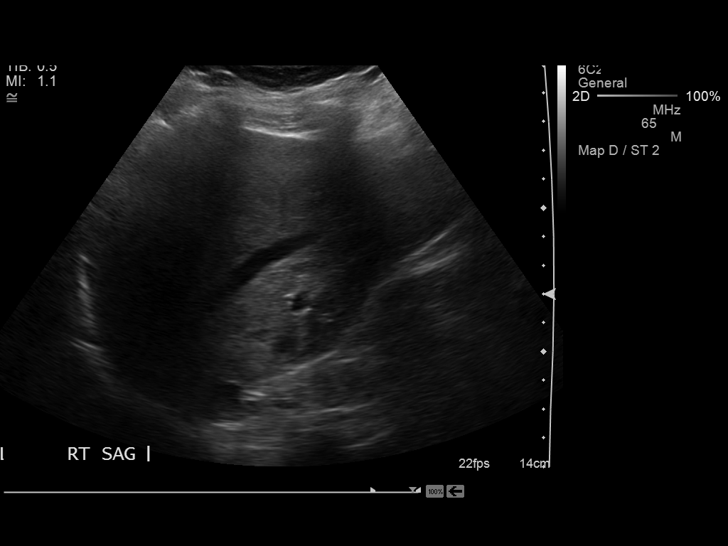
[im 30/88]
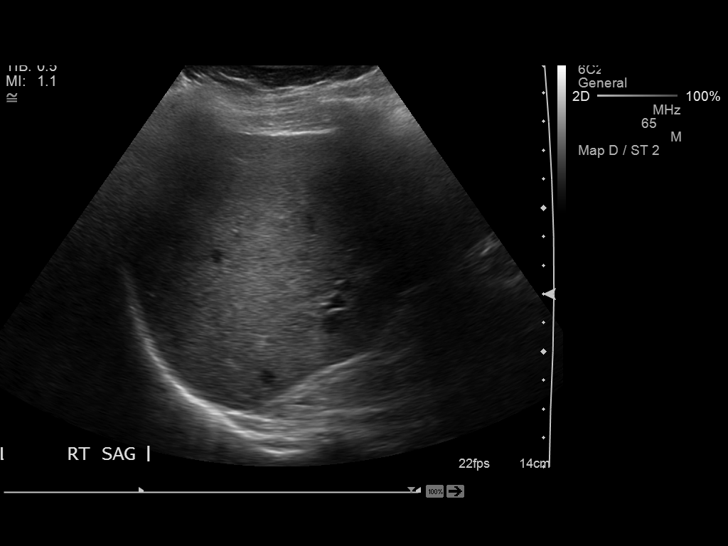
[im 33/88]
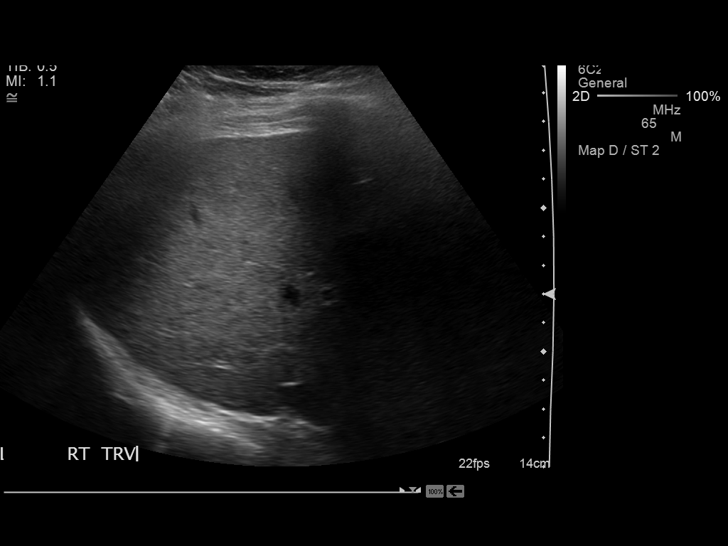
[im 40/88]
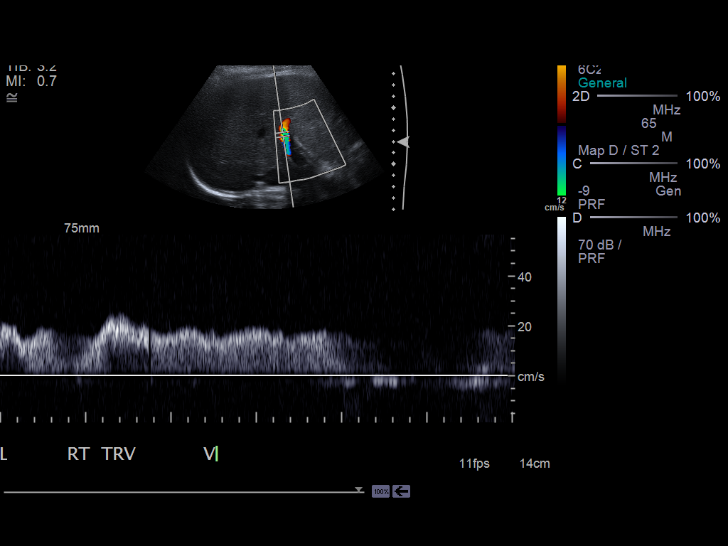
[im 48/88]
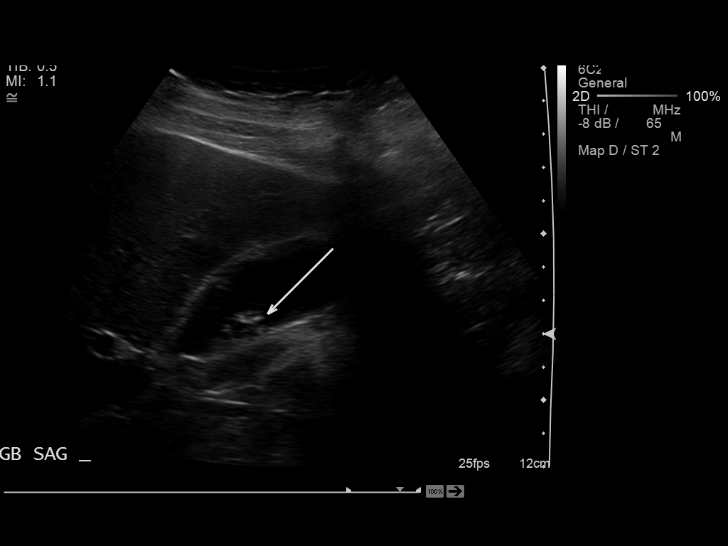
[im 55/88]
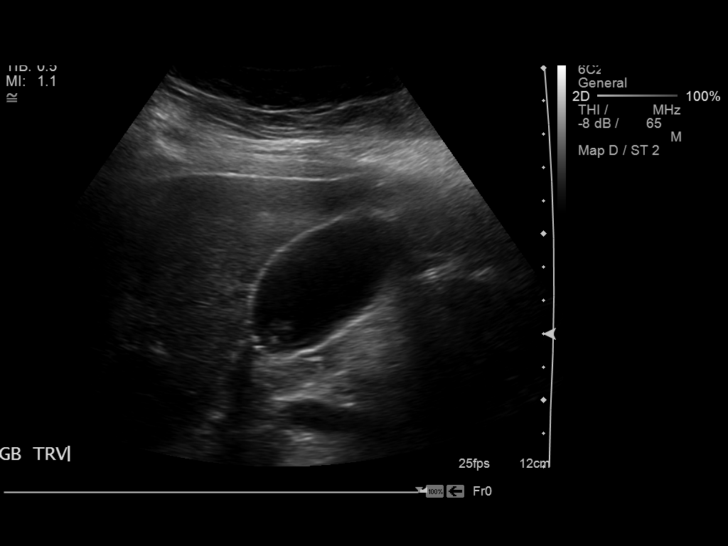
[im 59/88]
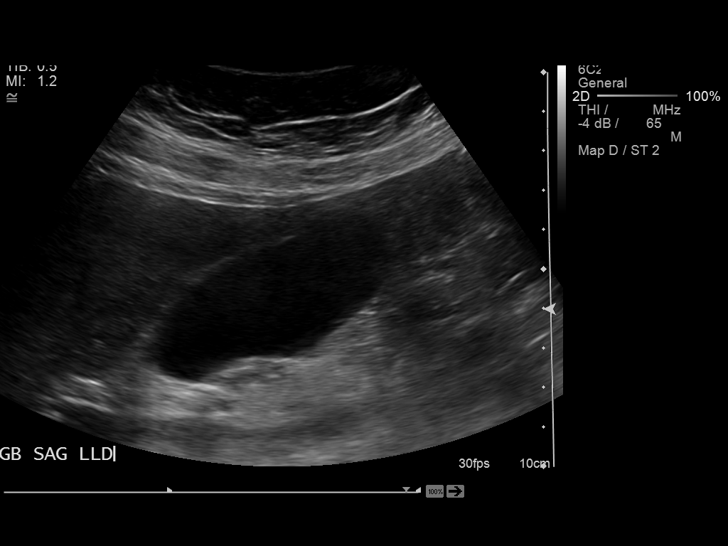
[im 66/88]
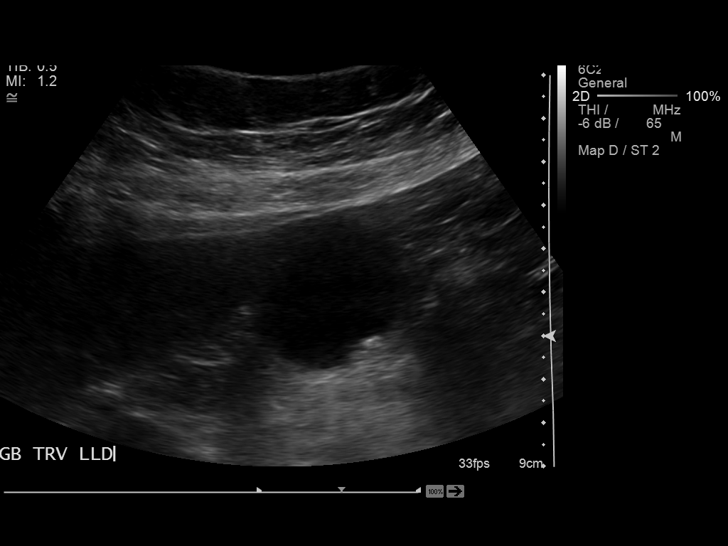
[im 73/88]
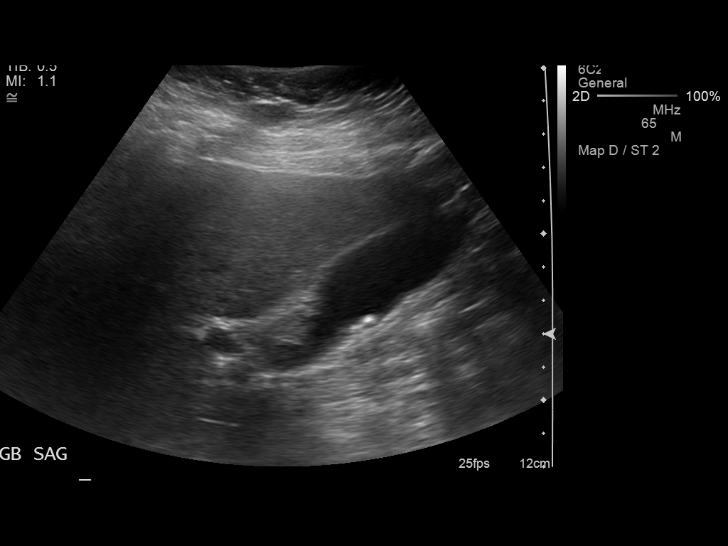
[im 80/88]
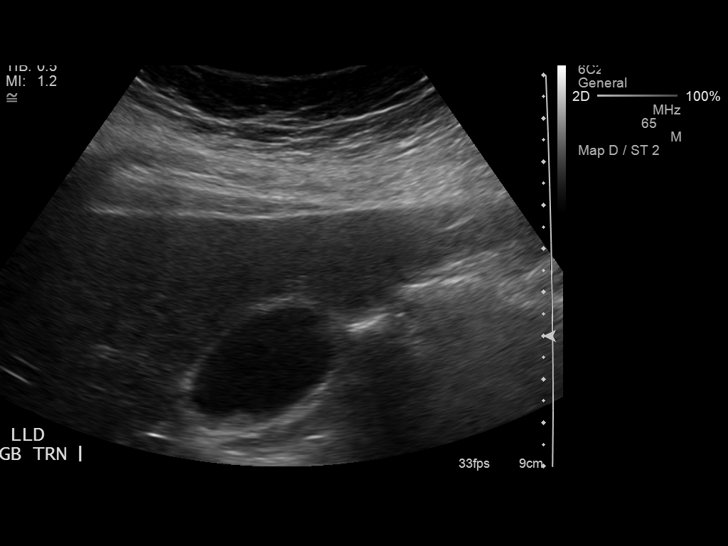
[im 88/88]
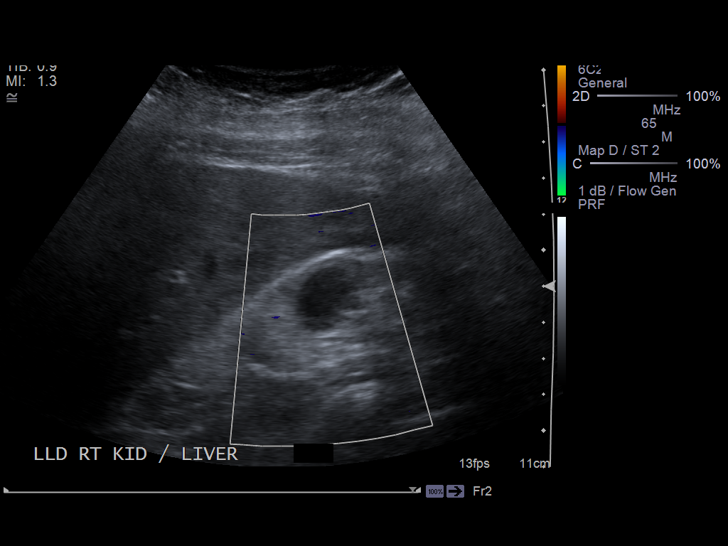

[14 of 25 positions shown; findings below may reference images not displayed]

FINDINGS: Gallbladder: There is cholelithiasis. Multiple shadowing gallstones
are seen within the gallbladder. The largest calculus has a
greatest diameter of 7 mm.. No gallbladder wall thickening or
pericholecystic fluid. Negative sonographic Murphy's sign according
to the ultrasound technologist. Gallbladder wall thickness is
mm.

CBD: Normal in caliber measuring approximately 5.7 mm. No
choledocholithiasis is evident.

Liver:  Normal size and echotexture without focal parenchymal
abnormality. Portal vein is patent with hepatopetal flow.

Right Kidney:  No hydronephrosis.  Simple cyst of right kidney.
The cyst measures 1.9 ml of point of normal place 6 cm.
IMPRESSION: Cholelithiasis.  Multiple gallstones are seen in the gallbladder.
No gallbladder wall thickening.  No pericholecystic fluid.
Negative sonographic Murphy's sign.

No bile duct or hepatic abnormality was identified.

Simple cyst of right kidney.

## 2014-03-30 DIAGNOSIS — Z23 Encounter for immunization: Secondary | ICD-10-CM | POA: Diagnosis not present

## 2014-04-30 ENCOUNTER — Other Ambulatory Visit: Payer: Self-pay | Admitting: Obstetrics and Gynecology

## 2014-04-30 DIAGNOSIS — Z1231 Encounter for screening mammogram for malignant neoplasm of breast: Secondary | ICD-10-CM

## 2014-05-05 ENCOUNTER — Ambulatory Visit (HOSPITAL_COMMUNITY)
Admission: RE | Admit: 2014-05-05 | Discharge: 2014-05-05 | Disposition: A | Discharge status: Home / Self Care | Payer: Medicare Other | Source: Ambulatory Visit | Attending: Obstetrics and Gynecology | Admitting: Obstetrics and Gynecology

## 2014-05-05 DIAGNOSIS — Z1231 Encounter for screening mammogram for malignant neoplasm of breast: Secondary | ICD-10-CM | POA: Diagnosis not present

## 2014-05-12 ENCOUNTER — Ambulatory Visit (INDEPENDENT_AMBULATORY_CARE_PROVIDER_SITE_OTHER): Payer: Medicare Other | Admitting: Obstetrics and Gynecology

## 2014-05-12 ENCOUNTER — Encounter: Payer: Self-pay | Admitting: Obstetrics and Gynecology

## 2014-05-12 VITALS — BP 120/72 | Ht 70.0 in | Wt 220.5 lb

## 2014-05-12 DIAGNOSIS — Z01419 Encounter for gynecological examination (general) (routine) without abnormal findings: Secondary | ICD-10-CM

## 2014-05-12 DIAGNOSIS — Z0000 Encounter for general adult medical examination without abnormal findings: Secondary | ICD-10-CM | POA: Insufficient documentation

## 2014-05-12 NOTE — Progress Notes (Addendum)
Patient ID: [name], female   DOB: 1945/10/27, 69 y.o.   MRN: [id number]  This chart was scribed for Jonnie Kind, MD by Donato Schultz, ED Scribe. This patient was seen in Room 1 and the patient's care was started at 2:47 PM.   Assessment:   Annual Gyn Exam   Plan:   1. pap smear not  Done as pt is s/p hyst with removal of cx., no pap required for future visits 2. return annually or prn 3    Annual mammogram advised Subjective:  [name] is a 69 y.o. female No obstetric history on file. who presents for annual exam. No LMP recorded. Patient has had a hysterectomy. The patient has no complaints today.  She normally goes to the bathroom once or twice a night but there are some nights where she goes more frequently.  She is able to void her bladder entirely.  Dr. Luna Glasgow follows the arthritis in her knees and will sometimes give her a cortisone injection or draw fluid from her knees when the pain is severe.  Her last colonoscopy was in 2014.    The following portions of the patient's history were reviewed and updated as appropriate: allergies, current medications, past family history, past medical history, past social history, past surgical history and problem list. Past Medical History  Diagnosis Date  . Hypertension   . Diabetes mellitus     non insulin  . Arthritis   . History of blood clots   . Homocysteinemia 11/29/2010  . History of gout     right ring finger  . Left knee pain   . Kidney atrophy     rt.    Past Surgical History  Procedure Laterality Date  . Abdominal hysterectomy    . Knee surgery      rt. arthroscopic  . Tonsillectomy    . Hypertension    . Type ll diabetes    . Neck surgery    . Esophagogastroduodenoscopy N/A 07/02/2012    Procedure: ESOPHAGOGASTRODUODENOSCOPY (EGD);  Surgeon: Rogene Houston, MD;  Location: [facility];  Service: Endoscopy;  Laterality: N/A;     Current outpatient prescriptions:  .  acetaminophen (TYLENOL) 500 MG  tablet, Take 500 mg by mouth every 6 (six) hours as needed for pain. Takes 2 as needed for discomfort, Disp: , Rfl:  .  Ascorbic Acid (VITAMIN C) 1000 MG tablet, Take 1,000 mg by mouth daily., Disp: , Rfl:  .  aspirin 81 MG tablet, Take 81 mg by mouth every evening. , Disp: , Rfl:  .  b complex vitamins tablet, Take 1 tablet by mouth daily. Takes 2 in the morning., Disp: , Rfl:  .  Cholecalciferol (VITAMIN D3) 1000 UNITS CAPS, Take 1 tablet by mouth daily.  , Disp: , Rfl:  .  fish oil-omega-3 fatty acids 1000 MG capsule, Take 1 g by mouth daily. , Disp: , Rfl:  .  Flaxseed, Linseed, 1000 MG CAPS, Take 1 capsule by mouth daily., Disp: , Rfl:  .  folic acid (FOLVITE) 1 MG tablet, TAKE ONE (1) TABLET BY MOUTH EVERY DAY, Disp: 60 tablet, Rfl: 3 .  glimepiride (AMARYL) 2 MG tablet, Take 2 mg by mouth 2 (two) times daily., Disp: , Rfl:  .  lidocaine (LIDODERM) 5 %, Place 1 patch onto the skin daily. Remove & Discard patch within 12 hours or as directed by MD, Disp: , Rfl:  .  linagliptin (TRADJENTA) 5 MG TABS tablet, Take  5 mg by mouth daily.  , Disp: , Rfl:  .  metFORMIN (GLUCOPHAGE) 500 MG tablet, Take 500 mg by mouth 2 (two) times daily with a meal., Disp: , Rfl:  .  NIFEdipine (PROCARDIA XL/ADALAT-CC) 60 MG 24 hr tablet, Take 60 mg by mouth daily.  , Disp: , Rfl:  .  pantoprazole (PROTONIX) 40 MG tablet, Take 40 mg by mouth daily., Disp: , Rfl:  .  pioglitazone (ACTOS) 30 MG tablet, Take 30 mg by mouth daily.  , Disp: , Rfl:  .  simvastatin (ZOCOR) 20 MG tablet, Take 20 mg by mouth At bedtime., Disp: , Rfl:  .  zolpidem (AMBIEN) 10 MG tablet, Take 10 mg by mouth at bedtime as needed for sleep., Disp: , Rfl:   Review of Systems Constitutional: negative Gastrointestinal: negative Genitourinary: negative rare nocturia x 2-4  Objective:  BP 120/72 mmHg  Ht 5\' 10"  (1.778 m)  Wt 220 lb 8 oz (100.018 kg)  BMI 31.64 kg/m2   BMI: Body mass index is 31.64 kg/(m^2).  General Appearance: Alert,  appropriate appearance for age. No acute distress HEENT: Grossly normal Neck / Thyroid:  Cardiovascular: RRR; normal S1, S2, no murmur Lungs: CTA bilaterally Back: No CVAT Breast Exam: No masses or nodes.No dimpling, nipple retraction or discharge. Gastrointestinal: Soft, non-tender, no masses or organomegaly Pelvic Exam: External genitalia: normal general appearance Vaginal: normal without tenderness, induration or masses, atrophic tissues appropriate for age, good support of vaginal cuff, bladder well supported Cervix: removed surgically Adnexa: no adnexal masses Uterus: removed surgically Rectovaginal: normal rectal, no masses, good support Lymphatic Exam: Non-palpable nodes in neck, clavicular, axillary, or inguinal regions Skin: no rash or abnormalities Neurologic: Normal gait and speech, no tremor  Psychiatric: Alert and oriented, appropriate affect.  Urinalysis:Not done Hemoccult: Negative  Mallory Shirk. MD Pgr [phone] 2:47 PM

## 2014-05-12 NOTE — Progress Notes (Signed)
Patient ID: [name], female   DOB: 1945-07-09, 69 y.o.   MRN: [id number] Pt here toady for annual exam. Pt denies any problems or concerns at this time.

## 2014-05-18 DIAGNOSIS — L851 Acquired keratosis [keratoderma] palmaris et plantaris: Secondary | ICD-10-CM | POA: Diagnosis not present

## 2014-05-18 DIAGNOSIS — B351 Tinea unguium: Secondary | ICD-10-CM | POA: Diagnosis not present

## 2014-05-18 DIAGNOSIS — E1342 Other specified diabetes mellitus with diabetic polyneuropathy: Secondary | ICD-10-CM | POA: Diagnosis not present

## 2014-05-19 DIAGNOSIS — E119 Type 2 diabetes mellitus without complications: Secondary | ICD-10-CM | POA: Diagnosis not present

## 2014-05-19 DIAGNOSIS — E11319 Type 2 diabetes mellitus with unspecified diabetic retinopathy without macular edema: Secondary | ICD-10-CM | POA: Diagnosis not present

## 2014-06-18 DIAGNOSIS — E6609 Other obesity due to excess calories: Secondary | ICD-10-CM | POA: Diagnosis not present

## 2014-06-18 DIAGNOSIS — Z719 Counseling, unspecified: Secondary | ICD-10-CM | POA: Diagnosis not present

## 2014-06-18 DIAGNOSIS — I10 Essential (primary) hypertension: Secondary | ICD-10-CM | POA: Diagnosis not present

## 2014-06-18 DIAGNOSIS — E782 Mixed hyperlipidemia: Secondary | ICD-10-CM | POA: Diagnosis not present

## 2014-06-18 DIAGNOSIS — E119 Type 2 diabetes mellitus without complications: Secondary | ICD-10-CM | POA: Diagnosis not present

## 2014-06-18 DIAGNOSIS — Z6834 Body mass index (BMI) 34.0-34.9, adult: Secondary | ICD-10-CM | POA: Diagnosis not present

## 2014-06-30 DIAGNOSIS — N261 Atrophy of kidney (terminal): Secondary | ICD-10-CM | POA: Diagnosis not present

## 2014-07-07 ENCOUNTER — Telehealth: Payer: Self-pay | Admitting: Cardiovascular Disease

## 2014-07-07 DIAGNOSIS — N261 Atrophy of kidney (terminal): Secondary | ICD-10-CM | POA: Diagnosis not present

## 2014-07-07 DIAGNOSIS — R32 Unspecified urinary incontinence: Secondary | ICD-10-CM | POA: Diagnosis not present

## 2014-07-07 DIAGNOSIS — N3946 Mixed incontinence: Secondary | ICD-10-CM | POA: Diagnosis not present

## 2014-07-07 NOTE — Telephone Encounter (Signed)
Received records from [facility] for appointment with Dr Sallyanne Kuster on 08/13/14.  Records given to [facility] (medical records) for Dr Croitoru's schedule on 08/13/14. lp

## 2014-07-27 ENCOUNTER — Encounter: Payer: Self-pay | Admitting: *Deleted

## 2014-07-27 DIAGNOSIS — B351 Tinea unguium: Secondary | ICD-10-CM | POA: Diagnosis not present

## 2014-07-27 DIAGNOSIS — L851 Acquired keratosis [keratoderma] palmaris et plantaris: Secondary | ICD-10-CM | POA: Diagnosis not present

## 2014-07-27 DIAGNOSIS — E1342 Other specified diabetes mellitus with diabetic polyneuropathy: Secondary | ICD-10-CM | POA: Diagnosis not present

## 2014-08-13 ENCOUNTER — Ambulatory Visit (INDEPENDENT_AMBULATORY_CARE_PROVIDER_SITE_OTHER): Payer: Medicare Other | Admitting: Cardiovascular Disease

## 2014-08-13 ENCOUNTER — Encounter: Payer: Self-pay | Admitting: Cardiovascular Disease

## 2014-08-13 VITALS — BP 130/66 | HR 94 | Resp 16 | Ht 70.0 in | Wt 221.1 lb

## 2014-08-13 DIAGNOSIS — R079 Chest pain, unspecified: Secondary | ICD-10-CM | POA: Insufficient documentation

## 2014-08-13 DIAGNOSIS — R0602 Shortness of breath: Secondary | ICD-10-CM | POA: Diagnosis not present

## 2014-08-13 MED ORDER — ATENOLOL 25 MG PO TABS: 25.0000 mg | ORAL_TABLET | Freq: Every day | ORAL | Status: DC

## 2014-08-13 NOTE — Patient Instructions (Signed)
START Atenolol 25mg  daily.  Your physician has requested that you have a lexiscan myoview. For further information please visit [email or web address]. Please follow instruction sheet, as given.  Dr. Sallyanne Kuster recommends that you schedule a follow-up appointment in: Next available.

## 2014-08-13 NOTE — Progress Notes (Signed)
Patient ID: [name], female   DOB: 1945-07-11, 69 y.o.   MRN: [id number]     Cardiology Office Note   Date:  08/13/2014   ID:  [name], DOB 1945-11-17, MRN [id number]  PCP:  Purvis Kilts, MD  Cardiologist:   Sanda Klein, MD   Chief Complaint  Patient presents with  . New Evaluation    c/o occas. left sided chest pain, left arm numbness and left neck discomfort - not at the same time with and without activity.  Occas. SOB.      History of Present Illness: [name] is a 68 y.o. female who presents for evaluation of chest pain. She describes several types so discomfort. She has pain in her left cervical area that is sharp and occurs at rest and is often relieved by massaging her stretching. It also associates pain and numbness in her left upper extremity. She appears to have a separate discomfort of a dull aching pain underneath her left breast that usually occurs during light activity such as household chores. This is relieved after roughly 5 minutes of sitting down or lying in bed. She denies exertional dyspnea, but activity is limited by a variety of orthopedic problems including lumbar spine and knee arthritis. She has had previous cervical spine surgery.  Her past medical history significant for type 2 diabetes mellitus and hypertension, both of which have been ongoing for at least 20 years. She has a history of a hypotrophic right kidney associated with ultrasound evidence of renal artery obstruction. She has normal renal function. She has treated hyperlipidemia on simvastatin with LDL of 81 and compensated diabetes with a hemoglobin A1c at 6.5%. She does not have a known history of coronary artery disease, peripheral artery disease (other than the renal artery obstruction), stroke, TIA or congestive heart failure or arrhythmia.  In the remote past she had a deep venous thrombosis of the lower extremity following trauma. She had an abnormal nuclear stress test in  2008 describing very mild distal anterolateral ischemia (on my review I think the study was actually normal) and with normal left ventricular function.  She smokes roughly half a pack of cigarettes a day and has smoked for over 40 years. She has try to quit 5 or 6 times unsuccessfully. She has never managed to stay quit for a week at a time. She repeatedly refers to it as a "very bad habit" but stops short of action committing to quit smoking. She is the only smoker in her household, which she shares with her husband and a grandson.    Past Medical History  Diagnosis Date  . Hypertension   . Diabetes mellitus     non insulin  . Arthritis   . History of blood clots   . Homocysteinemia 11/29/2010  . History of gout     right ring finger  . Left knee pain   . Kidney atrophy     rt.    Past Surgical History  Procedure Laterality Date  . Abdominal hysterectomy    . Knee surgery      rt. arthroscopic  . Tonsillectomy    . Hypertension    . Type ll diabetes    . Neck surgery    . Esophagogastroduodenoscopy N/A 07/02/2012    Procedure: ESOPHAGOGASTRODUODENOSCOPY (EGD);  Surgeon: Rogene Houston, MD;  Location: [facility];  Service: Endoscopy;  Laterality: N/A;     Current Outpatient Prescriptions  Medication Sig Dispense Refill  .  acetaminophen (TYLENOL) 500 MG tablet Take 500 mg by mouth every 6 (six) hours as needed for pain. Takes 2 as needed for discomfort    . Ascorbic Acid (VITAMIN C) 1000 MG tablet Take 1,000 mg by mouth daily.    Marland Kitchen aspirin 81 MG tablet Take 81 mg by mouth every evening.     Marland Kitchen b complex vitamins tablet Take 1 tablet by mouth daily. Takes 2 in the morning.    . Cholecalciferol (VITAMIN D3) 1000 UNITS CAPS Take 1 tablet by mouth daily.      . fish oil-omega-3 fatty acids 1000 MG capsule Take 1 g by mouth daily.     . Flaxseed, Linseed, 1000 MG CAPS Take 1 capsule by mouth daily.    . folic acid (FOLVITE) 1 MG tablet TAKE ONE (1) TABLET BY MOUTH EVERY DAY 60  tablet 3  . glimepiride (AMARYL) 2 MG tablet Take 2 mg by mouth 2 (two) times daily.    Marland Kitchen lidocaine (LIDODERM) 5 % Place 1 patch onto the skin daily. Remove & Discard patch within 12 hours or as directed by MD    . linagliptin (TRADJENTA) 5 MG TABS tablet Take 5 mg by mouth daily.      . metFORMIN (GLUCOPHAGE) 500 MG tablet Take 500 mg by mouth 2 (two) times daily with a meal.    . NIFEdipine (PROCARDIA XL/ADALAT-CC) 60 MG 24 hr tablet Take 60 mg by mouth daily.      Marland Kitchen oxyCODONE-acetaminophen (PERCOCET/ROXICET) 5-325 MG per tablet as needed.    . pantoprazole (PROTONIX) 40 MG tablet Take 40 mg by mouth daily.    . pioglitazone (ACTOS) 30 MG tablet Take 30 mg by mouth daily.      . simvastatin (ZOCOR) 20 MG tablet Take 20 mg by mouth At bedtime.    Marland Kitchen zolpidem (AMBIEN) 10 MG tablet Take 10 mg by mouth at bedtime as needed for sleep.    Marland Kitchen atenolol (TENORMIN) 25 MG tablet Take 1 tablet (25 mg total) by mouth daily. 30 tablet 6   No current facility-administered medications for this visit.    Allergies:   Ace inhibitors; Celecoxib; and Hydrocodone-acetaminophen she developed angioedema with 2ramipril   Social History:  The patient  reports that she has been smoking Cigarettes.  She has a 25 pack-year smoking history. She has never used smokeless tobacco. She reports that she drinks alcohol. She reports that she does not use illicit drugs.   Family History:  The patient's family history includes Diabetes in her maternal aunt, maternal grandmother, maternal uncle, and son; Hypertension in her mother; Prostate cancer in her father; Stroke in her mother.    ROS:  Please see the history of present illness.    Otherwise, review of systems positive for none.   All other systems are reviewed and negative.    PHYSICAL EXAM: VS:  BP 130/66 mmHg  Pulse 94  Resp 16  Ht 5\' 10"  (1.778 m)  Wt 221 lb 1.6 oz (100.29 kg)  BMI 31.72 kg/m2 , BMI Body mass index is 31.72 kg/(m^2).  General: Alert,  oriented x3, no distress Head: no evidence of trauma, PERRL, EOMI, no exophtalmos or lid lag, no myxedema, no xanthelasma; normal ears, nose and oropharynx Neck: normal jugular venous pulsations and no hepatojugular reflux; brisk carotid pulses without delay and no carotid bruits Chest: clear to auscultation, no signs of consolidation by percussion or palpation, normal fremitus, symmetrical and full respiratory excursions Cardiovascular: normal position and quality of  the apical impulse, regular rhythm, normal first and second heart sounds, no murmurs, rubs or gallops Abdomen: no tenderness or distention, no masses by palpation, no abnormal pulsatility or arterial bruits, normal bowel sounds, no hepatosplenomegaly Extremities: no clubbing, cyanosis or edema; 2+ radial, ulnar and brachial pulses bilaterally; 2+ right femoral, posterior tibial and dorsalis pedis pulses; 2+ left femoral, posterior tibial and dorsalis pedis pulses; no subclavian or femoral bruits Neurological: grossly nonfocal Psych: euthymic mood, full affect   EKG:  EKG is ordered today. The ekg ordered today demonstrates NSR   Recent Labs: TSH 1.1 on 01/06/2013, creatinine 1.1 02/08/2014, BUN 24    Lipid Panel November 2015 total cholesterol 152, HDL 55, LDL 81, triglycerides 78    Wt Readings from Last 3 Encounters:  08/13/14 221 lb 1.6 oz (100.29 kg)  05/12/14 220 lb 8 oz (100.018 kg)  11/05/12 215 lb 4.8 oz (97.659 kg)      Other studies Reviewed: Additional studies/ records that were reviewed today include: Office visit, Dr. Hilma Favors   ASSESSMENT AND PLAN:  1. Exertional chest pain - this sounds quite compatible with exertional angina pectoris. I recommended that she have a functional study. She cannot walk on a treadmill due to orthopedic problems. Recommend Lexiscan Myoview. If coronary abnormalities are identified, target LDL should be less than 70 mg/dL. If there is a large reversible defect she undergo  coronary angiography. Add low-dose beta blocker today  2. Right renal artery stenosis - this has been repeatedly recorded in a rather confusing fashion on the past ultrasonography scans. The study is generally report a "greater than 60% stenosis" but with "normal resistive indices". It is hard to say whether the renal atrophy is a primary phenomenon or secondary to renal artery stenosis. Her blood pressure is well controlled on a relatively low dose of a single agent, making renal artery stenosis unlikely.  3. Hyperlipidemia - if vascular disease identified, the target LDL should be less than 70. Otherwise her lipid profile is actually pretty good and at target  4. Diabetes mellitus type 2, well controlled  5. Hypertension, probably essential, well controlled  6. Mild obesity  7. Tobacco abuse - we spent a long time discussing the adverse effects of smoking especially in somebody warty has diabetes and possibly has vascular disease. She is strongly encouraged to quit. She repeatedly acknowledges the fact that it is "a bad habit" and she wishes she could quit. She seems however fatalistic about her odds of success. I tried to encourage her to consider setting a quit date and "pulling out all the stops" in order to achieve smoking cessation. She reports previous skin rash with nicotine patches but seemed to tolerate the gum well.   8. cervical radiculopathy. I think her neck and arm symptoms are not related to her chest pain and are most likely representative of cervical spine disease for which has previously undergone surgery.    Current medicines are reviewed at length with the patient today.  The patient does not have concerns regarding medicines.  The following changes have been made:  Atenolol 25 mg daily  Labs/ tests ordered today include:  Orders Placed This Encounter  Procedures  . Myocardial Perfusion Imaging  . EKG 12-Lead    Patient Instructions  START Atenolol 25mg   daily.  Your physician has requested that you have a lexiscan myoview. For further information please visit [email or web address]. Please follow instruction sheet, as given.  Dr. Sallyanne Kuster recommends that you schedule a follow-up appointment in:  Next available.      Mikael Spray, MD  08/13/2014 1:50 PM    Sanda Klein, MD, [facility] [phone] office [phone] pager

## 2014-08-24 ENCOUNTER — Telehealth (HOSPITAL_COMMUNITY): Payer: Self-pay

## 2014-08-24 NOTE — Telephone Encounter (Signed)
Left message on voicemail in reference to upcoming appointment scheduled on 08-25-2014 at 9:00am with detailed instructions given per Myocardial Perfusion Study Information Sheet for the test. Phone number given for call back for any questions. Oletta Lamas, Delois Tolbert A

## 2014-08-25 ENCOUNTER — Ambulatory Visit (HOSPITAL_COMMUNITY): Payer: Medicare Other | Attending: Cardiovascular Disease

## 2014-08-25 DIAGNOSIS — R079 Chest pain, unspecified: Secondary | ICD-10-CM | POA: Insufficient documentation

## 2014-08-25 DIAGNOSIS — R0602 Shortness of breath: Secondary | ICD-10-CM | POA: Diagnosis not present

## 2014-08-25 LAB — MYOCARDIAL PERFUSION IMAGING
CHL CUP NUCLEAR SSS: 0
CHL CUP RESTING HR STRESS: 64 {beats}/min
CHL CUP STRESS STAGE 1 GRADE: 0 %
CHL CUP STRESS STAGE 1 SPEED: 0 mph
CHL CUP STRESS STAGE 2 HR: 64 {beats}/min
CHL CUP STRESS STAGE 3 DBP: 69 mmHg
CHL CUP STRESS STAGE 3 SPEED: 0 mph
CHL CUP STRESS STAGE 4 HR: 81 {beats}/min
CHL CUP STRESS STAGE 4 SPEED: 0 mph
CHL CUP STRESS STAGE 5 SBP: 122 mmHg
CSEPEW: 1 METS
CSEPPHR: 81 {beats}/min
LHR: 0.29
LV dias vol: 123 mL
LV sys vol: 56 mL
NUC STRESS EF: 55 %
NUC STRESS TID: 1.01
Percent of predicted max HR: 54 %
SDS: 0
SRS: 0
Stage 1 DBP: 73 mmHg
Stage 1 HR: 64 {beats}/min
Stage 1 SBP: 134 mmHg
Stage 2 Grade: 0 %
Stage 2 Speed: 0 mph
Stage 3 Grade: 0 %
Stage 3 HR: 83 {beats}/min
Stage 3 SBP: 134 mmHg
Stage 4 Grade: 0 %
Stage 5 DBP: 67 mmHg
Stage 5 Grade: 0 %
Stage 5 HR: 77 {beats}/min
Stage 5 Speed: 0 mph
Stage 6 DBP: 66 mmHg
Stage 6 Grade: 0 %
Stage 6 HR: 72 {beats}/min
Stage 6 SBP: 128 mmHg
Stage 6 Speed: 0 mph

## 2014-08-25 MED ORDER — REGADENOSON 0.4 MG/5ML IV SOLN
0.4000 mg | Freq: Once | INTRAVENOUS | Status: AC
  Administered 2014-08-25: 0.4 mg via INTRAVENOUS

## 2014-08-25 MED ORDER — TECHNETIUM TC 99M SESTAMIBI GENERIC - CARDIOLITE
11.0000 | Freq: Once | INTRAVENOUS | Status: AC | PRN
  Administered 2014-08-25: 11 via INTRAVENOUS

## 2014-08-25 MED ORDER — TECHNETIUM TC 99M SESTAMIBI GENERIC - CARDIOLITE
33.0000 | Freq: Once | INTRAVENOUS | Status: AC | PRN
  Administered 2014-08-25: 33 via INTRAVENOUS

## 2014-09-08 ENCOUNTER — Emergency Department (HOSPITAL_COMMUNITY)
Admission: EM | Admit: 2014-09-08 | Discharge: 2014-09-09 | Disposition: A | Discharge status: Home / Self Care | Payer: Medicare Other | Attending: Emergency Medicine | Admitting: Emergency Medicine

## 2014-09-08 ENCOUNTER — Encounter (HOSPITAL_COMMUNITY): Payer: Self-pay | Admitting: *Deleted

## 2014-09-08 DIAGNOSIS — Z862 Personal history of diseases of the blood and blood-forming organs and certain disorders involving the immune mechanism: Secondary | ICD-10-CM | POA: Diagnosis not present

## 2014-09-08 DIAGNOSIS — N289 Disorder of kidney and ureter, unspecified: Secondary | ICD-10-CM | POA: Diagnosis not present

## 2014-09-08 DIAGNOSIS — R079 Chest pain, unspecified: Secondary | ICD-10-CM | POA: Insufficient documentation

## 2014-09-08 DIAGNOSIS — E119 Type 2 diabetes mellitus without complications: Secondary | ICD-10-CM | POA: Insufficient documentation

## 2014-09-08 DIAGNOSIS — Z720 Tobacco use: Secondary | ICD-10-CM | POA: Insufficient documentation

## 2014-09-08 DIAGNOSIS — R112 Nausea with vomiting, unspecified: Secondary | ICD-10-CM

## 2014-09-08 DIAGNOSIS — N2889 Other specified disorders of kidney and ureter: Secondary | ICD-10-CM | POA: Diagnosis not present

## 2014-09-08 DIAGNOSIS — Z7982 Long term (current) use of aspirin: Secondary | ICD-10-CM | POA: Insufficient documentation

## 2014-09-08 DIAGNOSIS — Z87448 Personal history of other diseases of urinary system: Secondary | ICD-10-CM | POA: Insufficient documentation

## 2014-09-08 DIAGNOSIS — R109 Unspecified abdominal pain: Secondary | ICD-10-CM | POA: Diagnosis present

## 2014-09-08 DIAGNOSIS — M1990 Unspecified osteoarthritis, unspecified site: Secondary | ICD-10-CM | POA: Insufficient documentation

## 2014-09-08 DIAGNOSIS — I10 Essential (primary) hypertension: Secondary | ICD-10-CM | POA: Insufficient documentation

## 2014-09-08 DIAGNOSIS — R1013 Epigastric pain: Secondary | ICD-10-CM | POA: Diagnosis not present

## 2014-09-08 DIAGNOSIS — Z79899 Other long term (current) drug therapy: Secondary | ICD-10-CM | POA: Diagnosis not present

## 2014-09-08 DIAGNOSIS — R1010 Upper abdominal pain, unspecified: Secondary | ICD-10-CM | POA: Diagnosis not present

## 2014-09-08 DIAGNOSIS — K5730 Diverticulosis of large intestine without perforation or abscess without bleeding: Secondary | ICD-10-CM | POA: Diagnosis not present

## 2014-09-08 LAB — CBC WITH DIFFERENTIAL/PLATELET
BASOS PCT: 0 % (ref 0–1)
Basophils Absolute: 0 10*3/uL (ref 0.0–0.1)
EOS PCT: 0 % (ref 0–5)
Eosinophils Absolute: 0 10*3/uL (ref 0.0–0.7)
HCT: 39.3 % (ref 36.0–46.0)
Hemoglobin: 13.2 g/dL (ref 12.0–15.0)
Lymphocytes Relative: 8 % — ABNORMAL LOW (ref 12–46)
Lymphs Abs: 0.9 10*3/uL (ref 0.7–4.0)
MCH: 30.3 pg (ref 26.0–34.0)
MCHC: 33.6 g/dL (ref 30.0–36.0)
MCV: 90.1 fL (ref 78.0–100.0)
MONO ABS: 0.5 10*3/uL (ref 0.1–1.0)
MONOS PCT: 4 % (ref 3–12)
NEUTROS PCT: 88 % — AB (ref 43–77)
Neutro Abs: 9.6 10*3/uL — ABNORMAL HIGH (ref 1.7–7.7)
PLATELETS: 216 10*3/uL (ref 150–400)
RBC: 4.36 MIL/uL (ref 3.87–5.11)
RDW: 14.9 % (ref 11.5–15.5)
WBC: 11 10*3/uL — ABNORMAL HIGH (ref 4.0–10.5)

## 2014-09-08 LAB — COMPREHENSIVE METABOLIC PANEL
ALBUMIN: 4.1 g/dL (ref 3.5–5.0)
ALT: 15 U/L (ref 14–54)
ANION GAP: 14 (ref 5–15)
AST: 18 U/L (ref 15–41)
Alkaline Phosphatase: 70 U/L (ref 38–126)
BUN: 17 mg/dL (ref 6–20)
CHLORIDE: 97 mmol/L — AB (ref 101–111)
CO2: 27 mmol/L (ref 22–32)
CREATININE: 1.23 mg/dL — AB (ref 0.44–1.00)
Calcium: 9.9 mg/dL (ref 8.9–10.3)
GFR calc non Af Amer: 43 mL/min — ABNORMAL LOW (ref >60)
GFR, EST AFRICAN AMERICAN: 50 mL/min — AB (ref >60)
GLUCOSE: 239 mg/dL — AB (ref 65–99)
Potassium: 3.9 mmol/L (ref 3.5–5.1)
SODIUM: 138 mmol/L (ref 135–145)
TOTAL PROTEIN: 8 g/dL (ref 6.5–8.1)
Total Bilirubin: 0.6 mg/dL (ref 0.3–1.2)

## 2014-09-08 LAB — LIPASE, BLOOD: LIPASE: 21 U/L — AB (ref 22–51)

## 2014-09-08 LAB — CBG MONITORING, ED: GLUCOSE-CAPILLARY: 220 mg/dL — AB (ref 65–99)

## 2014-09-08 LAB — TROPONIN I

## 2014-09-08 MED ORDER — ONDANSETRON HCL 4 MG/2ML IJ SOLN
4.0000 mg | Freq: Once | INTRAMUSCULAR | Status: AC
  Administered 2014-09-08: 4 mg via INTRAVENOUS
  Filled 2014-09-08: qty 2

## 2014-09-08 MED ORDER — SODIUM CHLORIDE 0.9 % IV BOLUS (SEPSIS)
1000.0000 mL | Freq: Once | INTRAVENOUS | Status: AC
  Administered 2014-09-08: 1000 mL via INTRAVENOUS

## 2014-09-08 NOTE — ED Provider Notes (Signed)
CSN: [id number]     Arrival date & time 09/08/14  2220 History  This chart was scribed for Ezequiel Essex, MD by Randa Evens, ED Scribe. This patient was seen in room [facility] and the patient's care was started at 11:01 PM.     Chief Complaint  Patient presents with  . Abdominal Pain   Patient is a 69 y.o. female presenting with abdominal pain. The history is provided by the patient. No language interpreter was used.  Abdominal Pain Associated symptoms: chest pain, chills, nausea and vomiting   Associated symptoms: no diarrhea, no dysuria, no fever and no hematuria    HPI Comments: [name] is a 69 y.o. female with PMHx of DM and HTN who presents to the Emergency Department complaining of new constant abdominal pain onset this morning. Pt states she has associated nausea and vomitnig. Pt report 3 episodes of loose stool today. Pt denies recent sick contacts. Pt states that she may have eating suspect food last night at church. Her husband states that he did not eat the same food. Pt denies any alleviating symptoms. Pt states that certain smells makes the nausea worse. Pt states she has tried [employer] with no relief. Pt denies fever, diarrhea, dysuria, hematuria.  Pt denies any abdominal surgeries. Pt denies alcohol use.  Notably patient had negative stress test on May 25. She denies any chest pain or shortness of breath today.    Past Medical History  Diagnosis Date  . Hypertension   . Diabetes mellitus     non insulin  . Arthritis   . History of blood clots   . Homocysteinemia 11/29/2010  . History of gout     right ring finger  . Left knee pain   . Kidney atrophy     rt.   Past Surgical History  Procedure Laterality Date  . Abdominal hysterectomy    . Knee surgery      rt. arthroscopic  . Tonsillectomy    . Hypertension    . Type ll diabetes    . Neck surgery    . Esophagogastroduodenoscopy N/A 07/02/2012    Procedure:  ESOPHAGOGASTRODUODENOSCOPY (EGD);  Surgeon: Rogene Houston, MD;  Location: [facility];  Service: Endoscopy;  Laterality: N/A;   Family History  Problem Relation Age of Onset  . Stroke Mother   . Hypertension Mother   . Prostate cancer Father   . Diabetes Maternal Aunt   . Diabetes Maternal Uncle   . Diabetes Maternal Grandmother   . Diabetes Son    History  Substance Use Topics  . Smoking status: Current Every Day Smoker -- 0.50 packs/day for 50 years    Types: Cigarettes  . Smokeless tobacco: Never Used  . Alcohol Use: Yes     Comment: occasionally   OB History    No data available      Review of Systems  Constitutional: Positive for chills. Negative for fever.  Cardiovascular: Positive for chest pain.  Gastrointestinal: Positive for nausea, vomiting and abdominal pain. Negative for diarrhea.  Genitourinary: Negative for dysuria and hematuria.  All other systems reviewed and are negative.    Allergies  Ace inhibitors; Celecoxib; and Hydrocodone-acetaminophen  Home Medications   Prior to Admission medications   Medication Sig Start Date End Date Taking? Authorizing Provider  acetaminophen (TYLENOL) 500 MG tablet Take 500 mg by mouth every 6 (six) hours as needed for pain. Takes 2 as needed for discomfort  Historical Provider, MD  Ascorbic Acid (VITAMIN C) 1000 MG tablet Take 1,000 mg by mouth daily.    Historical Provider, MD  aspirin 81 MG tablet Take 81 mg by mouth every evening.     Historical Provider, MD  atenolol (TENORMIN) 25 MG tablet Take 1 tablet (25 mg total) by mouth daily. 08/13/14   Mihai Croitoru, MD  b complex vitamins tablet Take 1 tablet by mouth daily. Takes 2 in the morning.    Historical Provider, MD  Cholecalciferol (VITAMIN D3) 1000 UNITS CAPS Take 1 tablet by mouth daily.      Historical Provider, MD  fish oil-omega-3 fatty acids 1000 MG capsule Take 1 g by mouth daily.     Historical Provider, MD  Flaxseed, Linseed, 1000 MG CAPS Take 1  capsule by mouth daily.    Historical Provider, MD  folic acid (FOLVITE) 1 MG tablet TAKE ONE (1) TABLET BY MOUTH EVERY DAY 09/07/13   Baird Cancer, PA-C  glimepiride (AMARYL) 2 MG tablet Take 2 mg by mouth 2 (two) times daily.    Historical Provider, MD  lidocaine (LIDODERM) 5 % Place 1 patch onto the skin daily. Remove & Discard patch within 12 hours or as directed by MD    Historical Provider, MD  linagliptin (TRADJENTA) 5 MG TABS tablet Take 5 mg by mouth daily.      Historical Provider, MD  metFORMIN (GLUCOPHAGE) 500 MG tablet Take 500 mg by mouth 2 (two) times daily with a meal.    Historical Provider, MD  NIFEdipine (PROCARDIA XL/ADALAT-CC) 60 MG 24 hr tablet Take 60 mg by mouth daily.      Historical Provider, MD  ondansetron (ZOFRAN) 4 MG tablet Take 1 tablet (4 mg total) by mouth every 6 (six) hours. 09/09/14   Ezequiel Essex, MD  oxyCODONE-acetaminophen (PERCOCET/ROXICET) 5-325 MG per tablet as needed. 06/18/14   Historical Provider, MD  pantoprazole (PROTONIX) 40 MG tablet Take 40 mg by mouth daily.    Historical Provider, MD  pioglitazone (ACTOS) 30 MG tablet Take 30 mg by mouth daily.      Historical Provider, MD  simvastatin (ZOCOR) 20 MG tablet Take 20 mg by mouth At bedtime.    Historical Provider, MD  zolpidem (AMBIEN) 10 MG tablet Take 10 mg by mouth at bedtime as needed for sleep.    Historical Provider, MD   BP 184/81 mmHg  Pulse 77  Temp(Src) 98.6 F (37 C) (Oral)  Resp 18  Ht 5\' 11"  (1.803 m)  Wt 215 lb (97.523 kg)  BMI 30.00 kg/m2  SpO2 100%   Physical Exam  Constitutional: She is oriented to person, place, and time. She appears well-developed and well-nourished. No distress.  HENT:  Head: Normocephalic and atraumatic.  Mouth/Throat: Oropharynx is clear and moist. No oropharyngeal exudate.  Eyes: Conjunctivae and EOM are normal. Pupils are equal, round, and reactive to light.  Neck: Normal range of motion. Neck supple.  No meningismus.  Cardiovascular: Normal  rate, regular rhythm, normal heart sounds and intact distal pulses.   No murmur heard. Pulmonary/Chest: Effort normal and breath sounds normal. No respiratory distress. She exhibits tenderness.  Sternal tenderness.   Abdominal: Soft. There is tenderness in the epigastric area. There is no rebound, no guarding and no CVA tenderness.  No RLQ tenderness.   Musculoskeletal: Normal range of motion. She exhibits no edema or tenderness.  Neurological: She is alert and oriented to person, place, and time. No cranial nerve deficit. She exhibits normal muscle  tone. Coordination normal.  No ataxia on finger to nose bilaterally. No pronator drift. 5/5 strength throughout. CN 2-12 intact. Negative Romberg. Equal grip strength. Sensation intact. Gait is normal.   Skin: Skin is warm.  Psychiatric: She has a normal mood and affect. Her behavior is normal.  Nursing note and vitals reviewed.   ED Course  Procedures (including critical care time) DIAGNOSTIC STUDIES: Oxygen Saturation is 100% on RA, normal by my interpretation.    COORDINATION OF CARE: 11:04 PM-Discussed treatment plan with pt at bedside and pt agreed to plan.     Labs Review Labs Reviewed  CBC WITH DIFFERENTIAL/PLATELET - Abnormal; Notable for the following:    WBC 11.0 (*)    Neutrophils Relative % 88 (*)    Neutro Abs 9.6 (*)    Lymphocytes Relative 8 (*)    All other components within normal limits  URINALYSIS, ROUTINE W REFLEX MICROSCOPIC (NOT AT [facility]) - Abnormal; Notable for the following:    Glucose, UA 250 (*)    Protein, ur 30 (*)    All other components within normal limits  LIPASE, BLOOD - Abnormal; Notable for the following:    Lipase 21 (*)    All other components within normal limits  COMPREHENSIVE METABOLIC PANEL - Abnormal; Notable for the following:    Chloride 97 (*)    Glucose, Bld 239 (*)    Creatinine, Ser 1.23 (*)    GFR calc non Af Amer 43 (*)    GFR calc Af Amer 50 (*)    All other components within  normal limits  URINE MICROSCOPIC-ADD ON - Abnormal; Notable for the following:    Squamous Epithelial / LPF FEW (*)    All other components within normal limits  CBG MONITORING, ED - Abnormal; Notable for the following:    Glucose-Capillary 220 (*)    All other components within normal limits  TROPONIN I  CBG MONITORING, ED  I-STAT CG4 LACTIC ACID, ED  I-STAT CG4 LACTIC ACID, ED  CBG MONITORING, ED    Imaging Review Ct Abdomen Pelvis Wo Contrast  09/09/2014   CLINICAL DATA:  New abdominal pain, onset this morning. Nausea and vomiting.  EXAM: CT ABDOMEN AND PELVIS WITHOUT CONTRAST  TECHNIQUE: Multidetector CT imaging of the abdomen and pelvis was performed following the standard protocol without IV contrast.  COMPARISON:  06/29/2012, 11/17/2010  FINDINGS: There is continued enlargement of the right renal mass, now measuring 3.2 cm and previously measuring 2.5 cm on 06/29/2012 and 1.9 cm on 11/17/2010. This is suspicious for a renal cell carcinoma. The right kidney is markedly atrophic. There is chronic stranding around the right kidney.  The left kidney is unremarkable. There is no left hydronephrosis or hydroureter. There is an unchanged 2 cm left adrenal nodule. The right adrenal is normal.  There are unremarkable unenhanced appearances of the liver. There is cholelithiasis. The bile ducts appear unremarkable.  The pancreas and spleen appear unremarkable.  Bowel is remarkable only for mild uncomplicated colonic diverticulosis.  The abdominal aorta is normal in caliber. There is mild atherosclerotic calcification. There is no adenopathy in the abdomen or pelvis.  There is prior hysterectomy.  No adnexal abnormalities are evident.  No acute inflammatory changes are evident in the abdomen or pelvis. There is no ascites. There is no adenopathy.  No significant skeletal lesions are evident. There is facet arthritis at L4-5 and L5-S1  There is no significant abnormality in the lower chest.  IMPRESSION: 1.  Slowly enlarging  right renal mass, suspicious for renal cell carcinoma. 2. No acute findings are evident in the abdomen or pelvis. 3. Benign left adrenal nodule. 4. Diverticulosis. 5. Severe lower lumbar facet arthritis.   Electronically Signed   By: Andreas Newport M.D.   On: 09/09/2014 04:29     EKG Interpretation   Date/Time:  Wednesday September 08 2014 22:35:25 EDT Ventricular Rate:  74 PR Interval:  164 QRS Duration: 84 QT Interval:  408 QTC Calculation: 452 R Axis:   39 Text Interpretation:  Normal sinus rhythm Possible Left atrial enlargement  Cannot rule out Anterior infarct , age undetermined Abnormal ECG No  significant change since last tracing Confirmed by WARD,  DO, KRISTEN  [phone]) on 09/08/2014 10:40:31 PM      MDM   Final diagnoses:  Nausea and vomiting, vomiting of unspecified type  Abdominal pain, unspecified abdominal location  Renal mass   Abdominal pain with nausea and vomiting and loose stools since this morning. Reports 3 episodes of vomiting 3 loose stools after eating at church banquet last night. No fever but has had chills. No one else has been sick.  EKG normal sinus rhythm. WBC 11. LFT normal. Lipase normal. UA negative.   Patient with similar admission in 2014 for upper abdominal pain with nausea and vomiting. Had normal EGD at that time. Known history of gallstones  CT shows concern for renal cell carcinoma and enlarging mass. D/w Patient and husband.  She has seen Dr. Gaynelle Arabian in the past. She understands the need for follow up regarding this.   Doubt gallbladder as cause of symptoms today.  She is tolerating PO in the ED.  Start zofran. She is already on protonix.  Will arrange outpatient RUQ [place].  Return precautions discussed.   I personally performed the services described in this documentation, which was scribed in my presence. The recorded information has been reviewed and is accurate.   Ezequiel Essex, MD 09/09/14 [phone]

## 2014-09-08 NOTE — ED Notes (Addendum)
Pt c/o upper abdominal pain and n/v since this morning. Pt also reports episodes of getting clammy.

## 2014-09-09 ENCOUNTER — Emergency Department (HOSPITAL_COMMUNITY): Payer: Medicare Other

## 2014-09-09 DIAGNOSIS — K5730 Diverticulosis of large intestine without perforation or abscess without bleeding: Secondary | ICD-10-CM | POA: Diagnosis not present

## 2014-09-09 DIAGNOSIS — N289 Disorder of kidney and ureter, unspecified: Secondary | ICD-10-CM | POA: Diagnosis not present

## 2014-09-09 LAB — CBG MONITORING, ED: Glucose-Capillary: 186 mg/dL — ABNORMAL HIGH (ref 65–99)

## 2014-09-09 LAB — URINALYSIS, ROUTINE W REFLEX MICROSCOPIC
Bilirubin Urine: NEGATIVE
Glucose, UA: 250 mg/dL — AB
HGB URINE DIPSTICK: NEGATIVE
KETONES UR: NEGATIVE mg/dL
Leukocytes, UA: NEGATIVE
NITRITE: NEGATIVE
PH: 7.5 (ref 5.0–8.0)
Protein, ur: 30 mg/dL — AB
Specific Gravity, Urine: 1.025 (ref 1.005–1.030)
UROBILINOGEN UA: 0.2 mg/dL (ref 0.0–1.0)

## 2014-09-09 LAB — I-STAT CG4 LACTIC ACID, ED: Lactic Acid, Venous: 0.8 mmol/L (ref 0.5–2.0)

## 2014-09-09 LAB — URINE MICROSCOPIC-ADD ON

## 2014-09-09 MED ORDER — IOHEXOL 300 MG/ML  SOLN
50.0000 mL | Freq: Once | INTRAMUSCULAR | Status: AC | PRN
  Administered 2014-09-09: 50 mL via ORAL

## 2014-09-09 MED ORDER — ONDANSETRON HCL 4 MG PO TABS: 4.0000 mg | ORAL_TABLET | Freq: Four times a day (QID) | ORAL | Status: DC

## 2014-09-09 MED ORDER — ONDANSETRON 8 MG PO TBDP
8.0000 mg | ORAL_TABLET | Freq: Once | ORAL | Status: AC
  Administered 2014-09-09: 8 mg via ORAL
  Filled 2014-09-09: qty 1

## 2014-09-09 NOTE — ED Notes (Signed)
Patient verbalizes understanding of discharge instructions, prescription medications, home care and follow up care. Patient out of department at this time with family. 

## 2014-09-09 NOTE — ED Notes (Signed)
Patient states that she feels very nauseated and pain is still there. Reminded patient need for urine specimen, patient voided and still did not give sample.

## 2014-09-09 NOTE — ED Notes (Signed)
Patient ambulatory to restroom to collect urine sample 

## 2014-09-09 NOTE — Discharge Instructions (Signed)
Nausea and Vomiting Take the nausea medication as prescribed. Return tomorrow for an ultrasound of your gallbladder. Follow up with Dr. Gaynelle Arabian regarding your mass on the kidney. Return to the ED if you develop new or worsening symptoms. Nausea is a sick feeling that often comes before throwing up (vomiting). Vomiting is a reflex where stomach contents come out of your mouth. Vomiting can cause severe loss of body fluids (dehydration). Children and elderly adults can become dehydrated quickly, especially if they also have diarrhea. Nausea and vomiting are symptoms of a condition or disease. It is important to find the cause of your symptoms. CAUSES   Direct irritation of the stomach lining. This irritation can result from increased acid production (gastroesophageal reflux disease), infection, food poisoning, taking certain medicines (such as nonsteroidal anti-inflammatory drugs), alcohol use, or tobacco use.  Signals from the brain.These signals could be caused by a headache, heat exposure, an inner ear disturbance, increased pressure in the brain from injury, infection, a tumor, or a concussion, pain, emotional stimulus, or metabolic problems.  An obstruction in the gastrointestinal tract (bowel obstruction).  Illnesses such as diabetes, hepatitis, gallbladder problems, appendicitis, kidney problems, cancer, sepsis, atypical symptoms of a heart attack, or eating disorders.  Medical treatments such as chemotherapy and radiation.  Receiving medicine that makes you sleep (general anesthetic) during surgery. DIAGNOSIS Your caregiver may ask for tests to be done if the problems do not improve after a few days. Tests may also be done if symptoms are severe or if the reason for the nausea and vomiting is not clear. Tests may include:  Urine tests.  Blood tests.  Stool tests.  Cultures (to look for evidence of infection).  X-rays or other imaging studies. Test results can help your caregiver  make decisions about treatment or the need for additional tests. TREATMENT You need to stay well hydrated. Drink frequently but in small amounts.You may wish to drink water, sports drinks, clear broth, or eat frozen ice pops or gelatin dessert to help stay hydrated.When you eat, eating slowly may help prevent nausea.There are also some antinausea medicines that may help prevent nausea. HOME CARE INSTRUCTIONS   Take all medicine as directed by your caregiver.  If you do not have an appetite, do not force yourself to eat. However, you must continue to drink fluids.  If you have an appetite, eat a normal diet unless your caregiver tells you differently.  Eat a variety of complex carbohydrates (rice, wheat, potatoes, bread), lean meats, yogurt, fruits, and vegetables.  Avoid high-fat foods because they are more difficult to digest.  Drink enough water and fluids to keep your urine clear or pale yellow.  If you are dehydrated, ask your caregiver for specific rehydration instructions. Signs of dehydration may include:  Severe thirst.  Dry lips and mouth.  Dizziness.  Dark urine.  Decreasing urine frequency and amount.  Confusion.  Rapid breathing or pulse. SEEK IMMEDIATE MEDICAL CARE IF:   You have blood or brown flecks (like coffee grounds) in your vomit.  You have black or bloody stools.  You have a severe headache or stiff neck.  You are confused.  You have severe abdominal pain.  You have chest pain or trouble breathing.  You do not urinate at least once every 8 hours.  You develop cold or clammy skin.  You continue to vomit for longer than 24 to 48 hours.  You have a fever. MAKE SURE YOU:   Understand these instructions.  Will watch your  condition.  Will get help right away if you are not doing well or get worse. Document Released: 03/19/2005 Document Revised: 06/11/2011 Document Reviewed: 08/16/2010 [facility] Patient Information 2015 [place], [place].  This information is not intended to replace advice given to you by your health care provider. Make sure you discuss any questions you have with your health care provider.

## 2014-09-09 NOTE — ED Notes (Signed)
Patient provided ginger ale for PO fluid challenge. 

## 2014-09-11 ENCOUNTER — Encounter (HOSPITAL_COMMUNITY): Payer: Self-pay | Admitting: Emergency Medicine

## 2014-09-11 ENCOUNTER — Emergency Department (HOSPITAL_COMMUNITY): Payer: Medicare Other

## 2014-09-11 ENCOUNTER — Inpatient Hospital Stay (HOSPITAL_COMMUNITY)
Admission: EM | Admit: 2014-09-11 | Discharge: 2014-09-14 | DRG: 392 | Disposition: A | Discharge status: Home / Self Care | Payer: Medicare Other | Attending: Internal Medicine | Admitting: Internal Medicine

## 2014-09-11 DIAGNOSIS — R109 Unspecified abdominal pain: Secondary | ICD-10-CM | POA: Diagnosis present

## 2014-09-11 DIAGNOSIS — F17210 Nicotine dependence, cigarettes, uncomplicated: Secondary | ICD-10-CM | POA: Diagnosis not present

## 2014-09-11 DIAGNOSIS — K8020 Calculus of gallbladder without cholecystitis without obstruction: Secondary | ICD-10-CM | POA: Diagnosis not present

## 2014-09-11 DIAGNOSIS — R112 Nausea with vomiting, unspecified: Secondary | ICD-10-CM | POA: Diagnosis not present

## 2014-09-11 DIAGNOSIS — M1990 Unspecified osteoarthritis, unspecified site: Secondary | ICD-10-CM | POA: Diagnosis present

## 2014-09-11 DIAGNOSIS — A084 Viral intestinal infection, unspecified: Principal | ICD-10-CM | POA: Diagnosis present

## 2014-09-11 DIAGNOSIS — E871 Hypo-osmolality and hyponatremia: Secondary | ICD-10-CM | POA: Diagnosis present

## 2014-09-11 DIAGNOSIS — G8929 Other chronic pain: Secondary | ICD-10-CM | POA: Diagnosis not present

## 2014-09-11 DIAGNOSIS — E119 Type 2 diabetes mellitus without complications: Secondary | ICD-10-CM

## 2014-09-11 DIAGNOSIS — M549 Dorsalgia, unspecified: Secondary | ICD-10-CM

## 2014-09-11 DIAGNOSIS — Z823 Family history of stroke: Secondary | ICD-10-CM

## 2014-09-11 DIAGNOSIS — K2970 Gastritis, unspecified, without bleeding: Secondary | ICD-10-CM | POA: Insufficient documentation

## 2014-09-11 DIAGNOSIS — E1122 Type 2 diabetes mellitus with diabetic chronic kidney disease: Secondary | ICD-10-CM

## 2014-09-11 DIAGNOSIS — K2990 Gastroduodenitis, unspecified, without bleeding: Secondary | ICD-10-CM

## 2014-09-11 DIAGNOSIS — I10 Essential (primary) hypertension: Secondary | ICD-10-CM | POA: Diagnosis present

## 2014-09-11 DIAGNOSIS — N2889 Other specified disorders of kidney and ureter: Secondary | ICD-10-CM | POA: Diagnosis present

## 2014-09-11 DIAGNOSIS — Z833 Family history of diabetes mellitus: Secondary | ICD-10-CM

## 2014-09-11 DIAGNOSIS — Z7982 Long term (current) use of aspirin: Secondary | ICD-10-CM

## 2014-09-11 DIAGNOSIS — E1351 Other specified diabetes mellitus with diabetic peripheral angiopathy without gangrene: Secondary | ICD-10-CM

## 2014-09-11 DIAGNOSIS — E860 Dehydration: Secondary | ICD-10-CM | POA: Diagnosis not present

## 2014-09-11 DIAGNOSIS — Z8249 Family history of ischemic heart disease and other diseases of the circulatory system: Secondary | ICD-10-CM

## 2014-09-11 LAB — COMPREHENSIVE METABOLIC PANEL
ALT: 14 U/L (ref 14–54)
AST: 17 U/L (ref 15–41)
Albumin: 4.2 g/dL (ref 3.5–5.0)
Alkaline Phosphatase: 76 U/L (ref 38–126)
Anion gap: 16 — ABNORMAL HIGH (ref 5–15)
BILIRUBIN TOTAL: 0.8 mg/dL (ref 0.3–1.2)
BUN: 16 mg/dL (ref 6–20)
CO2: 26 mmol/L (ref 22–32)
CREATININE: 1.47 mg/dL — AB (ref 0.44–1.00)
Calcium: 10.2 mg/dL (ref 8.9–10.3)
Chloride: 87 mmol/L — ABNORMAL LOW (ref 101–111)
GFR, EST AFRICAN AMERICAN: 40 mL/min — AB (ref >60)
GFR, EST NON AFRICAN AMERICAN: 35 mL/min — AB (ref >60)
GLUCOSE: 161 mg/dL — AB (ref 65–99)
POTASSIUM: 3.3 mmol/L — AB (ref 3.5–5.1)
Sodium: 129 mmol/L — ABNORMAL LOW (ref 135–145)
TOTAL PROTEIN: 8.7 g/dL — AB (ref 6.5–8.1)

## 2014-09-11 LAB — TSH: TSH: 1.251 u[IU]/mL (ref 0.350–4.500)

## 2014-09-11 LAB — GLUCOSE, CAPILLARY: Glucose-Capillary: 135 mg/dL — ABNORMAL HIGH (ref 65–99)

## 2014-09-11 LAB — URINALYSIS, ROUTINE W REFLEX MICROSCOPIC
BILIRUBIN URINE: NEGATIVE
GLUCOSE, UA: NEGATIVE mg/dL
KETONES UR: NEGATIVE mg/dL
Leukocytes, UA: NEGATIVE
Nitrite: NEGATIVE
Protein, ur: 30 mg/dL — AB
Specific Gravity, Urine: <1.005 — ABNORMAL LOW (ref 1.005–1.030)
UROBILINOGEN UA: 0.2 mg/dL (ref 0.0–1.0)
pH: 6.5 (ref 5.0–8.0)

## 2014-09-11 LAB — CBC WITH DIFFERENTIAL/PLATELET
Basophils Absolute: 0 10*3/uL (ref 0.0–0.1)
Basophils Relative: 0 % (ref 0–1)
EOS PCT: 0 % (ref 0–5)
Eosinophils Absolute: 0 10*3/uL (ref 0.0–0.7)
HCT: 44.8 % (ref 36.0–46.0)
Hemoglobin: 15.4 g/dL — ABNORMAL HIGH (ref 12.0–15.0)
Lymphocytes Relative: 20 % (ref 12–46)
Lymphs Abs: 1.6 10*3/uL (ref 0.7–4.0)
MCH: 30.7 pg (ref 26.0–34.0)
MCHC: 34.4 g/dL (ref 30.0–36.0)
MCV: 89.2 fL (ref 78.0–100.0)
MONOS PCT: 10 % (ref 3–12)
Monocytes Absolute: 0.8 10*3/uL (ref 0.1–1.0)
Neutro Abs: 5.5 10*3/uL (ref 1.7–7.7)
Neutrophils Relative %: 70 % (ref 43–77)
Platelets: 217 10*3/uL (ref 150–400)
RBC: 5.02 MIL/uL (ref 3.87–5.11)
RDW: 14.4 % (ref 11.5–15.5)
WBC: 7.9 10*3/uL (ref 4.0–10.5)

## 2014-09-11 LAB — LIPASE, BLOOD: LIPASE: 18 U/L — AB (ref 22–51)

## 2014-09-11 LAB — URINE MICROSCOPIC-ADD ON

## 2014-09-11 LAB — POC OCCULT BLOOD, ED: Fecal Occult Bld: NEGATIVE

## 2014-09-11 MED ORDER — ZOLPIDEM TARTRATE 5 MG PO TABS
5.0000 mg | ORAL_TABLET | Freq: Every evening | ORAL | Status: DC | PRN
  Administered 2014-09-11 – 2014-09-14 (×2): 5 mg via ORAL
  Filled 2014-09-11 (×2): qty 1

## 2014-09-11 MED ORDER — INSULIN ASPART 100 UNIT/ML ~~LOC~~ SOLN
0.0000 [IU] | SUBCUTANEOUS | Status: DC
  Administered 2014-09-11 (×2): 2 [IU] via SUBCUTANEOUS
  Administered 2014-09-12: 5 [IU] via SUBCUTANEOUS
  Administered 2014-09-12 (×3): 3 [IU] via SUBCUTANEOUS
  Administered 2014-09-12: 2 [IU] via SUBCUTANEOUS
  Administered 2014-09-13 (×2): 3 [IU] via SUBCUTANEOUS
  Administered 2014-09-13: 5 [IU] via SUBCUTANEOUS
  Administered 2014-09-13 (×2): 3 [IU] via SUBCUTANEOUS
  Administered 2014-09-14: 2 [IU] via SUBCUTANEOUS
  Administered 2014-09-14 (×2): 3 [IU] via SUBCUTANEOUS
  Administered 2014-09-14: 5 [IU] via SUBCUTANEOUS

## 2014-09-11 MED ORDER — SODIUM CHLORIDE 0.9 % IV BOLUS (SEPSIS)
1000.0000 mL | Freq: Once | INTRAVENOUS | Status: AC
  Administered 2014-09-11: 1000 mL via INTRAVENOUS

## 2014-09-11 MED ORDER — MORPHINE SULFATE 4 MG/ML IJ SOLN
4.0000 mg | Freq: Once | INTRAMUSCULAR | Status: AC
  Administered 2014-09-11: 4 mg via INTRAVENOUS
  Filled 2014-09-11: qty 1

## 2014-09-11 MED ORDER — METOCLOPRAMIDE HCL 5 MG/ML IJ SOLN
10.0000 mg | Freq: Four times a day (QID) | INTRAMUSCULAR | Status: DC | PRN
  Administered 2014-09-12 (×3): 10 mg via INTRAVENOUS
  Filled 2014-09-11 (×3): qty 2

## 2014-09-11 MED ORDER — OMEGA-3-ACID ETHYL ESTERS 1 G PO CAPS
1.0000 g | ORAL_CAPSULE | Freq: Every day | ORAL | Status: DC
  Administered 2014-09-11 – 2014-09-14 (×4): 1 g via ORAL
  Filled 2014-09-11 (×7): qty 1

## 2014-09-11 MED ORDER — LIDOCAINE 5 % EX PTCH
1.0000 | MEDICATED_PATCH | CUTANEOUS | Status: DC
  Administered 2014-09-12 – 2014-09-13 (×2): 1 via TRANSDERMAL
  Filled 2014-09-11 (×4): qty 1

## 2014-09-11 MED ORDER — ASPIRIN EC 81 MG PO TBEC
81.0000 mg | DELAYED_RELEASE_TABLET | Freq: Every evening | ORAL | Status: DC
  Administered 2014-09-11: 81 mg via ORAL
  Filled 2014-09-11: qty 1

## 2014-09-11 MED ORDER — SODIUM CHLORIDE 0.9 % IV BOLUS (SEPSIS)
500.0000 mL | Freq: Once | INTRAVENOUS | Status: AC
  Administered 2014-09-11: 500 mL via INTRAVENOUS

## 2014-09-11 MED ORDER — FAMOTIDINE IN NACL 20-0.9 MG/50ML-% IV SOLN
20.0000 mg | Freq: Once | INTRAVENOUS | Status: AC
  Administered 2014-09-11: 20 mg via INTRAVENOUS
  Filled 2014-09-11: qty 50

## 2014-09-11 MED ORDER — PANTOPRAZOLE SODIUM 40 MG PO TBEC
40.0000 mg | DELAYED_RELEASE_TABLET | Freq: Every day | ORAL | Status: DC
  Administered 2014-09-11 – 2014-09-12 (×2): 40 mg via ORAL
  Filled 2014-09-11 (×2): qty 1

## 2014-09-11 MED ORDER — FOLIC ACID 1 MG PO TABS
1.0000 mg | ORAL_TABLET | Freq: Every day | ORAL | Status: DC
  Administered 2014-09-11 – 2014-09-14 (×4): 1 mg via ORAL
  Filled 2014-09-11 (×4): qty 1

## 2014-09-11 MED ORDER — LIDOCAINE 5 % EX PTCH
MEDICATED_PATCH | CUTANEOUS | Status: AC
  Filled 2014-09-11: qty 1

## 2014-09-11 MED ORDER — HEPARIN SODIUM (PORCINE) 5000 UNIT/ML IJ SOLN
5000.0000 [IU] | Freq: Three times a day (TID) | INTRAMUSCULAR | Status: DC
  Administered 2014-09-11 – 2014-09-12 (×4): 5000 [IU] via SUBCUTANEOUS
  Filled 2014-09-11 (×4): qty 1

## 2014-09-11 MED ORDER — HYDROMORPHONE HCL 1 MG/ML IJ SOLN
0.5000 mg | INTRAMUSCULAR | Status: DC | PRN
  Administered 2014-09-12 – 2014-09-13 (×2): 0.5 mg via INTRAVENOUS
  Filled 2014-09-11 (×3): qty 1

## 2014-09-11 MED ORDER — MORPHINE SULFATE 4 MG/ML IJ SOLN: 4.0000 mg | Freq: Once | INTRAMUSCULAR | Status: DC

## 2014-09-11 MED ORDER — KCL IN DEXTROSE-NACL 20-5-0.9 MEQ/L-%-% IV SOLN
INTRAVENOUS | Status: DC
Start: 2014-09-11 — End: 2014-09-14
  Administered 2014-09-11 – 2014-09-14 (×7): via INTRAVENOUS
  Filled 2014-09-11 (×5): qty 1000

## 2014-09-11 MED ORDER — HYDROMORPHONE HCL 1 MG/ML IJ SOLN
1.0000 mg | Freq: Once | INTRAMUSCULAR | Status: AC
  Administered 2014-09-11: 1 mg via INTRAVENOUS
  Filled 2014-09-11: qty 1

## 2014-09-11 MED ORDER — PROMETHAZINE HCL 25 MG/ML IJ SOLN
12.5000 mg | Freq: Once | INTRAMUSCULAR | Status: AC
  Administered 2014-09-11: 12.5 mg via INTRAVENOUS
  Filled 2014-09-11: qty 1

## 2014-09-11 MED ORDER — METOCLOPRAMIDE HCL 5 MG/ML IJ SOLN
10.0000 mg | Freq: Once | INTRAMUSCULAR | Status: AC
  Administered 2014-09-11: 10 mg via INTRAVENOUS
  Filled 2014-09-11: qty 2

## 2014-09-11 MED ORDER — ATENOLOL 25 MG PO TABS
25.0000 mg | ORAL_TABLET | Freq: Every day | ORAL | Status: DC
  Administered 2014-09-11 – 2014-09-14 (×4): 25 mg via ORAL
  Filled 2014-09-11 (×4): qty 1

## 2014-09-11 MED ORDER — NICOTINE 14 MG/24HR TD PT24
14.0000 mg | MEDICATED_PATCH | Freq: Once | TRANSDERMAL | Status: AC
Start: 2014-09-11 — End: 2014-09-12
  Administered 2014-09-11: 14 mg via TRANSDERMAL
  Filled 2014-09-11: qty 1

## 2014-09-11 MED ORDER — ZOLPIDEM TARTRATE 5 MG PO TABS: 10.0000 mg | ORAL_TABLET | Freq: Every evening | ORAL | Status: DC | PRN

## 2014-09-11 MED ORDER — OMEGA-3 FATTY ACIDS 1000 MG PO CAPS: 1.0000 g | ORAL_CAPSULE | Freq: Every day | ORAL | Status: DC

## 2014-09-11 NOTE — ED Notes (Signed)
Having abdominal pain since 09/08/14.  Continues to have pain with Nausea and vomiting.  Being having hiccups since started on Zofran on Wed.  Rates pain 10/10 with cramps.  Denies diarrhea.  Stools are black.

## 2014-09-11 NOTE — H&P (Signed)
[facility] History and Physical  [name] UQJ:[id number] DOB: 01-19-1946    PCP:   Purvis Kilts, MD   Chief Complaint: Persistent nausea, vomiting and abdominal cramps.   HPI: [name] is an 69 y.o. female with hx of HTN, DM, Gout, arthritis, returned to the ER again as she has persistent nausea, vomiting, and abdominal pain, unable to tolerate oral fluid and food.  She was evaluated in the ER, felt to have a benign process, with negative abdominal CT (except the enlarging kidney mass suspicious for renal cell carcinoma needing follow up with Dr Gaynelle Arabian),, no leukocytosis, and was sent home. She denied RUQ pain, black or bloody stool, and her diarrhea has now soft stool.  She was not started on any new medication, except for atenelol.  She had no ill contact or distant travel.  She denied being on antibiotics recently.  Work up today showed normal lipase, normal LFTs, a little hyponatremia, and slight elevation of her Cr.  She still has no leukocytosis, and hospitalist was asked to admit her for abdominal pain, diarrhea, nausea, and vomiting.   Rewiew of Systems:  Constitutional: Negative for malaise, fever and chills. No significant weight loss or weight gain Eyes: Negative for eye pain, redness and discharge, diplopia, visual changes, or flashes of light. ENMT: Negative for ear pain, hoarseness, nasal congestion, sinus pressure and sore throat. No headaches; tinnitus, drooling, or problem swallowing. Cardiovascular: Negative for chest pain, palpitations, diaphoresis, dyspnea and peripheral edema. ; No orthopnea, PND Respiratory: Negative for cough, hemoptysis, wheezing and stridor. No pleuritic chestpain. Gastrointestinal: Negative for constipation,melena, blood in stool, hematemesis, jaundice and rectal bleeding.    Genitourinary: Negative for frequency, dysuria, incontinence,flank pain and hematuria; Musculoskeletal: Negative for back pain and neck pain. Negative  for swelling and trauma.;  Skin: . Negative for pruritus, rash, abrasions, bruising and skin lesion.; ulcerations Neuro: Negative for headache, lightheadedness and neck stiffness. Negative for weakness, altered level of consciousness , altered mental status, extremity weakness, burning feet, involuntary movement, seizure and syncope.  Psych: negative for anxiety, depression, insomnia, tearfulness, panic attacks, hallucinations, paranoia, suicidal or homicidal ideation    Past Medical History  Diagnosis Date  . Hypertension   . Diabetes mellitus     non insulin  . Arthritis   . History of blood clots   . Homocysteinemia 11/29/2010  . History of gout     right ring finger  . Left knee pain   . Kidney atrophy     rt.    Past Surgical History  Procedure Laterality Date  . Abdominal hysterectomy    . Knee surgery      rt. arthroscopic  . Tonsillectomy    . Hypertension    . Type ll diabetes    . Neck surgery    . Esophagogastroduodenoscopy N/A 07/02/2012    Procedure: ESOPHAGOGASTRODUODENOSCOPY (EGD);  Surgeon: Rogene Houston, MD;  Location: [facility];  Service: Endoscopy;  Laterality: N/A;    Medications:  HOME MEDS: Prior to Admission medications   Medication Sig Start Date End Date Taking? Authorizing Provider  acetaminophen (TYLENOL) 500 MG tablet Take 500 mg by mouth every 6 (six) hours as needed for pain. Takes 2 as needed for discomfort   Yes Historical Provider, MD  Ascorbic Acid (VITAMIN C) 1000 MG tablet Take 1,000 mg by mouth daily.   Yes Historical Provider, MD  aspirin 81 MG tablet Take 81 mg by mouth every evening.    Yes Historical  Provider, MD  atenolol (TENORMIN) 25 MG tablet Take 1 tablet (25 mg total) by mouth daily. 08/13/14  Yes Mihai Croitoru, MD  b complex vitamins tablet Take 1 tablet by mouth daily. Takes 2 in the morning.   Yes Historical Provider, MD  Cholecalciferol (VITAMIN D3) 1000 UNITS CAPS Take 1 tablet by mouth daily.     Yes Historical  Provider, MD  fish oil-omega-3 fatty acids 1000 MG capsule Take 1 g by mouth daily.    Yes Historical Provider, MD  Flaxseed, Linseed, 1000 MG CAPS Take 1 capsule by mouth daily.   Yes Historical Provider, MD  folic acid (FOLVITE) 1 MG tablet TAKE ONE (1) TABLET BY MOUTH EVERY DAY 09/07/13  Yes Manon Hilding Kefalas, PA-C  glimepiride (AMARYL) 2 MG tablet Take 2 mg by mouth 2 (two) times daily.   Yes Historical Provider, MD  lidocaine (LIDODERM) 5 % Place 1 patch onto the skin daily. Remove & Discard patch within 12 hours or as directed by MD   Yes Historical Provider, MD  linagliptin (TRADJENTA) 5 MG TABS tablet Take 5 mg by mouth daily.     Yes Historical Provider, MD  metFORMIN (GLUCOPHAGE) 500 MG tablet Take 500 mg by mouth 2 (two) times daily with a meal.   Yes Historical Provider, MD  NIFEdipine (PROCARDIA XL/ADALAT-CC) 60 MG 24 hr tablet Take 60 mg by mouth daily.     Yes Historical Provider, MD  oxyCODONE-acetaminophen (PERCOCET/ROXICET) 5-325 MG per tablet Take 1 tablet by mouth daily as needed for moderate pain.  06/18/14  Yes Historical Provider, MD  pantoprazole (PROTONIX) 40 MG tablet Take 40 mg by mouth daily.   Yes Historical Provider, MD  pioglitazone (ACTOS) 30 MG tablet Take 30 mg by mouth daily.     Yes Historical Provider, MD  simvastatin (ZOCOR) 20 MG tablet Take 20 mg by mouth At bedtime.   Yes Historical Provider, MD  zolpidem (AMBIEN) 10 MG tablet Take 10 mg by mouth at bedtime as needed for sleep.   Yes Historical Provider, MD  ondansetron (ZOFRAN) 4 MG tablet Take 1 tablet (4 mg total) by mouth every 6 (six) hours. Patient not taking: Reported on 09/11/2014 09/09/14   Ezequiel Essex, MD     Allergies:  Allergies  Allergen Reactions  . Ace Inhibitors Swelling  . Celecoxib Other (See Comments)    REACTION: Kidney issue  . Hydrocodone-Acetaminophen Itching    BRAND NAME: VICODIN  . Zofran [Ondansetron Hcl]     hiccups    Social History:   reports that she has been smoking  Cigarettes.  She has a 25 pack-year smoking history. She has never used smokeless tobacco. She reports that she drinks alcohol. She reports that she does not use illicit drugs.  Family History: Family History  Problem Relation Age of Onset  . Stroke Mother   . Hypertension Mother   . Prostate cancer Father   . Diabetes Maternal Aunt   . Diabetes Maternal Uncle   . Diabetes Maternal Grandmother   . Diabetes Son      Physical Exam: Filed Vitals:   09/11/14 1314 09/11/14 1330 09/11/14 1400 09/11/14 1430  BP: 149/82 120/68 113/70 123/74  Pulse: 66 63 65 60  Temp:      TempSrc:      Resp: 16     Height:      Weight:      SpO2: 97% 97% 94% 92%   Blood pressure 123/74, pulse 60, temperature 99.6 F (37.6  C), temperature source Rectal, resp. rate 16, height 5\' 10"  (1.778 m), weight 97.523 kg (215 lb), SpO2 92 %.  GEN:  Pleasant patient lying in the stretcher in no acute distress; cooperative with exam. PSYCH:  alert and oriented x4; does not appear anxious or depressed; affect is appropriate. HEENT: Mucous membranes pink and anicteric; PERRLA; EOM intact; no cervical lymphadenopathy nor thyromegaly or carotid bruit; no JVD; There were no stridor. Neck is very supple. Breasts:: Not examined CHEST WALL: No tenderness CHEST: Normal respiration, clear to auscultation bilaterally.  HEART: Regular rate and rhythm.  There are no murmur, rub, or gallops.   BACK: No kyphosis or scoliosis; no CVA tenderness ABDOMEN: soft and non-tender; no masses, no organomegaly, normal abdominal bowel sounds; no pannus; no intertriginous candida. There is no rebound and no distention. Rectal Exam: Not done EXTREMITIES: No bone or joint deformity; age-appropriate arthropathy of the hands and knees; no edema; no ulcerations.  There is no calf tenderness. Genitalia: not examined PULSES: 2+ and symmetric SKIN: Normal hydration no rash or ulceration CNS: Cranial nerves 2-12 grossly intact no focal lateralizing  neurologic deficit.  Speech is fluent; uvula elevated with phonation, facial symmetry and tongue midline. DTR are normal bilaterally, cerebella exam is intact, barbinski is negative and strengths are equaled bilaterally.  No sensory loss.   Labs on Admission:  Basic Metabolic Panel:  Recent Labs Lab 09/08/14 2244 09/11/14 0930  NA 138 129*  K 3.9 3.3*  CL 97* 87*  CO2 27 26  GLUCOSE 239* 161*  BUN 17 16  CREATININE 1.23* 1.47*  CALCIUM 9.9 10.2   Liver Function Tests:  Recent Labs Lab 09/08/14 2244 09/11/14 0930  AST 18 17  ALT 15 14  ALKPHOS 70 76  BILITOT 0.6 0.8  PROT 8.0 8.7*  ALBUMIN 4.1 4.2    Recent Labs Lab 09/08/14 2244 09/11/14 0930  LIPASE 21* 18*   No results for input(s): AMMONIA in the last 168 hours. CBC:  Recent Labs Lab 09/08/14 2244 09/11/14 0930  WBC 11.0* 7.9  NEUTROABS 9.6* 5.5  HGB 13.2 15.4*  HCT 39.3 44.8  MCV 90.1 89.2  PLT 216 217   Cardiac Enzymes:  Recent Labs Lab 09/08/14 2244  TROPONINI <0.03    CBG:  Recent Labs Lab 09/08/14 2231 09/09/14 0457  GLUCAP 220* 186*     Radiological Exams on Admission: US Abdomen Limited Ruq  09/11/2014   CLINICAL DATA:  68 year old female with abdominal pain since 09/08/2014, currently with nausea and vomiting.  EXAM: US ABDOMEN LIMITED - RIGHT UPPER QUADRANT  COMPARISON:  CT of the abdomen and pelvis 09/09/2014.  FINDINGS: Gallbladder:  Multiple small echogenic foci with small amount of shadowing in the gallbladder, compatible with gallstones and/or biliary sludge balls. These are mobile in measure up to 1.1 cm. Gallbladder is only moderately distended, with no gallbladder wall thickening (wall thickness measures 1.6 mm), or pericholecystic fluid. Per the sonographer, there was no sonographic Murphy's sign on examination.  Common bile duct:  Diameter: 3.8 mm.  Liver:  No focal lesion identified. Within normal limits in parenchymal echogenicity.  IMPRESSION: 1. Study positive for  cholelithiasis without evidence to suggest acute cholecystitis at this time.   Electronically Signed   By: Vinnie Langton M.D.   On: 09/11/2014 13:21    Assessment/Plan Present on Admission:  . Right kidney mass . Essential hypertension . Dehydration . Chronic back pain . Abdominal pain  PLAN: Will admit her for abdominal pain, nausea, vomiting and  diarrhea.  She likely has a viral gastroenteritis.  Will send stool for C diff, and cultures.  For her increasing renal mass, she will need to follow up with Dr Gaynelle Arabian.  Will give clear liquid, and give IVF with KCl.  She is stable, full code, and will be admitted to [facility] service.  Thank you for allowing [place] to participate in her care.   Other plans as per orders.  Code Status: FULL Haskel Khan, MD. [facility] Pager [phone] 7pm to 7am.  09/11/2014, 3:13 PM

## 2014-09-11 NOTE — ED Notes (Signed)
Pt. To U/S 

## 2014-09-11 NOTE — ED Provider Notes (Signed)
CSN: [id number]     Arrival date & time 09/11/14  0810 History   First MD Initiated Contact with Patient 09/11/14 [phone]     Chief Complaint  Patient presents with  . Abdominal Pain  . Nausea  . Emesis     (Consider location/radiation/quality/duration/timing/severity/associated sxs/prior Treatment) The history is provided by the patient and the spouse.   [name] is a 69 y.o. female the past medical history of diabetes and hypertension presenting for a recheck today after being seen here 2 days ago for complaint of abdominal pain, vomiting with nausea and diarrhea.  Since her last visit she has had persistent periumbilical abdominal pain which is not improved.  She continues to be nausea did with multiple episodes of vomiting yesterday, none today.  Her diarrhea has resolved however.  She describes having loose stools at her first presentation 2 days ago but none since.  Last night she had an enema suspecting she may be constipated and this resulted in watery black stool but none since.  She endorses generalized weakness and feels dehydrated.  She has not checked her blood sugar today nor is she taking any of her home medications.  She denies fevers but has felt clammy.  She has continued using her home Zofran although this has not relieved her symptoms. She has also taken doses of Tums without relief.  She attempted to eat Jell-O and applesauce yesterday which she vomited, but has been able to keep down small sips of water and ginger ale.  Her labs from 2 days ago were reviewed with an elevated glucose and WBC count of 11.0.  CT scanning performed reveals gallstones without evidence for acute cholecystectomy and a right renal mass which is larger than imaging performed in 2014 and concerning for possible renal cell carcinoma.  She has been scheduled for an ultrasound for this and is planning follow-up care with urology.  Past Medical History  Diagnosis Date  . Hypertension   . Diabetes  mellitus     non insulin  . Arthritis   . History of blood clots   . Homocysteinemia 11/29/2010  . History of gout     right ring finger  . Left knee pain   . Kidney atrophy     rt.   Past Surgical History  Procedure Laterality Date  . Abdominal hysterectomy    . Knee surgery      rt. arthroscopic  . Tonsillectomy    . Hypertension    . Type ll diabetes    . Neck surgery    . Esophagogastroduodenoscopy N/A 07/02/2012    Procedure: ESOPHAGOGASTRODUODENOSCOPY (EGD);  Surgeon: Rogene Houston, MD;  Location: [facility];  Service: Endoscopy;  Laterality: N/A;   Family History  Problem Relation Age of Onset  . Stroke Mother   . Hypertension Mother   . Prostate cancer Father   . Diabetes Maternal Aunt   . Diabetes Maternal Uncle   . Diabetes Maternal Grandmother   . Diabetes Son    History  Substance Use Topics  . Smoking status: Current Every Day Smoker -- 0.50 packs/day for 50 years    Types: Cigarettes  . Smokeless tobacco: Never Used  . Alcohol Use: Yes     Comment: occasionally   OB History    No data available     Review of Systems  Constitutional: Positive for chills. Negative for fever.  HENT: Negative for congestion and sore throat.   Eyes: Negative.   Respiratory:  Negative for chest tightness and shortness of breath.   Cardiovascular: Negative for chest pain.  Gastrointestinal: Positive for nausea, vomiting and abdominal pain.  Genitourinary: Negative.  Negative for dysuria and hematuria.  Musculoskeletal: Negative for joint swelling, arthralgias and neck pain.  Skin: Negative.  Negative for rash and wound.  Neurological: Positive for weakness. Negative for dizziness, light-headedness, numbness and headaches.  Psychiatric/Behavioral: Negative.       Allergies  Ace inhibitors; Celecoxib; Hydrocodone-acetaminophen; and Zofran  Home Medications   Prior to Admission medications   Medication Sig Start Date End Date Taking? Authorizing Provider   acetaminophen (TYLENOL) 500 MG tablet Take 500 mg by mouth every 6 (six) hours as needed for pain. Takes 2 as needed for discomfort   Yes Historical Provider, MD  Ascorbic Acid (VITAMIN C) 1000 MG tablet Take 1,000 mg by mouth daily.   Yes Historical Provider, MD  aspirin 81 MG tablet Take 81 mg by mouth every evening.    Yes Historical Provider, MD  atenolol (TENORMIN) 25 MG tablet Take 1 tablet (25 mg total) by mouth daily. 08/13/14  Yes Mihai Croitoru, MD  b complex vitamins tablet Take 1 tablet by mouth daily. Takes 2 in the morning.   Yes Historical Provider, MD  Cholecalciferol (VITAMIN D3) 1000 UNITS CAPS Take 1 tablet by mouth daily.     Yes Historical Provider, MD  fish oil-omega-3 fatty acids 1000 MG capsule Take 1 g by mouth daily.    Yes Historical Provider, MD  Flaxseed, Linseed, 1000 MG CAPS Take 1 capsule by mouth daily.   Yes Historical Provider, MD  folic acid (FOLVITE) 1 MG tablet TAKE ONE (1) TABLET BY MOUTH EVERY DAY 09/07/13  Yes Manon Hilding Kefalas, PA-C  glimepiride (AMARYL) 2 MG tablet Take 2 mg by mouth 2 (two) times daily.   Yes Historical Provider, MD  lidocaine (LIDODERM) 5 % Place 1 patch onto the skin daily. Remove & Discard patch within 12 hours or as directed by MD   Yes Historical Provider, MD  linagliptin (TRADJENTA) 5 MG TABS tablet Take 5 mg by mouth daily.     Yes Historical Provider, MD  metFORMIN (GLUCOPHAGE) 500 MG tablet Take 500 mg by mouth 2 (two) times daily with a meal.   Yes Historical Provider, MD  NIFEdipine (PROCARDIA XL/ADALAT-CC) 60 MG 24 hr tablet Take 60 mg by mouth daily.     Yes Historical Provider, MD  oxyCODONE-acetaminophen (PERCOCET/ROXICET) 5-325 MG per tablet Take 1 tablet by mouth daily as needed for moderate pain.  06/18/14  Yes Historical Provider, MD  pantoprazole (PROTONIX) 40 MG tablet Take 40 mg by mouth daily.   Yes Historical Provider, MD  pioglitazone (ACTOS) 30 MG tablet Take 30 mg by mouth daily.     Yes Historical Provider, MD   simvastatin (ZOCOR) 20 MG tablet Take 20 mg by mouth At bedtime.   Yes Historical Provider, MD  zolpidem (AMBIEN) 10 MG tablet Take 10 mg by mouth at bedtime as needed for sleep.   Yes Historical Provider, MD  ondansetron (ZOFRAN) 4 MG tablet Take 1 tablet (4 mg total) by mouth every 6 (six) hours. Patient not taking: Reported on 09/11/2014 09/09/14   Ezequiel Essex, MD   BP 125/67 mmHg  Pulse 66  Temp(Src) 98.4 F (36.9 C) (Oral)  Resp 18  Ht 5\' 10"  (1.778 m)  Wt 215 lb (97.523 kg)  BMI 30.85 kg/m2  SpO2 98% Physical Exam  Constitutional: She appears well-developed and well-nourished.  HENT:  Head: Normocephalic and atraumatic.  Mouth/Throat: Mucous membranes are dry.  Eyes: Conjunctivae are normal.  Neck: Normal range of motion.  Cardiovascular: Normal rate, regular rhythm, normal heart sounds and intact distal pulses.   Pulmonary/Chest: Effort normal and breath sounds normal. She has no wheezes.  Abdominal: Soft. Bowel sounds are normal. There is tenderness in the epigastric area and periumbilical area. There is no rigidity, no rebound, no guarding and negative Murphy's sign.  Musculoskeletal: Normal range of motion.  Neurological: She is alert.  Skin: Skin is warm and dry.  Psychiatric: She has a normal mood and affect.  Nursing note and vitals reviewed.   ED Course  Procedures (including critical care time) Labs Review Labs Reviewed  CBC WITH DIFFERENTIAL/PLATELET - Abnormal; Notable for the following:    Hemoglobin 15.4 (*)    All other components within normal limits  COMPREHENSIVE METABOLIC PANEL - Abnormal; Notable for the following:    Sodium 129 (*)    Potassium 3.3 (*)    Chloride 87 (*)    Glucose, Bld 161 (*)    Creatinine, Ser 1.47 (*)    Total Protein 8.7 (*)    GFR calc non Af Amer 35 (*)    GFR calc Af Amer 40 (*)    Anion gap 16 (*)    All other components within normal limits  URINALYSIS, ROUTINE W REFLEX MICROSCOPIC (NOT AT [facility]) - Abnormal;  Notable for the following:    Specific Gravity, Urine <1.005 (*)    Hgb urine dipstick TRACE (*)    Protein, ur 30 (*)    All other components within normal limits  LIPASE, BLOOD - Abnormal; Notable for the following:    Lipase 18 (*)    All other components within normal limits  URINE MICROSCOPIC-ADD ON - Abnormal; Notable for the following:    Squamous Epithelial / LPF FEW (*)    All other components within normal limits  COMPREHENSIVE METABOLIC PANEL - Abnormal; Notable for the following:    Sodium 134 (*)    Potassium 3.2 (*)    Chloride 99 (*)    Glucose, Bld 179 (*)    BUN 22 (*)    Creatinine, Ser 1.59 (*)    Calcium 8.8 (*)    Total Protein 6.3 (*)    Albumin 3.1 (*)    AST 14 (*)    ALT 12 (*)    GFR calc non Af Amer 32 (*)    GFR calc Af Amer 37 (*)    All other components within normal limits  GLUCOSE, CAPILLARY - Abnormal; Notable for the following:    Glucose-Capillary 135 (*)    All other components within normal limits  GLUCOSE, CAPILLARY - Abnormal; Notable for the following:    Glucose-Capillary 164 (*)    All other components within normal limits  CLOSTRIDIUM DIFFICILE BY PCR (NOT AT [facility])  STOOL CULTURE  TSH  CBC  HEMOGLOBIN A1C  POC OCCULT BLOOD, ED    Imaging Review US Abdomen Limited Ruq  09/11/2014   CLINICAL DATA:  69 year old female with abdominal pain since 09/08/2014, currently with nausea and vomiting.  EXAM: US ABDOMEN LIMITED - RIGHT UPPER QUADRANT  COMPARISON:  CT of the abdomen and pelvis 09/09/2014.  FINDINGS: Gallbladder:  Multiple small echogenic foci with small amount of shadowing in the gallbladder, compatible with gallstones and/or biliary sludge balls. These are mobile in measure up to 1.1 cm. Gallbladder is only moderately distended, with no gallbladder wall thickening (wall  thickness measures 1.6 mm), or pericholecystic fluid. Per the sonographer, there was no sonographic Murphy's sign on examination.  Common bile duct:  Diameter: 3.8  mm.  Liver:  No focal lesion identified. Within normal limits in parenchymal echogenicity.  IMPRESSION: 1. Study positive for cholelithiasis without evidence to suggest acute cholecystitis at this time.   Electronically Signed   By: Vinnie Langton M.D.   On: 09/11/2014 13:21     EKG Interpretation None      MDM   Final diagnoses:  Abdominal pain  Non-intractable vomiting with nausea, vomiting of unspecified type   Patients labs and/or radiological studies were reviewed and considered during the medical decision making and disposition process.  Results were also discussed with patient. Pt has received IV fluids, phenergan, morphine with persistent pain and nausea.  Added reglan, dilaudid 1 mg IV, pain still 7/10, nausea present but no further emesis.  Pt continues to feel weak.   Discussed with Dr. Lacinda Axon who also saw pt. 2nd ed visit in 3 days, no improvement, dehydration. Unclear etiology for pain, possible gastritis/ ? Foodborne.  Will plan admission.  Pepcid IV ordered, additional IV fluids.    Evalee Jefferson, PA-C 21/22/48 2500  Uncertain etiology of patient's abdominal pain. Second visit to the ED in 3 days.  Admit to general medicine  Nat Christen, MD 09/12/14 [phone]

## 2014-09-12 DIAGNOSIS — R1084 Generalized abdominal pain: Secondary | ICD-10-CM

## 2014-09-12 DIAGNOSIS — M549 Dorsalgia, unspecified: Secondary | ICD-10-CM | POA: Diagnosis not present

## 2014-09-12 DIAGNOSIS — E860 Dehydration: Secondary | ICD-10-CM | POA: Diagnosis not present

## 2014-09-12 DIAGNOSIS — I10 Essential (primary) hypertension: Secondary | ICD-10-CM | POA: Diagnosis not present

## 2014-09-12 LAB — CBC
HCT: 38.6 % (ref 36.0–46.0)
Hemoglobin: 12.7 g/dL (ref 12.0–15.0)
MCH: 29.5 pg (ref 26.0–34.0)
MCHC: 32.9 g/dL (ref 30.0–36.0)
MCV: 89.6 fL (ref 78.0–100.0)
Platelets: 201 10*3/uL (ref 150–400)
RBC: 4.31 MIL/uL (ref 3.87–5.11)
RDW: 14.4 % (ref 11.5–15.5)
WBC: 6.5 10*3/uL (ref 4.0–10.5)

## 2014-09-12 LAB — GLUCOSE, CAPILLARY
GLUCOSE-CAPILLARY: 147 mg/dL — AB (ref 65–99)
GLUCOSE-CAPILLARY: 193 mg/dL — AB (ref 65–99)
GLUCOSE-CAPILLARY: 242 mg/dL — AB (ref 65–99)
Glucose-Capillary: 164 mg/dL — ABNORMAL HIGH (ref 65–99)
Glucose-Capillary: 157 mg/dL — ABNORMAL HIGH (ref 65–99)

## 2014-09-12 LAB — COMPREHENSIVE METABOLIC PANEL
ALBUMIN: 3.1 g/dL — AB (ref 3.5–5.0)
ALK PHOS: 56 U/L (ref 38–126)
ALT: 12 U/L — ABNORMAL LOW (ref 14–54)
ANION GAP: 10 (ref 5–15)
AST: 14 U/L — AB (ref 15–41)
BILIRUBIN TOTAL: 0.7 mg/dL (ref 0.3–1.2)
BUN: 22 mg/dL — ABNORMAL HIGH (ref 6–20)
CALCIUM: 8.8 mg/dL — AB (ref 8.9–10.3)
CO2: 25 mmol/L (ref 22–32)
Chloride: 99 mmol/L — ABNORMAL LOW (ref 101–111)
Creatinine, Ser: 1.59 mg/dL — ABNORMAL HIGH (ref 0.44–1.00)
GFR, EST AFRICAN AMERICAN: 37 mL/min — AB (ref >60)
GFR, EST NON AFRICAN AMERICAN: 32 mL/min — AB (ref >60)
Glucose, Bld: 179 mg/dL — ABNORMAL HIGH (ref 65–99)
Potassium: 3.2 mmol/L — ABNORMAL LOW (ref 3.5–5.1)
SODIUM: 134 mmol/L — AB (ref 135–145)
Total Protein: 6.3 g/dL — ABNORMAL LOW (ref 6.5–8.1)

## 2014-09-12 MED ORDER — PROMETHAZINE HCL 25 MG/ML IJ SOLN
12.5000 mg | Freq: Once | INTRAMUSCULAR | Status: AC
  Administered 2014-09-13: 12.5 mg via INTRAVENOUS
  Filled 2014-09-12: qty 1

## 2014-09-12 MED ORDER — NICOTINE 14 MG/24HR TD PT24
14.0000 mg | MEDICATED_PATCH | Freq: Every day | TRANSDERMAL | Status: DC
  Administered 2014-09-12 – 2014-09-14 (×3): 14 mg via TRANSDERMAL
  Filled 2014-09-12 (×3): qty 1

## 2014-09-12 MED ORDER — PROMETHAZINE HCL 25 MG/ML IJ SOLN
12.5000 mg | Freq: Three times a day (TID) | INTRAMUSCULAR | Status: DC
  Administered 2014-09-12 – 2014-09-14 (×7): 12.5 mg via INTRAVENOUS
  Filled 2014-09-12 (×7): qty 1

## 2014-09-12 MED ORDER — PANTOPRAZOLE SODIUM 40 MG PO TBEC
40.0000 mg | DELAYED_RELEASE_TABLET | Freq: Two times a day (BID) | ORAL | Status: DC
  Administered 2014-09-12 – 2014-09-14 (×5): 40 mg via ORAL
  Filled 2014-09-12 (×5): qty 1

## 2014-09-12 MED ORDER — SODIUM CHLORIDE 0.9 % IV SOLN
INTRAVENOUS | Status: DC
  Administered 2014-09-13: 11:00:00 via INTRAVENOUS

## 2014-09-12 MED ORDER — HYDROMORPHONE HCL 1 MG/ML IJ SOLN
0.5000 mg | Freq: Three times a day (TID) | INTRAMUSCULAR | Status: DC
  Administered 2014-09-12 – 2014-09-14 (×7): 0.5 mg via INTRAVENOUS
  Filled 2014-09-12 (×6): qty 1

## 2014-09-12 MED ORDER — POLYETHYLENE GLYCOL 3350 17 G PO PACK
17.0000 g | PACK | Freq: Two times a day (BID) | ORAL | Status: DC
  Administered 2014-09-12 – 2014-09-14 (×5): 17 g via ORAL
  Filled 2014-09-12 (×7): qty 1

## 2014-09-12 MED ORDER — HYDROMORPHONE HCL 1 MG/ML IJ SOLN: 0.5000 mg | Freq: Three times a day (TID) | INTRAMUSCULAR | Status: DC

## 2014-09-12 NOTE — Progress Notes (Signed)
[facility] PROGRESS NOTE  [name] OHY:[id number] DOB: [birth year]    PCP:   Purvis Kilts, MD   HPI: [name] is an 68 y.o. female admitted yesterday for abdominal pain, nausea, vomiting, and soft stool.  Prior, she had diarrhea, but her stool is formed now.  Her CT without contrast of the abd pelvic really didn't show anything acute.  She has an enlarging renal mass raising the possibility of RCC.  Her work up was negative, but she complained of increasing abdominal pain diffusely, and nausea.  She does have DM, and elevated Homocysteine level, and have been on folate.    Rewiew of Systems:  Constitutional: Negative for malaise, fever and chills. No significant weight loss or weight gain Eyes: Negative for eye pain, redness and discharge, diplopia, visual changes, or flashes of light. ENMT: Negative for ear pain, hoarseness, nasal congestion, sinus pressure and sore throat. No headaches; tinnitus, drooling, or problem swallowing. Cardiovascular: Negative for chest pain, palpitations, diaphoresis, dyspnea and peripheral edema. ; No orthopnea, PND Respiratory: Negative for cough, hemoptysis, wheezing and stridor. No pleuritic chestpain. Gastrointestinal: Negative for nausea, vomiting, diarrhea, constipation,  melena, blood in stool, hematemesis, jaundice and rectal bleeding.    Genitourinary: Negative for frequency, dysuria, incontinence,flank pain and hematuria; Musculoskeletal: Negative for back pain and neck pain. Negative for swelling and trauma.;  Skin: . Negative for pruritus, rash, abrasions, bruising and skin lesion.; ulcerations Neuro: Negative for headache, lightheadedness and neck stiffness. Negative for weakness, altered level of consciousness , altered mental status, extremity weakness, burning feet, involuntary movement, seizure and syncope.  Psych: negative for anxiety, depression, insomnia, tearfulness, panic attacks, hallucinations, paranoia, suicidal or  homicidal ideation    Past Medical History  Diagnosis Date  . Hypertension   . Diabetes mellitus     non insulin  . Arthritis   . History of blood clots   . Homocysteinemia 11/29/2010  . History of gout     right ring finger  . Left knee pain   . Kidney atrophy     rt.    Past Surgical History  Procedure Laterality Date  . Abdominal hysterectomy    . Knee surgery      rt. arthroscopic  . Tonsillectomy    . Hypertension    . Type ll diabetes    . Neck surgery    . Esophagogastroduodenoscopy N/A 07/02/2012    Procedure: ESOPHAGOGASTRODUODENOSCOPY (EGD);  Surgeon: Rogene Houston, MD;  [facility];  Service: Endoscopy;  Laterality: N/A;    Medications:  HOME MEDS: Prior to Admission medications   Medication Sig Start Date End Date Taking? Authorizing Provider  acetaminophen (TYLENOL) 500 MG tablet Take 500 mg by mouth every 6 (six) hours as needed for pain. Takes 2 as needed for discomfort   Yes Historical Provider, MD  Ascorbic Acid (VITAMIN C) 1000 MG tablet Take 1,000 mg by mouth daily.   Yes Historical Provider, MD  aspirin 81 MG tablet Take 81 mg by mouth every evening.    Yes Historical Provider, MD  atenolol (TENORMIN) 25 MG tablet Take 1 tablet (25 mg total) by mouth daily. 08/13/14  Yes Mihai Croitoru, MD  b complex vitamins tablet Take 1 tablet by mouth daily. Takes 2 in the morning.   Yes Historical Provider, MD  Cholecalciferol (VITAMIN D3) 1000 UNITS CAPS Take 1 tablet by mouth daily.     Yes Historical Provider, MD  fish oil-omega-3 fatty acids 1000 MG capsule Take 1  g by mouth daily.    Yes Historical Provider, MD  Flaxseed, Linseed, 1000 MG CAPS Take 1 capsule by mouth daily.   Yes Historical Provider, MD  folic acid (FOLVITE) 1 MG tablet TAKE ONE (1) TABLET BY MOUTH EVERY DAY 09/07/13  Yes Manon Hilding Kefalas, PA-C  glimepiride (AMARYL) 2 MG tablet Take 2 mg by mouth 2 (two) times daily.   Yes Historical Provider, MD  lidocaine (LIDODERM) 5 % Place 1  patch onto the skin daily. Remove & Discard patch within 12 hours or as directed by MD   Yes Historical Provider, MD  linagliptin (TRADJENTA) 5 MG TABS tablet Take 5 mg by mouth daily.     Yes Historical Provider, MD  metFORMIN (GLUCOPHAGE) 500 MG tablet Take 500 mg by mouth 2 (two) times daily with a meal.   Yes Historical Provider, MD  NIFEdipine (PROCARDIA XL/ADALAT-CC) 60 MG 24 hr tablet Take 60 mg by mouth daily.     Yes Historical Provider, MD  oxyCODONE-acetaminophen (PERCOCET/ROXICET) 5-325 MG per tablet Take 1 tablet by mouth daily as needed for moderate pain.  06/18/14  Yes Historical Provider, MD  pantoprazole (PROTONIX) 40 MG tablet Take 40 mg by mouth daily.   Yes Historical Provider, MD  pioglitazone (ACTOS) 30 MG tablet Take 30 mg by mouth daily.     Yes Historical Provider, MD  simvastatin (ZOCOR) 20 MG tablet Take 20 mg by mouth At bedtime.   Yes Historical Provider, MD  zolpidem (AMBIEN) 10 MG tablet Take 10 mg by mouth at bedtime as needed for sleep.   Yes Historical Provider, MD  ondansetron (ZOFRAN) 4 MG tablet Take 1 tablet (4 mg total) by mouth every 6 (six) hours. Patient not taking: Reported on 09/11/2014 09/09/14   Ezequiel Essex, MD     Allergies:  Allergies  Allergen Reactions  . Ace Inhibitors Swelling  . Celecoxib Other (See Comments)    REACTION: Kidney issue  . Hydrocodone-Acetaminophen Itching    BRAND NAME: VICODIN  . Zofran [Ondansetron Hcl]     hiccups    Social History:   reports that she has been smoking Cigarettes.  She has a 25 pack-year smoking history. She has never used smokeless tobacco. She reports that she drinks alcohol. She reports that she does not use illicit drugs.  Family History: Family History  Problem Relation Age of Onset  . Stroke Mother   . Hypertension Mother   . Prostate cancer Father   . Diabetes Maternal Aunt   . Diabetes Maternal Uncle   . Diabetes Maternal Grandmother   . Diabetes Son      Physical Exam: Filed  Vitals:   09/11/14 1700 09/11/14 1832 09/11/14 2211 09/12/14 0540  BP: 107/88  130/61 125/67  Pulse: 70  56 66  Temp: 98.7 F (37.1 C) 100.2 F (37.9 C) 98.3 F (36.8 C) 98.4 F (36.9 C)  TempSrc: Oral Oral Oral Oral  Resp: 20  20 18   Height: 5\' 10"  (1.778 m)     Weight: 97.523 kg (215 lb)     SpO2:   97% 98%   Blood pressure 125/67, pulse 66, temperature 98.4 F (36.9 C), temperature source Oral, resp. rate 18, height 5\' 10"  (1.778 m), weight 97.523 kg (215 lb), SpO2 98 %.  GEN:  Pleasant  patient lying in the stretcher in no acute distress; cooperative with exam. PSYCH:  alert and oriented x4; does not appear anxious or depressed; affect is appropriate. HEENT: Mucous membranes pink and anicteric;  PERRLA; EOM intact; no cervical lymphadenopathy nor thyromegaly or carotid bruit; no JVD; There were no stridor. Neck is very supple. Breasts:: Not examined CHEST WALL: No tenderness CHEST: Normal respiration, clear to auscultation bilaterally.  HEART: Regular rate and rhythm.  There are no murmur, rub, or gallops.   BACK: No kyphosis or scoliosis; no CVA tenderness ABDOMEN: soft and non-tender; no masses, no organomegaly, normal abdominal bowel sounds; no pannus; no intertriginous candida. There is no rebound and no distention. Rectal Exam: Not done EXTREMITIES: No bone or joint deformity; age-appropriate arthropathy of the hands and knees; no edema; no ulcerations.  There is no calf tenderness. Genitalia: not examined PULSES: 2+ and symmetric SKIN: Normal hydration no rash or ulceration CNS: Cranial nerves 2-12 grossly intact no focal lateralizing neurologic deficit.  Speech is fluent; uvula elevated with phonation, facial symmetry and tongue midline. DTR are normal bilaterally, cerebella exam is intact, barbinski is negative and strengths are equaled bilaterally.  No sensory loss.   Labs on Admission:  Basic Metabolic Panel:  Recent Labs Lab 09/08/14 2244 09/11/14 0930  09/12/14 0600  NA 138 129* 134*  K 3.9 3.3* 3.2*  CL 97* 87* 99*  CO2 27 26 25   GLUCOSE 239* 161* 179*  BUN 17 16 22*  CREATININE 1.23* 1.47* 1.59*  CALCIUM 9.9 10.2 8.8*   Liver Function Tests:  Recent Labs Lab 09/08/14 2244 09/11/14 0930 09/12/14 0600  AST 18 17 14*  ALT 15 14 12*  ALKPHOS 70 76 56  BILITOT 0.6 0.8 0.7  PROT 8.0 8.7* 6.3*  ALBUMIN 4.1 4.2 3.1*    Recent Labs Lab 09/08/14 2244 09/11/14 0930  LIPASE 21* 18*   No results for input(s): AMMONIA in the last 168 hours. CBC:  Recent Labs Lab 09/08/14 2244 09/11/14 0930 09/12/14 0600  WBC 11.0* 7.9 6.5  NEUTROABS 9.6* 5.5  --   HGB 13.2 15.4* 12.7  HCT 39.3 44.8 38.6  MCV 90.1 89.2 89.6  PLT 216 217 201   Cardiac Enzymes:  Recent Labs Lab 09/08/14 2244  TROPONINI <0.03    CBG:  Recent Labs Lab 09/08/14 2231 09/09/14 0457 09/11/14 1653 09/12/14 0317 09/12/14 0738  GLUCAP 220* 186* 135* 164* 147*     Radiological Exams on Admission: US Abdomen Limited Ruq  09/11/2014   CLINICAL DATA:  69 year old female with abdominal pain since 09/08/2014, currently with nausea and vomiting.  EXAM: US ABDOMEN LIMITED - RIGHT UPPER QUADRANT  COMPARISON:  CT of the abdomen and pelvis 09/09/2014.  FINDINGS: Gallbladder:  Multiple small echogenic foci with small amount of shadowing in the gallbladder, compatible with gallstones and/or biliary sludge balls. These are mobile in measure up to 1.1 cm. Gallbladder is only moderately distended, with no gallbladder wall thickening (wall thickness measures 1.6 mm), or pericholecystic fluid. Per the sonographer, there was no sonographic Murphy's sign on examination.  Common bile duct:  Diameter: 3.8 mm.  Liver:  No focal lesion identified. Within normal limits in parenchymal echogenicity.  IMPRESSION: 1. Study positive for cholelithiasis without evidence to suggest acute cholecystitis at this time.   Electronically Signed   By: Vinnie Langton M.D.   On: 09/11/2014  13:21   Assessment/Plan Present on Admission:  . Right kidney mass . Essential hypertension . Dehydration . Chronic back pain . Abdominal pain  PLAN:  I am not certain why she is having abdominal pain.  I don't think this is ischemic bowel.  She could have diabetic gastroparesis or a viral gastroenteritis.  Her abdominal CT and RUQ [place] was rather benign.  She no longer has diarrhea.  Will continue symptomatic Tx with IVF and IV pain meds.  Replete K.  I will consult GI for further recommendation.   Other plans as per orders.  Code Status: FULL Haskel Khan, MD. [facility] Pager [phone] 7pm to 7am.  09/12/2014, 10:14 AM

## 2014-09-12 NOTE — Consult Note (Addendum)
Referring Provider: No ref. provider found Primary Care Physician:  Purvis Kilts, MD Primary Gastroenterologist:  DR. Laural Golden  Reason for Consultation:  ABDOMINAL PAIN, nausea, vomiting   Impression: ADMITTED WITH ACUTE ONSET OF NAUSEA/VOMITING FOLLOWED BY ABDOMINAL PAIN LIKELY DUE TO ACUTE VIRAL ILLNESS, H PYLORI GASTRITIS, PUD, LESS LIKELY GASTROPARESIS, GASTRIC CA  OR CHRONIC CHOLCYSTITIS  Plan: 1. BID PROTONIX & MIRALAX 2. DILAUDID/ZOFRAN ATC AND PRN 3. D/C ENTERIC PRECAUTIONS  4. FULL LIQUID DIET THEN NPO AFTER MN EXCEPT SIPS WITH MEDS 5. PHENERGAN 12.5 MG IV IN PREOP/EGD WITH GASTRIC/DUODENAL bX JUN13 6. MIVFs    HPI:  Last seen by DR. REHMAN 2014: EGD FOR ABDOMINAL PAIN. SURGERY CONSULT FOR GALLSTONES. NO FOLLOW UP.  ABDOMINAL PAIN NOT REALLY A PROBLEM. STARTED WED EAYS AND THEN WENT QUICK. ABOVE AND BELOW [place] AND ACHY THERE ALL THE TIME TEHN GETS BETTER AND GETS WORSE. ~30 MINS AFTER CLEAR LIQUIDS PAIN GOT WORSE. NO BM SINCE TUES. HAD CHILLS AT HOME AND FEELS CHILLS NOW. NAUSEA/VFIRST THEN PAIN THEN VOMITING AT SOME POINT. TAKING TUMS AND PEPTO BISMOL FOR INDIGESTION & SAW BLACK STOOL. CHEST PAIN/SOB SINCE WED OFF AND ON: MODERATE.SOMETIMES THROAT FEELS TIGHT WITH LIQUIDS AND SOLIDS. HEARTBURN: ON MEDS AND THEY HELP. TAKES ASA 81 MG QHS. USING ALKA SELZTER WED x2.   PT DENIES FEVER, CHILLS, HEMATOCHEZIA, HEMATEMESIS, melena, diarrhea, CHANGE IN BOWEL IN HABITS, constipation, OR problems with sedation. NO ABX, STEROIDS. RARE ETOH.   Past Medical History  Diagnosis Date  . Hypertension   . Diabetes mellitus     non insulin  . Arthritis   . History of blood clots   . Homocysteinemia 11/29/2010  . History of gout     right ring finger  . Left knee pain   . Kidney atrophy     rt.    Past Surgical History  Procedure Laterality Date  . Abdominal hysterectomy    . Knee surgery      rt. arthroscopic  . Tonsillectomy    . Hypertension    . Type ll diabetes     . Neck surgery    . Esophagogastroduodenoscopy N/A 07/02/2012    Procedure: ESOPHAGOGASTRODUODENOSCOPY (EGD);  Surgeon: Rogene Houston, MD;  Location: [facility];  Service: Endoscopy;  Laterality: N/A;    Prior to Admission medications   Medication Sig Start Date End Date Taking? Authorizing Provider  acetaminophen (TYLENOL) 500 MG tablet Take 500 mg by mouth every 6 (six) hours as needed for pain. Takes 2 as needed for discomfort   Yes Historical Provider, MD  Ascorbic Acid (VITAMIN C) 1000 MG tablet Take 1,000 mg by mouth daily.   Yes Historical Provider, MD  aspirin 81 MG tablet Take 81 mg by mouth every evening.    Yes Historical Provider, MD  atenolol (TENORMIN) 25 MG tablet Take 1 tablet (25 mg total) by mouth daily. 08/13/14  Yes Mihai Croitoru, MD  b complex vitamins tablet Take 1 tablet by mouth daily. Takes 2 in the morning.   Yes Historical Provider, MD  Cholecalciferol (VITAMIN D3) 1000 UNITS CAPS Take 1 tablet by mouth daily.     Yes Historical Provider, MD  fish oil-omega-3 fatty acids 1000 MG capsule Take 1 g by mouth daily.    Yes Historical Provider, MD  Flaxseed, Linseed, 1000 MG CAPS Take 1 capsule by mouth daily.   Yes Historical Provider, MD  folic acid (FOLVITE) 1 MG tablet TAKE ONE (1) TABLET BY MOUTH EVERY DAY 09/07/13  Yes Manon Hilding Kefalas, PA-C  glimepiride (AMARYL) 2 MG tablet Take 2 mg by mouth 2 (two) times daily.   Yes Historical Provider, MD  lidocaine (LIDODERM) 5 % Place 1 patch onto the skin daily. Remove & Discard patch within 12 hours or as directed by MD   Yes Historical Provider, MD  linagliptin (TRADJENTA) 5 MG TABS tablet Take 5 mg by mouth daily.     Yes Historical Provider, MD  metFORMIN (GLUCOPHAGE) 500 MG tablet Take 500 mg by mouth 2 (two) times daily with a meal.   Yes Historical Provider, MD  NIFEdipine (PROCARDIA XL/ADALAT-CC) 60 MG 24 hr tablet Take 60 mg by mouth daily.     Yes Historical Provider, MD  oxyCODONE-acetaminophen  (PERCOCET/ROXICET) 5-325 MG per tablet Take 1 tablet by mouth daily as needed for moderate pain.  06/18/14  Yes Historical Provider, MD  pantoprazole (PROTONIX) 40 MG tablet Take 40 mg by mouth daily.   Yes Historical Provider, MD  pioglitazone (ACTOS) 30 MG tablet Take 30 mg by mouth daily.     Yes Historical Provider, MD  simvastatin (ZOCOR) 20 MG tablet Take 20 mg by mouth At bedtime.   Yes Historical Provider, MD  zolpidem (AMBIEN) 10 MG tablet Take 10 mg by mouth at bedtime as needed for sleep.   Yes Historical Provider, MD  ondansetron (ZOFRAN) 4 MG tablet Take 1 tablet (4 mg total) by mouth every 6 (six) hours. Patient not taking: Reported on 09/11/2014 09/09/14   Ezequiel Essex, MD    Current Facility-Administered Medications  Medication Dose Route Frequency Provider Last Rate Last Dose  . aspirin EC tablet 81 mg  81 mg Oral QPM Orvan Falconer, MD   81 mg at 09/11/14 1725  . atenolol (TENORMIN) tablet 25 mg  25 mg Oral Daily Orvan Falconer, MD   25 mg at 09/12/14 0820  . dextrose 5 % and 0.9 % NaCl with KCl 20 mEq/L infusion   Intravenous Continuous Orvan Falconer, MD 100 mL/hr at 09/12/14 0532    . folic acid (FOLVITE) tablet 1 mg  1 mg Oral Daily Orvan Falconer, MD   1 mg at 09/12/14 0820  . heparin injection 5,000 Units  5,000 Units Subcutaneous 3 times per day Orvan Falconer, MD   5,000 Units at 09/12/14 0532  . HYDROmorphone (DILAUDID) injection 0.5 mg  0.5 mg Intravenous Q3H PRN Orvan Falconer, MD   0.5 mg at 09/12/14 0939  . insulin aspart (novoLOG) injection 0-15 Units  0-15 Units Subcutaneous 6 times per day Orvan Falconer, MD   2 Units at 09/12/14 0820  . lidocaine (LIDODERM) 5 % 1 patch  1 patch Transdermal Q24H Orvan Falconer, MD      . metoCLOPramide [facility]) injection 10 mg  10 mg Intravenous QID PRN Orvan Falconer, MD   10 mg at 09/12/14 0855  . nicotine (NICODERM CQ - dosed in mg/24 hours) patch 14 mg  14 mg Transdermal Once Evalee Jefferson, PA-C   14 mg at 09/11/14 1312  . omega-3 acid ethyl esters (LOVAZA) capsule 1 g  1 g Oral  Daily Orvan Falconer, MD   1 g at 09/12/14 0820  . pantoprazole (PROTONIX) EC tablet 40 mg  40 mg Oral Daily Orvan Falconer, MD   40 mg at 09/12/14 0820  . zolpidem (AMBIEN) tablet 5 mg  5 mg Oral QHS PRN Orvan Falconer, MD   5 mg at 09/11/14 2238    Allergies as of 09/11/2014 - Review Complete 09/11/2014  Allergen  Reaction Noted  . Ace inhibitors Swelling   . Celecoxib Other (See Comments) 10/06/2010  . Hydrocodone-acetaminophen Itching 04/21/2009  . Zofran [ondansetron hcl]  09/11/2014    Family History  Problem Relation Age of Onset  . Stroke Mother   . Hypertension Mother   . Prostate cancer Father   . Diabetes Maternal Aunt   . Diabetes Maternal Uncle   . Diabetes Maternal Grandmother   . Diabetes Son   NO COLON POLYPS/CANCER   History   Social History  . Marital Status: Married    Spouse Name: N/A  . Number of Children: N/A  . Years of Education: N/A   Social History Main Topics  . Smoking status: Current Every Day Smoker -- 0.50 packs/day for 50 years    Types: Cigarettes  . Smokeless tobacco: Never Used  . Alcohol Use: Yes     Comment: occasionally  . Drug Use: No  . Sexual Activity: Yes    Birth Control/ Protection: Surgical   Other Topics Concern  . RETIRED TECHNICIAN AT [employer]   Social History Narrative    Review of Systems: PER HPI OTHERWISE ALL SYSTEMS ARE NEGATIVE. LAST TCS:  ~5 YRS AGO.  Vitals: Blood pressure 125/67, pulse 66, temperature 98.4 F (36.9 C), temperature source Oral, resp. rate 18, height 5\' 10"  (1.778 m), weight 215 lb (97.523 kg), SpO2 98 %.  Physical Exam: General:   Alert,  Well-developed, well-nourished, pleasant and cooperative in NAD Head:  Normocephalic and atraumatic. Eyes:  Sclera clear, no icterus.   Conjunctiva pink. Neck:  Supple; no masses. Lungs:  Clear throughout to auscultation.   No wheezes. No acute distress. Heart:  Regular rate and rhythm; no murmurs. Abdomen:  Soft, MILD TO MODERATE TENDERNESS TO PALPATION, nondistended. No  masses noted. Normal bowel sounds, without guarding, and without rebound.   Msk:  Symmetrical without gross deformities. Normal posture. Extremities:  Without edema. Neurologic:  Alert and  oriented x4;  grossly normal neurologically. Cervical Nodes:  No significant cervical adenopathy. Psych:  Alert and cooperative. Normal mood and flat affect.  Lab Results:  Recent Labs  09/11/14 0930 09/12/14 0600  WBC 7.9 6.5  HGB 15.4* 12.7  HCT 44.8 38.6  PLT 217 201   BMET  Recent Labs  09/11/14 0930 09/12/14 0600  NA 129* 134*  K 3.3* 3.2*  CL 87* 99*  CO2 26 25  GLUCOSE 161* 179*  BUN 16 22*  CREATININE 1.47* 1.59*  CALCIUM 10.2 8.8*   LFT  Recent Labs  09/12/14 0600  PROT 6.3*  ALBUMIN 3.1*  AST 14*  ALT 12*  ALKPHOS 56  BILITOT 0.7     Studies/Results: CT JUN 2016: R RENAL MASS     Miron Marxen  09/12/2014, 10:24 AM

## 2014-09-13 ENCOUNTER — Encounter (HOSPITAL_COMMUNITY): Payer: Self-pay

## 2014-09-13 ENCOUNTER — Telehealth: Payer: Self-pay | Admitting: Gastroenterology

## 2014-09-13 ENCOUNTER — Encounter (HOSPITAL_COMMUNITY)
Admission: EM | Disposition: A | Discharge status: Home / Self Care | Payer: Self-pay | Source: Home / Self Care | Attending: Internal Medicine

## 2014-09-13 DIAGNOSIS — R1013 Epigastric pain: Secondary | ICD-10-CM

## 2014-09-13 DIAGNOSIS — M1990 Unspecified osteoarthritis, unspecified site: Secondary | ICD-10-CM | POA: Diagnosis present

## 2014-09-13 DIAGNOSIS — E871 Hypo-osmolality and hyponatremia: Secondary | ICD-10-CM | POA: Diagnosis present

## 2014-09-13 DIAGNOSIS — M549 Dorsalgia, unspecified: Secondary | ICD-10-CM | POA: Diagnosis not present

## 2014-09-13 DIAGNOSIS — Z8249 Family history of ischemic heart disease and other diseases of the circulatory system: Secondary | ICD-10-CM | POA: Diagnosis not present

## 2014-09-13 DIAGNOSIS — G8929 Other chronic pain: Secondary | ICD-10-CM | POA: Diagnosis present

## 2014-09-13 DIAGNOSIS — F17210 Nicotine dependence, cigarettes, uncomplicated: Secondary | ICD-10-CM | POA: Diagnosis present

## 2014-09-13 DIAGNOSIS — E119 Type 2 diabetes mellitus without complications: Secondary | ICD-10-CM

## 2014-09-13 DIAGNOSIS — K2970 Gastritis, unspecified, without bleeding: Secondary | ICD-10-CM | POA: Diagnosis not present

## 2014-09-13 DIAGNOSIS — R112 Nausea with vomiting, unspecified: Secondary | ICD-10-CM | POA: Diagnosis not present

## 2014-09-13 DIAGNOSIS — Z823 Family history of stroke: Secondary | ICD-10-CM | POA: Diagnosis not present

## 2014-09-13 DIAGNOSIS — Z833 Family history of diabetes mellitus: Secondary | ICD-10-CM | POA: Diagnosis not present

## 2014-09-13 DIAGNOSIS — A084 Viral intestinal infection, unspecified: Secondary | ICD-10-CM | POA: Diagnosis present

## 2014-09-13 DIAGNOSIS — K2950 Unspecified chronic gastritis without bleeding: Secondary | ICD-10-CM | POA: Diagnosis not present

## 2014-09-13 DIAGNOSIS — Z7982 Long term (current) use of aspirin: Secondary | ICD-10-CM | POA: Diagnosis not present

## 2014-09-13 DIAGNOSIS — R1084 Generalized abdominal pain: Secondary | ICD-10-CM | POA: Diagnosis not present

## 2014-09-13 DIAGNOSIS — I10 Essential (primary) hypertension: Secondary | ICD-10-CM | POA: Diagnosis not present

## 2014-09-13 DIAGNOSIS — K2990 Gastroduodenitis, unspecified, without bleeding: Secondary | ICD-10-CM | POA: Diagnosis not present

## 2014-09-13 DIAGNOSIS — N2889 Other specified disorders of kidney and ureter: Secondary | ICD-10-CM | POA: Diagnosis present

## 2014-09-13 DIAGNOSIS — E860 Dehydration: Secondary | ICD-10-CM | POA: Diagnosis not present

## 2014-09-13 HISTORY — PX: ESOPHAGOGASTRODUODENOSCOPY: SHX5428

## 2014-09-13 LAB — GLUCOSE, CAPILLARY
GLUCOSE-CAPILLARY: 157 mg/dL — AB (ref 65–99)
GLUCOSE-CAPILLARY: 182 mg/dL — AB (ref 65–99)
GLUCOSE-CAPILLARY: 206 mg/dL — AB (ref 65–99)
Glucose-Capillary: 127 mg/dL — ABNORMAL HIGH (ref 65–99)
Glucose-Capillary: 144 mg/dL — ABNORMAL HIGH (ref 65–99)
Glucose-Capillary: 168 mg/dL — ABNORMAL HIGH (ref 65–99)
Glucose-Capillary: 169 mg/dL — ABNORMAL HIGH (ref 65–99)

## 2014-09-13 LAB — HEMOGLOBIN A1C
Hgb A1c MFr Bld: 6.7 % — ABNORMAL HIGH (ref 4.8–5.6)
Mean Plasma Glucose: 146 mg/dL

## 2014-09-13 SURGERY — EGD (ESOPHAGOGASTRODUODENOSCOPY)
Anesthesia: Moderate Sedation

## 2014-09-13 MED ORDER — LIDOCAINE VISCOUS 2 % MT SOLN
OROMUCOSAL | Status: DC | PRN
  Administered 2014-09-13: 3 mL via OROMUCOSAL

## 2014-09-13 MED ORDER — MIDAZOLAM HCL 5 MG/5ML IJ SOLN
INTRAMUSCULAR | Status: AC
  Filled 2014-09-13: qty 10

## 2014-09-13 MED ORDER — HYDROMORPHONE HCL 1 MG/ML IJ SOLN
0.5000 mg | Freq: Four times a day (QID) | INTRAMUSCULAR | Status: DC | PRN
  Administered 2014-09-14: 0.5 mg via INTRAVENOUS
  Filled 2014-09-13: qty 1

## 2014-09-13 MED ORDER — LIDOCAINE VISCOUS 2 % MT SOLN
OROMUCOSAL | Status: AC
  Filled 2014-09-13: qty 15

## 2014-09-13 MED ORDER — MEPERIDINE HCL 100 MG/ML IJ SOLN
INTRAMUSCULAR | Status: AC
  Filled 2014-09-13: qty 2

## 2014-09-13 MED ORDER — HEPARIN SODIUM (PORCINE) 5000 UNIT/ML IJ SOLN
5000.0000 [IU] | Freq: Three times a day (TID) | INTRAMUSCULAR | Status: DC
  Administered 2014-09-13 – 2014-09-14 (×4): 5000 [IU] via SUBCUTANEOUS
  Filled 2014-09-13 (×4): qty 1

## 2014-09-13 MED ORDER — PROMETHAZINE HCL 25 MG/ML IJ SOLN
INTRAMUSCULAR | Status: AC
  Administered 2014-09-13: 12.5 mg
  Filled 2014-09-13: qty 1

## 2014-09-13 MED ORDER — MIDAZOLAM HCL 5 MG/5ML IJ SOLN
INTRAMUSCULAR | Status: DC | PRN
  Administered 2014-09-13 (×2): 2 mg via INTRAVENOUS

## 2014-09-13 MED ORDER — MEPERIDINE HCL 100 MG/ML IJ SOLN
INTRAMUSCULAR | Status: DC | PRN
  Administered 2014-09-13: 50 mg via INTRAVENOUS
  Administered 2014-09-13: 25 mg via INTRAVENOUS

## 2014-09-13 MED ORDER — STERILE WATER FOR IRRIGATION IR SOLN
Status: DC | PRN
  Administered 2014-09-13: 12:00:00

## 2014-09-13 MED ORDER — SODIUM CHLORIDE 0.9 % IJ SOLN
INTRAMUSCULAR | Status: AC
  Filled 2014-09-13: qty 3

## 2014-09-13 NOTE — Telephone Encounter (Signed)
PT NEEDS OPV IN 4-6 WEEKS WITH DR. Laural Golden, E30 NAUSEA, VOMITING, ABD PAIN.

## 2014-09-13 NOTE — Interval H&P Note (Signed)
History and Physical Interval Note:  09/13/2014 10:29 AM  [name]  has presented today for surgery, with the diagnosis of VOMITING, EPIGASTRIC PAIN  The various methods of treatment have been discussed with the patient and family. After consideration of risks, benefits and other options for treatment, the patient has consented to  Procedure(s): ESOPHAGOGASTRODUODENOSCOPY (EGD) (N/A) as a surgical intervention .  The patient's history has been reviewed, patient examined, no change in status, stable for surgery.  I have reviewed the patient's chart and labs.  Questions were answered to the patient's satisfaction.     [employer]

## 2014-09-13 NOTE — Op Note (Signed)
[facility] [address] [place], [address]   ENDOSCOPY PROCEDURE REPORT  PATIENT: [name], [name]  MR#: [id number] BIRTHDATE: 03/19/1946 , 67  yrs. old GENDER: female  ENDOSCOPIST: Danie Binder, MD REFERRED TI:RWER Hilma Favors, M.D.  Hildred Laser, M.D.  PROCEDURE DATE: 09/19/2014 PROCEDURE:   EGD w/ biopsy  INDICATIONS:epigastric pain.   vomiting. MEDICATIONS: Promethazine (Phenergan) 12.5 mg IV, Demerol 75 mg IV, Demerol 50 mg IV, and Versed 4 mg IV TOPICAL ANESTHETIC:   Viscous Xylocaine ASA CLASS:  DESCRIPTION OF PROCEDURE:     Physical exam was performed.  Informed consent was obtained from the patient after explaining the benefits, risks, and alternatives to the procedure.  The patient was connected to the monitor and placed in the left lateral position.  Continuous oxygen was provided by nasal cannula and IV medicine administered through an indwelling cannula.  After administration of sedation, the patients esophagus was intubated and the [id number] ([id number])  endoscope was advanced under direct visualization to the second portion of the duodenum.  The scope was removed slowly by carefully examining the color, texture, anatomy, and integrity of the mucosa on the way out.  The patient was recovered in endoscopy and discharged home in satisfactory condition.  Estimated blood loss is zero unless otherwise noted in this procedure report.     ESOPHAGUS: The mucosa of the esophagus appeared normal.   STOMACH: Mild erosive gastritis (inflammation) was found on the lesser curvature of the gastric body and in the gastric antrum.  Multiple biopsies were performed using cold forceps.   DUODENUM: Moderate duodenal inflammation was found in the duodenal bulb.   The duodenal mucosa showed no abnormalities in the 2nd part of the duodenum.  Cold forceps biopsies were taken in the bulb and second portion. COMPLICATIONS: There were no immediate  complications.  ENDOSCOPIC IMPRESSION: 1.   NAUSE/VOMITINGEPIGASTRIC PAIN  RECOMMENDATIONS: BID PPI CARB MOD DYSPHAGIA 2 DIET. ANTICIPATE D/C WITHIN NEXT 24 HRS. AWAIT BIOPSY. CONSIDER GASTRIC EMPTYING STUDY IF NO H PYLORI GASTRITIS OR EOSINOPHILIC GASTROENTERITIS. OPV IN 4-6 WEEKS WITH DR.  Laural Golden.  REPEAT EXAM:  eSigned:  Danie Binder, MD 19-Sep-2014 12:18 PM  CPT CODES: ICD CODES:  The ICD and CPT codes recommended by this software are interpretations from the data that the clinical staff has captured with the software.  The verification of the translation of this report to the ICD and CPT codes and modifiers is the sole responsibility of the health care institution and practicing physician where this report was generated.  [employer] will not be held responsible for the validity of the ICD and CPT codes included on this report.  AMA assumes no liability for data contained or not contained herein. CPT is a Designer, television/film set of the [employer].

## 2014-09-13 NOTE — OR Nursing (Signed)
Phenergan 12.5 given preop per Dr. Oneida Alar order.

## 2014-09-13 NOTE — H&P (View-Only) (Signed)
Referring Provider: No ref. provider found Primary Care Physician:  Purvis Kilts, MD Primary Gastroenterologist:  DR. Laural Golden  Reason for Consultation:  ABDOMINAL PAIN, nausea, vomiting   Impression: ADMITTED WITH ACUTE ONSET OF NAUSEA/VOMITING FOLLOWED BY ABDOMINAL PAIN LIKELY DUE TO ACUTE VIRAL ILLNESS, H PYLORI GASTRITIS, PUD, LESS LIKELY GASTROPARESIS, GASTRIC CA  OR CHRONIC CHOLCYSTITIS  Plan: 1. BID PROTONIX & MIRALAX 2. DILAUDID/ZOFRAN ATC AND PRN 3. D/C ENTERIC PRECAUTIONS  4. FULL LIQUID DIET THEN NPO AFTER MN EXCEPT SIPS WITH MEDS 5. PHENERGAN 12.5 MG IV IN PREOP/EGD WITH GASTRIC/DUODENAL bX JUN13 6. MIVFs    HPI:  Last seen by DR. REHMAN 2014: EGD FOR ABDOMINAL PAIN. SURGERY CONSULT FOR GALLSTONES. NO FOLLOW UP.  ABDOMINAL PAIN NOT REALLY A PROBLEM. STARTED WED EAYS AND THEN WENT QUICK. ABOVE AND BELOW [place] AND ACHY THERE ALL THE TIME TEHN GETS BETTER AND GETS WORSE. ~30 MINS AFTER CLEAR LIQUIDS PAIN GOT WORSE. NO BM SINCE TUES. HAD CHILLS AT HOME AND FEELS CHILLS NOW. NAUSEA/VFIRST THEN PAIN THEN VOMITING AT SOME POINT. TAKING TUMS AND PEPTO BISMOL FOR INDIGESTION & SAW BLACK STOOL. CHEST PAIN/SOB SINCE WED OFF AND ON: MODERATE.SOMETIMES THROAT FEELS TIGHT WITH LIQUIDS AND SOLIDS. HEARTBURN: ON MEDS AND THEY HELP. TAKES ASA 81 MG QHS. USING ALKA SELZTER WED x2.   PT DENIES FEVER, CHILLS, HEMATOCHEZIA, HEMATEMESIS, melena, diarrhea, CHANGE IN BOWEL IN HABITS, constipation, OR problems with sedation. NO ABX, STEROIDS. RARE ETOH.   Past Medical History  Diagnosis Date  . Hypertension   . Diabetes mellitus     non insulin  . Arthritis   . History of blood clots   . Homocysteinemia 11/29/2010  . History of gout     right ring finger  . Left knee pain   . Kidney atrophy     rt.    Past Surgical History  Procedure Laterality Date  . Abdominal hysterectomy    . Knee surgery      rt. arthroscopic  . Tonsillectomy    . Hypertension    . Type ll diabetes     . Neck surgery    . Esophagogastroduodenoscopy N/A 07/02/2012    Procedure: ESOPHAGOGASTRODUODENOSCOPY (EGD);  Surgeon: Rogene Houston, MD;  [facility]: [facility];  Service: Endoscopy;  Laterality: N/A;    Prior to Admission medications   Medication Sig Start Date End Date Taking? Authorizing Provider  acetaminophen (TYLENOL) 500 MG tablet Take 500 mg by mouth every 6 (six) hours as needed for pain. Takes 2 as needed for discomfort   Yes Historical Provider, MD  Ascorbic Acid (VITAMIN C) 1000 MG tablet Take 1,000 mg by mouth daily.   Yes Historical Provider, MD  aspirin 81 MG tablet Take 81 mg by mouth every evening.    Yes Historical Provider, MD  atenolol (TENORMIN) 25 MG tablet Take 1 tablet (25 mg total) by mouth daily. 08/13/14  Yes Mihai Croitoru, MD  b complex vitamins tablet Take 1 tablet by mouth daily. Takes 2 in the morning.   Yes Historical Provider, MD  Cholecalciferol (VITAMIN D3) 1000 UNITS CAPS Take 1 tablet by mouth daily.     Yes Historical Provider, MD  fish oil-omega-3 fatty acids 1000 MG capsule Take 1 g by mouth daily.    Yes Historical Provider, MD  Flaxseed, Linseed, 1000 MG CAPS Take 1 capsule by mouth daily.   Yes Historical Provider, MD  folic acid (FOLVITE) 1 MG tablet TAKE ONE (1) TABLET BY MOUTH EVERY DAY 09/07/13  Yes Manon Hilding Kefalas, PA-C  glimepiride (AMARYL) 2 MG tablet Take 2 mg by mouth 2 (two) times daily.   Yes Historical Provider, MD  lidocaine (LIDODERM) 5 % Place 1 patch onto the skin daily. Remove & Discard patch within 12 hours or as directed by MD   Yes Historical Provider, MD  linagliptin (TRADJENTA) 5 MG TABS tablet Take 5 mg by mouth daily.     Yes Historical Provider, MD  metFORMIN (GLUCOPHAGE) 500 MG tablet Take 500 mg by mouth 2 (two) times daily with a meal.   Yes Historical Provider, MD  NIFEdipine (PROCARDIA XL/ADALAT-CC) 60 MG 24 hr tablet Take 60 mg by mouth daily.     Yes Historical Provider, MD  oxyCODONE-acetaminophen  (PERCOCET/ROXICET) 5-325 MG per tablet Take 1 tablet by mouth daily as needed for moderate pain.  06/18/14  Yes Historical Provider, MD  pantoprazole (PROTONIX) 40 MG tablet Take 40 mg by mouth daily.   Yes Historical Provider, MD  pioglitazone (ACTOS) 30 MG tablet Take 30 mg by mouth daily.     Yes Historical Provider, MD  simvastatin (ZOCOR) 20 MG tablet Take 20 mg by mouth At bedtime.   Yes Historical Provider, MD  zolpidem (AMBIEN) 10 MG tablet Take 10 mg by mouth at bedtime as needed for sleep.   Yes Historical Provider, MD  ondansetron (ZOFRAN) 4 MG tablet Take 1 tablet (4 mg total) by mouth every 6 (six) hours. Patient not taking: Reported on 09/11/2014 09/09/14   Ezequiel Essex, MD    Current Facility-Administered Medications  Medication Dose Route Frequency Provider Last Rate Last Dose  . aspirin EC tablet 81 mg  81 mg Oral QPM Orvan Falconer, MD   81 mg at 09/11/14 1725  . atenolol (TENORMIN) tablet 25 mg  25 mg Oral Daily Orvan Falconer, MD   25 mg at 09/12/14 0820  . dextrose 5 % and 0.9 % NaCl with KCl 20 mEq/L infusion   Intravenous Continuous Orvan Falconer, MD 100 mL/hr at 09/12/14 0532    . folic acid (FOLVITE) tablet 1 mg  1 mg Oral Daily Orvan Falconer, MD   1 mg at 09/12/14 0820  . heparin injection 5,000 Units  5,000 Units Subcutaneous 3 times per day Orvan Falconer, MD   5,000 Units at 09/12/14 0532  . HYDROmorphone (DILAUDID) injection 0.5 mg  0.5 mg Intravenous Q3H PRN Orvan Falconer, MD   0.5 mg at 09/12/14 0939  . insulin aspart (novoLOG) injection 0-15 Units  0-15 Units Subcutaneous 6 times per day Orvan Falconer, MD   2 Units at 09/12/14 0820  . lidocaine (LIDODERM) 5 % 1 patch  1 patch Transdermal Q24H Orvan Falconer, MD      . metoCLOPramide [facility]) injection 10 mg  10 mg Intravenous QID PRN Orvan Falconer, MD   10 mg at 09/12/14 0855  . nicotine (NICODERM CQ - dosed in mg/24 hours) patch 14 mg  14 mg Transdermal Once Evalee Jefferson, PA-C   14 mg at 09/11/14 1312  . omega-3 acid ethyl esters (LOVAZA) capsule 1 g  1 g Oral  Daily Orvan Falconer, MD   1 g at 09/12/14 0820  . pantoprazole (PROTONIX) EC tablet 40 mg  40 mg Oral Daily Orvan Falconer, MD   40 mg at 09/12/14 0820  . zolpidem (AMBIEN) tablet 5 mg  5 mg Oral QHS PRN Orvan Falconer, MD   5 mg at 09/11/14 2238    Allergies as of 09/11/2014 - Review Complete 09/11/2014  Allergen  Reaction Noted  . Ace inhibitors Swelling   . Celecoxib Other (See Comments) 10/06/2010  . Hydrocodone-acetaminophen Itching 04/21/2009  . Zofran [ondansetron hcl]  09/11/2014    Family History  Problem Relation Age of Onset  . Stroke Mother   . Hypertension Mother   . Prostate cancer Father   . Diabetes Maternal Aunt   . Diabetes Maternal Uncle   . Diabetes Maternal Grandmother   . Diabetes Son   NO COLON POLYPS/CANCER   History   Social History  . Marital Status: Married    Spouse Name: N/A  . Number of Children: N/A  . Years of Education: N/A   Social History Main Topics  . Smoking status: Current Every Day Smoker -- 0.50 packs/day for 50 years    Types: Cigarettes  . Smokeless tobacco: Never Used  . Alcohol Use: Yes     Comment: occasionally  . Drug Use: No  . Sexual Activity: Yes    Birth Control/ Protection: Surgical   Other Topics Concern  . RETIRED TECHNICIAN AT [employer]   Social History Narrative    Review of Systems: PER HPI OTHERWISE ALL SYSTEMS ARE NEGATIVE. LAST TCS:  ~5 YRS AGO.  Vitals: Blood pressure 125/67, pulse 66, temperature 98.4 F (36.9 C), temperature source Oral, resp. rate 18, height 5\' 10"  (1.778 m), weight 215 lb (97.523 kg), SpO2 98 %.  Physical Exam: General:   Alert,  Well-developed, well-nourished, pleasant and cooperative in NAD Head:  Normocephalic and atraumatic. Eyes:  Sclera clear, no icterus.   Conjunctiva pink. Neck:  Supple; no masses. Lungs:  Clear throughout to auscultation.   No wheezes. No acute distress. Heart:  Regular rate and rhythm; no murmurs. Abdomen:  Soft, MILD TO MODERATE TENDERNESS TO PALPATION, nondistended. No  masses noted. Normal bowel sounds, without guarding, and without rebound.   Msk:  Symmetrical without gross deformities. Normal posture. Extremities:  Without edema. Neurologic:  Alert and  oriented x4;  grossly normal neurologically. Cervical Nodes:  No significant cervical adenopathy. Psych:  Alert and cooperative. Normal mood and flat affect.  Lab Results:  Recent Labs  09/11/14 0930 09/12/14 0600  WBC 7.9 6.5  HGB 15.4* 12.7  HCT 44.8 38.6  PLT 217 201   BMET  Recent Labs  09/11/14 0930 09/12/14 0600  NA 129* 134*  K 3.3* 3.2*  CL 87* 99*  CO2 26 25  GLUCOSE 161* 179*  BUN 16 22*  CREATININE 1.47* 1.59*  CALCIUM 10.2 8.8*   LFT  Recent Labs  09/12/14 0600  PROT 6.3*  ALBUMIN 3.1*  AST 14*  ALT 12*  ALKPHOS 56  BILITOT 0.7     Studies/Results: CT JUN 2016: R RENAL MASS     Kathlean Cinco  09/12/2014, 10:24 AM

## 2014-09-13 NOTE — Progress Notes (Signed)
[facility] PROGRESS NOTE  [name] RXV:[id number] DOB: 1945-05-06    PCP:   Purvis Kilts, MD   HPI:  [name] is an 69 y.o. female admitted yesterday for abdominal pain, nausea, vomiting, and soft stool. Prior, she had diarrhea, but her stool is formed now. Her CT without contrast of the abd pelvic really didn't show anything acute. She has an enlarging renal mass raising the possibility of RCC. Her work up was negative, but she complained of increasing abdominal pain diffusely, and nausea. She does have DM, and elevated Homocysteine level, and have been on folate.GI was consulted yesterday, and Dr Oneida Alar saw her.  She was given IV PPI, and said she feels better today.  She is to have EGD today.   Rewiew of Systems:  Constitutional: Negative for malaise, fever and chills. No significant weight loss or weight gain Eyes: Negative for eye pain, redness and discharge, diplopia, visual changes, or flashes of light. ENMT: Negative for ear pain, hoarseness, nasal congestion, sinus pressure and sore throat. No headaches; tinnitus, drooling, or problem swallowing. Cardiovascular: Negative for chest pain, palpitations, diaphoresis, dyspnea and peripheral edema. ; No orthopnea, PND Respiratory: Negative for cough, hemoptysis, wheezing and stridor. No pleuritic chestpain. Gastrointestinal: Negative for diarrhea, constipation,  melena, blood in stool, hematemesis, jaundice and rectal bleeding.    Genitourinary: Negative for frequency, dysuria, incontinence,flank pain and hematuria; Musculoskeletal: Negative for back pain and neck pain. Negative for swelling and trauma.;  Skin: . Negative for pruritus, rash, abrasions, bruising and skin lesion.; ulcerations Neuro: Negative for headache, lightheadedness and neck stiffness. Negative for weakness, altered level of consciousness , altered mental status, extremity weakness, burning feet, involuntary movement, seizure and syncope.   Psych: negative for anxiety, depression, insomnia, tearfulness, panic attacks, hallucinations, paranoia, suicidal or homicidal ideation    Past Medical History  Diagnosis Date  . Hypertension   . Diabetes mellitus     non insulin  . Arthritis   . History of blood clots   . Homocysteinemia 11/29/2010  . History of gout     right ring finger  . Left knee pain   . Kidney atrophy     rt.    Past Surgical History  Procedure Laterality Date  . Abdominal hysterectomy    . Knee surgery      rt. arthroscopic  . Tonsillectomy    . Hypertension    . Type ll diabetes    . Neck surgery    . Esophagogastroduodenoscopy N/A 07/02/2012    Procedure: ESOPHAGOGASTRODUODENOSCOPY (EGD);  Surgeon: Rogene Houston, MD;  Location: [facility];  Service: Endoscopy;  Laterality: N/A;    Medications:  HOME MEDS: Prior to Admission medications   Medication Sig Start Date End Date Taking? Authorizing Provider  acetaminophen (TYLENOL) 500 MG tablet Take 500 mg by mouth every 6 (six) hours as needed for pain. Takes 2 as needed for discomfort   Yes Historical Provider, MD  Ascorbic Acid (VITAMIN C) 1000 MG tablet Take 1,000 mg by mouth daily.   Yes Historical Provider, MD  aspirin 81 MG tablet Take 81 mg by mouth every evening.    Yes Historical Provider, MD  atenolol (TENORMIN) 25 MG tablet Take 1 tablet (25 mg total) by mouth daily. 08/13/14  Yes Mihai Croitoru, MD  b complex vitamins tablet Take 1 tablet by mouth daily. Takes 2 in the morning.   Yes Historical Provider, MD  Cholecalciferol (VITAMIN D3) 1000 UNITS CAPS Take 1 tablet by  mouth daily.     Yes Historical Provider, MD  fish oil-omega-3 fatty acids 1000 MG capsule Take 1 g by mouth daily.    Yes Historical Provider, MD  Flaxseed, Linseed, 1000 MG CAPS Take 1 capsule by mouth daily.   Yes Historical Provider, MD  folic acid (FOLVITE) 1 MG tablet TAKE ONE (1) TABLET BY MOUTH EVERY DAY 09/07/13  Yes Manon Hilding Kefalas, PA-C  glimepiride (AMARYL) 2  MG tablet Take 2 mg by mouth 2 (two) times daily.   Yes Historical Provider, MD  lidocaine (LIDODERM) 5 % Place 1 patch onto the skin daily. Remove & Discard patch within 12 hours or as directed by MD   Yes Historical Provider, MD  linagliptin (TRADJENTA) 5 MG TABS tablet Take 5 mg by mouth daily.     Yes Historical Provider, MD  metFORMIN (GLUCOPHAGE) 500 MG tablet Take 500 mg by mouth 2 (two) times daily with a meal.   Yes Historical Provider, MD  NIFEdipine (PROCARDIA XL/ADALAT-CC) 60 MG 24 hr tablet Take 60 mg by mouth daily.     Yes Historical Provider, MD  oxyCODONE-acetaminophen (PERCOCET/ROXICET) 5-325 MG per tablet Take 1 tablet by mouth daily as needed for moderate pain.  06/18/14  Yes Historical Provider, MD  pantoprazole (PROTONIX) 40 MG tablet Take 40 mg by mouth daily.   Yes Historical Provider, MD  pioglitazone (ACTOS) 30 MG tablet Take 30 mg by mouth daily.     Yes Historical Provider, MD  simvastatin (ZOCOR) 20 MG tablet Take 20 mg by mouth At bedtime.   Yes Historical Provider, MD  zolpidem (AMBIEN) 10 MG tablet Take 10 mg by mouth at bedtime as needed for sleep.   Yes Historical Provider, MD  ondansetron (ZOFRAN) 4 MG tablet Take 1 tablet (4 mg total) by mouth every 6 (six) hours. Patient not taking: Reported on 09/11/2014 09/09/14   Ezequiel Essex, MD     Allergies:  Allergies  Allergen Reactions  . Ace Inhibitors Swelling  . Celecoxib Other (See Comments)    REACTION: Kidney issue  . Hydrocodone-Acetaminophen Itching    BRAND NAME: VICODIN  . Zofran [Ondansetron Hcl]     hiccups    Social History:   reports that she has been smoking Cigarettes.  She has a 25 pack-year smoking history. She has never used smokeless tobacco. She reports that she drinks alcohol. She reports that she does not use illicit drugs.  Family History: Family History  Problem Relation Age of Onset  . Stroke Mother   . Hypertension Mother   . Prostate cancer Father   . Diabetes Maternal Aunt    . Diabetes Maternal Uncle   . Diabetes Maternal Grandmother   . Diabetes Son      Physical Exam: Filed Vitals:   09/12/14 0540 09/12/14 1441 09/12/14 2217 09/13/14 0503  BP: 125/67 169/59 169/66 164/61  Pulse: 66 70 66 74  Temp: 98.4 F (36.9 C) 98.7 F (37.1 C) 99.7 F (37.6 C) 99.2 F (37.3 C)  TempSrc: Oral Oral Oral Oral  Resp: 18 20 16 14   Height:      Weight:      SpO2: 98% 98% 99% 97%   Blood pressure 164/61, pulse 74, temperature 99.2 F (37.3 C), temperature source Oral, resp. rate 14, height 5\' 10"  (1.778 m), weight 97.523 kg (215 lb), SpO2 97 %.  GEN:  Pleasant  patient lying in the stretcher in no acute distress; cooperative with exam. PSYCH:  alert and oriented x4; does  not appear anxious or depressed; affect is appropriate. HEENT: Mucous membranes pink and anicteric; PERRLA; EOM intact; no cervical lymphadenopathy nor thyromegaly or carotid bruit; no JVD; There were no stridor. Neck is very supple. Breasts:: Not examined CHEST WALL: No tenderness CHEST: Normal respiration, clear to auscultation bilaterally.  HEART: Regular rate and rhythm.  There are no murmur, rub, or gallops.   BACK: No kyphosis or scoliosis; no CVA tenderness ABDOMEN: soft and slightly tender with no rebounds, no masses, no organomegaly, normal abdominal bowel sounds; no pannus; no intertriginous candida. There is no rebound and no distention. Rectal Exam: Not done EXTREMITIES: No bone or joint deformity; age-appropriate arthropathy of the hands and knees; no edema; no ulcerations.  There is no calf tenderness. Genitalia: not examined PULSES: 2+ and symmetric SKIN: Normal hydration no rash or ulceration CNS: Cranial nerves 2-12 grossly intact no focal lateralizing neurologic deficit.  Speech is fluent; uvula elevated with phonation, facial symmetry and tongue midline. DTR are normal bilaterally, cerebella exam is intact, barbinski is negative and strengths are equaled bilaterally.  No sensory  loss.   Labs on Admission:  Basic Metabolic Panel:  Recent Labs Lab 09/08/14 2244 09/11/14 0930 09/12/14 0600  NA 138 129* 134*  K 3.9 3.3* 3.2*  CL 97* 87* 99*  CO2 27 26 25   GLUCOSE 239* 161* 179*  BUN 17 16 22*  CREATININE 1.23* 1.47* 1.59*  CALCIUM 9.9 10.2 8.8*   Liver Function Tests:  Recent Labs Lab 09/08/14 2244 09/11/14 0930 09/12/14 0600  AST 18 17 14*  ALT 15 14 12*  ALKPHOS 70 76 56  BILITOT 0.6 0.8 0.7  PROT 8.0 8.7* 6.3*  ALBUMIN 4.1 4.2 3.1*    Recent Labs Lab 09/08/14 2244 09/11/14 0930  LIPASE 21* 18*   CBC:  Recent Labs Lab 09/08/14 2244 09/11/14 0930 09/12/14 0600  WBC 11.0* 7.9 6.5  NEUTROABS 9.6* 5.5  --   HGB 13.2 15.4* 12.7  HCT 39.3 44.8 38.6  MCV 90.1 89.2 89.6  PLT 216 217 201   Cardiac Enzymes:  Recent Labs Lab 09/08/14 2244  TROPONINI <0.03    CBG:  Recent Labs Lab 09/12/14 1645 09/12/14 2004 09/13/14 0045 09/13/14 0501 09/13/14 0722  GLUCAP 193* 242* 168* 169* 157*     Radiological Exams on Admission: US Abdomen Limited Ruq  09/11/2014   CLINICAL DATA:  69 year old female with abdominal pain since 09/08/2014, currently with nausea and vomiting.  EXAM: US ABDOMEN LIMITED - RIGHT UPPER QUADRANT  COMPARISON:  CT of the abdomen and pelvis 09/09/2014.  FINDINGS: Gallbladder:  Multiple small echogenic foci with small amount of shadowing in the gallbladder, compatible with gallstones and/or biliary sludge balls. These are mobile in measure up to 1.1 cm. Gallbladder is only moderately distended, with no gallbladder wall thickening (wall thickness measures 1.6 mm), or pericholecystic fluid. Per the sonographer, there was no sonographic Murphy's sign on examination.  Common bile duct:  Diameter: 3.8 mm.  Liver:  No focal lesion identified. Within normal limits in parenchymal echogenicity.  IMPRESSION: 1. Study positive for cholelithiasis without evidence to suggest acute cholecystitis at this time.   Electronically Signed    By: Vinnie Langton M.D.   On: 09/11/2014 13:21   Assessment/Plan Present on Admission:  . Right kidney mass . Essential hypertension . Dehydration . Chronic back pain . Abdominal pain  PLAN: Abdominal pain. Could be gastroenteritis, gastritis, or PUD.  She will have EGD today.  Continue with current Tx.  Will continue  with her pain meds for her chronic low back pain.   Other plans as per orders.  Code Status: FULL Haskel Khan, MD. [facility] Pager [phone] 7pm to 7am.  09/13/2014, 9:46 AM

## 2014-09-14 ENCOUNTER — Encounter: Payer: Self-pay | Admitting: General Practice

## 2014-09-14 DIAGNOSIS — K2990 Gastroduodenitis, unspecified, without bleeding: Secondary | ICD-10-CM

## 2014-09-14 DIAGNOSIS — K2970 Gastritis, unspecified, without bleeding: Secondary | ICD-10-CM | POA: Insufficient documentation

## 2014-09-14 LAB — GLUCOSE, CAPILLARY
GLUCOSE-CAPILLARY: 162 mg/dL — AB (ref 65–99)
Glucose-Capillary: 110 mg/dL — ABNORMAL HIGH (ref 65–99)
Glucose-Capillary: 136 mg/dL — ABNORMAL HIGH (ref 65–99)
Glucose-Capillary: 169 mg/dL — ABNORMAL HIGH (ref 65–99)
Glucose-Capillary: 203 mg/dL — ABNORMAL HIGH (ref 65–99)

## 2014-09-14 MED ORDER — NICOTINE 14 MG/24HR TD PT24: 14.0000 mg | MEDICATED_PATCH | Freq: Every day | TRANSDERMAL | Status: DC

## 2014-09-14 MED ORDER — POLYETHYLENE GLYCOL 3350 17 G PO PACK: 17.0000 g | PACK | Freq: Two times a day (BID) | ORAL | Status: DC

## 2014-09-14 NOTE — Telephone Encounter (Signed)
PT WOULD LIKE TO CHANGE TO DR. Markel Mergenthaler. CANCEL APPT WITH DR. Laural Golden.

## 2014-09-14 NOTE — Progress Notes (Signed)
    Subjective: States she's not having a good morning. Abdominal pain is improved. Continued nausea. No vomiting.  Objective: Vital signs in last 24 hours: Temp:  [98.2 F (36.8 C)-99.4 F (37.4 C)] 99.4 F (37.4 C) (06/14 7510) Pulse Rate:  [60-74] 69 (06/14 0638) Resp:  [12-19] 18 (06/14 2585) BP: (112-192)/(57-82) 148/60 mmHg (06/14 0638) SpO2:  [97 %-100 %] 98 % (06/14 2778) Last BM Date: 09/07/14 General:   Alert and oriented, pleasant Head:  Normocephalic and atraumatic. Eyes:  No icterus, sclera clear. Conjuctiva pink.  Heart:  S1, S2 present, no murmurs noted.  Lungs: Clear to auscultation bilaterally, without wheezing, rales, or rhonchi.  Abdomen:  Bowel sounds present, soft, non-distended. Minimal TTP states "doesn't hurt as much as it just feels 'sick'", No HSM or hernias noted. No rebound or guarding. No masses appreciated  Neurologic:  Alert and  oriented x4;  grossly normal neurologically. Skin:  Warm and dry, intact without significant lesions.  Psych:  Alert and cooperative. Normal mood and affect.  Intake/Output from previous day: 06/13 0701 - 06/14 0700 In: 3116.7 [P.O.:120; I.V.:2996.7] Out: 1300 [Urine:1300] Intake/Output this shift:    Lab Results:  Recent Labs  09/12/14 0600  WBC 6.5  HGB 12.7  HCT 38.6  PLT 201   BMET  Recent Labs  09/12/14 0600  NA 134*  K 3.2*  CL 99*  CO2 25  GLUCOSE 179*  BUN 22*  CREATININE 1.59*  CALCIUM 8.8*   LFT  Recent Labs  09/12/14 0600  PROT 6.3*  ALBUMIN 3.1*  AST 14*  ALT 12*  ALKPHOS 56  BILITOT 0.7   PT/INR No results for input(s): LABPROT, INR in the last 72 hours. Hepatitis Panel No results for input(s): HEPBSAG, HCVAB, HEPAIGM, HEPBIGM in the last 72 hours.   Studies/Results: No results found.  Assessment: 69 year old female seen for acute onset N/V and abdominal pain worse after eating. Noted black stoolsl though had taken Pepto Bismol, admitted heartburn. Previously taking  daily 81 mg ASA and Alka seltzer prior to admission.   EGD done 09/13/14 and found normal esophageal mucosa, mild erosive gastritis, moderate duodenal inflammation in the duodenal bulb, normal 2nd part of the duodenum. Biopsies taken. Recommend bid PPI, carb mod dysphagia 2 diet, GES as outpatient if no H. Pylori or eosinophilic gastroenteritis. Outpatinet OV 4-6 weeks.  Today she admits continued nausea. Abdominal pain improved but still "feels sick." US Abdomen done 09/11/14 showed cholelithiasis without acute cholecystitis.  Plan: 1. Await pathology results from endoscopy 2. Outpatient OV in 4-6 weeks, possible GES 3. Continue bid PPI 4. We will sign off at this point. Please call with any questions.   Walden Field, AGNP-C Adult & Gerontological Nurse Practitioner [facility]     LOS: 1 day    09/14/2014, 10:02 AM

## 2014-09-14 NOTE — Telephone Encounter (Signed)
GI doctor changed to Dr Oneida Alar

## 2014-09-14 NOTE — Progress Notes (Signed)
Patient discharged home.  IV removed - WNL.  Follow up in place with GI.  Instructed on new meds and advised to stop smoking.  Handouts and education given on bland diet.  Verbalizes understanding.  Patient requested something for pain and nausea to go home with, MD made aware, no new orders given, though.  Patient stable at this time.  Assisted off unit via [place] with NT.

## 2014-09-14 NOTE — Telephone Encounter (Signed)
I GAVE APPT TO PATRICE AT [facility](300) AND I WILL ALSO MAIL A LETTER.  APPT: 10/2014 AT 8:30AM

## 2014-09-14 NOTE — Care Management Note (Signed)
Case Management Note  Patient Details  Name: [name] MRN: [id number] Date of Birth: 05-Mar-1946  Subjective/Objective:                  Pt admitted from home with nausea and vomiting. Pt lives with her husband and will return home at discharge. Pt is fairly independent with ADl's. Pt does have a cane for prn use.  Action/Plan: No Cm needs noted.  Expected Discharge Date:                  Expected Discharge Plan:  Home/Self Care  In-House Referral:  NA  Discharge planning Services  CM Consult  Post Acute Care Choice:  NA Choice offered to:  NA  DME Arranged:    DME Agency:     HH Arranged:    HH Agency:     Status of Service:  Completed, signed off  Medicare Important Message Given:    Date Medicare IM Given:    Medicare IM give by:    Date Additional Medicare IM Given:    Additional Medicare Important Message give by:     If discussed at [place] of Stay Meetings, dates discussed:    Additional Comments:  Joylene Draft, RN 09/14/2014, 11:27 AM

## 2014-09-14 NOTE — Plan of Care (Cosign Needed)
Problem: Food- and Nutrition-Related Knowledge Deficit (NB-1.1) Goal: Nutrition education Formal process to instruct or train a patient/client in a skill or to impart knowledge to help patients/clients voluntarily manage or modify food choices and eating behavior to maintain or improve health. Outcome: Adequate for Discharge Received consult for diet education for nausea and vomiting.  Provided pt with handout "Nausea and Vomiting Nutrition Therapy" by the [employer]. Walked pt through the handout emphasizing importance of staying hydrated, eating small meals, choosing foods that are bland and starchy; avoiding spicy and highly seasoned foods, as well as greasy foods. Encouraged pt to ask for crackers and other items from the nurses.  Pt states she is still nauseous and wasn't able to eat a regular meal during lunch. Reordered pt's dinner to better fit her current situation (ordered soup, pudding, apple sauce, banana and decaffinated fluids), provided suggestions for breakfast. Pt grateful for the visit, teach back method used, pt verbalized understanding, expect good compliance.   [name] A. Harriman Dietetic Intern Pager: [phone] 09/14/2014 3:37 PM

## 2014-09-14 NOTE — Discharge Summary (Signed)
Physician Discharge Summary  [name] HRC:[id number] DOB: [birth year] DOA: 09/11/2014  PCP: Shelley Kilts, MD  Admit date: 09/11/2014 Discharge date: 09/14/2014  Time spent: 35 minutes  Recommendations for Outpatient Follow-up:  1. Follow up with your PCP in a week. 2    Follow up with [name] as directed.  3    FOLLOW UP WITH [name] FOR YOUR RENAL MASS.   Discharge Diagnoses:  Active Problems:   Essential hypertension   Abdominal pain   Dehydration   Right kidney mass   DM type 2 (diabetes mellitus, type 2)   Chronic back pain   Nausea with vomiting   Gastritis and gastroduodenitis   Discharge Condition: improved.   Diet recommendation:  Bland food, carb modified diet, and cardiac healthy diet.   Filed Weights   09/11/14 0824 09/11/14 1700  Weight: 97.523 kg (215 lb) 97.523 kg (215 lb)    History of present illness: patient was admitted with persistent nausea, vomiting, and abdominal cramps.   As per my prior H and P;  " [name] is an 69 y.o. female with hx of HTN, DM, Gout, arthritis, returned to the ER again as she has persistent nausea, vomiting, and abdominal pain, unable to tolerate oral fluid and food. She was evaluated in the ER, felt to have a benign process, with negative abdominal CT (except the enlarging kidney mass suspicious for renal cell carcinoma needing follow up with [name]),, no leukocytosis, and was sent home. She denied RUQ pain, black or bloody stool, and her diarrhea has now soft stool. She was not started on any new medication, except for atenelol. She had no ill contact or distant travel. She denied being on antibiotics recently. Work up today showed normal lipase, normal LFTs, a little hyponatremia, and slight elevation of her Cr. She still has no leukocytosis, and hospitalist was asked to admit her for abdominal pain, diarrhea, nausea, and vomiting.   Hospital Course:   Patient was admitted into the hospital.  She was  given clear liquid and bowel rest , with IVF.  She was also given IV Reglan, in case she had diabetic gastroparesis.  She was thought to have had viral gastroenteritis, and she had no diarrhea, so no stool for C diff was collected.  She continued to have discomfort, so GI was consulted and thought that she likely has viral gastroenteritis as well, but could be gastritis and less likely is DU or malignancy.  EGD was performed and it was negative, showing mild erosive gastritis on the lesser curvature of the gastric body, and in the antrum, with moderate duodenitis on the D2 segment.  Her diet was advanced, she was kept in the hospital to assure she was able to eat.  She is now stable for discharge.  She expressed her desire to see [name] in follow up, and arrangement was made for that.  She will be on PPI BID.  SHE WAS STRESSED THAT SHE NEEDS TO FOLLOW UP WITH HER UROLOGIST NEXT WEEK AS HER RENAL MASS HAS ENLARGED AND NEEDS TO EXCLUDE RENAL CELL CARCINOMA. Thank you and good day.   Procedures:  EGD.   Consultations:  GI:  [name].   Discharge Exam: Filed Vitals:   09/14/14 1431  BP: 157/66  Pulse: 69  Temp: 98.9 F (37.2 C)  Resp: 18    General: well.  Cardiovascular: S1S2. Respiratory: clear.   Discharge Instructions   Discharge Instructions    Diet -  low sodium heart healthy    Complete by:  As directed      Increase activity slowly    Complete by:  As directed           Current Discharge Medication List    START taking these medications   Details  nicotine (NICODERM CQ - DOSED IN MG/24 HOURS) 14 mg/24hr patch Place 1 patch (14 mg total) onto the skin daily. Qty: 28 patch, Refills: 0    polyethylene glycol (MIRALAX / GLYCOLAX) packet Take 17 g by mouth 2 (two) times daily with a meal. Qty: 14 each, Refills: 0      CONTINUE these medications which have NOT CHANGED   Details  acetaminophen (TYLENOL) 500 MG tablet Take 500 mg by mouth every 6 (six) hours as needed  for pain. Takes 2 as needed for discomfort   Associated Diagnoses: Homocysteinemia; Deep vein thrombosis (DVT), right    Ascorbic Acid (VITAMIN C) 1000 MG tablet Take 1,000 mg by mouth daily.    aspirin 81 MG tablet Take 81 mg by mouth every evening.     atenolol (TENORMIN) 25 MG tablet Take 1 tablet (25 mg total) by mouth daily. Qty: 30 tablet, Refills: 6    b complex vitamins tablet Take 1 tablet by mouth daily. Takes 2 in the morning.    Cholecalciferol (VITAMIN D3) 1000 UNITS CAPS Take 1 tablet by mouth daily.      fish oil-omega-3 fatty acids 1000 MG capsule Take 1 g by mouth daily.    Associated Diagnoses: Homocysteinemia; Deep vein thrombosis (DVT), right    folic acid (FOLVITE) 1 MG tablet TAKE ONE (1) TABLET BY MOUTH EVERY DAY Qty: 60 tablet, Refills: 3    glimepiride (AMARYL) 2 MG tablet Take 2 mg by mouth 2 (two) times daily.    linagliptin (TRADJENTA) 5 MG TABS tablet Take 5 mg by mouth daily.      metFORMIN (GLUCOPHAGE) 500 MG tablet Take 500 mg by mouth 2 (two) times daily with a meal.    NIFEdipine (PROCARDIA XL/ADALAT-CC) 60 MG 24 hr tablet Take 60 mg by mouth daily.      pantoprazole (PROTONIX) 40 MG tablet Take 40 mg by mouth daily.    pioglitazone (ACTOS) 30 MG tablet Take 30 mg by mouth daily.      simvastatin (ZOCOR) 20 MG tablet Take 20 mg by mouth At bedtime.      STOP taking these medications     Flaxseed, Linseed, 1000 MG CAPS      lidocaine (LIDODERM) 5 %      oxyCODONE-acetaminophen (PERCOCET/ROXICET) 5-325 MG per tablet      zolpidem (AMBIEN) 10 MG tablet      ondansetron (ZOFRAN) 4 MG tablet        Allergies  Allergen Reactions  . Ace Inhibitors Swelling  . Celecoxib Other (See Comments)    REACTION: Kidney issue  . Hydrocodone-Acetaminophen Itching    BRAND NAME: VICODIN  . Zofran [Ondansetron Hcl]     hiccups   Follow-up Information    Follow up with Shelley Drain, MD On 10/20/2014.   Specialty:  Gastroenterology   Why:  at  8:30 am   Contact information:   [address] [place] [address] [phone]        The results of significant diagnostics from this hospitalization (including imaging, microbiology, ancillary and laboratory) are listed below for reference.    Significant Diagnostic Studies: Ct Abdomen Pelvis Wo Contrast  09/09/2014   CLINICAL DATA:  New abdominal pain, onset this morning. Nausea and vomiting.  EXAM: CT ABDOMEN AND PELVIS WITHOUT CONTRAST  TECHNIQUE: Multidetector CT imaging of the abdomen and pelvis was performed following the standard protocol without IV contrast.  COMPARISON:  06/29/2012, 11/17/2010  FINDINGS: There is continued enlargement of the right renal mass, now measuring 3.2 cm and previously measuring 2.5 cm on 06/29/2012 and 1.9 cm on 11/17/2010. This is suspicious for a renal cell carcinoma. The right kidney is markedly atrophic. There is chronic stranding around the right kidney.  The left kidney is unremarkable. There is no left hydronephrosis or hydroureter. There is an unchanged 2 cm left adrenal nodule. The right adrenal is normal.  There are unremarkable unenhanced appearances of the liver. There is cholelithiasis. The bile ducts appear unremarkable.  The pancreas and spleen appear unremarkable.  Bowel is remarkable only for mild uncomplicated colonic diverticulosis.  The abdominal aorta is normal in caliber. There is mild atherosclerotic calcification. There is no adenopathy in the abdomen or pelvis.  There is prior hysterectomy.  No adnexal abnormalities are evident.  No acute inflammatory changes are evident in the abdomen or pelvis. There is no ascites. There is no adenopathy.  No significant skeletal lesions are evident. There is facet arthritis at L4-5 and L5-S1  There is no significant abnormality in the lower chest.  IMPRESSION: 1. Slowly enlarging right renal mass, suspicious for renal cell carcinoma. 2. No acute findings are evident in the abdomen  or pelvis. 3. Benign left adrenal nodule. 4. Diverticulosis. 5. Severe lower lumbar facet arthritis.   Electronically Signed   By: Andreas Newport M.D.   On: 09/09/2014 04:29   US Abdomen Limited Ruq  09/11/2014   CLINICAL DATA:  69 year old female with abdominal pain since 09/08/2014, currently with nausea and vomiting.  EXAM: US ABDOMEN LIMITED - RIGHT UPPER QUADRANT  COMPARISON:  CT of the abdomen and pelvis 09/09/2014.  FINDINGS: Gallbladder:  Multiple small echogenic foci with small amount of shadowing in the gallbladder, compatible with gallstones and/or biliary sludge balls. These are mobile in measure up to 1.1 cm. Gallbladder is only moderately distended, with no gallbladder wall thickening (wall thickness measures 1.6 mm), or pericholecystic fluid. Per the sonographer, there was no sonographic Murphy's sign on examination.  Common bile duct:  Diameter: 3.8 mm.  Liver:  No focal lesion identified. Within normal limits in parenchymal echogenicity.  IMPRESSION: 1. Study positive for cholelithiasis without evidence to suggest acute cholecystitis at this time.   Electronically Signed   By: Vinnie Langton M.D.   On: 09/11/2014 13:21    Microbiology: No results found for this or any previous visit (from the past 240 hour(s)).   Labs: Basic Metabolic Panel:  Recent Labs Lab 09/08/14 2244 09/11/14 0930 09/12/14 0600  NA 138 129* 134*  K 3.9 3.3* 3.2*  CL 97* 87* 99*  CO2 27 26 25   GLUCOSE 239* 161* 179*  BUN 17 16 22*  CREATININE 1.23* 1.47* 1.59*  CALCIUM 9.9 10.2 8.8*   Liver Function Tests:  Recent Labs Lab 09/08/14 2244 09/11/14 0930 09/12/14 0600  AST 18 17 14*  ALT 15 14 12*  ALKPHOS 70 76 56  BILITOT 0.6 0.8 0.7  PROT 8.0 8.7* 6.3*  ALBUMIN 4.1 4.2 3.1*    Recent Labs Lab 09/08/14 2244 09/11/14 0930  LIPASE 21* 18*   No results for input(s): AMMONIA in the last 168 hours. CBC:  Recent Labs Lab 09/08/14 2244 09/11/14  0930 09/12/14 0600  WBC 11.0* 7.9  6.5  NEUTROABS 9.6* 5.5  --   HGB 13.2 15.4* 12.7  HCT 39.3 44.8 38.6  MCV 90.1 89.2 89.6  PLT 216 217 201   Cardiac Enzymes:  Recent Labs Lab 09/08/14 2244  TROPONINI <0.03   BNP: BNP (last 3 results) No results for input(s): BNP in the last 8760 hours.  ProBNP (last 3 results) No results for input(s): PROBNP in the last 8760 hours.  CBG:  Recent Labs Lab 09/14/14 0058 09/14/14 0412 09/14/14 0736 09/14/14 1155 09/14/14 1634  GLUCAP 136* 110* 162* 203* 169*   Signed:  Fenix Ruppe  [facility] 09/14/2014, 5:29 PM

## 2014-09-15 ENCOUNTER — Encounter (HOSPITAL_COMMUNITY): Payer: Self-pay | Admitting: Gastroenterology

## 2014-09-15 NOTE — Telephone Encounter (Signed)
Noted  

## 2014-09-18 LAB — STOOL CULTURE

## 2014-09-21 ENCOUNTER — Telehealth (INDEPENDENT_AMBULATORY_CARE_PROVIDER_SITE_OTHER): Payer: Self-pay | Admitting: *Deleted

## 2014-09-21 NOTE — Telephone Encounter (Signed)
Patient was called the week of 09/13/14. Was going to try patient back on 09/21/14 and noted patient already had an apt with Dr. Oneida Alar on 10/20/14.

## 2014-09-21 NOTE — Telephone Encounter (Signed)
-----   Message from Johnston Ebbs sent at 09/13/2014  3:32 PM EDT ----- Regarding: appt Per Dr. Oneida Alar     PT NEEDS OPV IN 4-6 WEEKS WITH DR. Laural Golden, E30 NAUSEA, VOMITING, ABD PAIN.

## 2014-09-30 ENCOUNTER — Telehealth: Payer: Self-pay | Admitting: Gastroenterology

## 2014-09-30 DIAGNOSIS — K2900 Acute gastritis without bleeding: Secondary | ICD-10-CM | POA: Diagnosis not present

## 2014-09-30 DIAGNOSIS — E782 Mixed hyperlipidemia: Secondary | ICD-10-CM | POA: Diagnosis not present

## 2014-09-30 DIAGNOSIS — M1991 Primary osteoarthritis, unspecified site: Secondary | ICD-10-CM | POA: Diagnosis not present

## 2014-09-30 DIAGNOSIS — I10 Essential (primary) hypertension: Secondary | ICD-10-CM | POA: Diagnosis not present

## 2014-09-30 DIAGNOSIS — I808 Phlebitis and thrombophlebitis of other sites: Secondary | ICD-10-CM | POA: Diagnosis not present

## 2014-09-30 DIAGNOSIS — E6609 Other obesity due to excess calories: Secondary | ICD-10-CM | POA: Diagnosis not present

## 2014-09-30 DIAGNOSIS — Z1389 Encounter for screening for other disorder: Secondary | ICD-10-CM | POA: Diagnosis not present

## 2014-09-30 DIAGNOSIS — Z6833 Body mass index (BMI) 33.0-33.9, adult: Secondary | ICD-10-CM | POA: Diagnosis not present

## 2014-09-30 DIAGNOSIS — K8020 Calculus of gallbladder without cholecystitis without obstruction: Secondary | ICD-10-CM | POA: Diagnosis not present

## 2014-09-30 DIAGNOSIS — N2889 Other specified disorders of kidney and ureter: Secondary | ICD-10-CM | POA: Diagnosis not present

## 2014-09-30 DIAGNOSIS — E119 Type 2 diabetes mellitus without complications: Secondary | ICD-10-CM | POA: Diagnosis not present

## 2014-09-30 NOTE — Telephone Encounter (Signed)
Please call pt. HER stomach Bx shows gastritis.  HER SMALL BOWEL BIOPSIES ARE NORMAL.  CONTINUE PROTONIX. TAKE 30 MINUTES PRIOR TO FIRST MEAL DAILY. SHE NEEDS A  GASTRIC EMPYTING STUDY TO EVALUATE FOR  DELAYED GASTRIC EMPTYING AS A CAUSE FOR NAUSEA AND VOMITING.  FOLLOW A LOW FAT DIET. FOLLOW UP IN JUL OR AUG 2016.

## 2014-09-30 NOTE — Progress Notes (Signed)
Patient ID: [name], female   DOB: [birth year], 69 y.o.   MRN: [id number]     Cardiology Office Note   Date:  10/01/2014   ID:  [name], DOB 1945/05/04, MRN [id number]  PCP:  Purvis Kilts, MD  Cardiologist:   Sanda Klein, MD   Chief Complaint  Patient presents with  . Follow-up    stress test result. was admitted to [facility] for 4 days for gerd?      History of Present Illness: [name] is a 68 y.o. female who presents for follow up after a stress nuclear perfusion study performed for chest pain evaluation. The study was normal. Beta blockers were added at her last appointment, but she developed mild symptomatic hypotension and has stopped them.  She was seen in the [facility] ED June 14 for GI complaints (gastroduodenitis on EGD) and is scheduled to have a gastric emptying study in the next few days. Incidental note was made of an enlarging right kidney mass, probably renal cell carcinoma. The right kidney was again described as atrophic, but no comment was made regarding renal artery stenosis.  Her past medical history is significant for type 2 diabetes mellitus and hypertension, both of which have been ongoing for at least 20 years. She has a history of a hypotrophic right kidney associated with ultrasound evidence of renal artery obstruction. She has normal renal function. She has treated hyperlipidemia on simvastatin with LDL of 81 and compensated diabetes with a hemoglobin A1c at 6.5%. She does not have a known history of coronary artery disease, peripheral artery disease (other than the renal artery obstruction), stroke, TIA or congestive heart failure or arrhythmia. She had a deep venous thrombosis of the lower extremity following trauma many years ago. She smokes roughly half a pack of cigarettes a day and has smoked for over 40 years. She has try to quit 5 or 6 times unsuccessfully.   Past Medical History  Diagnosis Date  . Hypertension   . Diabetes mellitus       non insulin  . Arthritis   . History of blood clots   . Homocysteinemia 11/29/2010  . History of gout     right ring finger  . Left knee pain   . Kidney atrophy     rt.    Past Surgical History  Procedure Laterality Date  . Abdominal hysterectomy    . Knee surgery      rt. arthroscopic  . Tonsillectomy    . Hypertension    . Type ll diabetes    . Neck surgery    . Esophagogastroduodenoscopy N/A 07/02/2012    Procedure: ESOPHAGOGASTRODUODENOSCOPY (EGD);  Surgeon: Rogene Houston, MD;  Location: [facility];  Service: Endoscopy;  Laterality: N/A;  . Esophagogastroduodenoscopy N/A 09/13/2014    Procedure: ESOPHAGOGASTRODUODENOSCOPY (EGD);  Surgeon: Danie Binder, MD;  Location: [facility];  Service: Endoscopy;  Laterality: N/A;     Current Outpatient Prescriptions  Medication Sig Dispense Refill  . Ascorbic Acid (VITAMIN C) 1000 MG tablet Take 1,000 mg by mouth daily.    Marland Kitchen aspirin 81 MG tablet Take 81 mg by mouth every evening.     Marland Kitchen atenolol (TENORMIN) 25 MG tablet Take 1 tablet (25 mg total) by mouth daily. 30 tablet 6  . b complex vitamins tablet Take 1 tablet by mouth daily. Takes 2 in the morning.    . Cholecalciferol (VITAMIN D3) 1000 UNITS CAPS Take 1 tablet by mouth daily.      Marland Kitchen  fish oil-omega-3 fatty acids 1000 MG capsule Take 1 g by mouth daily.     . folic acid (FOLVITE) 1 MG tablet TAKE ONE (1) TABLET BY MOUTH EVERY DAY 60 tablet 3  . glimepiride (AMARYL) 2 MG tablet Take 2 mg by mouth 2 (two) times daily.    Marland Kitchen linagliptin (TRADJENTA) 5 MG TABS tablet Take 5 mg by mouth daily.      . metFORMIN (GLUCOPHAGE) 500 MG tablet Take 500 mg by mouth 2 (two) times daily with a meal.    . nicotine (NICODERM CQ - DOSED IN MG/24 HOURS) 14 mg/24hr patch Place 1 patch (14 mg total) onto the skin daily. 28 patch 0  . pantoprazole (PROTONIX) 40 MG tablet Take 40 mg by mouth daily.    . pioglitazone (ACTOS) 30 MG tablet Take 30 mg by mouth daily.      . polyethylene glycol  (MIRALAX / GLYCOLAX) packet Take 17 g by mouth 2 (two) times daily with a meal. 14 each 0  . simvastatin (ZOCOR) 20 MG tablet Take 20 mg by mouth At bedtime.     No current facility-administered medications for this visit.    Allergies:   Ace inhibitors; Celecoxib; Hydrocodone-acetaminophen; and Zofran    Social History:  The patient  reports that she has been smoking Cigarettes.  She has a 25 pack-year smoking history. She has never used smokeless tobacco. She reports that she drinks alcohol. She reports that she does not use illicit drugs.   Family History:  The patient's family history includes Diabetes in her maternal aunt, maternal grandmother, maternal uncle, and son; Hypertension in her mother; Prostate cancer in her father; Stroke in her mother.    ROS:  Please see the history of present illness.    Otherwise, review of systems positive for none.   All other systems are reviewed and negative.    PHYSICAL EXAM: VS:  BP 131/76 mmHg  Pulse 95  Ht 5\' 10"  (1.778 m)  Wt 213 lb 1.6 oz (96.662 kg)  BMI 30.58 kg/m2 , BMI Body mass index is 30.58 kg/(m^2).  General: Alert, oriented x3, no distress Head: no evidence of trauma, PERRL, EOMI, no exophtalmos or lid lag, no myxedema, no xanthelasma; normal ears, nose and oropharynx Neck: normal jugular venous pulsations and no hepatojugular reflux; brisk carotid pulses without delay and no carotid bruits Chest: clear to auscultation, no signs of consolidation by percussion or palpation, normal fremitus, symmetrical and full respiratory excursions Cardiovascular: normal position and quality of the apical impulse, regular rhythm, normal first and second heart sounds, no  murmurs, rubs or gallops Abdomen: no tenderness or distention, no masses by palpation, no abnormal pulsatility or arterial bruits, normal bowel sounds, no hepatosplenomegaly Extremities: no clubbing, cyanosis or edema; 2+ radial, ulnar and brachial pulses bilaterally; 2+ right  femoral, posterior tibial and dorsalis pedis pulses; 2+ left femoral, posterior tibial and dorsalis pedis pulses; no subclavian or femoral bruits Neurological: grossly nonfocal Psych: euthymic mood, full affect   EKG:  EKG is not ordered today.   Recent Labs: 09/11/2014: TSH 1.251 09/12/2014: ALT 12*; BUN 22*; Creatinine, Ser 1.59*; Hemoglobin 12.7; Platelets 201; Potassium 3.2*; Sodium 134*    Lipid Panel No results found for: CHOL, TRIG, HDL, CHOLHDL, VLDL, LDLCALC, LDLDIRECT    Wt Readings from Last 3 Encounters:  10/01/14 213 lb 1.6 oz (96.662 kg)  09/11/14 215 lb (97.523 kg)  09/08/14 215 lb (97.523 kg)      Other studies Reviewed: Additional studies/  records that were reviewed today include:  Labs, radiology studies, ECG, notes from recent hospitalization.   ASSESSMENT AND PLAN:  1. Exertional chest pain - normal perfusion study. Focus on risk factor modification.  Would like to stop nifedipine and treat with atenolol instead , especially since this might provide additional protection during surgery if she needs nephrectomy.  2. Right renal artery stenosis/kidney atrophy -BP well controlled. Renal mass/probable carcinoma compounds issue. If she requires nephrectomy, the issue of renal artery stenosis will become a moot point.  I encouraged her to make an appointment with her urologist as soon as possible.  3. Hyperlipidemia - if vascular disease identified, the target LDL should be less than 70. Otherwise her lipid profile is actually pretty good and at target  4. Diabetes mellitus type 2, well controlled  5. Hypertension, probably essential, well controlled  On just one agent. She does not tolerate both nifedipine and atenolol. We'll try to use atenolol preferentially since she has relatively fast baseline heart rate and for the potential additional cardioprotective effect with possible upcoming surgery.  She will call me if she does not tolerate the beta blocker  well.  6. Mild obesity  7. Tobacco abuse - we spent a long time discussing the adverse effects of smoking and she is strongly encouraged to quit.   8. cervical radiculopathy. I think her neck and arm symptoms are not related to her chest pain and are most likely representative of cervical spine disease for which has previously undergone surgery.    Current medicines are reviewed at length with the patient today.  The patient has concerns regarding medicines,  We reviewed those today.  The following changes have been made:   Stop nifedipine, take atenolol 25 mg daily  Labs/ tests ordered today include:  No orders of the defined types were placed in this encounter.    Patient Instructions  Your physician wants you to follow-up in: 1 Year You will receive a reminder letter in the mail two months in advance. If you don't receive a letter, please call our office to schedule the follow-up appointment.  Your physician has recommended you make the following change in your medication: STOP Nifedipine        Signed, Sanda Klein, MD  10/01/2014 12:56 PM    Sanda Klein, MD, [facility] [phone] office [phone] pager

## 2014-10-01 ENCOUNTER — Encounter: Payer: Self-pay | Admitting: Cardiovascular Disease

## 2014-10-01 ENCOUNTER — Ambulatory Visit (INDEPENDENT_AMBULATORY_CARE_PROVIDER_SITE_OTHER): Payer: Medicare Other | Admitting: Cardiovascular Disease

## 2014-10-01 ENCOUNTER — Other Ambulatory Visit: Payer: Self-pay

## 2014-10-01 VITALS — BP 131/76 | HR 95 | Ht 70.0 in | Wt 213.1 lb

## 2014-10-01 DIAGNOSIS — N2889 Other specified disorders of kidney and ureter: Secondary | ICD-10-CM | POA: Diagnosis not present

## 2014-10-01 DIAGNOSIS — I10 Essential (primary) hypertension: Secondary | ICD-10-CM | POA: Diagnosis not present

## 2014-10-01 DIAGNOSIS — R112 Nausea with vomiting, unspecified: Secondary | ICD-10-CM

## 2014-10-01 DIAGNOSIS — Z720 Tobacco use: Secondary | ICD-10-CM

## 2014-10-01 NOTE — Patient Instructions (Addendum)
Your physician wants you to follow-up in: 1 Year You will receive a reminder letter in the mail two months in advance. If you don't receive a letter, please call our office to schedule the follow-up appointment.  Your physician has recommended you make the following change in your medication: STOP Nifedipine

## 2014-10-01 NOTE — Telephone Encounter (Signed)
She is set up for the GES on 10/12/14 @ 8:00. She is aware.

## 2014-10-01 NOTE — Telephone Encounter (Signed)
Pt is aware of results. She has a follow up appointment with [facility] on 10/20/14. She would like to see about having the GES done on 10/12/14.

## 2014-10-05 DIAGNOSIS — E1342 Other specified diabetes mellitus with diabetic polyneuropathy: Secondary | ICD-10-CM | POA: Diagnosis not present

## 2014-10-05 DIAGNOSIS — B351 Tinea unguium: Secondary | ICD-10-CM | POA: Diagnosis not present

## 2014-10-05 DIAGNOSIS — L851 Acquired keratosis [keratoderma] palmaris et plantaris: Secondary | ICD-10-CM | POA: Diagnosis not present

## 2014-10-07 ENCOUNTER — Telehealth: Payer: Self-pay | Admitting: Cardiovascular Disease

## 2014-10-07 DIAGNOSIS — E119 Type 2 diabetes mellitus without complications: Secondary | ICD-10-CM | POA: Diagnosis not present

## 2014-10-07 DIAGNOSIS — E6609 Other obesity due to excess calories: Secondary | ICD-10-CM | POA: Diagnosis not present

## 2014-10-07 DIAGNOSIS — Z1389 Encounter for screening for other disorder: Secondary | ICD-10-CM | POA: Diagnosis not present

## 2014-10-07 DIAGNOSIS — Z6833 Body mass index (BMI) 33.0-33.9, adult: Secondary | ICD-10-CM | POA: Diagnosis not present

## 2014-10-07 DIAGNOSIS — E782 Mixed hyperlipidemia: Secondary | ICD-10-CM | POA: Diagnosis not present

## 2014-10-07 DIAGNOSIS — E1129 Type 2 diabetes mellitus with other diabetic kidney complication: Secondary | ICD-10-CM | POA: Diagnosis not present

## 2014-10-07 DIAGNOSIS — I10 Essential (primary) hypertension: Secondary | ICD-10-CM | POA: Diagnosis not present

## 2014-10-07 NOTE — Telephone Encounter (Signed)
Left message with spouse for patient instructing her to call me back.

## 2014-10-07 NOTE — Telephone Encounter (Signed)
Patient seen on 7/1, instructed to d/c Nifedipine, continue atenolol.  States she is not doing well on atenolol, was instructed to call if not tolerant to it.  Will route for Dr. Loletha Grayer to advise.

## 2014-10-07 NOTE — Telephone Encounter (Signed)
Pt started taking Atenolol,she was supposed to let Dr C know how this was going. She can not tolerate this,she feeks tired and sometimes gets slight headaches. She would like to start back taking Nifedical.

## 2014-10-07 NOTE — Telephone Encounter (Signed)
DC atenolol, go back to Nifedical please.

## 2014-10-07 NOTE — Telephone Encounter (Signed)
Pt had called back, I spoke to her and explained recommendations from Dr. Sallyanne Kuster - she voiced understanding. Will resume the Nifedical and stop atenolol. Encouraged to call if any further concerns.

## 2014-10-12 ENCOUNTER — Encounter (HOSPITAL_COMMUNITY): Payer: Self-pay

## 2014-10-12 ENCOUNTER — Ambulatory Visit (HOSPITAL_COMMUNITY)
Admission: RE | Admit: 2014-10-12 | Discharge: 2014-10-12 | Disposition: A | Discharge status: Home / Self Care | Payer: Medicare Other | Source: Ambulatory Visit | Attending: Gastroenterology | Admitting: Gastroenterology

## 2014-10-12 DIAGNOSIS — R112 Nausea with vomiting, unspecified: Secondary | ICD-10-CM | POA: Insufficient documentation

## 2014-10-12 MED ORDER — TECHNETIUM TC 99M SULFUR COLLOID
2.0000 | Freq: Once | INTRAVENOUS | Status: AC | PRN
  Administered 2014-10-12: 2 via INTRAVENOUS

## 2014-10-15 DIAGNOSIS — M1711 Unilateral primary osteoarthritis, right knee: Secondary | ICD-10-CM | POA: Diagnosis not present

## 2014-10-15 DIAGNOSIS — M25562 Pain in left knee: Secondary | ICD-10-CM | POA: Diagnosis not present

## 2014-10-20 ENCOUNTER — Ambulatory Visit (INDEPENDENT_AMBULATORY_CARE_PROVIDER_SITE_OTHER): Payer: Medicare Other | Admitting: Gastroenterology

## 2014-10-20 ENCOUNTER — Encounter: Payer: Self-pay | Admitting: Gastroenterology

## 2014-10-20 VITALS — BP 134/81 | HR 95 | Temp 98.6°F | Ht 70.0 in | Wt 209.8 lb

## 2014-10-20 DIAGNOSIS — K2990 Gastroduodenitis, unspecified, without bleeding: Secondary | ICD-10-CM | POA: Diagnosis not present

## 2014-10-20 DIAGNOSIS — K2970 Gastritis, unspecified, without bleeding: Secondary | ICD-10-CM

## 2014-10-20 NOTE — Progress Notes (Signed)
cc'ed to pcp °

## 2014-10-20 NOTE — Progress Notes (Signed)
ON RECALL LIST  °

## 2014-10-20 NOTE — Progress Notes (Signed)
Subjective:    Patient ID: [name], female    DOB: 05-16-1945, 69 y.o.   MRN: [id number] Purvis Kilts, MD   HPI When she came home lived on oatmeal and potato soup for about 2 weeks. AVOIDING PIZZA, CABBAGE. EATING GREEN BEANS, BUIT NO COLLARDS OR TURNIP GREENS. INTENTIONAL WEIGHT LOSS DUE TO CHANGING DIET AND WOULD LIKE TO LOSE ANOTHER 25 LBS. HAS PROBLEMS WITH HER BACK AND CAN'T WALK FOR A LONG PERIOD OF TIME. NOT TAKING BB DUE TO FATIGUE.  PT DENIES FEVER, CHILLS, HEMATOCHEZIA, HEMATEMESIS, nausea, vomiting, melena, diarrhea, CHEST PAIN, SHORTNESS OF BREATH, constipation, abdominal pain, problems swallowing, OR  heartburn or indigestion.  Past Medical History  Diagnosis Date  . Hypertension   . Diabetes mellitus     non insulin  . Arthritis   . History of blood clots   . Homocysteinemia 11/29/2010  . History of gout     right ring finger  . Left knee pain   . Kidney atrophy     rt.   Past Surgical History  Procedure Laterality Date  . Abdominal hysterectomy    . Knee surgery      rt. arthroscopic  . Tonsillectomy    . Hypertension    . Type ll diabetes    . Neck surgery    . Esophagogastroduodenoscopy N/A 07/02/2012    Procedure: ESOPHAGOGASTRODUODENOSCOPY (EGD);  Surgeon: Rogene Houston, MD;  [facility];  Service: Endoscopy;  Laterality: N/A;  . Esophagogastroduodenoscopy N/A 09/13/2014    Procedure: ESOPHAGOGASTRODUODENOSCOPY (EGD);  Surgeon: Danie Binder, MD;  Location: [facility];  Service: Endoscopy;  Laterality: N/A;   Allergies  Allergen Reactions  . Ace Inhibitors Swelling  . Celecoxib Other (See Comments)    REACTION: Kidney issue  . Hydrocodone-Acetaminophen Itching    BRAND NAME: VICODIN  . Zofran [Ondansetron Hcl]     hiccups   Current Outpatient Prescriptions  Medication Sig Dispense Refill  . Ascorbic Acid (VITAMIN C) 1000 MG tablet Take 1,000 mg by mouth daily.    Marland Kitchen aspirin 81 MG tablet Take 81 mg by mouth every  evening.     .      . b complex vitamins tablet Take 1 tablet by mouth daily. Takes 2 in the morning.    . Cholecalciferol (VITAMIN D3) 1000 UNITS CAPS Take 1 tablet by mouth daily.      . fish oil-omega-3 fatty acids 1000 MG capsule Take 1 g by mouth daily.     . Flaxseed, Linseed, (EQL FLAX SEED OIL) 1000 MG CAPS Take by mouth daily.    . folic acid (FOLVITE) 1 MG tablet TAKE ONE (1) TABLET BY MOUTH EVERY DAY    . glimepiride (AMARYL) 2 MG tablet Take 2 mg by mouth 2 (two) times daily.    Marland Kitchen linagliptin (TRADJENTA) 5 MG TABS tablet Take 5 mg by mouth daily.      . metFORMIN (GLUCOPHAGE) 500 MG tablet Take 500 mg by mouth 2 (two) times daily with a meal.    . Misc Natural Products (JOINT HEALTH) CAPS Take by mouth 2 (two) times daily.    Marland Kitchen NIFEDICAL XL 60 MG 24 hr tablet     . pantoprazole (PROTONIX) 40 MG tablet Take 40 mg by mouth daily.    . pioglitazone (ACTOS) 30 MG tablet Take 30 mg by mouth daily.      . simvastatin (ZOCOR) 20 MG tablet Take 20 mg by mouth At bedtime.    Marland Kitchen      Marland Kitchen  polyethylene glycol (MIRALAX / GLYCOLAX) packet Take 17 g daily with a meal. ONCE A DAY    Review of Systems PER HPI OTHERWISE ALL SYSTEMS ARE NEGATIVE.    Objective:   Physical Exam        Assessment & Plan:

## 2014-10-20 NOTE — Assessment & Plan Note (Signed)
SYMPTOMS CONTROLLED/RESOLVED.  LOW FAT/DIABETIC DIET CONTINUE PROTONIX AVOID TRIGGERS FOR GASTRITIS AND GERD. COMPLETE EVAL FOR R KIDNEY MASS. REVIEWED LABS/CT/U/S JUN 2016 & RESULTS OF GES FOLLOW UP IN 6 MOS.

## 2014-10-20 NOTE — Patient Instructions (Addendum)
CONTINUE YOUR WEIGHT LOSS EFFORTS.  FOLLOW A LOW FAT/DIABETIC DIET. MEATS SHOULD BE BAKED, BROILED, OR BOILED. AVOID FRIED FOODS.  CONTINUE PROTONIX DAILY TO PROTECT YOUR STOMACH AND PREVENT GASTRITIS OR HEARTBURN.  AVOID TRIGGERS FOR REFLUX AND GASTRITIS WHICH CAN CAUSE NAUSEA AND VOMITING. SEE INFO BELOW.  FOLLOW UP IN 6 MOS.  Gastritis  Gastritis is an inflammation (the body's way of reacting to injury and/or infection) of the stomach. It is often caused by viral or bacterial (germ) infections. It can also be caused BY ASPIRIN, BC/GOODY POWDER'S, (IBUPROFEN) MOTRIN, OR ALEVE (NAPROXEN), chemicals (including alcohol), SPICY FOODS, and medications. This illness may be associated with generalized malaise (feeling tired, not well), UPPER ABDOMINAL STOMACH cramps, and fever. One common bacterial cause of gastritis is an organism known as H. Pylori. This can be treated with antibiotics.    Lifestyle and home remedies You may eliminate or reduce the frequency of heartburn by making the following lifestyle changes:  . Control your weight. Being overweight is a major risk factor for heartburn and GERD. Excess pounds put pressure on your abdomen, pushing up your stomach and causing acid to back up into your esophagus.   . Eat smaller meals. 4 TO 6 MEALS A DAY. This reduces pressure on the lower esophageal sphincter, helping to prevent the valve from opening and acid from washing back into your esophagus.   Dolphus Jenny your belt. Clothes that fit tightly around your waist put pressure on your abdomen and the lower esophageal sphincter.   . Eliminate heartburn triggers. Everyone has specific triggers.  .   Common triggers such as fatty or fried foods, spicy food, tomato sauce, carbonated beverages, alcohol, chocolate, mint, garlic, onion, caffeine and nicotine may make heartburn worse.   Marland Kitchen Avoid stooping or bending. Tying your shoes is OK. Bending over for longer periods to weed your garden isn't,  especially soon after eating.   . Don't lie down after a meal. Wait at least three to four hours after eating before going to bed, and don't lie down right after eating.   Alternative medicine . Several home remedies exist for treating GERD, but they provide only temporary relief. They include drinking baking soda (sodium bicarbonate) added to water or drinking other fluids such as baking soda mixed with cream of tartar and water. . Although these liquids create temporary relief by neutralizing, washing away or buffering acids, eventually they aggravate the situation by adding gas and fluid to your stomach, increasing pressure and causing more acid reflux. Further, adding more sodium to your diet may increase your blood pressure and add stress to your heart, and excessive bicarbonate ingestion can alter the acid-base balance in your body.

## 2014-11-01 DIAGNOSIS — N2889 Other specified disorders of kidney and ureter: Secondary | ICD-10-CM | POA: Diagnosis not present

## 2014-11-01 DIAGNOSIS — N261 Atrophy of kidney (terminal): Secondary | ICD-10-CM | POA: Diagnosis not present

## 2014-11-01 DIAGNOSIS — Z720 Tobacco use: Secondary | ICD-10-CM | POA: Diagnosis not present

## 2014-11-03 DIAGNOSIS — N261 Atrophy of kidney (terminal): Secondary | ICD-10-CM | POA: Diagnosis not present

## 2014-11-03 DIAGNOSIS — N2889 Other specified disorders of kidney and ureter: Secondary | ICD-10-CM | POA: Diagnosis not present

## 2014-11-03 DIAGNOSIS — I700 Atherosclerosis of aorta: Secondary | ICD-10-CM | POA: Diagnosis not present

## 2014-11-03 DIAGNOSIS — N281 Cyst of kidney, acquired: Secondary | ICD-10-CM | POA: Diagnosis not present

## 2014-11-11 DIAGNOSIS — N261 Atrophy of kidney (terminal): Secondary | ICD-10-CM | POA: Diagnosis not present

## 2014-11-11 DIAGNOSIS — D495 Neoplasm of unspecified behavior of other genitourinary organs: Secondary | ICD-10-CM | POA: Diagnosis not present

## 2014-11-11 DIAGNOSIS — N2889 Other specified disorders of kidney and ureter: Secondary | ICD-10-CM | POA: Diagnosis not present

## 2014-11-22 ENCOUNTER — Other Ambulatory Visit (HOSPITAL_COMMUNITY): Payer: Self-pay | Admitting: Urology

## 2014-11-22 DIAGNOSIS — M1711 Unilateral primary osteoarthritis, right knee: Secondary | ICD-10-CM | POA: Diagnosis not present

## 2014-11-22 DIAGNOSIS — M25561 Pain in right knee: Secondary | ICD-10-CM | POA: Diagnosis not present

## 2014-11-22 DIAGNOSIS — N2889 Other specified disorders of kidney and ureter: Secondary | ICD-10-CM

## 2014-11-26 ENCOUNTER — Ambulatory Visit (HOSPITAL_COMMUNITY)
Admission: RE | Admit: 2014-11-26 | Discharge: 2014-11-26 | Disposition: A | Discharge status: Home / Self Care | Payer: Medicare Other | Source: Ambulatory Visit | Attending: Urology | Admitting: Urology

## 2014-11-26 DIAGNOSIS — D3502 Benign neoplasm of left adrenal gland: Secondary | ICD-10-CM | POA: Diagnosis not present

## 2014-11-26 DIAGNOSIS — N261 Atrophy of kidney (terminal): Secondary | ICD-10-CM | POA: Insufficient documentation

## 2014-11-26 DIAGNOSIS — N2889 Other specified disorders of kidney and ureter: Secondary | ICD-10-CM | POA: Diagnosis not present

## 2014-11-26 DIAGNOSIS — E119 Type 2 diabetes mellitus without complications: Secondary | ICD-10-CM | POA: Diagnosis not present

## 2014-11-26 DIAGNOSIS — K8020 Calculus of gallbladder without cholecystitis without obstruction: Secondary | ICD-10-CM | POA: Insufficient documentation

## 2014-11-26 DIAGNOSIS — I10 Essential (primary) hypertension: Secondary | ICD-10-CM | POA: Diagnosis not present

## 2014-11-26 DIAGNOSIS — N281 Cyst of kidney, acquired: Secondary | ICD-10-CM | POA: Diagnosis not present

## 2014-11-26 MED ORDER — GADOBENATE DIMEGLUMINE 529 MG/ML IV SOLN
20.0000 mL | Freq: Once | INTRAVENOUS | Status: AC | PRN
  Administered 2014-11-26: 19 mL via INTRAVENOUS

## 2014-11-29 DIAGNOSIS — M1711 Unilateral primary osteoarthritis, right knee: Secondary | ICD-10-CM | POA: Diagnosis not present

## 2014-11-29 DIAGNOSIS — M25561 Pain in right knee: Secondary | ICD-10-CM | POA: Diagnosis not present

## 2014-12-08 DIAGNOSIS — M1711 Unilateral primary osteoarthritis, right knee: Secondary | ICD-10-CM | POA: Diagnosis not present

## 2014-12-14 DIAGNOSIS — E1142 Type 2 diabetes mellitus with diabetic polyneuropathy: Secondary | ICD-10-CM | POA: Diagnosis not present

## 2014-12-14 DIAGNOSIS — L851 Acquired keratosis [keratoderma] palmaris et plantaris: Secondary | ICD-10-CM | POA: Diagnosis not present

## 2014-12-14 DIAGNOSIS — B351 Tinea unguium: Secondary | ICD-10-CM | POA: Diagnosis not present

## 2014-12-22 DIAGNOSIS — N3946 Mixed incontinence: Secondary | ICD-10-CM | POA: Diagnosis not present

## 2014-12-22 DIAGNOSIS — N261 Atrophy of kidney (terminal): Secondary | ICD-10-CM | POA: Diagnosis not present

## 2014-12-22 DIAGNOSIS — N2889 Other specified disorders of kidney and ureter: Secondary | ICD-10-CM | POA: Diagnosis not present

## 2015-01-12 DIAGNOSIS — Z23 Encounter for immunization: Secondary | ICD-10-CM | POA: Diagnosis not present

## 2015-01-12 DIAGNOSIS — Z6832 Body mass index (BMI) 32.0-32.9, adult: Secondary | ICD-10-CM | POA: Diagnosis not present

## 2015-01-12 DIAGNOSIS — E6609 Other obesity due to excess calories: Secondary | ICD-10-CM | POA: Diagnosis not present

## 2015-01-12 DIAGNOSIS — I10 Essential (primary) hypertension: Secondary | ICD-10-CM | POA: Diagnosis not present

## 2015-01-12 DIAGNOSIS — E1129 Type 2 diabetes mellitus with other diabetic kidney complication: Secondary | ICD-10-CM | POA: Diagnosis not present

## 2015-01-12 DIAGNOSIS — E782 Mixed hyperlipidemia: Secondary | ICD-10-CM | POA: Diagnosis not present

## 2015-01-12 DIAGNOSIS — Z1389 Encounter for screening for other disorder: Secondary | ICD-10-CM | POA: Diagnosis not present

## 2015-01-28 DIAGNOSIS — M1711 Unilateral primary osteoarthritis, right knee: Secondary | ICD-10-CM | POA: Diagnosis not present

## 2015-03-01 ENCOUNTER — Other Ambulatory Visit (HOSPITAL_COMMUNITY): Payer: Self-pay | Admitting: Podiatry

## 2015-03-01 DIAGNOSIS — E1142 Type 2 diabetes mellitus with diabetic polyneuropathy: Secondary | ICD-10-CM | POA: Diagnosis not present

## 2015-03-01 DIAGNOSIS — L851 Acquired keratosis [keratoderma] palmaris et plantaris: Secondary | ICD-10-CM | POA: Diagnosis not present

## 2015-03-01 DIAGNOSIS — B351 Tinea unguium: Secondary | ICD-10-CM | POA: Diagnosis not present

## 2015-03-01 DIAGNOSIS — E1042 Type 1 diabetes mellitus with diabetic polyneuropathy: Secondary | ICD-10-CM

## 2015-03-04 ENCOUNTER — Ambulatory Visit (HOSPITAL_COMMUNITY)
Admission: RE | Admit: 2015-03-04 | Discharge: 2015-03-04 | Disposition: A | Discharge status: Home / Self Care | Payer: Medicare Other | Source: Ambulatory Visit | Attending: Podiatry | Admitting: Podiatry

## 2015-03-04 DIAGNOSIS — R252 Cramp and spasm: Secondary | ICD-10-CM | POA: Diagnosis not present

## 2015-03-04 DIAGNOSIS — E1042 Type 1 diabetes mellitus with diabetic polyneuropathy: Secondary | ICD-10-CM | POA: Insufficient documentation

## 2015-03-04 DIAGNOSIS — E1142 Type 2 diabetes mellitus with diabetic polyneuropathy: Secondary | ICD-10-CM | POA: Diagnosis not present

## 2015-03-11 DIAGNOSIS — E1142 Type 2 diabetes mellitus with diabetic polyneuropathy: Secondary | ICD-10-CM | POA: Diagnosis not present

## 2015-03-16 ENCOUNTER — Encounter: Payer: Self-pay | Admitting: Gastroenterology

## 2015-05-06 ENCOUNTER — Other Ambulatory Visit (HOSPITAL_COMMUNITY): Payer: Self-pay | Admitting: Family Medicine

## 2015-05-06 DIAGNOSIS — Z1231 Encounter for screening mammogram for malignant neoplasm of breast: Secondary | ICD-10-CM

## 2015-05-09 ENCOUNTER — Ambulatory Visit (HOSPITAL_COMMUNITY)
Admission: RE | Admit: 2015-05-09 | Discharge: 2015-05-09 | Disposition: A | Discharge status: Home / Self Care | Payer: Medicare Other | Source: Ambulatory Visit | Attending: Family Medicine | Admitting: Family Medicine

## 2015-05-09 DIAGNOSIS — Z1231 Encounter for screening mammogram for malignant neoplasm of breast: Secondary | ICD-10-CM

## 2015-05-10 DIAGNOSIS — L851 Acquired keratosis [keratoderma] palmaris et plantaris: Secondary | ICD-10-CM | POA: Diagnosis not present

## 2015-05-10 DIAGNOSIS — E1142 Type 2 diabetes mellitus with diabetic polyneuropathy: Secondary | ICD-10-CM | POA: Diagnosis not present

## 2015-05-10 DIAGNOSIS — B351 Tinea unguium: Secondary | ICD-10-CM | POA: Diagnosis not present

## 2015-05-12 DIAGNOSIS — Z719 Counseling, unspecified: Secondary | ICD-10-CM | POA: Diagnosis not present

## 2015-05-12 DIAGNOSIS — J069 Acute upper respiratory infection, unspecified: Secondary | ICD-10-CM | POA: Diagnosis not present

## 2015-05-12 DIAGNOSIS — Z1389 Encounter for screening for other disorder: Secondary | ICD-10-CM | POA: Diagnosis not present

## 2015-05-12 DIAGNOSIS — Z6833 Body mass index (BMI) 33.0-33.9, adult: Secondary | ICD-10-CM | POA: Diagnosis not present

## 2015-05-12 DIAGNOSIS — E6609 Other obesity due to excess calories: Secondary | ICD-10-CM | POA: Diagnosis not present

## 2015-05-20 ENCOUNTER — Other Ambulatory Visit (HOSPITAL_COMMUNITY): Payer: Self-pay | Admitting: Family Medicine

## 2015-05-20 ENCOUNTER — Ambulatory Visit (HOSPITAL_COMMUNITY)
Admission: RE | Admit: 2015-05-20 | Discharge: 2015-05-20 | Disposition: A | Discharge status: Home / Self Care | Payer: Medicare Other | Source: Ambulatory Visit | Attending: Family Medicine | Admitting: Family Medicine

## 2015-05-20 DIAGNOSIS — E1129 Type 2 diabetes mellitus with other diabetic kidney complication: Secondary | ICD-10-CM | POA: Diagnosis not present

## 2015-05-20 DIAGNOSIS — Z1389 Encounter for screening for other disorder: Secondary | ICD-10-CM | POA: Diagnosis not present

## 2015-05-20 DIAGNOSIS — J209 Acute bronchitis, unspecified: Secondary | ICD-10-CM | POA: Insufficient documentation

## 2015-05-20 DIAGNOSIS — Z6833 Body mass index (BMI) 33.0-33.9, adult: Secondary | ICD-10-CM | POA: Diagnosis not present

## 2015-05-20 DIAGNOSIS — F17200 Nicotine dependence, unspecified, uncomplicated: Secondary | ICD-10-CM | POA: Diagnosis not present

## 2015-05-20 DIAGNOSIS — I10 Essential (primary) hypertension: Secondary | ICD-10-CM | POA: Diagnosis not present

## 2015-05-20 DIAGNOSIS — J40 Bronchitis, not specified as acute or chronic: Secondary | ICD-10-CM

## 2015-05-20 DIAGNOSIS — E119 Type 2 diabetes mellitus without complications: Secondary | ICD-10-CM | POA: Diagnosis not present

## 2015-05-20 DIAGNOSIS — E782 Mixed hyperlipidemia: Secondary | ICD-10-CM | POA: Diagnosis not present

## 2015-05-20 DIAGNOSIS — R0602 Shortness of breath: Secondary | ICD-10-CM | POA: Diagnosis not present

## 2015-05-20 DIAGNOSIS — R05 Cough: Secondary | ICD-10-CM | POA: Diagnosis not present

## 2015-06-20 DIAGNOSIS — H35033 Hypertensive retinopathy, bilateral: Secondary | ICD-10-CM | POA: Diagnosis not present

## 2015-06-20 DIAGNOSIS — E119 Type 2 diabetes mellitus without complications: Secondary | ICD-10-CM | POA: Diagnosis not present

## 2015-07-13 DIAGNOSIS — Z0000 Encounter for general adult medical examination without abnormal findings: Secondary | ICD-10-CM | POA: Diagnosis not present

## 2015-07-13 DIAGNOSIS — E6609 Other obesity due to excess calories: Secondary | ICD-10-CM | POA: Diagnosis not present

## 2015-07-13 DIAGNOSIS — Z6833 Body mass index (BMI) 33.0-33.9, adult: Secondary | ICD-10-CM | POA: Diagnosis not present

## 2015-07-13 DIAGNOSIS — Z1389 Encounter for screening for other disorder: Secondary | ICD-10-CM | POA: Diagnosis not present

## 2015-07-13 DIAGNOSIS — B354 Tinea corporis: Secondary | ICD-10-CM | POA: Diagnosis not present

## 2015-08-02 DIAGNOSIS — E1142 Type 2 diabetes mellitus with diabetic polyneuropathy: Secondary | ICD-10-CM | POA: Diagnosis not present

## 2015-08-02 DIAGNOSIS — B351 Tinea unguium: Secondary | ICD-10-CM | POA: Diagnosis not present

## 2015-08-02 DIAGNOSIS — L851 Acquired keratosis [keratoderma] palmaris et plantaris: Secondary | ICD-10-CM | POA: Diagnosis not present

## 2015-08-12 DIAGNOSIS — M1711 Unilateral primary osteoarthritis, right knee: Secondary | ICD-10-CM | POA: Diagnosis not present

## 2015-08-19 DIAGNOSIS — M1711 Unilateral primary osteoarthritis, right knee: Secondary | ICD-10-CM | POA: Diagnosis not present

## 2015-08-26 DIAGNOSIS — M1711 Unilateral primary osteoarthritis, right knee: Secondary | ICD-10-CM | POA: Diagnosis not present

## 2015-08-30 DIAGNOSIS — Z1389 Encounter for screening for other disorder: Secondary | ICD-10-CM | POA: Diagnosis not present

## 2015-08-30 DIAGNOSIS — E119 Type 2 diabetes mellitus without complications: Secondary | ICD-10-CM | POA: Diagnosis not present

## 2015-08-30 DIAGNOSIS — E6609 Other obesity due to excess calories: Secondary | ICD-10-CM | POA: Diagnosis not present

## 2015-08-30 DIAGNOSIS — E782 Mixed hyperlipidemia: Secondary | ICD-10-CM | POA: Diagnosis not present

## 2015-08-30 DIAGNOSIS — I10 Essential (primary) hypertension: Secondary | ICD-10-CM | POA: Diagnosis not present

## 2015-08-30 DIAGNOSIS — Z6833 Body mass index (BMI) 33.0-33.9, adult: Secondary | ICD-10-CM | POA: Diagnosis not present

## 2015-10-14 DIAGNOSIS — L851 Acquired keratosis [keratoderma] palmaris et plantaris: Secondary | ICD-10-CM | POA: Diagnosis not present

## 2015-10-14 DIAGNOSIS — E1142 Type 2 diabetes mellitus with diabetic polyneuropathy: Secondary | ICD-10-CM | POA: Diagnosis not present

## 2015-10-14 DIAGNOSIS — B351 Tinea unguium: Secondary | ICD-10-CM | POA: Diagnosis not present

## 2015-11-07 ENCOUNTER — Encounter: Payer: Self-pay | Admitting: Cardiovascular Disease

## 2015-11-07 ENCOUNTER — Encounter (INDEPENDENT_AMBULATORY_CARE_PROVIDER_SITE_OTHER): Payer: Self-pay

## 2015-11-07 ENCOUNTER — Ambulatory Visit (INDEPENDENT_AMBULATORY_CARE_PROVIDER_SITE_OTHER): Payer: Medicare Other | Admitting: Cardiovascular Disease

## 2015-11-07 VITALS — BP 140/68 | HR 80 | Ht 70.0 in | Wt 215.4 lb

## 2015-11-07 DIAGNOSIS — E669 Obesity, unspecified: Secondary | ICD-10-CM

## 2015-11-07 DIAGNOSIS — E119 Type 2 diabetes mellitus without complications: Secondary | ICD-10-CM

## 2015-11-07 DIAGNOSIS — I10 Essential (primary) hypertension: Secondary | ICD-10-CM | POA: Diagnosis not present

## 2015-11-07 DIAGNOSIS — R0989 Other specified symptoms and signs involving the circulatory and respiratory systems: Secondary | ICD-10-CM | POA: Diagnosis not present

## 2015-11-07 DIAGNOSIS — E785 Hyperlipidemia, unspecified: Secondary | ICD-10-CM

## 2015-11-07 DIAGNOSIS — Z720 Tobacco use: Secondary | ICD-10-CM | POA: Diagnosis not present

## 2015-11-07 DIAGNOSIS — N2889 Other specified disorders of kidney and ureter: Secondary | ICD-10-CM

## 2015-11-07 NOTE — Patient Instructions (Signed)
Medication Instructions: Dr Sallyanne Kuster recommends that you continue on your current medications as directed. Please refer to the Current Medication list given to you today.  Labwork: NONE ORDERED  Testing/Procedures: 1. Carotid Duplex - Your physician has requested that you have a carotid duplex. This test is an ultrasound of the carotid arteries in your neck. It looks at blood flow through these arteries that supply the brain with blood. Allow one hour for this exam. There are no restrictions or special instructions.  Follow-up: Dr C recommends that you schedule a follow-up appointment in 1 year. You will receive a reminder letter in the mail two months in advance. If you don't receive a letter, please call our office to schedule the follow-up appointment.  If you need a refill on your cardiac medications before your next appointment, please call your pharmacy.

## 2015-11-07 NOTE — Progress Notes (Signed)
Cardiology Office Note    Date:  11/08/2015   ID:  [name], DOB [birth year], MRN [id number]  PCP:  Purvis Kilts, MD  Cardiologist:   Sanda Klein, MD   Chief Complaint  Patient presents with  . yearly visit    pt c/o pain in neck and running down left arm--was happening more often a few weeks ago but has slowed down some; mild dizziness--randomly when standing; occasional pain/cramping in feet and legs at night; some SOB on exertion.    History of Present Illness:  [name] is a 70 y.o. female smoker with type 2 diabetes mellitus and hypertension and atrophic right kidney, presents for routine follow-up. Roughly one year ago a nuclear stress test showed normal pattern of cardio perfusion. She denies current complaints of chest discomfort, shortness of breath, palpitations, dizziness or syncope, leg edema, intermittent claudication or focal neurological complaints.  She reports interest in smoking cessation but the only progress she has made is cutting down from 30 cigarettes a day down to 20. She has had 5 or 6 previous access attempts at smoking cessation. She has tried using Chantix in the past but stopped it due to concerns about side effects (she never actually had any side effects herself, but is worried by the commercials on television).   Recently she has had improvement in glycemic control with a hemoglobin A1c that was under 7%. She also had her cholesterol recently checked and this was "okay", but I do not have those lab results available for review.  She has a remote history of posttraumatic lower extremity DVT many years ago.  Past Medical History:  Diagnosis Date  . Arthritis   . Diabetes mellitus    non insulin  . History of blood clots   . History of gout    right ring finger  . Homocysteinemia ([place]) 11/29/2010  . Hypertension   . Kidney atrophy    rt.  . Left knee pain     Past Surgical History:  Procedure Laterality Date  . ABDOMINAL  HYSTERECTOMY    . ESOPHAGOGASTRODUODENOSCOPY N/A 07/02/2012   Procedure: ESOPHAGOGASTRODUODENOSCOPY (EGD);  Surgeon: Rogene Houston, MD;  [facility];  Service: Endoscopy;  Laterality: N/A;  . ESOPHAGOGASTRODUODENOSCOPY N/A 09/13/2014   Procedure: ESOPHAGOGASTRODUODENOSCOPY (EGD);  Surgeon: Danie Binder, MD;  [facility];  Service: Endoscopy;  Laterality: N/A;  . hypertension    . KNEE SURGERY     rt. arthroscopic  . NECK SURGERY    . TONSILLECTOMY    . type ll diabetes      Current Medications: Outpatient Medications Prior to Visit  Medication Sig Dispense Refill  . aspirin 81 MG tablet Take 81 mg by mouth every evening.     Marland Kitchen b complex vitamins tablet Take 1 tablet by mouth daily. Takes 2 in the morning.    . Cholecalciferol (VITAMIN D3) 1000 UNITS CAPS Take 1 tablet by mouth daily.      . folic acid (FOLVITE) 1 MG tablet TAKE ONE (1) TABLET BY MOUTH EVERY DAY 60 tablet 3  . glimepiride (AMARYL) 2 MG tablet Take 2 mg by mouth 2 (two) times daily.    Marland Kitchen linagliptin (TRADJENTA) 5 MG TABS tablet Take 5 mg by mouth daily.      . metFORMIN (GLUCOPHAGE) 500 MG tablet Take 500 mg by mouth 2 (two) times daily with a meal.    . Misc Natural Products ([place]) CAPS Take by mouth  2 (two) times daily.    Marland Kitchen NIFEDICAL XL 60 MG 24 hr tablet     . pantoprazole (PROTONIX) 40 MG tablet Take 40 mg by mouth daily.    . pioglitazone (ACTOS) 30 MG tablet Take 30 mg by mouth daily.      . simvastatin (ZOCOR) 20 MG tablet Take 20 mg by mouth At bedtime.    . Ascorbic Acid (VITAMIN C) 1000 MG tablet Take 1,000 mg by mouth daily.    Marland Kitchen atenolol (TENORMIN) 25 MG tablet Take 1 tablet (25 mg total) by mouth daily. 30 tablet 6  . fish oil-omega-3 fatty acids 1000 MG capsule Take 1 g by mouth daily.     . Flaxseed, Linseed, (EQL FLAX SEED OIL) 1000 MG CAPS Take by mouth daily.    . nicotine (NICODERM CQ - DOSED IN MG/24 HOURS) 14 mg/24hr patch Place 1 patch (14 mg total) onto the skin  daily. (Patient not taking: Reported on 10/20/2014) 28 patch 0  . polyethylene glycol (MIRALAX / GLYCOLAX) packet Take 17 g by mouth 2 (two) times daily with a meal. 14 each 0   No facility-administered medications prior to visit.      Allergies:   Ace inhibitors; Celecoxib; Hydrocodone-acetaminophen; and Zofran Alvis Lemmings hcl]   Social History   Social History  . Marital status: Married    Spouse name: N/A  . Number of children: N/A  . Years of education: N/A   Social History Main Topics  . Smoking status: Current Every Day Smoker    Packs/day: 0.50    Years: 50.00    Types: Cigarettes  . Smokeless tobacco: Never Used  . Alcohol use Yes     Comment: occasionally  . Drug use: No  . Sexual activity: Yes    Birth control/ protection: Surgical   Other Topics Concern  . None   Social History Narrative  . None     Family History:  The patient's family history includes Diabetes in her maternal aunt, maternal grandmother, maternal uncle, and son; Hypertension in her mother; Prostate cancer in her father; Stroke in her mother.   ROS:   Please see the history of present illness.    ROS All other systems reviewed and are negative.   PHYSICAL EXAM:   VS:  BP 140/68 (BP Location: Left Arm, Patient Position: Sitting, Cuff Size: Normal)   Pulse 80   Ht 5\' 10"  (1.778 m)   Wt 215 lb 6.4 oz (97.7 kg)   BMI 30.91 kg/m    GEN: Well nourished, well developed, in no acute distress  HEENT: normal  Neck: no JVD, bilateral right greater than left carotid bruits, no masses Cardiac: RRR; no murmurs, rubs, or gallops,no edema  Respiratory:  clear to auscultation bilaterally, normal work of breathing GI: soft, nontender, nondistended, + BS MS: no deformity or atrophy  Skin: warm and dry, no rash Neuro:  Alert and Oriented x 3, Strength and sensation are intact Psych: euthymic mood, full affect  Wt Readings from Last 3 Encounters:  11/07/15 215 lb 6.4 oz (97.7 kg)  11/26/14 209 lb  (94.8 kg)  10/20/14 209 lb 12.8 oz (95.2 kg)      Studies/Labs Reviewed:   EKG:  EKG is ordered today.  The ekg ordered today demonstrates Sinus rhythm, normal tracing, QTC 435 ms    ASSESSMENT:    1. Tobacco abuse   2. Essential hypertension   3. Type 2 diabetes mellitus without complication, without long-term current use of  insulin (HCC)   4. Obesity (BMI 30.0-34.9)   5. Hyperlipidemia   6. Right kidney mass   7. Bilateral carotid bruits      PLAN:  In order of problems listed above:  1. Tobacco abuse: Smoking cessation strongly encouraged. I think should probably do fine if he try Chantix again and she did report adduction in smoking craving when using it. However she is scared of this and wants to try using nicotine gum or lozenges. She developed a rash with nicotine patches. Discussed several ways to enhance the odds of smoking cessation success. 2. Hypertension: Adequate control 3. Diabetes mellitus type 2 on oral antidiabetic: She reports good control 4. Borderline obesity: I would focus on smoking cessation first but then should also direct her attention to weight loss 5. Hyperlipidemia: We'll try to retrieve her most recent results from her primary care physician. She is on statin. 6. Hypotrophic right kidney, possible right renal mass. MRI performed in August 2016 showed that this is consistent with a hemorrhagic/proteinaceous cyst that did not demonstrate enhancement. 7. Right > left carotid bruits: No neurological complaints. Multiple risk factors for carotid artery disease. Will check duplex ultrasound. Suspect the treatment will remain focused on risk factor modification. She is taking aspirin.    Medication Adjustments/Labs and Tests Ordered: Current medicines are reviewed at length with the patient today.  Concerns regarding medicines are outlined above.  Medication changes, Labs and Tests ordered today are listed in the Patient Instructions below. Patient  Instructions  Medication Instructions: Dr Sallyanne Kuster recommends that you continue on your current medications as directed. Please refer to the Current Medication list given to you today.  Labwork: NONE ORDERED  Testing/Procedures: 1. Carotid Duplex - Your physician has requested that you have a carotid duplex. This test is an ultrasound of the carotid arteries in your neck. It looks at blood flow through these arteries that supply the brain with blood. Allow one hour for this exam. There are no restrictions or special instructions.  Follow-up: Dr C recommends that you schedule a follow-up appointment in 1 year. You will receive a reminder letter in the mail two months in advance. If you don't receive a letter, please call our office to schedule the follow-up appointment.  If you need a refill on your cardiac medications before your next appointment, please call your pharmacy.    Signed, Sanda Klein, MD  11/08/2015 2:30 PM    [address], [place], [place]  [address] Phone: [phone]; Fax: [phone]

## 2015-11-08 DIAGNOSIS — R0989 Other specified symptoms and signs involving the circulatory and respiratory systems: Secondary | ICD-10-CM | POA: Insufficient documentation

## 2015-11-08 DIAGNOSIS — E785 Hyperlipidemia, unspecified: Secondary | ICD-10-CM | POA: Insufficient documentation

## 2015-11-08 DIAGNOSIS — E669 Obesity, unspecified: Secondary | ICD-10-CM | POA: Insufficient documentation

## 2015-11-09 DIAGNOSIS — M1000 Idiopathic gout, unspecified site: Secondary | ICD-10-CM | POA: Diagnosis not present

## 2015-11-09 DIAGNOSIS — Z6833 Body mass index (BMI) 33.0-33.9, adult: Secondary | ICD-10-CM | POA: Diagnosis not present

## 2015-11-23 ENCOUNTER — Ambulatory Visit (HOSPITAL_COMMUNITY)
Admission: RE | Admit: 2015-11-23 | Discharge: 2015-11-23 | Disposition: A | Discharge status: Home / Self Care | Payer: Medicare Other | Source: Ambulatory Visit | Attending: Cardiovascular Disease | Admitting: Cardiovascular Disease

## 2015-11-23 DIAGNOSIS — I10 Essential (primary) hypertension: Secondary | ICD-10-CM | POA: Diagnosis not present

## 2015-11-23 DIAGNOSIS — E119 Type 2 diabetes mellitus without complications: Secondary | ICD-10-CM | POA: Insufficient documentation

## 2015-11-23 DIAGNOSIS — I6523 Occlusion and stenosis of bilateral carotid arteries: Secondary | ICD-10-CM | POA: Insufficient documentation

## 2015-11-23 DIAGNOSIS — R0989 Other specified symptoms and signs involving the circulatory and respiratory systems: Secondary | ICD-10-CM | POA: Insufficient documentation

## 2015-12-16 ENCOUNTER — Other Ambulatory Visit (HOSPITAL_COMMUNITY): Payer: Self-pay | Admitting: Family Medicine

## 2015-12-16 DIAGNOSIS — E119 Type 2 diabetes mellitus without complications: Secondary | ICD-10-CM | POA: Diagnosis not present

## 2015-12-16 DIAGNOSIS — Z1389 Encounter for screening for other disorder: Secondary | ICD-10-CM | POA: Diagnosis not present

## 2015-12-16 DIAGNOSIS — E782 Mixed hyperlipidemia: Secondary | ICD-10-CM | POA: Diagnosis not present

## 2015-12-16 DIAGNOSIS — Z23 Encounter for immunization: Secondary | ICD-10-CM | POA: Diagnosis not present

## 2015-12-16 DIAGNOSIS — E6609 Other obesity due to excess calories: Secondary | ICD-10-CM | POA: Diagnosis not present

## 2015-12-16 DIAGNOSIS — E041 Nontoxic single thyroid nodule: Secondary | ICD-10-CM

## 2015-12-16 DIAGNOSIS — Z6833 Body mass index (BMI) 33.0-33.9, adult: Secondary | ICD-10-CM | POA: Diagnosis not present

## 2015-12-16 DIAGNOSIS — I10 Essential (primary) hypertension: Secondary | ICD-10-CM | POA: Diagnosis not present

## 2015-12-21 ENCOUNTER — Ambulatory Visit (HOSPITAL_COMMUNITY)
Admission: RE | Admit: 2015-12-21 | Discharge: 2015-12-21 | Disposition: A | Discharge status: Home / Self Care | Payer: Medicare Other | Source: Ambulatory Visit | Attending: Family Medicine | Admitting: Family Medicine

## 2015-12-21 DIAGNOSIS — E042 Nontoxic multinodular goiter: Secondary | ICD-10-CM | POA: Diagnosis not present

## 2015-12-21 DIAGNOSIS — E041 Nontoxic single thyroid nodule: Secondary | ICD-10-CM

## 2015-12-23 DIAGNOSIS — E1142 Type 2 diabetes mellitus with diabetic polyneuropathy: Secondary | ICD-10-CM | POA: Diagnosis not present

## 2015-12-23 DIAGNOSIS — B351 Tinea unguium: Secondary | ICD-10-CM | POA: Diagnosis not present

## 2016-01-04 ENCOUNTER — Other Ambulatory Visit (HOSPITAL_COMMUNITY)
Admission: RE | Admit: 2016-01-04 | Discharge: 2016-01-04 | Disposition: A | Discharge status: Home / Self Care | Payer: Medicare Other | Source: Ambulatory Visit | Attending: Endocrinology | Admitting: Endocrinology

## 2016-01-04 ENCOUNTER — Ambulatory Visit (INDEPENDENT_AMBULATORY_CARE_PROVIDER_SITE_OTHER): Payer: Medicare Other | Admitting: Endocrinology

## 2016-01-04 ENCOUNTER — Encounter: Payer: Self-pay | Admitting: Endocrinology

## 2016-01-04 DIAGNOSIS — E042 Nontoxic multinodular goiter: Secondary | ICD-10-CM | POA: Diagnosis not present

## 2016-01-04 DIAGNOSIS — E041 Nontoxic single thyroid nodule: Secondary | ICD-10-CM | POA: Diagnosis not present

## 2016-01-04 NOTE — Patient Instructions (Signed)
We'll let you know about the biopsy results. Please come back for a follow-up appointment in 6 months 

## 2016-01-04 NOTE — Progress Notes (Signed)
Subjective:    Patient ID: [name], female    DOB: [birth year], 70 y.o.   MRN: [id number]  HPI In Sept of 2017, pt was incidentally noted on carotid US to have a nodule at the thyroid.  No assoc pain.  She has slightly easy bruising.  she is unaware of ever having had thyroid problems in the past.  she has no h/o XRT to the neck.   Past Medical History:  Diagnosis Date  . Arthritis   . Diabetes mellitus    non insulin  . History of blood clots   . History of gout    right ring finger  . Homocysteinemia ([place]) 11/29/2010  . Hypertension   . Kidney atrophy    rt.  . Left knee pain   . Multinodular goiter     Past Surgical History:  Procedure Laterality Date  . ABDOMINAL HYSTERECTOMY    . ESOPHAGOGASTRODUODENOSCOPY N/A 07/02/2012   Procedure: ESOPHAGOGASTRODUODENOSCOPY (EGD);  Surgeon: Rogene Houston, MD;  [facility];  Service: Endoscopy;  Laterality: N/A;  . ESOPHAGOGASTRODUODENOSCOPY N/A 09/13/2014   Procedure: ESOPHAGOGASTRODUODENOSCOPY (EGD);  Surgeon: Danie Binder, MD;  [facility];  Service: Endoscopy;  Laterality: N/A;  . hypertension    . KNEE SURGERY     rt. arthroscopic  . NECK SURGERY    . TONSILLECTOMY    . type ll diabetes      Social History   Social History  . Marital status: Married    Spouse name: N/A  . Number of children: N/A  . Years of education: N/A   Occupational History  . Not on file.   Social History Main Topics  . Smoking status: Current Every Day Smoker    Packs/day: 0.50    Years: 50.00    Types: Cigarettes  . Smokeless tobacco: Never Used  . Alcohol use Yes     Comment: occasionally  . Drug use: No  . Sexual activity: Yes    Birth control/ protection: Surgical   Other Topics Concern  . Not on file   Social History Narrative  . No narrative on file    Current Outpatient Prescriptions on File Prior to Visit  Medication Sig Dispense Refill  . ALBUTEROL IN Inhale 2 puffs into the lungs as needed.     Marland Kitchen aspirin 81 MG tablet Take 81 mg by mouth every evening.     Marland Kitchen b complex vitamins tablet Take 1 tablet by mouth daily. Takes 2 in the morning.    . Cholecalciferol (VITAMIN D3) 1000 UNITS CAPS Take 1 tablet by mouth daily.      . Flaxseed, Linseed, (FLAXSEED OIL) 1200 MG CAPS Take 1 capsule by mouth daily.    . folic acid (FOLVITE) 1 MG tablet TAKE ONE (1) TABLET BY MOUTH EVERY DAY 60 tablet 3  . glimepiride (AMARYL) 2 MG tablet Take 2 mg by mouth 2 (two) times daily.    . Lido-Capsaicin-Men-Methyl Sal (MEDI-PATCH-LIDOCAINE EX) Apply 1 patch topically as needed.    . linagliptin (TRADJENTA) 5 MG TABS tablet Take 5 mg by mouth daily.      . metFORMIN (GLUCOPHAGE) 500 MG tablet Take 500 mg by mouth 2 (two) times daily with a meal.    . Misc Natural Products (JOINT HEALTH) CAPS Take by mouth 2 (two) times daily.    Marland Kitchen NIFEDICAL XL 60 MG 24 hr tablet     . Omega-3 Fatty Acids (FISH OIL) 1200 MG CAPS Take  1 capsule by mouth daily.    . pantoprazole (PROTONIX) 40 MG tablet Take 40 mg by mouth daily.    . pioglitazone (ACTOS) 30 MG tablet Take 30 mg by mouth daily.      . simvastatin (ZOCOR) 20 MG tablet Take 20 mg by mouth At bedtime.    . vitamin C (ASCORBIC ACID) 500 MG tablet Take 500 mg by mouth daily.     No current facility-administered medications on file prior to visit.     Allergies  Allergen Reactions  . Ace Inhibitors Swelling  . Celecoxib Other (See Comments)    REACTION: Kidney issue  . Hydrocodone-Acetaminophen Itching    BRAND NAME: VICODIN  . Zofran [Ondansetron Hcl]     hiccups    Family History  Problem Relation Age of Onset  . Stroke Mother   . Hypertension Mother   . Prostate cancer Father   . Diabetes Son   . Diabetes Maternal Aunt   . Diabetes Maternal Uncle   . Diabetes Maternal Grandmother   . Thyroid disease Neg Hx     BP (!) 148/82   Pulse 95   Wt 215 lb 12.8 oz (97.9 kg)   SpO2 95%   BMI 30.96 kg/m   Review of Systems Denies weight change,  hoarseness, visual loss, chest pain, sob, dysphagia, skin rash, depression, cold intolerance, headache, numbness, and rhinorrhea.      Objective:   Physical Exam VS: see vs page GEN: no distress HEAD: head: no deformity eyes: no periorbital swelling, no proptosis external nose and ears are normal mouth: no lesion seen NECK: Neck: a healed scar is present (C-spine procedure).  The thyroid has a multinodular surface.  The largest is the RLP noted on [place].   CHEST WALL: no deformity LUNGS: clear to auscultation CV: reg rate and rhythm, no murmur ABD: abdomen is soft, nontender.  no hepatosplenomegaly.  not distended.  no hernia.   MUSCULOSKELETAL: muscle bulk and strength are grossly normal.  no obvious joint swelling.  gait is normal and steady EXTEMITIES: no edema PULSES: no carotid bruit.   NEURO:  cn 2-12 grossly intact.   readily moves all 4's.  sensation is intact to touch on al 4's.  No tremor SKIN:  Normal texture and temperature.  No rash or suspicious lesion is visible.  Not diaphoretic. NODES:  None palpable at the neck PSYCH: alert, well-oriented.  Does not appear anxious nor depressed.   I have reviewed outside records, and summarized: Pt was noted on carotid US to have thyroid nodules, and referred here.  She did not notice the nodules  outside test results are reviewed: TSH=1.1  thyroid needle bx: consent obtained, signed form on chart The area is first sprayed with cooling agent local: xylocaine 2%, with epinephrine prep: alcohol pad 3 bxs are done with [id number] needles no complications    Assessment & Plan:  Multinodular goiter, new, uncertain etiology.

## 2016-01-07 ENCOUNTER — Ambulatory Visit (HOSPITAL_COMMUNITY)
Admission: RE | Admit: 2016-01-07 | Discharge: 2016-01-07 | Disposition: A | Discharge status: Home / Self Care | Payer: Medicare Other | Source: Ambulatory Visit | Attending: Registered Nurse | Admitting: Registered Nurse

## 2016-01-07 ENCOUNTER — Other Ambulatory Visit (HOSPITAL_COMMUNITY): Payer: Self-pay | Admitting: Registered Nurse

## 2016-01-07 DIAGNOSIS — M19011 Primary osteoarthritis, right shoulder: Secondary | ICD-10-CM | POA: Insufficient documentation

## 2016-01-07 DIAGNOSIS — R52 Pain, unspecified: Secondary | ICD-10-CM

## 2016-01-07 DIAGNOSIS — M25511 Pain in right shoulder: Secondary | ICD-10-CM | POA: Insufficient documentation

## 2016-01-09 ENCOUNTER — Telehealth: Payer: Self-pay

## 2016-01-09 DIAGNOSIS — Z1389 Encounter for screening for other disorder: Secondary | ICD-10-CM | POA: Diagnosis not present

## 2016-01-09 DIAGNOSIS — S46001A Unspecified injury of muscle(s) and tendon(s) of the rotator cuff of right shoulder, initial encounter: Secondary | ICD-10-CM | POA: Diagnosis not present

## 2016-01-09 DIAGNOSIS — Z6833 Body mass index (BMI) 33.0-33.9, adult: Secondary | ICD-10-CM | POA: Diagnosis not present

## 2016-01-09 DIAGNOSIS — M25511 Pain in right shoulder: Secondary | ICD-10-CM | POA: Diagnosis not present

## 2016-01-09 NOTE — Telephone Encounter (Signed)
Called and left message regarding no cancer seen, and no treatment for now. Gave call back number for patient to call to make appointment in 6 months.

## 2016-01-23 DIAGNOSIS — Z01812 Encounter for preprocedural laboratory examination: Secondary | ICD-10-CM | POA: Diagnosis not present

## 2016-01-23 DIAGNOSIS — M546 Pain in thoracic spine: Secondary | ICD-10-CM | POA: Diagnosis not present

## 2016-01-23 DIAGNOSIS — M542 Cervicalgia: Secondary | ICD-10-CM | POA: Diagnosis not present

## 2016-01-24 DIAGNOSIS — D3000 Benign neoplasm of unspecified kidney: Secondary | ICD-10-CM | POA: Diagnosis not present

## 2016-01-24 DIAGNOSIS — N261 Atrophy of kidney (terminal): Secondary | ICD-10-CM | POA: Diagnosis not present

## 2016-01-24 DIAGNOSIS — N3946 Mixed incontinence: Secondary | ICD-10-CM | POA: Diagnosis not present

## 2016-01-31 DIAGNOSIS — M542 Cervicalgia: Secondary | ICD-10-CM | POA: Diagnosis not present

## 2016-02-01 ENCOUNTER — Emergency Department (HOSPITAL_COMMUNITY): Payer: No Typology Code available for payment source

## 2016-02-01 ENCOUNTER — Emergency Department (HOSPITAL_COMMUNITY)
Admission: EM | Admit: 2016-02-01 | Discharge: 2016-02-01 | Disposition: A | Discharge status: Home / Self Care | Payer: No Typology Code available for payment source | Attending: Emergency Medicine | Admitting: Emergency Medicine

## 2016-02-01 ENCOUNTER — Encounter (HOSPITAL_COMMUNITY): Payer: Self-pay | Admitting: Emergency Medicine

## 2016-02-01 DIAGNOSIS — S299XXA Unspecified injury of thorax, initial encounter: Secondary | ICD-10-CM | POA: Diagnosis not present

## 2016-02-01 DIAGNOSIS — M62838 Other muscle spasm: Secondary | ICD-10-CM

## 2016-02-01 DIAGNOSIS — M25551 Pain in right hip: Secondary | ICD-10-CM | POA: Diagnosis not present

## 2016-02-01 DIAGNOSIS — S12190A Other displaced fracture of second cervical vertebra, initial encounter for closed fracture: Secondary | ICD-10-CM | POA: Diagnosis not present

## 2016-02-01 DIAGNOSIS — S3993XA Unspecified injury of pelvis, initial encounter: Secondary | ICD-10-CM | POA: Diagnosis not present

## 2016-02-01 DIAGNOSIS — R51 Headache: Secondary | ICD-10-CM | POA: Insufficient documentation

## 2016-02-01 DIAGNOSIS — M542 Cervicalgia: Secondary | ICD-10-CM | POA: Diagnosis not present

## 2016-02-01 DIAGNOSIS — T148XXA Other injury of unspecified body region, initial encounter: Secondary | ICD-10-CM

## 2016-02-01 DIAGNOSIS — R079 Chest pain, unspecified: Secondary | ICD-10-CM | POA: Diagnosis not present

## 2016-02-01 DIAGNOSIS — S46911A Strain of unspecified muscle, fascia and tendon at shoulder and upper arm level, right arm, initial encounter: Secondary | ICD-10-CM | POA: Diagnosis not present

## 2016-02-01 DIAGNOSIS — Z7984 Long term (current) use of oral hypoglycemic drugs: Secondary | ICD-10-CM | POA: Diagnosis not present

## 2016-02-01 DIAGNOSIS — S8012XA Contusion of left lower leg, initial encounter: Secondary | ICD-10-CM | POA: Diagnosis not present

## 2016-02-01 DIAGNOSIS — S0990XA Unspecified injury of head, initial encounter: Secondary | ICD-10-CM | POA: Diagnosis not present

## 2016-02-01 DIAGNOSIS — Y939 Activity, unspecified: Secondary | ICD-10-CM | POA: Diagnosis not present

## 2016-02-01 DIAGNOSIS — Y92410 Unspecified street and highway as the place of occurrence of the external cause: Secondary | ICD-10-CM | POA: Diagnosis not present

## 2016-02-01 DIAGNOSIS — E119 Type 2 diabetes mellitus without complications: Secondary | ICD-10-CM | POA: Insufficient documentation

## 2016-02-01 DIAGNOSIS — Y999 Unspecified external cause status: Secondary | ICD-10-CM | POA: Diagnosis not present

## 2016-02-01 DIAGNOSIS — S199XXA Unspecified injury of neck, initial encounter: Secondary | ICD-10-CM | POA: Diagnosis not present

## 2016-02-01 DIAGNOSIS — S3992XA Unspecified injury of lower back, initial encounter: Secondary | ICD-10-CM | POA: Diagnosis not present

## 2016-02-01 DIAGNOSIS — I10 Essential (primary) hypertension: Secondary | ICD-10-CM | POA: Insufficient documentation

## 2016-02-01 DIAGNOSIS — K5730 Diverticulosis of large intestine without perforation or abscess without bleeding: Secondary | ICD-10-CM | POA: Diagnosis not present

## 2016-02-01 LAB — CBC WITH DIFFERENTIAL/PLATELET
BASOS ABS: 0 10*3/uL (ref 0.0–0.1)
BASOS PCT: 0 %
EOS PCT: 1 %
Eosinophils Absolute: 0.1 10*3/uL (ref 0.0–0.7)
HEMATOCRIT: 38 % (ref 36.0–46.0)
Hemoglobin: 12.4 g/dL (ref 12.0–15.0)
Lymphocytes Relative: 23 %
Lymphs Abs: 1.7 10*3/uL (ref 0.7–4.0)
MCH: 29.7 pg (ref 26.0–34.0)
MCHC: 32.6 g/dL (ref 30.0–36.0)
MCV: 91.1 fL (ref 78.0–100.0)
MONOS PCT: 9 %
Monocytes Absolute: 0.7 10*3/uL (ref 0.1–1.0)
Neutro Abs: 4.9 10*3/uL (ref 1.7–7.7)
Neutrophils Relative %: 67 %
PLATELETS: 239 10*3/uL (ref 150–400)
RBC: 4.17 MIL/uL (ref 3.87–5.11)
RDW: 15.6 % — AB (ref 11.5–15.5)
WBC: 7.3 10*3/uL (ref 4.0–10.5)

## 2016-02-01 LAB — COMPREHENSIVE METABOLIC PANEL
ALBUMIN: 3.7 g/dL (ref 3.5–5.0)
ALT: 15 U/L (ref 14–54)
ANION GAP: 8 (ref 5–15)
AST: 27 U/L (ref 15–41)
Alkaline Phosphatase: 81 U/L (ref 38–126)
BILIRUBIN TOTAL: 0.4 mg/dL (ref 0.3–1.2)
BUN: 17 mg/dL (ref 6–20)
CHLORIDE: 104 mmol/L (ref 101–111)
CO2: 26 mmol/L (ref 22–32)
Calcium: 9.5 mg/dL (ref 8.9–10.3)
Creatinine, Ser: 1.41 mg/dL — ABNORMAL HIGH (ref 0.44–1.00)
GFR calc Af Amer: 42 mL/min — ABNORMAL LOW (ref >60)
GFR, EST NON AFRICAN AMERICAN: 36 mL/min — AB (ref >60)
GLUCOSE: 173 mg/dL — AB (ref 65–99)
Potassium: 4.1 mmol/L (ref 3.5–5.1)
Sodium: 138 mmol/L (ref 135–145)
TOTAL PROTEIN: 7.2 g/dL (ref 6.5–8.1)

## 2016-02-01 LAB — PROTIME-INR
INR: 0.89
Prothrombin Time: 12 seconds (ref 11.4–15.2)

## 2016-02-01 LAB — LIPASE, BLOOD: LIPASE: 32 U/L (ref 11–51)

## 2016-02-01 MED ORDER — OXYCODONE-ACETAMINOPHEN 5-325 MG PO TABS: 1.0000 | ORAL_TABLET | Freq: Three times a day (TID) | ORAL | 0 refills | Status: AC | PRN

## 2016-02-01 MED ORDER — FENTANYL CITRATE (PF) 100 MCG/2ML IJ SOLN
75.0000 ug | Freq: Once | INTRAMUSCULAR | Status: AC
  Administered 2016-02-01: 75 ug via INTRAVENOUS
  Filled 2016-02-01: qty 2

## 2016-02-01 MED ORDER — ACETAMINOPHEN 500 MG PO TABS: 1000.0000 mg | ORAL_TABLET | Freq: Three times a day (TID) | ORAL | 0 refills | Status: AC

## 2016-02-01 MED ORDER — METHOCARBAMOL 500 MG PO TABS: 500.0000 mg | ORAL_TABLET | Freq: Every day | ORAL | 0 refills | Status: AC

## 2016-02-01 MED ORDER — SODIUM CHLORIDE 0.9 % IV SOLN
Freq: Once | INTRAVENOUS | Status: AC
  Administered 2016-02-01: 19:00:00 via INTRAVENOUS

## 2016-02-01 MED ORDER — ACETAMINOPHEN 500 MG PO TABS
1000.0000 mg | ORAL_TABLET | Freq: Once | ORAL | Status: AC
  Administered 2016-02-01: 1000 mg via ORAL
  Filled 2016-02-01: qty 2

## 2016-02-01 MED ORDER — SODIUM CHLORIDE 0.9 % IV BOLUS (SEPSIS)
125.0000 mL | Freq: Once | INTRAVENOUS | Status: DC
  Administered 2016-02-01: 125 mL via INTRAVENOUS

## 2016-02-01 MED ORDER — OXYCODONE-ACETAMINOPHEN 5-325 MG PO TABS
1.0000 | ORAL_TABLET | Freq: Once | ORAL | Status: AC
  Administered 2016-02-01: 1 via ORAL
  Filled 2016-02-01: qty 1

## 2016-02-01 NOTE — ED Notes (Signed)
MD at bedside. 

## 2016-02-01 NOTE — ED Notes (Signed)
Pt transported to CT ?

## 2016-02-01 NOTE — ED Notes (Signed)
Pt verbalized understanding discharge instructions and denies any further needs or questions at this time. VS stable, ambulatory and steady gait.   
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
## 2016-02-01 NOTE — ED Notes (Signed)
Talked to ED-P about rate of NS and got a verbal on 125 mL per hour not 125 mL bolus

## 2016-02-01 NOTE — ED Notes (Addendum)
Pt ambulated in room. Pt c/o pain in the right side of her pelvis.  Pt was assisted by a nurse on each side but was able to hold weight by herself. When pt was taking a step with her right foot she would buckle from the pain, but was still holding herself up. Md aware.

## 2016-02-03 DIAGNOSIS — M542 Cervicalgia: Secondary | ICD-10-CM | POA: Diagnosis not present

## 2016-02-03 DIAGNOSIS — M25551 Pain in right hip: Secondary | ICD-10-CM | POA: Diagnosis not present

## 2016-02-03 DIAGNOSIS — S129XXA Fracture of neck, unspecified, initial encounter: Secondary | ICD-10-CM | POA: Diagnosis not present

## 2016-03-05 DIAGNOSIS — M4316 Spondylolisthesis, lumbar region: Secondary | ICD-10-CM | POA: Diagnosis not present

## 2016-03-05 DIAGNOSIS — M25551 Pain in right hip: Secondary | ICD-10-CM | POA: Diagnosis not present

## 2016-03-05 DIAGNOSIS — M546 Pain in thoracic spine: Secondary | ICD-10-CM | POA: Diagnosis not present

## 2016-03-05 DIAGNOSIS — S12190D Other displaced fracture of second cervical vertebra, subsequent encounter for fracture with routine healing: Secondary | ICD-10-CM | POA: Diagnosis not present

## 2016-03-13 DIAGNOSIS — M4316 Spondylolisthesis, lumbar region: Secondary | ICD-10-CM | POA: Diagnosis not present

## 2016-03-19 DIAGNOSIS — M542 Cervicalgia: Secondary | ICD-10-CM | POA: Diagnosis not present

## 2016-03-27 DIAGNOSIS — M5030 Other cervical disc degeneration, unspecified cervical region: Secondary | ICD-10-CM | POA: Diagnosis not present

## 2016-03-27 DIAGNOSIS — E1129 Type 2 diabetes mellitus with other diabetic kidney complication: Secondary | ICD-10-CM | POA: Diagnosis not present

## 2016-03-27 DIAGNOSIS — E119 Type 2 diabetes mellitus without complications: Secondary | ICD-10-CM | POA: Diagnosis not present

## 2016-03-27 DIAGNOSIS — Z1389 Encounter for screening for other disorder: Secondary | ICD-10-CM | POA: Diagnosis not present

## 2016-03-27 DIAGNOSIS — Z6833 Body mass index (BMI) 33.0-33.9, adult: Secondary | ICD-10-CM | POA: Diagnosis not present

## 2016-03-27 DIAGNOSIS — E6609 Other obesity due to excess calories: Secondary | ICD-10-CM | POA: Diagnosis not present

## 2016-03-27 DIAGNOSIS — N182 Chronic kidney disease, stage 2 (mild): Secondary | ICD-10-CM | POA: Diagnosis not present

## 2016-04-06 DIAGNOSIS — S12190D Other displaced fracture of second cervical vertebra, subsequent encounter for fracture with routine healing: Secondary | ICD-10-CM | POA: Diagnosis not present

## 2016-04-17 DIAGNOSIS — J343 Hypertrophy of nasal turbinates: Secondary | ICD-10-CM | POA: Diagnosis not present

## 2016-04-17 DIAGNOSIS — Z1389 Encounter for screening for other disorder: Secondary | ICD-10-CM | POA: Diagnosis not present

## 2016-04-17 DIAGNOSIS — I10 Essential (primary) hypertension: Secondary | ICD-10-CM | POA: Diagnosis not present

## 2016-04-17 DIAGNOSIS — J069 Acute upper respiratory infection, unspecified: Secondary | ICD-10-CM | POA: Diagnosis not present

## 2016-04-17 DIAGNOSIS — K219 Gastro-esophageal reflux disease without esophagitis: Secondary | ICD-10-CM | POA: Diagnosis not present

## 2016-04-17 DIAGNOSIS — R070 Pain in throat: Secondary | ICD-10-CM | POA: Diagnosis not present

## 2016-04-17 DIAGNOSIS — Z6834 Body mass index (BMI) 34.0-34.9, adult: Secondary | ICD-10-CM | POA: Diagnosis not present

## 2016-04-17 DIAGNOSIS — E782 Mixed hyperlipidemia: Secondary | ICD-10-CM | POA: Diagnosis not present

## 2016-04-17 DIAGNOSIS — H6123 Impacted cerumen, bilateral: Secondary | ICD-10-CM | POA: Diagnosis not present

## 2016-04-17 DIAGNOSIS — J329 Chronic sinusitis, unspecified: Secondary | ICD-10-CM | POA: Diagnosis not present

## 2016-04-17 DIAGNOSIS — E6609 Other obesity due to excess calories: Secondary | ICD-10-CM | POA: Diagnosis not present

## 2016-04-17 DIAGNOSIS — J209 Acute bronchitis, unspecified: Secondary | ICD-10-CM | POA: Diagnosis not present

## 2016-04-27 DIAGNOSIS — G5621 Lesion of ulnar nerve, right upper limb: Secondary | ICD-10-CM | POA: Diagnosis not present

## 2016-04-27 DIAGNOSIS — G5601 Carpal tunnel syndrome, right upper limb: Secondary | ICD-10-CM | POA: Diagnosis not present

## 2016-05-04 DIAGNOSIS — S12190D Other displaced fracture of second cervical vertebra, subsequent encounter for fracture with routine healing: Secondary | ICD-10-CM | POA: Diagnosis not present

## 2016-05-09 ENCOUNTER — Encounter (HOSPITAL_COMMUNITY): Payer: Self-pay | Admitting: Physical Therapy

## 2016-05-09 ENCOUNTER — Ambulatory Visit (HOSPITAL_COMMUNITY): Payer: No Typology Code available for payment source | Attending: Orthopedic Surgery | Admitting: Physical Therapy

## 2016-05-09 DIAGNOSIS — M542 Cervicalgia: Secondary | ICD-10-CM | POA: Insufficient documentation

## 2016-05-09 DIAGNOSIS — M6281 Muscle weakness (generalized): Secondary | ICD-10-CM | POA: Diagnosis not present

## 2016-05-09 DIAGNOSIS — M5412 Radiculopathy, cervical region: Secondary | ICD-10-CM | POA: Insufficient documentation

## 2016-05-09 NOTE — Patient Instructions (Signed)
  Cervical Isometrics  Press your head gently into your fingers (5 lbs pressure) without moving through the motion. A) Flexion  B) Lateral flexion (side bending)  D) Extension: Using 5 lbs of pressure, press your head into the wall while keeping your chin tucked.    Hold each position for 5 seconds and perform 2 sets of 10 repetitions.   See the neck excursions exercise on the next page.
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
## 2016-05-11 ENCOUNTER — Ambulatory Visit (HOSPITAL_COMMUNITY): Payer: No Typology Code available for payment source

## 2016-05-11 DIAGNOSIS — M6281 Muscle weakness (generalized): Secondary | ICD-10-CM

## 2016-05-11 DIAGNOSIS — M5412 Radiculopathy, cervical region: Secondary | ICD-10-CM | POA: Diagnosis not present

## 2016-05-11 DIAGNOSIS — M542 Cervicalgia: Secondary | ICD-10-CM | POA: Diagnosis not present

## 2016-05-11 NOTE — Therapy (Signed)
[place], [place], [address] Phone: [phone]   Fax:  [phone]  Physical Therapy Treatment  Patient Details  Name: [name] MRN: [id number] Date of Birth: 10/10/1945 Referring Provider: Melina Schools   Encounter Date: 05/11/2016      PT End of Session - 05/11/16 1309    Visit Number 2   Number of Visits 13   Date for PT Re-Evaluation 06/06/16   Authorization Type Medicare Part A and B (G codes 1/10)   Authorization Time Period 05/09/16 - 06/20/16   PT Start Time 1300   PT Stop Time 1344   PT Time Calculation (min) 44 min   Activity Tolerance Patient tolerated treatment well   Behavior During Therapy [facility] for tasks assessed/performed      Past Medical History:  Diagnosis Date  . Arthritis   . Diabetes mellitus    non insulin  . History of blood clots   . History of gout    right ring finger  . Homocysteinemia ([place]) 11/29/2010  . Hypertension   . Kidney atrophy    rt.  . Left knee pain   . Multinodular goiter     Past Surgical History:  Procedure Laterality Date  . ABDOMINAL HYSTERECTOMY    . ESOPHAGOGASTRODUODENOSCOPY N/A 07/02/2012   Procedure: ESOPHAGOGASTRODUODENOSCOPY (EGD);  Surgeon: Rogene Houston, MD;  [facility];  Service: Endoscopy;  Laterality: N/A;  . ESOPHAGOGASTRODUODENOSCOPY N/A 09/13/2014   Procedure: ESOPHAGOGASTRODUODENOSCOPY (EGD);  Surgeon: Danie Binder, MD;  [facility];  Service: Endoscopy;  Laterality: N/A;  . hypertension    . KNEE SURGERY     rt. arthroscopic  . NECK SURGERY    . TONSILLECTOMY    . type ll diabetes      There were no vitals filed for this visit.      Subjective Assessment - 05/11/16 1259    Subjective Pt stated compliance with HEP.  Reports increased soreness with pain scale 4/10 for neck.  Reports she has started weining off the soft cervical collar.    Pertinent History DVT, DM, tobacco abuse, gout, cervical fusion,  OA    Patient Stated Goals "get healthy"    Currently in Pain? Yes   Pain Score 4    Pain Location Neck   Pain Orientation Right;Left;Anterior;Posterior   Pain Descriptors / Indicators Sore;Aching   Pain Type Chronic pain   Pain Radiating Towards radiating down Rt UE to finger tips   Pain Onset More than a month ago   Pain Frequency Intermittent   Aggravating Factors  sleeping with cervical soft collar   Pain Relieving Factors putting cervical collar on   Effect of Pain on Daily Activities cannot perform PLOF based tasks, shopping, driving, etc.               [facility] Adult PT Treatment/Exercise - 05/11/16 0001      Neck Exercises: Seated   Cervical Isometrics Flexion;Extension;Right lateral flexion;Left lateral flexion;5 reps   Cervical Rotation Both;10 reps   Lateral Flexion 10 reps;Both   Other Seated Exercise 3D thoracic excursion     Manual Therapy   Manual Therapy Soft tissue mobilization   Manual therapy comments Manual complete separate rest of tx   Soft tissue mobilization cervical musculature with PROM per pt tolerance to improve cervical range                PT Education - 05/11/16 1313  Education provided Yes   Education Details Reviewed goals, assured compliance and correct form and technqiues with HEP and copy of eval given to pt.     Person(s) Educated Patient   Methods Explanation;Demonstration;Handout;Tactile cues;Verbal cues   Comprehension Verbalized understanding;Returned demonstration;Need further instruction          PT Short Term Goals - 05/09/16 1709      PT SHORT TERM GOAL #1   Title Pt will be able to perform HEP independently in order to maximize AROM and improve overall function.   Time 3   Period Weeks   Status New     PT SHORT TERM GOAL #2   Title Pt will have improved cervical AROM in all directions by 10 degrees or more with minimal to no pain to demonstrate improved function and maximize her QoL.   Baseline Eval:  flexion: 37 deg, ext 18 deg, R lateral flex 23 deg, L lat flex 20 deg, R rotation 42 deg, L rotation 28 deg   Time 3   Period Weeks   Status New     PT SHORT TERM GOAL #3   Title Pt will have improved R GH flex and abd AROM by 10 deg or more with minimal to no pain to maximize function at home and demonstrate improved overall function.   Baseline Eval: flexion 110 deg, abd 105 deg, with pain in shoulder and neck throughout   Time 3   Period Weeks   Status New           PT Long Term Goals - 05/09/16 1715      PT LONG TERM GOAL #1   Title Pt will have improved cervical AROM to [facility] with no pain to maximize function and home and with driving.   Baseline Eval: flexion: 37 deg, ext 18 deg, R lateral flex 23 deg, L lat flex 20 deg, R rotation 42 deg, L rotation 28 deg   Time 6   Period Weeks   Status New     PT LONG TERM GOAL #2   Title Pt will have improved R GH AROM to [facility] with no pain to maximize function at home and improve overall QoL.   Baseline Eval: flexion 110 deg, abd 105 deg, with pain in shoulder and neck throughout   Time 6   Period Weeks   Status New     PT LONG TERM GOAL #3   Title Pt will be able to drive and perform all household duties for 1 hour or > to demonstrate improved overall function and maximize QoL.   Baseline Eval: pt currently not driving and due to neck and shoulder pain is limited at home.   Time 6   Period Weeks   Status New               Plan - 05/11/16 1315    Clinical Impression Statement Reviewed goals, assured compliance and correct form and technqiues with HEP and copy of eval given to pt.  Session focus on improving cervical AROM.  Manual soft tissue mobilization complete to reduce overall tightness to assist with ROM and pain.  Therex focus on cervical and thoracic mobility with 3D excursion exercises and cervical musculature strengtheing with isometric exercises.  EOS pt reports pain reduced to 3/10 with improve range.  Pt  encouraged to continue neck mobility exercises and stay hydrated to reduce risk of headaches and pain.     Rehab Potential Fair   Clinical Impairments Affecting Rehab  Potential time since injury, PMH   PT Frequency 2x / week   PT Duration 6 weeks   PT Treatment/Interventions ADLs/Self Care Home Management;Biofeedback;Cryotherapy;Electrical Stimulation;Moist Heat;Functional mobility training;Therapeutic activities;Therapeutic exercise;Neuromuscular re-education;Patient/family education;Manual techniques;Passive range of motion;Dry needling;Taping   PT Next Visit Plan soft tissue mobilization to periscapular and cervical region, continue gentle cervical AROM, shoulder AROM in sidelying   PT Home Exercise Plan Eval- cervical isometrics (flex, ext, bil lat flex), gentle AROM in all directions      Patient will benefit from skilled therapeutic intervention in order to improve the following deficits and impairments:  Decreased activity tolerance, Decreased range of motion, Decreased strength, Hypomobility, Impaired sensation, Increased muscle spasms, Impaired UE functional use, Postural dysfunction, Pain  Visit Diagnosis: Cervicalgia  Muscle weakness (generalized)  Radiculopathy, cervical region     Problem List Patient Active Problem List   Diagnosis Date Noted  . Multinodular goiter   . Bilateral carotid bruits 11/08/2015  . Obesity (BMI 30.0-34.9) 11/08/2015  . Hyperlipidemia 11/08/2015  . Gastritis and gastroduodenitis   . Nausea with vomiting   . Exertional chest pain 08/13/2014  . Encounter for annual physical exam 05/12/2014  . Hypokalemia 07/03/2012  . Abdominal pain 07/01/2012  . Dehydration 07/01/2012  . Right kidney mass 07/01/2012  . Tobacco abuse 07/01/2012  . DM type 2 (diabetes mellitus, type 2) ([place]) 07/01/2012  . Chronic back pain 07/01/2012  . Abnormal gall bladder diagnostic imaging 07/01/2012  . Homocysteinemia ([place]) 11/29/2010  . Deep vein thrombosis (DVT)  ([place]) 11/13/2010  . ARTHRITIS, LEFT KNEE 04/21/2009  . Essential hypertension 04/21/2009   Ihor Austin, [place]; [place]  Aldona Lento 05/11/2016, 2:49 PM  [place] [address] [place], [place], [address] Phone: [phone]   Fax:  [phone]  Name: [name] MRN: [id number] Date of Birth: October 09, 1945

## 2016-05-16 ENCOUNTER — Ambulatory Visit (HOSPITAL_COMMUNITY): Payer: No Typology Code available for payment source | Admitting: Physical Therapy

## 2016-05-16 ENCOUNTER — Encounter (HOSPITAL_COMMUNITY): Payer: Self-pay | Admitting: Physical Therapy

## 2016-05-16 DIAGNOSIS — M5412 Radiculopathy, cervical region: Secondary | ICD-10-CM

## 2016-05-16 DIAGNOSIS — M6281 Muscle weakness (generalized): Secondary | ICD-10-CM | POA: Diagnosis not present

## 2016-05-16 DIAGNOSIS — M542 Cervicalgia: Secondary | ICD-10-CM

## 2016-05-16 NOTE — Therapy (Signed)
[place], [place], [address] Phone: [phone]   Fax:  [phone]  Physical Therapy Treatment  Patient Details  Name: [name] MRN: [id number] Date of Birth: Apr 08, 1945 Referring Provider: Melina Schools   Encounter Date: 05/16/2016      PT End of Session - 05/16/16 1612    Visit Number 3   Number of Visits 13   Date for PT Re-Evaluation 06/06/16   Authorization Type Medicare Part A and B (G codes 1/10)   Authorization Time Period 05/09/16 - 06/20/16   PT Start Time [id number]   PT Stop Time 1610   PT Time Calculation (min) 55 min   Activity Tolerance Patient tolerated treatment well   Behavior During Therapy [facility] for tasks assessed/performed      Past Medical History:  Diagnosis Date  . Arthritis   . Diabetes mellitus    non insulin  . History of blood clots   . History of gout    right ring finger  . Homocysteinemia ([place]) 11/29/2010  . Hypertension   . Kidney atrophy    rt.  . Left knee pain   . Multinodular goiter     Past Surgical History:  Procedure Laterality Date  . ABDOMINAL HYSTERECTOMY    . ESOPHAGOGASTRODUODENOSCOPY N/A 07/02/2012   Procedure: ESOPHAGOGASTRODUODENOSCOPY (EGD);  Surgeon: Rogene Houston, MD;  [facility];  Service: Endoscopy;  Laterality: N/A;  . ESOPHAGOGASTRODUODENOSCOPY N/A 09/13/2014   Procedure: ESOPHAGOGASTRODUODENOSCOPY (EGD);  Surgeon: Danie Binder, MD;  [facility];  Service: Endoscopy;  Laterality: N/A;  . hypertension    . KNEE SURGERY     rt. arthroscopic  . NECK SURGERY    . TONSILLECTOMY    . type ll diabetes      There were no vitals filed for this visit.      Subjective Assessment - 05/16/16 1514    Subjective Pt reports continued tightness and neck pain of 4/10 that just won't go away. Has been trying not to use cervical collar.   Pertinent History DVT, DM, tobacco abuse, gout, cervical fusion, OA    How long can you sit  comfortably? 2/7- few hours    How long can you stand comfortably? 2/7- not sure   How long can you walk comfortably? 2/7- neck does not bother her    Diagnostic tests images from 11/17 reveal intact cervical fusion site as well as stable anterior cervical fractures    Patient Stated Goals "get healthy"    Currently in Pain? Yes   Pain Score 4    Pain Location Neck   Pain Orientation Right;Left;Anterior;Posterior   Pain Descriptors / Indicators Aching;Sore   Pain Type Chronic pain   Pain Radiating Towards radiating down Rt UE finger tips   Pain Onset More than a month ago   Pain Frequency Intermittent   Aggravating Factors  sleeping with cervical soft collar   Pain Relieving Factors putting cervical collar on   Effect of Pain on Daily Activities cannot perform PLOF based tasks, shopping, driving, ect.                         [facility] Adult PT Treatment/Exercise - 05/16/16 0001      Neck Exercises: Seated   Cervical Isometrics Flexion;Extension;Right lateral flexion;Left lateral flexion;5 reps   Cervical Rotation Both;10 reps   Lateral Flexion 10 reps;Both   Other Seated Exercise --  Neck Exercises: Supine   Neck Retraction 10 reps   Shoulder ABduction Both;20 reps  Red tband   Upper Extremity D2 Extension;20 reps;Theraband   Theraband Level (UE D2) Level 2 (Red)   Other Supine Exercise External rotation  2x10 red tband     Modalities   Modalities Electrical Stimulation;Moist Heat     Moist Heat Therapy   Number Minutes Moist Heat 10 Minutes   Moist Heat Location Cervical     Electrical Stimulation   Electrical Stimulation Location Bil upper traps   Electrical Stimulation Action IFC   Electrical Stimulation Parameters 14 ma   Electrical Stimulation Goals Pain     Manual Therapy   Manual Therapy Soft tissue mobilization;Myofascial release   Manual therapy comments Manual complete separate rest of tx   Soft tissue mobilization cervical extensors  followed by activation to cervical flexors   Myofascial Release Suboccipitals                  PT Short Term Goals - 05/16/16 1559      PT SHORT TERM GOAL #1   Title Pt will be able to perform HEP independently in order to maximize AROM and improve overall function.   Time 3   Period Weeks   Status On-going     PT SHORT TERM GOAL #2   Title Pt will have improved cervical AROM in all directions by 10 degrees or more with minimal to no pain to demonstrate improved function and maximize her QoL.   Time 3   Period Weeks   Status On-going     PT SHORT TERM GOAL #3   Title Pt will have improved R GH flex and abd AROM by 10 deg or more with minimal to no pain to maximize function at home and demonstrate improved overall function.   Time 3   Period Weeks   Status On-going           PT Long Term Goals - 05/16/16 1600      PT LONG TERM GOAL #1   Title Pt will have improved cervical AROM to [facility] with no pain to maximize function and home and with driving.   Time 6   Period Weeks   Status On-going     PT LONG TERM GOAL #2   Title Pt will have improved R GH AROM to [facility] with no pain to maximize function at home and improve overall QoL.   Time 6   Period Weeks   Status On-going     PT LONG TERM GOAL #3   Title Pt will be able to drive and perform all household duties for 1 hour or > to demonstrate improved overall function and maximize QoL.   Time 6   Period Weeks   Status On-going               Plan - 05/16/16 1543    Clinical Impression Statement Pt demonstrates decreased capitol flexon, continues to use extensor muscles for flexon. Increased tone in Rt neck and upper trap. Pt able to toelrate strengthening exercsies well with some difficulty on Rt. Pt educated in [place] and its purpose as was agreeable to trying. Pt responded well. Pt will continue to benefit from skilled therapy for cervical strength and stability.    Rehab Potential Fair   Clinical  Impairments Affecting Rehab Potential time since injury, PMH   PT Frequency 2x / week   PT Duration 6 weeks   PT Treatment/Interventions ADLs/Self Care  Home Management;Biofeedback;Cryotherapy;Electrical Stimulation;Moist Heat;Functional mobility training;Therapeutic activities;Therapeutic exercise;Neuromuscular re-education;Patient/family education;Manual techniques;Passive range of motion;Dry needling;Taping   PT Next Visit Plan soft tissue mobilization to periscapular and cervical region, continue gentle cervical AROM, shoulder AROM in sidelying   Consulted and Agree with Plan of Care Patient      Patient will benefit from skilled therapeutic intervention in order to improve the following deficits and impairments:  Decreased activity tolerance, Decreased range of motion, Decreased strength, Hypomobility, Impaired sensation, Increased muscle spasms, Impaired UE functional use, Postural dysfunction, Pain  Visit Diagnosis: Cervicalgia  Muscle weakness (generalized)  Radiculopathy, cervical region     Problem List Patient Active Problem List   Diagnosis Date Noted  . Multinodular goiter   . Bilateral carotid bruits 11/08/2015  . Obesity (BMI 30.0-34.9) 11/08/2015  . Hyperlipidemia 11/08/2015  . Gastritis and gastroduodenitis   . Nausea with vomiting   . Exertional chest pain 08/13/2014  . Encounter for annual physical exam 05/12/2014  . Hypokalemia 07/03/2012  . Abdominal pain 07/01/2012  . Dehydration 07/01/2012  . Right kidney mass 07/01/2012  . Tobacco abuse 07/01/2012  . DM type 2 (diabetes mellitus, type 2) ([place]) 07/01/2012  . Chronic back pain 07/01/2012  . Abnormal gall bladder diagnostic imaging 07/01/2012  . Homocysteinemia ([place]) 11/29/2010  . Deep vein thrombosis (DVT) ([place]) 11/13/2010  . ARTHRITIS, LEFT KNEE 04/21/2009  . Essential hypertension 04/21/2009    Mikle Bosworth PTA 05/16/2016, 4:14 PM  [facility] [address] [place], [place], [address] Phone: [phone]   Fax:  [phone]  Name: DALISSA LEISINGER MRN: [id number] Date of Birth: [birth year]

## 2016-05-18 ENCOUNTER — Ambulatory Visit (HOSPITAL_COMMUNITY): Payer: No Typology Code available for payment source

## 2016-05-18 DIAGNOSIS — M5412 Radiculopathy, cervical region: Secondary | ICD-10-CM

## 2016-05-18 DIAGNOSIS — M6281 Muscle weakness (generalized): Secondary | ICD-10-CM

## 2016-05-18 DIAGNOSIS — M542 Cervicalgia: Secondary | ICD-10-CM

## 2016-05-18 NOTE — Therapy (Signed)
[place], [place], [address] Phone: [phone]   Fax:  [phone]  Physical Therapy Treatment  Patient Details  Name: [name] MRN: [id number] Date of Birth: 14-Apr-1945 Referring Provider: Melina Schools  Encounter Date: 05/18/2016      PT End of Session - 05/18/16 1638    Visit Number 4   Number of Visits 13   Date for PT Re-Evaluation 06/06/16   Authorization Type Medicare Part A and B (G codes 1/10)   Authorization Time Period 05/09/16 - 06/20/16   PT Start Time 1603   PT Stop Time 1648   PT Time Calculation (min) 45 min   Activity Tolerance Patient tolerated treatment well   Behavior During Therapy [facility] for tasks assessed/performed      Past Medical History:  Diagnosis Date  . Arthritis   . Diabetes mellitus    non insulin  . History of blood clots   . History of gout    right ring finger  . Homocysteinemia ([place]) 11/29/2010  . Hypertension   . Kidney atrophy    rt.  . Left knee pain   . Multinodular goiter     Past Surgical History:  Procedure Laterality Date  . ABDOMINAL HYSTERECTOMY    . ESOPHAGOGASTRODUODENOSCOPY N/A 07/02/2012   Procedure: ESOPHAGOGASTRODUODENOSCOPY (EGD);  Surgeon: Rogene Houston, MD;  [facility];  Service: Endoscopy;  Laterality: N/A;  . ESOPHAGOGASTRODUODENOSCOPY N/A 09/13/2014   Procedure: ESOPHAGOGASTRODUODENOSCOPY (EGD);  Surgeon: Danie Binder, MD;  [facility];  Service: Endoscopy;  Laterality: N/A;  . hypertension    . KNEE SURGERY     rt. arthroscopic  . NECK SURGERY    . TONSILLECTOMY    . type ll diabetes      There were no vitals filed for this visit.      Subjective Assessment - 05/18/16 1603    Subjective Pt stated she continues to have tightness and pain on back on neck with increased difficulty with rotation   Pertinent History DVT, DM, tobacco abuse, gout, cervical fusion, OA    Patient Stated Goals "get healthy"    Currently in Pain? Yes   Pain Score 4    Pain Location Neck   Pain Orientation Right;Left   Pain Descriptors / Indicators Sore   Pain Type Chronic pain   Pain Radiating Towards Reports reduction in radicular symptoms down Rt UE to finger tips   Pain Onset More than a month ago   Pain Frequency Intermittent   Aggravating Factors  sleeping with cervical soft collar   Pain Relieving Factors putting cervical collar on   Effect of Pain on Daily Activities cannot perform PLOF based tasks, shopping driving, etc.            [facility] PT Assessment - 05/18/16 0001      Assessment   Medical Diagnosis s/p fracture of cervical vertebrae    Referring Provider Melina Schools   Hand Dominance Right   Next MD Visit Dr. Rolena Infante in 6 weeks    Prior Therapy none      AROM   Cervical - Right Rotation 52  was 42   Cervical - Left Rotation 45  was 28                     [facility] Adult PT Treatment/Exercise - 05/18/16 0001      Neck Exercises: Seated   Cervical Isometrics Limitations HEP  Cervical Rotation Both;10 reps   Lateral Flexion 10 reps;Both   Lateral Flexion Limitations Reports of increased tingling to Rt UE with prolonged lateral flexion     Neck Exercises: Supine   Neck Retraction 15 reps;3 secs     Manual Therapy   Manual Therapy Soft tissue mobilization;Myofascial release   Manual therapy comments Manual complete separate rest of tx   Soft tissue mobilization Cervical musculature especailly upper traps, scalenes, levator scapula and cervical extensors; position release technqiue to UT to improve rotation   Myofascial Release suboccipital release and manual traction     Neck Exercises: Stretches   Upper Trapezius Stretch 30 seconds;1 rep  lateral flexion no UE A                  PT Short Term Goals - 05/16/16 1559      PT SHORT TERM GOAL #1   Title Pt will be able to perform HEP independently in order to maximize AROM and improve overall function.    Time 3   Period Weeks   Status On-going     PT SHORT TERM GOAL #2   Title Pt will have improved cervical AROM in all directions by 10 degrees or more with minimal to no pain to demonstrate improved function and maximize her QoL.   Time 3   Period Weeks   Status On-going     PT SHORT TERM GOAL #3   Title Pt will have improved R GH flex and abd AROM by 10 deg or more with minimal to no pain to maximize function at home and demonstrate improved overall function.   Time 3   Period Weeks   Status On-going           PT Long Term Goals - 05/16/16 1600      PT LONG TERM GOAL #1   Title Pt will have improved cervical AROM to [facility] with no pain to maximize function and home and with driving.   Time 6   Period Weeks   Status On-going     PT LONG TERM GOAL #2   Title Pt will have improved R GH AROM to [facility] with no pain to maximize function at home and improve overall QoL.   Time 6   Period Weeks   Status On-going     PT LONG TERM GOAL #3   Title Pt will be able to drive and perform all household duties for 1 hour or > to demonstrate improved overall function and maximize QoL.   Time 6   Period Weeks   Status On-going               Plan - 05/18/16 1734    Clinical Impression Statement Session foucs on improving cervical mobilty and manual technqiues to assist with pain.  Began session with manual soft tissue mobilization to reduce overall tightness with position release technqiues to upper traps to reduce overall tightness and improve cervical rotatin.  Pt with vast improvements with improved rotation following manual.  Therex focus on improving AROM and posture strengthening.  Added UT stretch with reports of increased radicular symptoms to Rt fingers with Lt sidebending for prolonged period of time.  Reviewed benefits of MHP to assist with relaxation of musculature and pain control.  EOS pt reports pain reduced to 2/10 with improve rotation.     Rehab Potential Fair   Clinical  Impairments Affecting Rehab Potential time since injury, PMH   PT Frequency 2x / week  PT Duration 6 weeks   PT Treatment/Interventions ADLs/Self Care Home Management;Biofeedback;Cryotherapy;Electrical Stimulation;Moist Heat;Functional mobility training;Therapeutic activities;Therapeutic exercise;Neuromuscular re-education;Patient/family education;Manual techniques;Passive range of motion;Dry needling;Taping   PT Next Visit Plan soft tissue mobilization to periscapular and cervical region, continue gentle cervical AROM, shoulder AROM in sidelying      Patient will benefit from skilled therapeutic intervention in order to improve the following deficits and impairments:  Decreased activity tolerance, Decreased range of motion, Decreased strength, Hypomobility, Impaired sensation, Increased muscle spasms, Impaired UE functional use, Postural dysfunction, Pain  Visit Diagnosis: Cervicalgia  Muscle weakness (generalized)  Radiculopathy, cervical region     Problem List Patient Active Problem List   Diagnosis Date Noted  . Multinodular goiter   . Bilateral carotid bruits 11/08/2015  . Obesity (BMI 30.0-34.9) 11/08/2015  . Hyperlipidemia 11/08/2015  . Gastritis and gastroduodenitis   . Nausea with vomiting   . Exertional chest pain 08/13/2014  . Encounter for annual physical exam 05/12/2014  . Hypokalemia 07/03/2012  . Abdominal pain 07/01/2012  . Dehydration 07/01/2012  . Right kidney mass 07/01/2012  . Tobacco abuse 07/01/2012  . DM type 2 (diabetes mellitus, type 2) ([place]) 07/01/2012  . Chronic back pain 07/01/2012  . Abnormal gall bladder diagnostic imaging 07/01/2012  . Homocysteinemia ([place]) 11/29/2010  . Deep vein thrombosis (DVT) ([place]) 11/13/2010  . ARTHRITIS, LEFT KNEE 04/21/2009  . Essential hypertension 04/21/2009   Ihor Austin, [place]; [place]  Aldona Lento 05/18/2016, 5:39 PM  [facility] [address] [place], [place], [address] Phone: [phone]   Fax:  [phone]  Name: [name] MRN: [id number] Date of Birth: April 15, 1945

## 2016-05-22 ENCOUNTER — Ambulatory Visit (HOSPITAL_COMMUNITY): Payer: No Typology Code available for payment source

## 2016-05-22 DIAGNOSIS — M6281 Muscle weakness (generalized): Secondary | ICD-10-CM | POA: Diagnosis not present

## 2016-05-22 DIAGNOSIS — M542 Cervicalgia: Secondary | ICD-10-CM | POA: Diagnosis not present

## 2016-05-22 DIAGNOSIS — M5412 Radiculopathy, cervical region: Secondary | ICD-10-CM | POA: Diagnosis not present

## 2016-05-22 NOTE — Therapy (Signed)
[place], [place], [address] Phone: [phone]   Fax:  [phone]  Physical Therapy Treatment  Patient Details  Name: [name] MRN: [id number] Date of Birth: 06-16-1945 Referring Provider: Melina Schools  Encounter Date: 05/22/2016      PT End of Session - 05/22/16 1532    Visit Number 5   Number of Visits 13   Date for PT Re-Evaluation 06/06/16   Authorization Type Medicare Part A and B (G codes 1/10)   Authorization Time Period 05/09/16 - 06/20/16   PT Start Time 1520   PT Stop Time 1610   PT Time Calculation (min) 50 min   Activity Tolerance Patient tolerated treatment well;No increased pain   Behavior During Therapy [facility] for tasks assessed/performed      Past Medical History:  Diagnosis Date  . Arthritis   . Diabetes mellitus    non insulin  . History of blood clots   . History of gout    right ring finger  . Homocysteinemia ([place]) 11/29/2010  . Hypertension   . Kidney atrophy    rt.  . Left knee pain   . Multinodular goiter     Past Surgical History:  Procedure Laterality Date  . ABDOMINAL HYSTERECTOMY    . ESOPHAGOGASTRODUODENOSCOPY N/A 07/02/2012   Procedure: ESOPHAGOGASTRODUODENOSCOPY (EGD);  Surgeon: Rogene Houston, MD;  [facility];  Service: Endoscopy;  Laterality: N/A;  . ESOPHAGOGASTRODUODENOSCOPY N/A 09/13/2014   Procedure: ESOPHAGOGASTRODUODENOSCOPY (EGD);  Surgeon: Danie Binder, MD;  [facility];  Service: Endoscopy;  Laterality: N/A;  . hypertension    . KNEE SURGERY     rt. arthroscopic  . NECK SURGERY    . TONSILLECTOMY    . type ll diabetes      There were no vitals filed for this visit.      Subjective Assessment - 05/22/16 1524    Subjective Pt stated she continues to have c/o tightness and soreness more Lt>Rt side with rotation.  Pt reports compliance with HEP, reports more awareness of posture.   Pertinent History DVT, DM, tobacco abuse,  gout, cervical fusion, OA    Patient Stated Goals "get healthy"    Currently in Pain? Yes   Pain Score 3    Pain Location Neck   Pain Orientation Left  More pain Lt side   Pain Descriptors / Indicators Sore;Tightness   Pain Type Chronic pain   Pain Radiating Towards Reports reduction in radicular symtpoms down Rt UE to finger tips; none today 2/20   Pain Onset More than a month ago   Pain Frequency Intermittent   Aggravating Factors  sleeping with cervical soft collar   Pain Relieving Factors putting cervical collar on   Effect of Pain on Daily Activities cannot perform PLOG based tasks, shopping, driving, etc.                           [facility] Adult PT Treatment/Exercise - 05/22/16 0001      Neck Exercises: Seated   Neck Retraction 10 reps   Cervical Rotation Both;10 reps   Lateral Flexion 10 reps;Both   Other Seated Exercise Scapular retraction 10x     Neck Exercises: Supine   Neck Retraction 15 reps;3 secs   Other Supine Exercise --     Manual Therapy   Manual Therapy Soft tissue mobilization;Myofascial release   Manual therapy comments Manual complete separate  rest of tx   Soft tissue mobilization Cervical musculature especailly upper traps, scalenes, levator scapula and cervical extensors; position release technqiue to UT to improve rotation   Myofascial Release suboccipital release and manual traction                  PT Short Term Goals - 05/16/16 1559      PT SHORT TERM GOAL #1   Title Pt will be able to perform HEP independently in order to maximize AROM and improve overall function.   Time 3   Period Weeks   Status On-going     PT SHORT TERM GOAL #2   Title Pt will have improved cervical AROM in all directions by 10 degrees or more with minimal to no pain to demonstrate improved function and maximize her QoL.   Time 3   Period Weeks   Status On-going     PT SHORT TERM GOAL #3   Title Pt will have improved R GH flex and abd AROM by  10 deg or more with minimal to no pain to maximize function at home and demonstrate improved overall function.   Time 3   Period Weeks   Status On-going           PT Long Term Goals - 05/16/16 1600      PT LONG TERM GOAL #1   Title Pt will have improved cervical AROM to [facility] with no pain to maximize function and home and with driving.   Time 6   Period Weeks   Status On-going     PT LONG TERM GOAL #2   Title Pt will have improved R GH AROM to [facility] with no pain to maximize function at home and improve overall QoL.   Time 6   Period Weeks   Status On-going     PT LONG TERM GOAL #3   Title Pt will be able to drive and perform all household duties for 1 hour or > to demonstrate improved overall function and maximize QoL.   Time 6   Period Weeks   Status On-going               Plan - 05/22/16 1555    Clinical Impression Statement Continued session focus on improving cervical mobiltiy with therex and manual techniques to address tight cervical musculature restrictions.  Positive reults following manual position release technqiues to upper traps, specific STM to scalenes and posterior musculature to reduce tightness with improve ROM and reduce pain.  EOS pt presents with pain reduced, improved cervical mobility and increased awareness of posture.   Rehab Potential Fair   Clinical Impairments Affecting Rehab Potential time since injury, PMH   PT Frequency 2x / week   PT Duration 6 weeks   PT Treatment/Interventions ADLs/Self Care Home Management;Biofeedback;Cryotherapy;Electrical Stimulation;Moist Heat;Functional mobility training;Therapeutic activities;Therapeutic exercise;Neuromuscular re-education;Patient/family education;Manual techniques;Passive range of motion;Dry needling;Taping   PT Next Visit Plan soft tissue mobilization to periscapular and cervical region, continue gentle cervical AROM, shoulder AROM in sidelying   PT Home Exercise Plan Eval- cervical isometrics (flex,  ext, bil lat flex), gentle AROM in all directions      Patient will benefit from skilled therapeutic intervention in order to improve the following deficits and impairments:  Decreased activity tolerance, Decreased range of motion, Decreased strength, Hypomobility, Impaired sensation, Increased muscle spasms, Impaired UE functional use, Postural dysfunction, Pain  Visit Diagnosis: Cervicalgia  Muscle weakness (generalized)  Radiculopathy, cervical region     Problem List  Patient Active Problem List   Diagnosis Date Noted  . Multinodular goiter   . Bilateral carotid bruits 11/08/2015  . Obesity (BMI 30.0-34.9) 11/08/2015  . Hyperlipidemia 11/08/2015  . Gastritis and gastroduodenitis   . Nausea with vomiting   . Exertional chest pain 08/13/2014  . Encounter for annual physical exam 05/12/2014  . Hypokalemia 07/03/2012  . Abdominal pain 07/01/2012  . Dehydration 07/01/2012  . Right kidney mass 07/01/2012  . Tobacco abuse 07/01/2012  . DM type 2 (diabetes mellitus, type 2) ([place]) 07/01/2012  . Chronic back pain 07/01/2012  . Abnormal gall bladder diagnostic imaging 07/01/2012  . Homocysteinemia ([place]) 11/29/2010  . Deep vein thrombosis (DVT) ([place]) 11/13/2010  . ARTHRITIS, LEFT KNEE 04/21/2009  . Essential hypertension 04/21/2009   Ihor Austin, [place]; [place]  Aldona Lento 05/22/2016, 4:18 PM  [facility] [address] [place], [place], [address] Phone: [phone]   Fax:  [phone]  Name: [name] MRN: [id number] Date of Birth: 1946/04/02

## 2016-05-24 ENCOUNTER — Ambulatory Visit (HOSPITAL_COMMUNITY): Payer: No Typology Code available for payment source | Admitting: Physical Therapy

## 2016-05-24 DIAGNOSIS — M542 Cervicalgia: Secondary | ICD-10-CM | POA: Diagnosis not present

## 2016-05-24 DIAGNOSIS — M6281 Muscle weakness (generalized): Secondary | ICD-10-CM

## 2016-05-24 DIAGNOSIS — M5412 Radiculopathy, cervical region: Secondary | ICD-10-CM | POA: Diagnosis not present

## 2016-05-24 NOTE — Therapy (Signed)
[place], [place], [address] Phone: [phone]   Fax:  [phone]  Physical Therapy Treatment  Patient Details  Name: [name] MRN: [id number] Date of Birth: 02-14-1946 Referring Provider: Melina Schools  Encounter Date: 05/24/2016      PT End of Session - 05/24/16 1513    Visit Number 6   Number of Visits 13   Date for PT Re-Evaluation 06/06/16   Authorization Type Medicare Part A and B (G codes 1/10)   Authorization Time Period 05/09/16 - 06/20/16   PT Start Time 1432   PT Stop Time 1512   PT Time Calculation (min) 40 min   Activity Tolerance Patient tolerated treatment well;No increased pain   Behavior During Therapy WFL for tasks assessed/performed      Past Medical History:  Diagnosis Date  . Arthritis   . Diabetes mellitus    non insulin  . History of blood clots   . History of gout    right ring finger  . Homocysteinemia ([place]) 11/29/2010  . Hypertension   . Kidney atrophy    rt.  . Left knee pain   . Multinodular goiter     Past Surgical History:  Procedure Laterality Date  . ABDOMINAL HYSTERECTOMY    . ESOPHAGOGASTRODUODENOSCOPY N/A 07/02/2012   Procedure: ESOPHAGOGASTRODUODENOSCOPY (EGD);  Surgeon: Rogene Houston, MD;  [facility];  Service: Endoscopy;  Laterality: N/A;  . ESOPHAGOGASTRODUODENOSCOPY N/A 09/13/2014   Procedure: ESOPHAGOGASTRODUODENOSCOPY (EGD);  Surgeon: Danie Binder, MD;  [facility];  Service: Endoscopy;  Laterality: N/A;  . hypertension    . KNEE SURGERY     rt. arthroscopic  . NECK SURGERY    . TONSILLECTOMY    . type ll diabetes      There were no vitals filed for this visit.      Subjective Assessment - 05/24/16 1434    Subjective Patient arrives today stating she is doing better, she is really trying to wean out of her neck brace and feels like the soft tissue work is helping. Most of her pain is on teh left side and on the back  of her neck, the right side of her neck is feeling better. She feels she is getting better and has increased ease of tasks.    Pertinent History DVT, DM, tobacco abuse, gout, cervical fusion, OA    Diagnostic tests images from 11/17 reveal intact cervical fusion site as well as stable anterior cervical fractures    Patient Stated Goals "get healthy"    Currently in Pain? Yes   Pain Score 3    Pain Location Neck   Pain Orientation Left                         [facility] Adult PT Treatment/Exercise - 05/24/16 0001      Neck Exercises: Seated   Shoulder Rolls Backwards;10 reps   Other Seated Exercise 3D cervical excursions 1x10   Other Seated Exercise scapular retractions 1x15; scapular depression 1x10  cues for form      Neck Exercises: Supine   Neck Retraction 15 reps;3 secs     Manual Therapy   Manual Therapy Soft tissue mobilization   Manual therapy comments Manual complete separate rest of tx   Soft tissue mobilization upper traps and cervical extensors/suboccipitals in supine  PT Education - 05/24/16 1512    Education provided Yes   Education Details tennis ball suboccitipal release technique    Person(s) Educated Patient   Methods Explanation   Comprehension Verbalized understanding          PT Short Term Goals - 05/16/16 1559      PT SHORT TERM GOAL #1   Title Pt will be able to perform HEP independently in order to maximize AROM and improve overall function.   Time 3   Period Weeks   Status On-going     PT SHORT TERM GOAL #2   Title Pt will have improved cervical AROM in all directions by 10 degrees or more with minimal to no pain to demonstrate improved function and maximize her QoL.   Time 3   Period Weeks   Status On-going     PT SHORT TERM GOAL #3   Title Pt will have improved R GH flex and abd AROM by 10 deg or more with minimal to no pain to maximize function at home and demonstrate improved overall function.   Time  3   Period Weeks   Status On-going           PT Long Term Goals - 05/16/16 1600      PT LONG TERM GOAL #1   Title Pt will have improved cervical AROM to [facility] with no pain to maximize function and home and with driving.   Time 6   Period Weeks   Status On-going     PT LONG TERM GOAL #2   Title Pt will have improved R GH AROM to [facility] with no pain to maximize function at home and improve overall QoL.   Time 6   Period Weeks   Status On-going     PT LONG TERM GOAL #3   Title Pt will be able to drive and perform all household duties for 1 hour or > to demonstrate improved overall function and maximize QoL.   Time 6   Period Weeks   Status On-going               Plan - 05/24/16 1513    Clinical Impression Statement Focused today's session primarily on manual to B upper traps and cervical extensors/suboccipitals today in supine with increased focus on L over R today due to ongoing soft tissue tightness and muscle knotting/trigger point formation. Patient reports reduced sensation of muscle stiffness after manual but continues to report pain approximately 3/10 today. Followed manual with postural strengthening and cervical exercises with cues for form especially for cervical retractions as patient tends to look down rather than performing true retraction. Patient does experience some finger numbness/tingling on the R with scapular retractions today.    Rehab Potential Fair   Clinical Impairments Affecting Rehab Potential time since injury, PMH   PT Frequency 2x / week   PT Duration 6 weeks   PT Treatment/Interventions ADLs/Self Care Home Management;Biofeedback;Cryotherapy;Electrical Stimulation;Moist Heat;Functional mobility training;Therapeutic activities;Therapeutic exercise;Neuromuscular re-education;Patient/family education;Manual techniques;Passive range of motion;Dry needling;Taping   PT Next Visit Plan soft tissue mobilization to periscapular and cervical region, continue  gentle cervical AROM, shoulder AROM in sidelying   PT Home Exercise Plan Eval- cervical isometrics (flex, ext, bil lat flex), gentle AROM in all directions   Consulted and Agree with Plan of Care Patient      Patient will benefit from skilled therapeutic intervention in order to improve the following deficits and impairments:  Decreased activity tolerance, Decreased range of  motion, Decreased strength, Hypomobility, Impaired sensation, Increased muscle spasms, Impaired UE functional use, Postural dysfunction, Pain  Visit Diagnosis: Cervicalgia  Muscle weakness (generalized)  Radiculopathy, cervical region     Problem List Patient Active Problem List   Diagnosis Date Noted  . Multinodular goiter   . Bilateral carotid bruits 11/08/2015  . Obesity (BMI 30.0-34.9) 11/08/2015  . Hyperlipidemia 11/08/2015  . Gastritis and gastroduodenitis   . Nausea with vomiting   . Exertional chest pain 08/13/2014  . Encounter for annual physical exam 05/12/2014  . Hypokalemia 07/03/2012  . Abdominal pain 07/01/2012  . Dehydration 07/01/2012  . Right kidney mass 07/01/2012  . Tobacco abuse 07/01/2012  . DM type 2 (diabetes mellitus, type 2) ([place]) 07/01/2012  . Chronic back pain 07/01/2012  . Abnormal gall bladder diagnostic imaging 07/01/2012  . Homocysteinemia ([place]) 11/29/2010  . Deep vein thrombosis (DVT) ([place]) 11/13/2010  . ARTHRITIS, LEFT KNEE 04/21/2009  . Essential hypertension 04/21/2009    Deniece Ree PT, DPT [facility] [address] [place], [place], [address] Phone: [phone]   Fax:  [phone]  Name: SADEY DEMSKY MRN: [id number] Date of Birth: 10/28/1945

## 2016-05-29 ENCOUNTER — Ambulatory Visit (HOSPITAL_COMMUNITY): Payer: No Typology Code available for payment source | Admitting: Physical Therapy

## 2016-05-29 DIAGNOSIS — M5412 Radiculopathy, cervical region: Secondary | ICD-10-CM | POA: Diagnosis not present

## 2016-05-29 DIAGNOSIS — M542 Cervicalgia: Secondary | ICD-10-CM

## 2016-05-29 DIAGNOSIS — M6281 Muscle weakness (generalized): Secondary | ICD-10-CM

## 2016-05-29 NOTE — Therapy (Signed)
[place], [place], [address] Phone: [phone]   Fax:  [phone]  Physical Therapy Treatment  Patient Details  Name: [name] MRN: [id number] Date of Birth: 10-14-1945 Referring Provider: Melina Schools  Encounter Date: 05/29/2016      PT End of Session - 05/29/16 1430    Visit Number 7   Number of Visits 13   Date for PT Re-Evaluation 06/06/16   Authorization Type Medicare Part A and B (G codes 1/10)   Authorization Time Period 05/09/16 - 06/20/16   PT Start Time [id number]   PT Stop Time 1430   PT Time Calculation (min) 41 min   Activity Tolerance Patient tolerated treatment well   Behavior During Therapy [facility] for tasks assessed/performed      Past Medical History:  Diagnosis Date  . Arthritis   . Diabetes mellitus    non insulin  . History of blood clots   . History of gout    right ring finger  . Homocysteinemia ([place]) 11/29/2010  . Hypertension   . Kidney atrophy    rt.  . Left knee pain   . Multinodular goiter     Past Surgical History:  Procedure Laterality Date  . ABDOMINAL HYSTERECTOMY    . ESOPHAGOGASTRODUODENOSCOPY N/A 07/02/2012   Procedure: ESOPHAGOGASTRODUODENOSCOPY (EGD);  Surgeon: Rogene Houston, MD;  [facility];  Service: Endoscopy;  Laterality: N/A;  . ESOPHAGOGASTRODUODENOSCOPY N/A 09/13/2014   Procedure: ESOPHAGOGASTRODUODENOSCOPY (EGD);  Surgeon: Danie Binder, MD;  [facility];  Service: Endoscopy;  Laterality: N/A;  . hypertension    . KNEE SURGERY     rt. arthroscopic  . NECK SURGERY    . TONSILLECTOMY    . type ll diabetes      There were no vitals filed for this visit.      Subjective Assessment - 05/29/16 1352    Subjective Patient arrives today reporting that she was quite sore and had some pain after last session; she is wondering how she is sleeping with her neck on pillows as she feels she can't quite get it right. She reports the pain  she had was a bad pain and she is not sure waht caused it.    Pertinent History DVT, DM, tobacco abuse, gout, cervical fusion, OA    Currently in Pain? Yes   Pain Score 3    Pain Location Neck   Pain Orientation Left   Pain Descriptors / Indicators Sore;Tightness   Pain Type Chronic pain   Pain Radiating Towards radicular symptoms down R UE down to fingertips    Pain Onset More than a month ago   Pain Frequency Intermittent   Aggravating Factors  not quite sure, not taking gabapentin    Pain Relieving Factors taking gabapentin    Effect of Pain on Daily Activities Just annoying                          [facility] Adult PT Treatment/Exercise - 05/29/16 0001      Neck Exercises: Supine   Neck Retraction 15 reps   Neck Retraction Limitations in supine   3 second holds    Cervical Rotation Both;10 reps   Cervical Rotation Limitations supine    Other Supine Exercise shoulder shrugs 1x15 supine    Other Supine Exercise shoulder blade retraction 1x10 supine      Manual Therapy   Manual Therapy Soft  tissue mobilization   Manual therapy comments Manual complete separate rest of tx   Soft tissue mobilization Cervical musculature especailly upper traps, scalenes, levator scapula and cervical extensors; position release technqiue to UT to improve rotation                PT Education - 05/29/16 1429    Education provided Yes   Education Details some pain during exercise is OK, sharp radiating pains are not; general discussion regarding progress but we will know more at re-assess    Person(s) Educated Patient   Methods Explanation   Comprehension Verbalized understanding          PT Short Term Goals - 05/16/16 1559      PT SHORT TERM GOAL #1   Title Pt will be able to perform HEP independently in order to maximize AROM and improve overall function.   Time 3   Period Weeks   Status On-going     PT SHORT TERM GOAL #2   Title Pt will have improved cervical  AROM in all directions by 10 degrees or more with minimal to no pain to demonstrate improved function and maximize her QoL.   Time 3   Period Weeks   Status On-going     PT SHORT TERM GOAL #3   Title Pt will have improved R GH flex and abd AROM by 10 deg or more with minimal to no pain to maximize function at home and demonstrate improved overall function.   Time 3   Period Weeks   Status On-going           PT Long Term Goals - 05/16/16 1600      PT LONG TERM GOAL #1   Title Pt will have improved cervical AROM to [facility] with no pain to maximize function and home and with driving.   Time 6   Period Weeks   Status On-going     PT LONG TERM GOAL #2   Title Pt will have improved R GH AROM to [facility] with no pain to maximize function at home and improve overall QoL.   Time 6   Period Weeks   Status On-going     PT LONG TERM GOAL #3   Title Pt will be able to drive and perform all household duties for 1 hour or > to demonstrate improved overall function and maximize QoL.   Time 6   Period Weeks   Status On-going               Plan - 05/29/16 1431    Clinical Impression Statement Focused majority of session on manual today in sitting with extensive focus on upper traps, scalenes, cervical extensors, rhomboids, and mid trap as well with significant pain relief noted post manual. Otherwise performed functional exercises in supine today for less challenging position due to pain exacerbation last session. Will plan to continue heavy manual focus and progression of exercises as tolerated moving forwards.    Rehab Potential Fair   Clinical Impairments Affecting Rehab Potential time since injury, PMH   PT Frequency 2x / week   PT Duration 6 weeks   PT Treatment/Interventions ADLs/Self Care Home Management;Biofeedback;Cryotherapy;Electrical Stimulation;Moist Heat;Functional mobility training;Therapeutic activities;Therapeutic exercise;Neuromuscular re-education;Patient/family  education;Manual techniques;Passive range of motion;Dry needling;Taping   PT Next Visit Plan soft tissue mobilization to periscapular and cervical region, continue gentle cervical AROM, shoulder AROM in sidelying   PT Home Exercise Plan Eval- cervical isometrics (flex, ext, bil lat flex), gentle AROM in  all directions   Consulted and Agree with Plan of Care Patient      Patient will benefit from skilled therapeutic intervention in order to improve the following deficits and impairments:  Decreased activity tolerance, Decreased range of motion, Decreased strength, Hypomobility, Impaired sensation, Increased muscle spasms, Impaired UE functional use, Postural dysfunction, Pain  Visit Diagnosis: Cervicalgia  Muscle weakness (generalized)  Radiculopathy, cervical region     Problem List Patient Active Problem List   Diagnosis Date Noted  . Multinodular goiter   . Bilateral carotid bruits 11/08/2015  . Obesity (BMI 30.0-34.9) 11/08/2015  . Hyperlipidemia 11/08/2015  . Gastritis and gastroduodenitis   . Nausea with vomiting   . Exertional chest pain 08/13/2014  . Encounter for annual physical exam 05/12/2014  . Hypokalemia 07/03/2012  . Abdominal pain 07/01/2012  . Dehydration 07/01/2012  . Right kidney mass 07/01/2012  . Tobacco abuse 07/01/2012  . DM type 2 (diabetes mellitus, type 2) ([place]) 07/01/2012  . Chronic back pain 07/01/2012  . Abnormal gall bladder diagnostic imaging 07/01/2012  . Homocysteinemia ([place]) 11/29/2010  . Deep vein thrombosis (DVT) ([place]) 11/13/2010  . ARTHRITIS, LEFT KNEE 04/21/2009  . Essential hypertension 04/21/2009    Deniece Ree PT, DPT [facility] [address] [place], [place], [address] Phone: [phone]   Fax:  [phone]  Name: NEETU BOKA MRN: [id number] Date of Birth: 01/25/1946

## 2016-05-31 ENCOUNTER — Ambulatory Visit (HOSPITAL_COMMUNITY): Payer: No Typology Code available for payment source | Attending: Orthopedic Surgery

## 2016-05-31 DIAGNOSIS — M6281 Muscle weakness (generalized): Secondary | ICD-10-CM | POA: Diagnosis not present

## 2016-05-31 DIAGNOSIS — M542 Cervicalgia: Secondary | ICD-10-CM | POA: Insufficient documentation

## 2016-05-31 DIAGNOSIS — M5412 Radiculopathy, cervical region: Secondary | ICD-10-CM | POA: Diagnosis not present

## 2016-05-31 NOTE — Therapy (Signed)
[place], [place], [address] Phone: [phone]   Fax:  [phone]  Physical Therapy Treatment  Patient Details  Name: [name] MRN: [id number] Date of Birth: May 09, 1945 Referring Provider: Melina Schools  Encounter Date: 05/31/2016      PT End of Session - 05/31/16 1508    Visit Number 8   Number of Visits 13   Date for PT Re-Evaluation 06/06/16   Authorization Type Medicare Part A and B (G codes 1/10)   Authorization Time Period 05/09/16 - 06/20/16   PT Start Time 1432   PT Stop Time 1512   PT Time Calculation (min) 40 min   Activity Tolerance Patient tolerated treatment well;Patient limited by pain   Behavior During Therapy [facility] for tasks assessed/performed      Past Medical History:  Diagnosis Date  . Arthritis   . Diabetes mellitus    non insulin  . History of blood clots   . History of gout    right ring finger  . Homocysteinemia ([place]) 11/29/2010  . Hypertension   . Kidney atrophy    rt.  . Left knee pain   . Multinodular goiter     Past Surgical History:  Procedure Laterality Date  . ABDOMINAL HYSTERECTOMY    . ESOPHAGOGASTRODUODENOSCOPY N/A 07/02/2012   Procedure: ESOPHAGOGASTRODUODENOSCOPY (EGD);  Surgeon: Rogene Houston, MD;  [facility];  Service: Endoscopy;  Laterality: N/A;  . ESOPHAGOGASTRODUODENOSCOPY N/A 09/13/2014   Procedure: ESOPHAGOGASTRODUODENOSCOPY (EGD);  Surgeon: Danie Binder, MD;  [facility];  Service: Endoscopy;  Laterality: N/A;  . hypertension    . KNEE SURGERY     rt. arthroscopic  . NECK SURGERY    . TONSILLECTOMY    . type ll diabetes      There were no vitals filed for this visit.      Subjective Assessment - 05/31/16 1435    Subjective Pt reports she is modestly improving. She is unclear about HEP compliance. She things that the left side continues to bother her more than anything. Has not updated her pillow yet at home.    Pertinent History DVT, DM, tobacco abuse, gout, cervical fusion, OA    Currently in Pain? Yes   Pain Score 3    Pain Location Neck   Pain Orientation Left                         [facility] Adult PT Treatment/Exercise - 05/31/16 0001      Neck Exercises: Seated   Other Seated Exercise Shrugs with retraction: 15x3sec     Neck Exercises: Supine   Neck Retraction 15 reps;5 secs  3 pillows cues for chin tuck as well.    Cervical Rotation Left;15 reps  15x3sec to left supine; cues for maintains chin tuck    Shoulder Flexion Both;Limitations   Shoulder Flexion Limitations 25x wth wand in supine     Moist Heat Therapy   Number Minutes Moist Heat 5 Minutes  concurrent with other activities this session     Manual Therapy   Manual Therapy Passive ROM;Manual Traction;Myofascial release;Joint mobilization   Joint Mobilization C2 Lateral glide to left: poor tolerance   1`x45sec   Soft tissue mobilization MFR post cervicla extensors:    Passive ROM Cervical flexion Stretch: 5x45sec (subboccipital)   Upper Cervical Rotation Stretch 5x30sec bilat (55*Rt, 30* Lt  PT Short Term Goals - 05/16/16 1559      PT SHORT TERM GOAL #1   Title Pt will be able to perform HEP independently in order to maximize AROM and improve overall function.   Time 3   Period Weeks   Status On-going     PT SHORT TERM GOAL #2   Title Pt will have improved cervical AROM in all directions by 10 degrees or more with minimal to no pain to demonstrate improved function and maximize her QoL.   Time 3   Period Weeks   Status On-going     PT SHORT TERM GOAL #3   Title Pt will have improved R GH flex and abd AROM by 10 deg or more with minimal to no pain to maximize function at home and demonstrate improved overall function.   Time 3   Period Weeks   Status On-going           PT Long Term Goals - 05/16/16 1600      PT LONG TERM GOAL #1   Title Pt will have improved  cervical AROM to [facility] with no pain to maximize function and home and with driving.   Time 6   Period Weeks   Status On-going     PT LONG TERM GOAL #2   Title Pt will have improved R GH AROM to [facility] with no pain to maximize function at home and improve overall QoL.   Time 6   Period Weeks   Status On-going     PT LONG TERM GOAL #3   Title Pt will be able to drive and perform all household duties for 1 hour or > to demonstrate improved overall function and maximize QoL.   Time 6   Period Weeks   Status On-going               Plan - 05/31/16 1508    Clinical Impression Statement Continued with manual to address areas of hypmobility pain and tightness. Pt gradually demonstrating some improved ROM passive and active. At end of session Lef tpain is somewhat increased, but lower down. No changes to POC at this time. Pt making progress toward most goals at this time.  Discussed dry needling with patient but she did not seem very interested at this time.    Rehab Potential Fair   Clinical Impairments Affecting Rehab Potential time since injury, PMH   PT Frequency 2x / week   PT Duration 6 weeks   PT Treatment/Interventions ADLs/Self Care Home Management;Biofeedback;Cryotherapy;Electrical Stimulation;Moist Heat;Functional mobility training;Therapeutic activities;Therapeutic exercise;Neuromuscular re-education;Patient/family education;Manual techniques;Passive range of motion;Dry needling;Taping   PT Next Visit Plan soft tissue mobilization to periscapular and cervical region, continue gentle cervical AROM, shoulder AROM in sidelying   PT Home Exercise Plan Eval- cervical isometrics (flex, ext, bil lat flex), gentle AROM in all directions      Patient will benefit from skilled therapeutic intervention in order to improve the following deficits and impairments:  Decreased activity tolerance, Decreased range of motion, Decreased strength, Hypomobility, Impaired sensation, Increased muscle  spasms, Impaired UE functional use, Postural dysfunction, Pain  Visit Diagnosis: Cervicalgia  Muscle weakness (generalized)  Radiculopathy, cervical region     Problem List Patient Active Problem List   Diagnosis Date Noted  . Multinodular goiter   . Bilateral carotid bruits 11/08/2015  . Obesity (BMI 30.0-34.9) 11/08/2015  . Hyperlipidemia 11/08/2015  . Gastritis and gastroduodenitis   . Nausea with vomiting   . Exertional chest pain 08/13/2014  .  Encounter for annual physical exam 05/12/2014  . Hypokalemia 07/03/2012  . Abdominal pain 07/01/2012  . Dehydration 07/01/2012  . Right kidney mass 07/01/2012  . Tobacco abuse 07/01/2012  . DM type 2 (diabetes mellitus, type 2) ([place]) 07/01/2012  . Chronic back pain 07/01/2012  . Abnormal gall bladder diagnostic imaging 07/01/2012  . Homocysteinemia ([place]) 11/29/2010  . Deep vein thrombosis (DVT) ([place]) 11/13/2010  . ARTHRITIS, LEFT KNEE 04/21/2009  . Essential hypertension 04/21/2009    3:17 PM, 05/31/16 Etta Grandchild, PT, DPT Physical Therapist at [facility] [phone] (office)     [facility] [address] [place], [place], [address] Phone: [phone]   Fax:  [phone]  Name: ESTHEFANI BASTIEN MRN: [id number] Date of Birth: [birth year]

## 2016-06-05 ENCOUNTER — Ambulatory Visit (HOSPITAL_COMMUNITY): Payer: No Typology Code available for payment source

## 2016-06-05 DIAGNOSIS — M542 Cervicalgia: Secondary | ICD-10-CM

## 2016-06-05 DIAGNOSIS — M5412 Radiculopathy, cervical region: Secondary | ICD-10-CM | POA: Diagnosis not present

## 2016-06-05 DIAGNOSIS — L851 Acquired keratosis [keratoderma] palmaris et plantaris: Secondary | ICD-10-CM | POA: Diagnosis not present

## 2016-06-05 DIAGNOSIS — B351 Tinea unguium: Secondary | ICD-10-CM | POA: Diagnosis not present

## 2016-06-05 DIAGNOSIS — M6281 Muscle weakness (generalized): Secondary | ICD-10-CM | POA: Diagnosis not present

## 2016-06-05 DIAGNOSIS — E1142 Type 2 diabetes mellitus with diabetic polyneuropathy: Secondary | ICD-10-CM | POA: Diagnosis not present

## 2016-06-05 NOTE — Therapy (Signed)
[place], [place], [address] Phone: [phone]   Fax:  [phone]  Physical Therapy Treatment  Patient Details  Name: [name] MRN: [id number] Date of Birth: [birth year] Referring Provider: Melina Schools  Encounter Date: 06/05/2016      PT End of Session - 06/05/16 1505    Visit Number 9   Number of Visits 13   Date for PT Re-Evaluation 06/06/16   Authorization Type Medicare Part A and B (G codes 1/10)   Authorization Time Period 05/09/16 - 06/20/16   PT Start Time 1435   PT Stop Time 1513   PT Time Calculation (min) 38 min   Activity Tolerance Patient tolerated treatment well;No increased pain  Reports minimal tightness and no increased pain   Behavior During Therapy WFL for tasks assessed/performed      Past Medical History:  Diagnosis Date  . Arthritis   . Diabetes mellitus    non insulin  . History of blood clots   . History of gout    right ring finger  . Homocysteinemia ([place]) 11/29/2010  . Hypertension   . Kidney atrophy    rt.  . Left knee pain   . Multinodular goiter     Past Surgical History:  Procedure Laterality Date  . ABDOMINAL HYSTERECTOMY    . ESOPHAGOGASTRODUODENOSCOPY N/A 07/02/2012   Procedure: ESOPHAGOGASTRODUODENOSCOPY (EGD);  Surgeon: Rogene Houston, MD;  [facility];  Service: Endoscopy;  Laterality: N/A;  . ESOPHAGOGASTRODUODENOSCOPY N/A 09/13/2014   Procedure: ESOPHAGOGASTRODUODENOSCOPY (EGD);  Surgeon: Danie Binder, MD;  [facility];  Service: Endoscopy;  Laterality: N/A;  . hypertension    . KNEE SURGERY     rt. arthroscopic  . NECK SURGERY    . TONSILLECTOMY    . type ll diabetes      There were no vitals filed for this visit.      Subjective Assessment - 06/05/16 1435    Subjective Pt reports she is making progress, continues to have tightness on Lt side limiting range of motion.  Current pain scale 3/10.   Pertinent History DVT,  DM, tobacco abuse, gout, cervical fusion, OA    Patient Stated Goals "get healthy"    Currently in Pain? Yes   Pain Score 3    Pain Location Neck   Pain Orientation Left   Pain Type Chronic pain   Pain Onset More than a month ago   Pain Frequency Intermittent   Aggravating Factors  not quite sure, not taking gabapentin   Pain Relieving Factors taking gabapentin   Effect of Pain on Daily Activities just annoying                         [facility] Adult PT Treatment/Exercise - 06/05/16 0001      Neck Exercises: Seated   Cervical Rotation Both;10 reps   Shoulder Rolls Backwards;10 reps     Neck Exercises: Supine   Neck Retraction 15 reps;5 secs   Neck Retraction Limitations in supine      Manual Therapy   Manual Therapy Soft tissue mobilization;Passive ROM   Manual therapy comments Manual complete separate rest of tx   Soft tissue mobilization Upper traps, Scalenes and cervical extensores   Myofascial Release suboccipital release and manual traction; position release technqiue to UT Bil   Passive ROM Cervical ROM  PT Short Term Goals - 05/16/16 1559      PT SHORT TERM GOAL #1   Title Pt will be able to perform HEP independently in order to maximize AROM and improve overall function.   Time 3   Period Weeks   Status On-going     PT SHORT TERM GOAL #2   Title Pt will have improved cervical AROM in all directions by 10 degrees or more with minimal to no pain to demonstrate improved function and maximize her QoL.   Time 3   Period Weeks   Status On-going     PT SHORT TERM GOAL #3   Title Pt will have improved R GH flex and abd AROM by 10 deg or more with minimal to no pain to maximize function at home and demonstrate improved overall function.   Time 3   Period Weeks   Status On-going           PT Long Term Goals - 05/16/16 1600      PT LONG TERM GOAL #1   Title Pt will have improved cervical AROM to [facility] with no pain to  maximize function and home and with driving.   Time 6   Period Weeks   Status On-going     PT LONG TERM GOAL #2   Title Pt will have improved R GH AROM to [facility] with no pain to maximize function at home and improve overall QoL.   Time 6   Period Weeks   Status On-going     PT LONG TERM GOAL #3   Title Pt will be able to drive and perform all household duties for 1 hour or > to demonstrate improved overall function and maximize QoL.   Time 6   Period Weeks   Status On-going               Plan - 06/05/16 1513    Clinical Impression Statement Session foucs on improving cervical ROM primarly with rotation.  Manual soft tissue mobilization, passive stretches and position release techniques complete to reduce tightness and improve range.  EOS pt presents improved AROM, decreased pain and more awareness with compensation of thoracic spine.     Rehab Potential Fair   Clinical Impairments Affecting Rehab Potential time since injury, PMH   PT Frequency 2x / week   PT Duration 6 weeks   PT Treatment/Interventions ADLs/Self Care Home Management;Biofeedback;Cryotherapy;Electrical Stimulation;Moist Heat;Functional mobility training;Therapeutic activities;Therapeutic exercise;Neuromuscular re-education;Patient/family education;Manual techniques;Passive range of motion;Dry needling;Taping   PT Next Visit Plan soft tissue mobilization to periscapular and cervical region, continue gentle cervical AROM, shoulder AROM in sidelying      Patient will benefit from skilled therapeutic intervention in order to improve the following deficits and impairments:  Decreased activity tolerance, Decreased range of motion, Decreased strength, Hypomobility, Impaired sensation, Increased muscle spasms, Impaired UE functional use, Postural dysfunction, Pain  Visit Diagnosis: Cervicalgia  Muscle weakness (generalized)  Radiculopathy, cervical region     Problem List Patient Active Problem List   Diagnosis  Date Noted  . Multinodular goiter   . Bilateral carotid bruits 11/08/2015  . Obesity (BMI 30.0-34.9) 11/08/2015  . Hyperlipidemia 11/08/2015  . Gastritis and gastroduodenitis   . Nausea with vomiting   . Exertional chest pain 08/13/2014  . Encounter for annual physical exam 05/12/2014  . Hypokalemia 07/03/2012  . Abdominal pain 07/01/2012  . Dehydration 07/01/2012  . Right kidney mass 07/01/2012  . Tobacco abuse 07/01/2012  . DM type 2 (diabetes mellitus,  type 2) ([place]) 07/01/2012  . Chronic back pain 07/01/2012  . Abnormal gall bladder diagnostic imaging 07/01/2012  . Homocysteinemia ([place]) 11/29/2010  . Deep vein thrombosis (DVT) ([place]) 11/13/2010  . ARTHRITIS, LEFT KNEE 04/21/2009  . Essential hypertension 04/21/2009  Ihor Austin, [place]; [place]  Aldona Lento 06/05/2016, 5:08 PM  [place] [address] [place], [place], [address] Phone: [phone]   Fax:  [phone]  Name: [name] MRN: [id number] Date of Birth: 05/02/1945

## 2016-06-07 ENCOUNTER — Ambulatory Visit (HOSPITAL_COMMUNITY): Payer: No Typology Code available for payment source | Admitting: Physical Therapy

## 2016-06-07 DIAGNOSIS — M542 Cervicalgia: Secondary | ICD-10-CM

## 2016-06-07 DIAGNOSIS — M6281 Muscle weakness (generalized): Secondary | ICD-10-CM | POA: Diagnosis not present

## 2016-06-07 DIAGNOSIS — M5412 Radiculopathy, cervical region: Secondary | ICD-10-CM | POA: Diagnosis not present

## 2016-06-07 NOTE — Therapy (Addendum)
[place], [place], [address] Phone: [phone]   Fax:  [phone]  Physical Therapy Treatment (G-codes)  Patient Details  Name: [name] MRN: [id number] Date of Birth: 1945/12/14 Referring Provider: Melina Schools  Encounter Date: 06/07/2016      PT End of Session - 06/07/16 1549    Visit Number 10   Number of Visits 13   Date for PT Re-Evaluation 06/14/16   Authorization Type Medicare Part A and B (G codes 1/10)   Authorization Time Period 05/09/16 - 06/20/16   PT Start Time 8891   PT Stop Time 1519   PT Time Calculation (min) 40 min   Activity Tolerance Patient tolerated treatment well;No increased pain   Behavior During Therapy WFL for tasks assessed/performed      Past Medical History:  Diagnosis Date  . Arthritis   . Diabetes mellitus    non insulin  . History of blood clots   . History of gout    right ring finger  . Homocysteinemia ([place]) 11/29/2010  . Hypertension   . Kidney atrophy    rt.  . Left knee pain   . Multinodular goiter     Past Surgical History:  Procedure Laterality Date  . ABDOMINAL HYSTERECTOMY    . ESOPHAGOGASTRODUODENOSCOPY N/A 07/02/2012   Procedure: ESOPHAGOGASTRODUODENOSCOPY (EGD);  Surgeon: Rogene Houston, MD;  [facility];  Service: Endoscopy;  Laterality: N/A;  . ESOPHAGOGASTRODUODENOSCOPY N/A 09/13/2014   Procedure: ESOPHAGOGASTRODUODENOSCOPY (EGD);  Surgeon: Danie Binder, MD;  [facility];  Service: Endoscopy;  Laterality: N/A;  . hypertension    . KNEE SURGERY     rt. arthroscopic  . NECK SURGERY    . TONSILLECTOMY    . type ll diabetes      There were no vitals filed for this visit.      Subjective Assessment - 06/07/16 1441    Subjective Pt reports that she is making progress but is not where it should be. She continues to perform her HEP without difficulty.    Pertinent History DVT, DM, tobacco abuse, gout, cervical fusion,  OA    Patient Stated Goals "get healthy"    Currently in Pain? Yes   Pain Score 3    Pain Location Neck   Pain Orientation Left   Pain Descriptors / Indicators Aching   Pain Type Chronic pain   Pain Onset More than a month ago   Pain Frequency Intermittent   Aggravating Factors  unsure    Pain Relieving Factors heat    Effect of Pain on Daily Activities annoying                          [facility] Adult PT Treatment/Exercise - 06/07/16 0001      Neck Exercises: Seated   Cervical Rotation 10 reps;Left;Right   Shoulder Rolls Forwards;20 reps;Backwards   Other Seated Exercise seated rows with red TB 2x10 reps     Manual Therapy   Manual therapy comments Manual complete separate rest of tx   Soft tissue mobilization Lt upper trap   Myofascial Release sub occipital release Lt side only      Neck Exercises: Stretches   Upper Trapezius Stretch 3 reps;30 seconds   Other Neck Stretches gentle cervical rotation stretch with towel 2x10 sec each side                PT Education -  06/07/16 1549    Education provided Yes   Education Details discussed upcmoing re-evaluation and what all that includes   Person(s) Educated Patient   Methods Explanation   Comprehension Verbalized understanding          PT Short Term Goals - 05/16/16 1559      PT SHORT TERM GOAL #1   Title Pt will be able to perform HEP independently in order to maximize AROM and improve overall function.   Time 3   Period Weeks   Status On-going     PT SHORT TERM GOAL #2   Title Pt will have improved cervical AROM in all directions by 10 degrees or more with minimal to no pain to demonstrate improved function and maximize her QoL.   Time 3   Period Weeks   Status On-going     PT SHORT TERM GOAL #3   Title Pt will have improved R GH flex and abd AROM by 10 deg or more with minimal to no pain to maximize function at home and demonstrate improved overall function.   Time 3   Period Weeks    Status On-going           PT Long Term Goals - 05/16/16 1600      PT LONG TERM GOAL #1   Title Pt will have improved cervical AROM to [facility] with no pain to maximize function and home and with driving.   Time 6   Period Weeks   Status On-going     PT LONG TERM GOAL #2   Title Pt will have improved R GH AROM to [facility] with no pain to maximize function at home and improve overall QoL.   Time 6   Period Weeks   Status On-going     PT LONG TERM GOAL #3   Title Pt will be able to drive and perform all household duties for 1 hour or > to demonstrate improved overall function and maximize QoL.   Time 6   Period Weeks   Status On-going               Plan - 06/07/16 1550    Clinical Impression Statement Today's session focused on improving muscle spasm and cervical ROM. Performed several stretches and performed manual techniques to address noted tightness and spasm along her Lt upper trap and sub occipital region. Pt overall making progress towards, goals but continues to feel limited in certain areas, mostly with ongoing stiffness and discomfort. Discussed upcoming re-evaluation to determine need for more visits and she verbalized understanding with this. Ended without any reported increase in pain.   Rehab Potential Fair   Clinical Impairments Affecting Rehab Potential time since injury, PMH   PT Frequency 2x / week   PT Duration 6 weeks   PT Treatment/Interventions ADLs/Self Care Home Management;Biofeedback;Cryotherapy;Electrical Stimulation;Moist Heat;Functional mobility training;Therapeutic activities;Therapeutic exercise;Neuromuscular re-education;Patient/family education;Manual techniques;Passive range of motion;Dry needling;Taping   PT Next Visit Plan continue gentle cervical AROM (Lt rotation specifically), STM to address sub occipital spasm/tightness Lt>Rt   PT Home Exercise Plan Eval- cervical isometrics (flex, ext, bil lat flex), gentle AROM in all directions   Consulted and  Agree with Plan of Care Patient      Patient will benefit from skilled therapeutic intervention in order to improve the following deficits and impairments:  Decreased activity tolerance, Decreased range of motion, Decreased strength, Hypomobility, Impaired sensation, Increased muscle spasms, Impaired UE functional use, Postural dysfunction, Pain  Visit Diagnosis: Cervicalgia  Muscle  weakness (generalized)  Radiculopathy, cervical region       G-Codes - 06-24-2016 1554    Functional Assessment Tool Used (Outpatient Only) Clinical judgement based on pt subjective report   Functional Limitation Changing and maintaining body position   Changing and Maintaining Body Position Current Status (O1561) At least 60 percent but less than 80 percent impaired, limited or restricted      Problem List Patient Active Problem List   Diagnosis Date Noted  . Multinodular goiter   . Bilateral carotid bruits 11/08/2015  . Obesity (BMI 30.0-34.9) 11/08/2015  . Hyperlipidemia 11/08/2015  . Gastritis and gastroduodenitis   . Nausea with vomiting   . Exertional chest pain 08/13/2014  . Encounter for annual physical exam 05/12/2014  . Hypokalemia 07/03/2012  . Abdominal pain 07/01/2012  . Dehydration 07/01/2012  . Right kidney mass 07/01/2012  . Tobacco abuse 07/01/2012  . DM type 2 (diabetes mellitus, type 2) ([place]) 07/01/2012  . Chronic back pain 07/01/2012  . Abnormal gall bladder diagnostic imaging 07/01/2012  . Homocysteinemia ([place]) 11/29/2010  . Deep vein thrombosis (DVT) ([place]) 11/13/2010  . ARTHRITIS, LEFT KNEE 04/21/2009  . Essential hypertension 04/21/2009    3:57 PM,06-24-2016 Elly Modena PT, DPT [facility] [place] [address] [place], [place], [address] Phone: [phone]   Fax:  [phone]  Name: [name] MRN: [id number] Date of Birth: [birth year]   *Addendum to include goal  C-codes   Changing and maintaining body positions, Goal:  At least 20 percent but less than 40 percent impaired, limited or restricted   11:13 AM,08/14/16 Elly Modena PT, DPT [facility] [phone]

## 2016-06-09 IMAGING — US US ABDOMEN LIMITED
1 series · 14 of 25 positions shown · non-contrast
Comparison: CT of the abdomen and pelvis 09/09/2014.

CLINICAL DATA: 69-year-old female with abdominal pain since
09/08/2014, currently with nausea and vomiting.

EXAM:
US ABDOMEN LIMITED - RIGHT UPPER QUADRANT

[Series 1: us abdomen limited · 0.17mm/px · 14 of 86 slices shown]
[im 1/86]
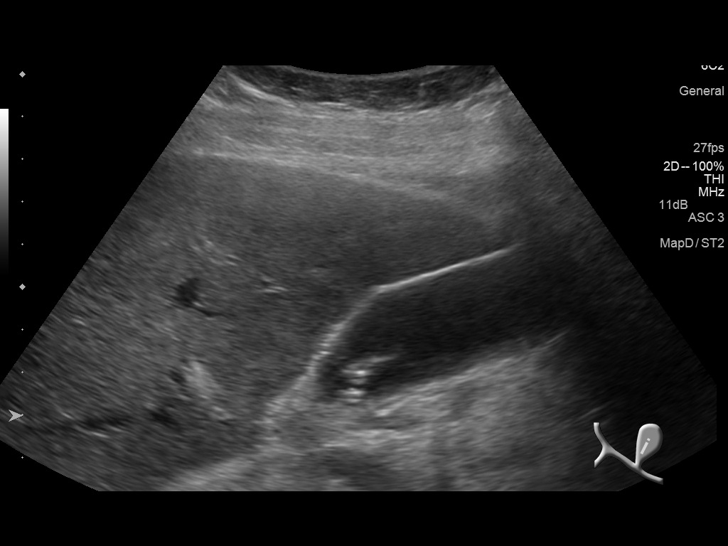
[im 8/86]
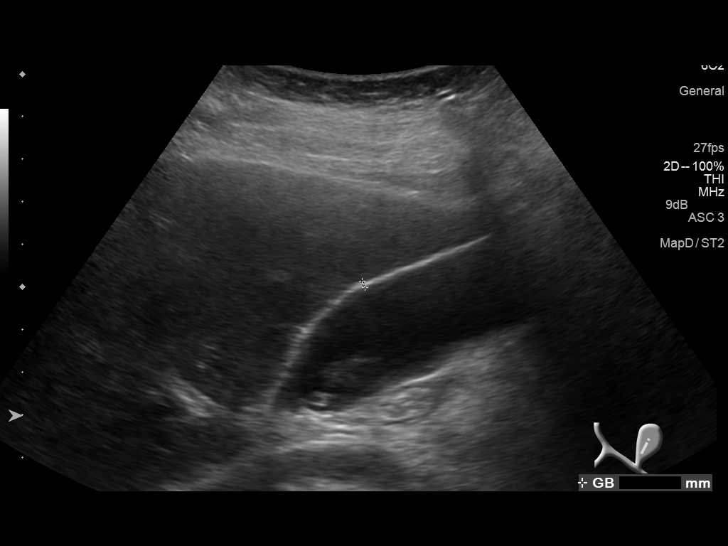
[im 15/86]
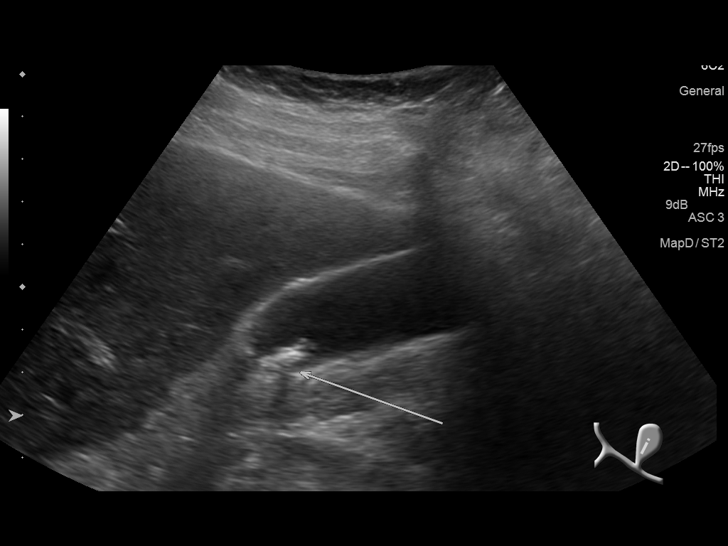
[im 22/86]
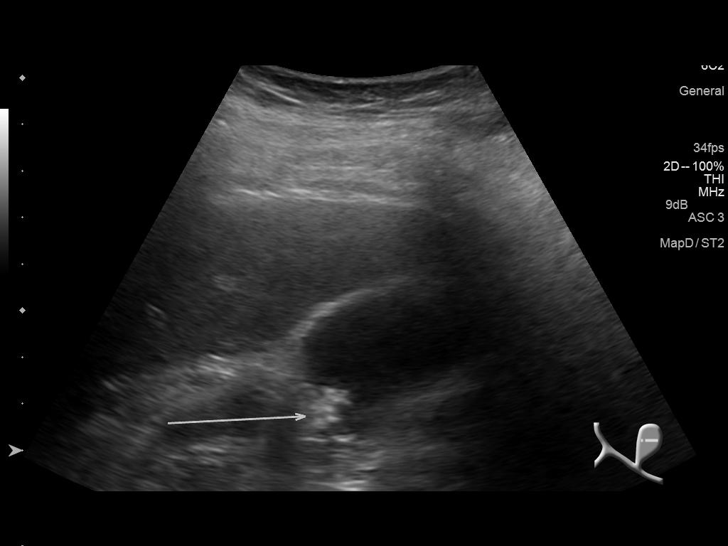
[im 29/86]
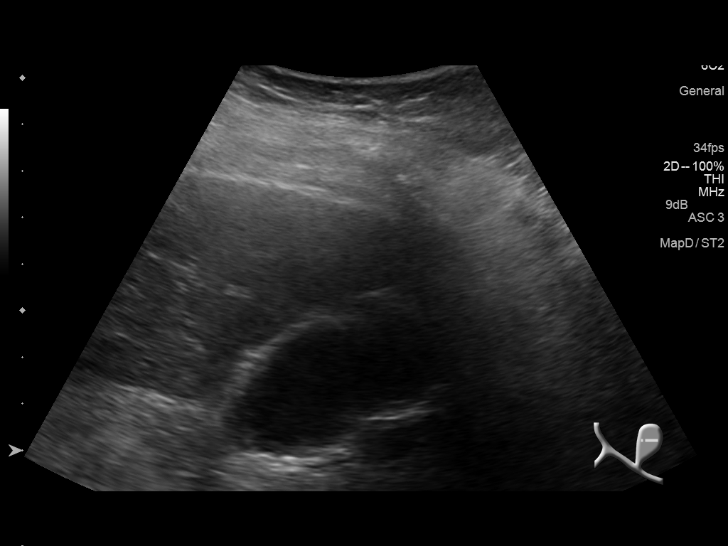
[im 32/86]
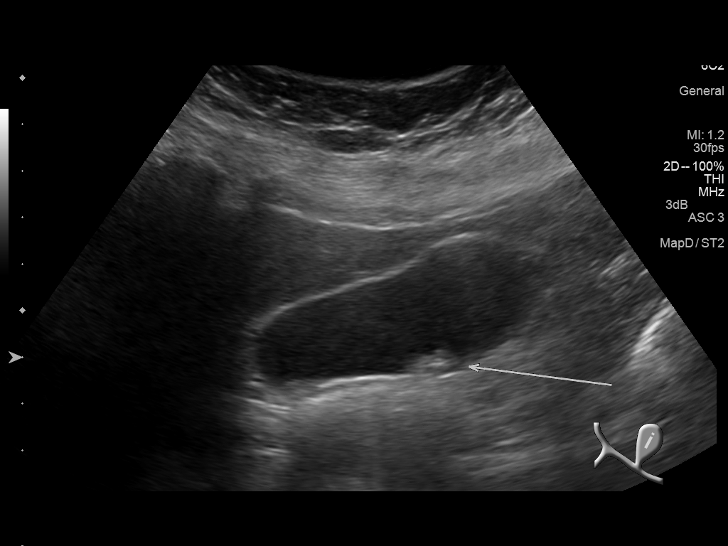
[im 39/86]
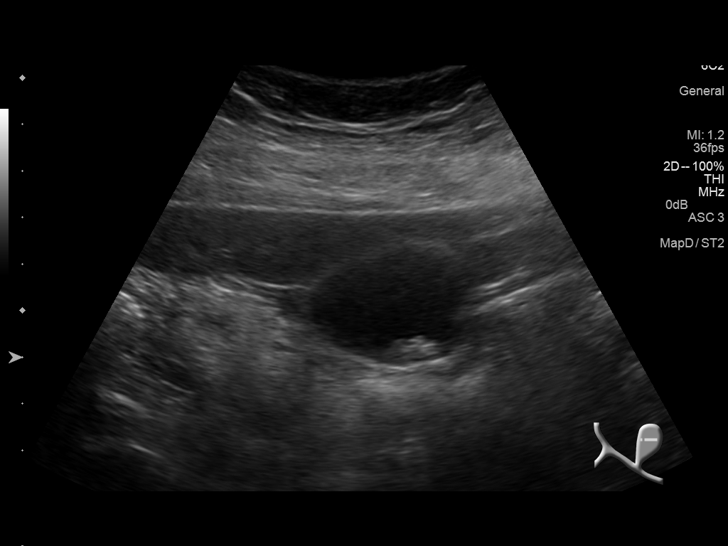
[im 47/86]
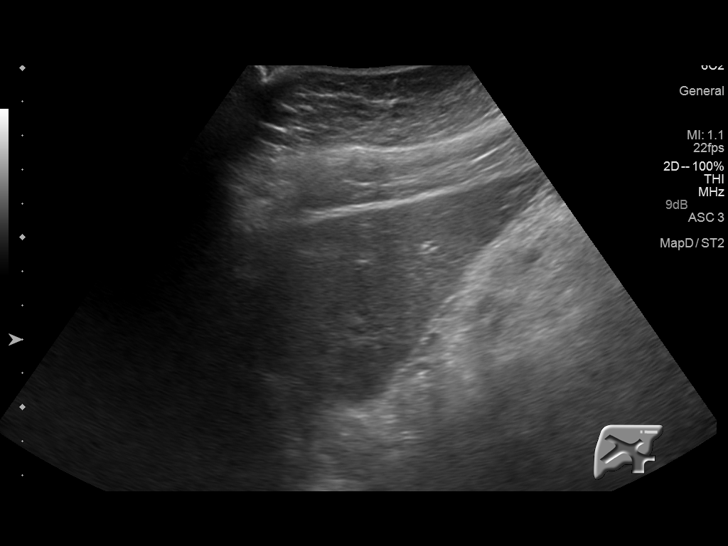
[im 54/86]
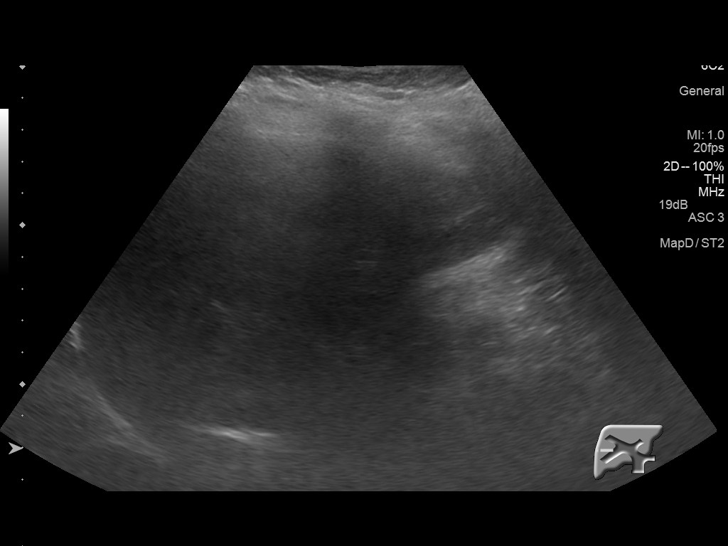
[im 57/86]
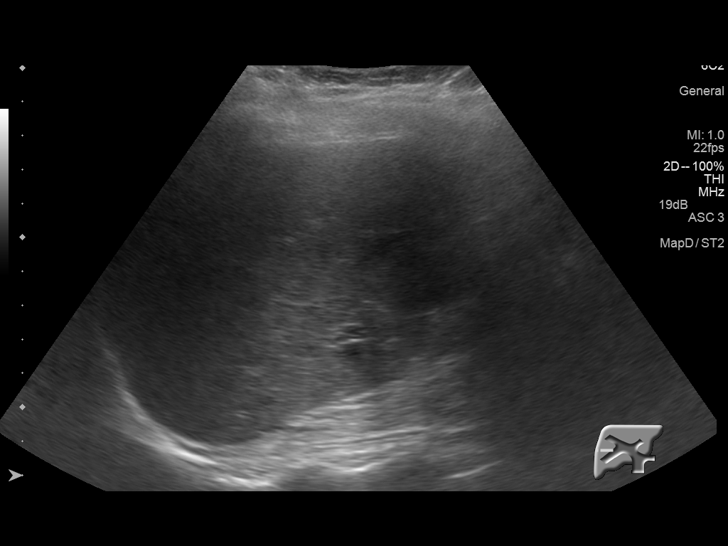
[im 64/86]
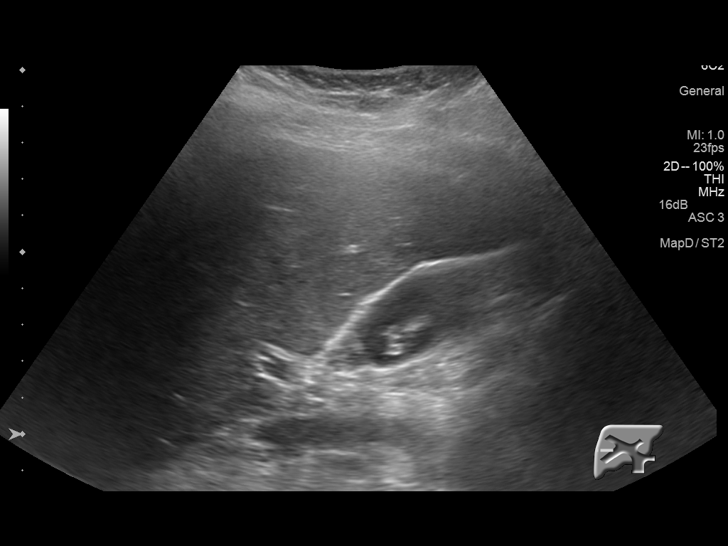
[im 71/86]
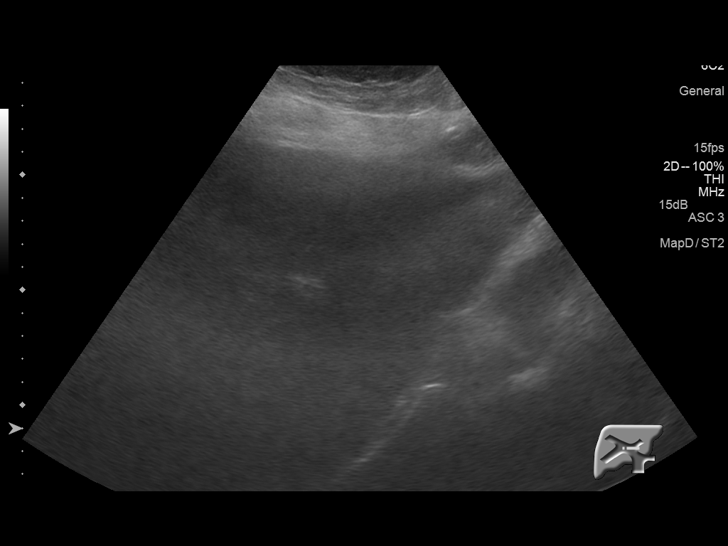
[im 78/86]
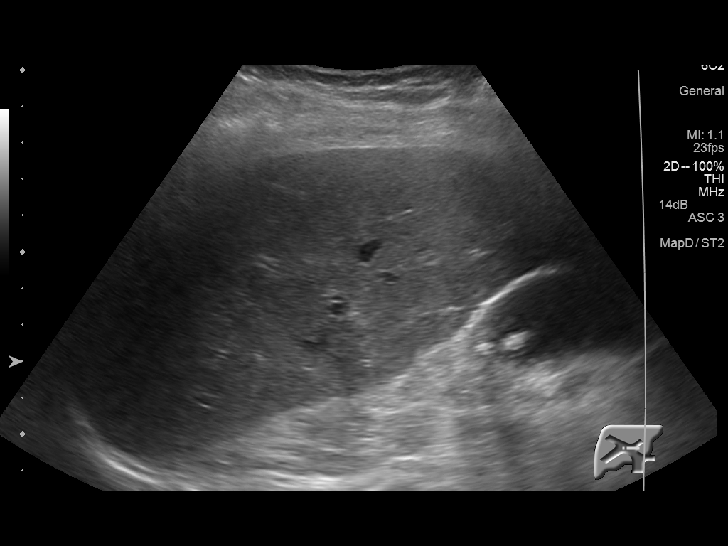
[im 86/86]
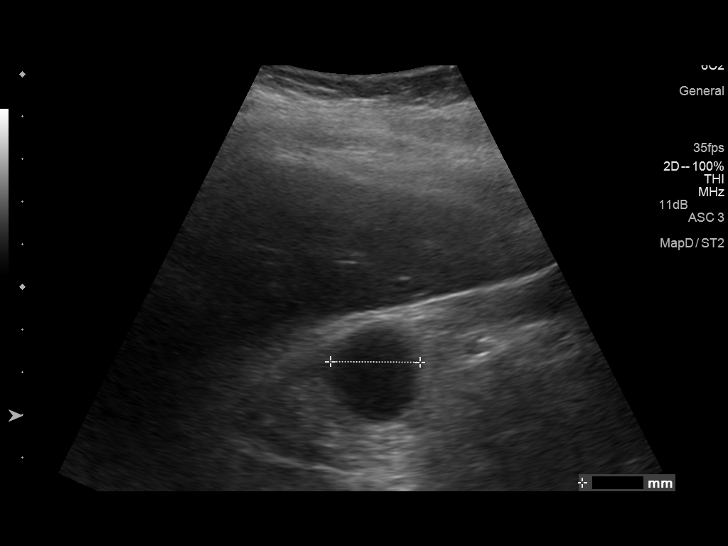

[14 of 25 positions shown; findings below may reference images not displayed]

FINDINGS: Gallbladder:

Multiple small echogenic foci with small amount of shadowing in the
gallbladder, compatible with gallstones and/or biliary sludge balls.
These are mobile in measure up to 1.1 cm. Gallbladder is only
moderately distended, with no gallbladder wall thickening (wall
thickness measures 1.6 mm), or pericholecystic fluid. Per the
sonographer, there was no sonographic Murphy's sign on examination.

Common bile duct:

Diameter: 3.8 mm.

Liver:

No focal lesion identified. Within normal limits in parenchymal
echogenicity.
IMPRESSION: 1. Study positive for cholelithiasis without evidence to suggest
acute cholecystitis at this time.

## 2016-06-12 ENCOUNTER — Ambulatory Visit (HOSPITAL_COMMUNITY): Payer: No Typology Code available for payment source | Admitting: Physical Therapy

## 2016-06-12 DIAGNOSIS — M6281 Muscle weakness (generalized): Secondary | ICD-10-CM

## 2016-06-12 DIAGNOSIS — M542 Cervicalgia: Secondary | ICD-10-CM | POA: Diagnosis not present

## 2016-06-12 DIAGNOSIS — M5412 Radiculopathy, cervical region: Secondary | ICD-10-CM | POA: Diagnosis not present

## 2016-06-12 NOTE — Patient Instructions (Signed)
   UPPER TRAP STRETCH  Sitting up nice and tall with good posture, let gravity help you to sink your left ear to your left shoulder. You should feel a stretch on your right shoulder/neck.  Hold for 30 seconds, then relax.  Repeat 2 times each side, 1-2x/day.    Levator Scap Stretch   Sitting upright, tilt your head downward and towards your armpit.   Hold for 20-30 seconds once you feel a gentle stretch. Perform 1-2 times each side, twice a day.

## 2016-06-12 NOTE — Therapy (Signed)
[place], [place], [address] Phone: [phone]   Fax:  [phone]  Physical Therapy Treatment (Re-Assessment)  Patient Details  Name: [name] MRN: [id number] Date of Birth: 08-Jul-1945 Referring Provider: Melina Schools   Encounter Date: 06/12/2016      PT End of Session - 06/12/16 1515    Visit Number 11   Number of Visits 17   Date for PT Re-Evaluation 07/03/16   Authorization Type Medicare Part A and B    Authorization Time Period 10/07/56 - 8/50/27; recert done 7/41   Authorization - Visit Number 11   Authorization - Number of Visits 20   PT Start Time 2878   PT Stop Time 1515   PT Time Calculation (min) 47 min   Activity Tolerance Patient tolerated treatment well   Behavior During Therapy [facility] for tasks assessed/performed      Past Medical History:  Diagnosis Date  . Arthritis   . Diabetes mellitus    non insulin  . History of blood clots   . History of gout    right ring finger  . Homocysteinemia ([place]) 11/29/2010  . Hypertension   . Kidney atrophy    rt.  . Left knee pain   . Multinodular goiter     Past Surgical History:  Procedure Laterality Date  . ABDOMINAL HYSTERECTOMY    . ESOPHAGOGASTRODUODENOSCOPY N/A 07/02/2012   Procedure: ESOPHAGOGASTRODUODENOSCOPY (EGD);  Surgeon: Rogene Houston, MD;  [facility];  Service: Endoscopy;  Laterality: N/A;  . ESOPHAGOGASTRODUODENOSCOPY N/A 09/13/2014   Procedure: ESOPHAGOGASTRODUODENOSCOPY (EGD);  Surgeon: Danie Binder, MD;  [facility];  Service: Endoscopy;  Laterality: N/A;  . hypertension    . KNEE SURGERY     rt. arthroscopic  . NECK SURGERY    . TONSILLECTOMY    . type ll diabetes      There were no vitals filed for this visit.      Subjective Assessment - 06/12/16 1429    Subjective Patient arrives stating that she feels she has made progress, she is a lot better than she was when she first started but  feels like she has a way to go. She is most concerned with the pain and soreness in her neck especially on the L side and the middle. She is not sure what is making it worse but has good days and bad days with her pain; heat helps, as does the massages she has been getting. She has tried sleeping her sleeping habits and pillows but has not noticed a big change. There is always a nagging pain. She was very sore after last session. She grades herself as being a "B-" right now as compared to where she would like to be.     Pertinent History DVT, DM, tobacco abuse, gout, cervical fusion, OA    How long can you sit comfortably? 3/13- varies    How long can you stand comfortably? 3/13- unable    How long can you walk comfortably? 3/13- depends on how far she is walking    Diagnostic tests images from 11/17 reveal intact cervical fusion site as well as stable anterior cervical fractures    Patient Stated Goals "get healthy"    Currently in Pain? Yes   Pain Score 3    Pain Location Neck   Pain Orientation Left   Pain Descriptors / Indicators Dull;Nagging;Constant   Pain Type Chronic pain   Pain Radiating  Towards she does have radicular symptoms with some exercises (not sure which, she reports this is random) or when she is in a certain position    Pain Onset More than a month ago   Pain Frequency Constant   Aggravating Factors  unsure    Pain Relieving Factors heat    Effect of Pain on Daily Activities cannot lift anything heavy like pots or pans             [facility] PT Assessment - 06/12/16 0001      Assessment   Medical Diagnosis s/p fracture of cervical vertebrae    Referring Provider Melina Schools    Next MD Visit Dr. Rolena Infante later this month    Prior Therapy none      Balance Screen   Has the patient fallen in the past 6 months No   Has the patient had a decrease in activity level because of a fear of falling?  No   Is the patient reluctant to leave their home because of a fear of  falling?  No     Prior Function   Level of Independence Independent;Independent with basic ADLs;Independent with gait;Independent with transfers   Vocation Retired     AROM   Right Shoulder Flexion 120 Degrees   Right Shoulder ABduction 120 Degrees  severe compensation    Right Shoulder Internal Rotation --  L4   Right Shoulder External Rotation --  T2   Left Shoulder Flexion --  WFL    Left Shoulder ABduction --  [facility] but compensations present    Left Shoulder Internal Rotation --  T10   Left Shoulder External Rotation --  T3   Cervical Flexion 36   Cervical Extension 24   Cervical - Right Side Bend 22   Cervical - Left Side Bend 23   Cervical - Right Rotation 54   Cervical - Left Rotation 60   Thoracic Flexion WFL    Thoracic Extension min limitation    Thoracic - Right Side Bend [facility]    Thoracic - Left Side Bend [facility]    Thoracic - Right Rotation min limitation    Thoracic - Left Rotation min limitation      Palpation   Palpation comment ongoing significant tightness noted, however this has improved since eval especially in area of cervical extensors                      [facility] Adult PT Treatment/Exercise - 06/12/16 0001      Neck Exercises: Supine   Neck Retraction 15 reps;5 secs   Neck Retraction Limitations in supine    Other Supine Exercise chin tuck with cervical rotation 1x10 each way, 1 second holds      Neck Exercises: Stretches   Upper Trapezius Stretch 2 reps;30 seconds   Levator Stretch 2 reps;20 seconds                PT Education - 06/12/16 1515    Education provided Yes   Education Details progress with PT, POC moving forward; massage therapist; HEP updates    Person(s) Educated Patient   Methods Explanation;Demonstration;Handout   Comprehension Verbalized understanding;Returned demonstration;Need further instruction          PT Short Term Goals - 06/12/16 1447      PT SHORT TERM GOAL #1   Title Pt will be able to  perform HEP independently in order to maximize AROM and improve overall function.   Baseline 3/13- "  I try to do something every day, not necessarily every single thing"    Time 3   Period Weeks   Status Partially Met     PT SHORT TERM GOAL #2   Title Pt will have improved cervical AROM in all directions by 10 degrees or more with minimal to no pain to demonstrate improved function and maximize her QoL.   Baseline 3/13: rotation has improved significant, flexion/extension/lateral flexion have improved but remain stiff    Time 3   Period Weeks   Status Partially Met     PT SHORT TERM GOAL #3   Title Pt will have improved R GH flex and abd AROM by 10 deg or more with minimal to no pain to maximize function at home and demonstrate improved overall function.   Time 3   Period Weeks   Status Achieved           PT Long Term Goals - 06/12/16 1449      PT LONG TERM GOAL #1   Title Pt will have improved cervical AROM to [facility] with no pain to maximize function and home and with driving.   Time 6   Period Weeks   Status On-going     PT LONG TERM GOAL #2   Title Pt will have improved R GH AROM to [facility] with no pain to maximize function at home and improve overall QoL.   Baseline 3/13- L has improved greatly, having a hard day with R shoudler however    Time 6   Period Weeks   Status Partially Met     PT LONG TERM GOAL #3   Title Pt will be able to drive and perform all household duties for 1 hour or > to demonstrate improved overall function and maximize QoL.   Baseline 3/13- patient reports this is going well, she has started driving in [place]; she can do some household duties but has to rest before an hour is up    Time 6   Period Weeks   Status On-going               Plan - 06/12/16 1516    Clinical Impression Statement Re-assessment performed today. Patient does appear to be making some progress with skilled PT services however continues to demonstrate ongoing cervical  stiffness, muscle spasm, postural limitations, and reduced ability to tolerate functional tasks due to ongoing neck pain. Suspect that some of patient's symptoms/ongoing spasm may be related to muscle weakness and impaired flexibility at this time. Recommend that PT continue to focus on increasing functional strength and flexibility with reduce focus on manual at this time; also discussed local massage therapists with patient for ongoing intensive soft tissue work to her cervical area. Recommend continuation of skilled PT services in order to address remaining functional limitations and to assist in reaching optimal level of function.    Rehab Potential Fair   Clinical Impairments Affecting Rehab Potential time since injury, PMH   PT Frequency 2x / week   PT Duration 3 weeks   PT Treatment/Interventions ADLs/Self Care Home Management;Biofeedback;Cryotherapy;Electrical Stimulation;Moist Heat;Functional mobility training;Therapeutic activities;Therapeutic exercise;Neuromuscular re-education;Patient/family education;Manual techniques;Passive range of motion;Dry needling;Taping   PT Next Visit Plan continue strengthening and AROM in supine especially L rotation; fucntional stretches. STM but reduce how much we are focusing on this.    PT Home Exercise Plan Eval- cervical isometrics (flex, ext, bil lat flex), gentle AROM in all directions; 3/13: trap and levator stretches    Consulted  and Agree with Plan of Care Patient      Patient will benefit from skilled therapeutic intervention in order to improve the following deficits and impairments:  Decreased activity tolerance, Decreased range of motion, Decreased strength, Hypomobility, Impaired sensation, Increased muscle spasms, Impaired UE functional use, Postural dysfunction, Pain  Visit Diagnosis: Cervicalgia - Plan: PT plan of care cert/re-cert  Muscle weakness (generalized) - Plan: PT plan of care cert/re-cert  Radiculopathy, cervical region - Plan:  PT plan of care cert/re-cert     Problem List Patient Active Problem List   Diagnosis Date Noted  . Multinodular goiter   . Bilateral carotid bruits 11/08/2015  . Obesity (BMI 30.0-34.9) 11/08/2015  . Hyperlipidemia 11/08/2015  . Gastritis and gastroduodenitis   . Nausea with vomiting   . Exertional chest pain 08/13/2014  . Encounter for annual physical exam 05/12/2014  . Hypokalemia 07/03/2012  . Abdominal pain 07/01/2012  . Dehydration 07/01/2012  . Right kidney mass 07/01/2012  . Tobacco abuse 07/01/2012  . DM type 2 (diabetes mellitus, type 2) ([place]) 07/01/2012  . Chronic back pain 07/01/2012  . Abnormal gall bladder diagnostic imaging 07/01/2012  . Homocysteinemia ([place]) 11/29/2010  . Deep vein thrombosis (DVT) ([place]) 11/13/2010  . ARTHRITIS, LEFT KNEE 04/21/2009  . Essential hypertension 04/21/2009    Deniece Ree PT, DPT [place] [address] [place], [place], [address] Phone: [phone]   Fax:  [phone]  Name: [name] MRN: [id number] Date of Birth: Sep 07, 1945

## 2016-06-14 ENCOUNTER — Ambulatory Visit (HOSPITAL_COMMUNITY): Payer: No Typology Code available for payment source | Admitting: Physical Therapy

## 2016-06-14 DIAGNOSIS — M6281 Muscle weakness (generalized): Secondary | ICD-10-CM | POA: Diagnosis not present

## 2016-06-14 DIAGNOSIS — M542 Cervicalgia: Secondary | ICD-10-CM

## 2016-06-14 DIAGNOSIS — M5412 Radiculopathy, cervical region: Secondary | ICD-10-CM | POA: Diagnosis not present

## 2016-06-14 NOTE — Therapy (Signed)
[place], [place], [address] Phone: [phone]   Fax:  [phone]  Physical Therapy Treatment  Patient Details  Name: [name] MRN: [id number] Date of Birth: Sep 03, 1945 Referring Provider: Melina Schools   Encounter Date: 06/14/2016      PT End of Session - 06/14/16 1513    Visit Number 12   Number of Visits 17   Date for PT Re-Evaluation 07/03/16   Authorization Type Medicare Part A and B    Authorization - Visit Number 12   Authorization - Number of Visits 20   PT Start Time 9735  moist heat, not included in billing    PT Stop Time 1512   PT Time Calculation (min) 27 min   Activity Tolerance Patient tolerated treatment well   Behavior During Therapy [facility] for tasks assessed/performed      Past Medical History:  Diagnosis Date  . Arthritis   . Diabetes mellitus    non insulin  . History of blood clots   . History of gout    right ring finger  . Homocysteinemia ([place]) 11/29/2010  . Hypertension   . Kidney atrophy    rt.  . Left knee pain   . Multinodular goiter     Past Surgical History:  Procedure Laterality Date  . ABDOMINAL HYSTERECTOMY    . ESOPHAGOGASTRODUODENOSCOPY N/A 07/02/2012   Procedure: ESOPHAGOGASTRODUODENOSCOPY (EGD);  Surgeon: Rogene Houston, MD;  [facility];  Service: Endoscopy;  Laterality: N/A;  . ESOPHAGOGASTRODUODENOSCOPY N/A 09/13/2014   Procedure: ESOPHAGOGASTRODUODENOSCOPY (EGD);  Surgeon: Danie Binder, MD;  [facility];  Service: Endoscopy;  Laterality: N/A;  . hypertension    . KNEE SURGERY     rt. arthroscopic  . NECK SURGERY    . TONSILLECTOMY    . type ll diabetes      There were no vitals filed for this visit.      Subjective Assessment - 06/14/16 1435    Subjective patient arrives stating that last time she was very sore afterwards, like she got a good workout. She feels about the same otherwise with occasional shooting and  numbness in R UE.    Pertinent History DVT, DM, tobacco abuse, gout, cervical fusion, OA    Patient Stated Goals "get healthy"    Currently in Pain? Yes   Pain Score 3    Pain Location Neck   Pain Orientation Left                         [facility] Adult PT Treatment/Exercise - 06/14/16 0001      Neck Exercises: Seated   Cervical Rotation Both;10 reps   Shoulder Rolls 15 reps;Backwards   Other Seated Exercise thoracic rotations 1x3 each side      Neck Exercises: Supine   Neck Retraction 15 reps;5 secs   Neck Retraction Limitations in supine    Other Supine Exercise chin tuck with cervical rotation 1x15 each way, 1 second holds    Other Supine Exercise scapular retractons in supine 1x20, 2 second holds      Moist Heat Therapy   Number Minutes Moist Heat 10 Minutes   Moist Heat Location Cervical;Other (comment)  not included in billing, prior to ther ex      Neck Exercises: Stretches   Upper Trapezius Stretch 2 reps;30 seconds   Levator Stretch 2 reps;30 seconds  PT Education - 06/14/16 1513    Education provided Yes   Education Details possible DOMS    Person(s) Educated Patient   Methods Explanation   Comprehension Verbalized understanding          PT Short Term Goals - 06/12/16 1447      PT SHORT TERM GOAL #1   Title Pt will be able to perform HEP independently in order to maximize AROM and improve overall function.   Baseline 3/13- "I try to do something every day, not necessarily every single thing"    Time 3   Period Weeks   Status Partially Met     PT SHORT TERM GOAL #2   Title Pt will have improved cervical AROM in all directions by 10 degrees or more with minimal to no pain to demonstrate improved function and maximize her QoL.   Baseline 3/13: rotation has improved significant, flexion/extension/lateral flexion have improved but remain stiff    Time 3   Period Weeks   Status Partially Met     PT SHORT TERM GOAL #3    Title Pt will have improved R GH flex and abd AROM by 10 deg or more with minimal to no pain to maximize function at home and demonstrate improved overall function.   Time 3   Period Weeks   Status Achieved           PT Long Term Goals - 06/12/16 1449      PT LONG TERM GOAL #1   Title Pt will have improved cervical AROM to [facility] with no pain to maximize function and home and with driving.   Time 6   Period Weeks   Status On-going     PT LONG TERM GOAL #2   Title Pt will have improved R GH AROM to [facility] with no pain to maximize function at home and improve overall QoL.   Baseline 3/13- L has improved greatly, having a hard day with R shoudler however    Time 6   Period Weeks   Status Partially Met     PT LONG TERM GOAL #3   Title Pt will be able to drive and perform all household duties for 1 hour or > to demonstrate improved overall function and maximize QoL.   Baseline 3/13- patient reports this is going well, she has started driving in [place]; she can do some household duties but has to rest before an hour is up    Time 6   Period Weeks   Status On-going               Plan - 06/14/16 1514    Clinical Impression Statement Began session with moist heat, not included in billing. Otherwise continued with general functional stretching and strengthening of cervical and postural musculature, which patient continues to find quite challenging per her report. She continues to display significant muscle stiffness in traps and levators/scalenes as well as significant postural muscle weakness at this time.    Rehab Potential Fair   Clinical Impairments Affecting Rehab Potential time since injury, PMH   PT Frequency 2x / week   PT Duration 3 weeks   PT Treatment/Interventions ADLs/Self Care Home Management;Biofeedback;Cryotherapy;Electrical Stimulation;Moist Heat;Functional mobility training;Therapeutic activities;Therapeutic exercise;Neuromuscular re-education;Patient/family  education;Manual techniques;Passive range of motion;Dry needling;Taping   PT Next Visit Plan begin with moist heat as this makes ther ex more tolerable. Continue working on strengthand functional stretches. reduced focus on STM   PT Home Exercise Plan Eval- cervical isometrics (  flex, ext, bil lat flex), gentle AROM in all directions; 3/13: trap and levator stretches    Consulted and Agree with Plan of Care Patient      Patient will benefit from skilled therapeutic intervention in order to improve the following deficits and impairments:  Decreased activity tolerance, Decreased range of motion, Decreased strength, Hypomobility, Impaired sensation, Increased muscle spasms, Impaired UE functional use, Postural dysfunction, Pain  Visit Diagnosis: Cervicalgia  Muscle weakness (generalized)  Radiculopathy, cervical region     Problem List Patient Active Problem List   Diagnosis Date Noted  . Multinodular goiter   . Bilateral carotid bruits 11/08/2015  . Obesity (BMI 30.0-34.9) 11/08/2015  . Hyperlipidemia 11/08/2015  . Gastritis and gastroduodenitis   . Nausea with vomiting   . Exertional chest pain 08/13/2014  . Encounter for annual physical exam 05/12/2014  . Hypokalemia 07/03/2012  . Abdominal pain 07/01/2012  . Dehydration 07/01/2012  . Right kidney mass 07/01/2012  . Tobacco abuse 07/01/2012  . DM type 2 (diabetes mellitus, type 2) ([place]) 07/01/2012  . Chronic back pain 07/01/2012  . Abnormal gall bladder diagnostic imaging 07/01/2012  . Homocysteinemia ([place]) 11/29/2010  . Deep vein thrombosis (DVT) ([place]) 11/13/2010  . ARTHRITIS, LEFT KNEE 04/21/2009  . Essential hypertension 04/21/2009    Deniece Ree PT, DPT [place] [address] [place], [place], [address] Phone: [phone]   Fax:  [phone]  Name: JAMAICA INTHAVONG MRN: [id number] Date of Birth: Nov 16, 1945

## 2016-06-18 DIAGNOSIS — S12190D Other displaced fracture of second cervical vertebra, subsequent encounter for fracture with routine healing: Secondary | ICD-10-CM | POA: Diagnosis not present

## 2016-06-18 DIAGNOSIS — G5621 Lesion of ulnar nerve, right upper limb: Secondary | ICD-10-CM | POA: Diagnosis not present

## 2016-06-19 ENCOUNTER — Ambulatory Visit (HOSPITAL_COMMUNITY): Payer: No Typology Code available for payment source

## 2016-06-19 DIAGNOSIS — M542 Cervicalgia: Secondary | ICD-10-CM

## 2016-06-19 DIAGNOSIS — M6281 Muscle weakness (generalized): Secondary | ICD-10-CM

## 2016-06-19 DIAGNOSIS — M5412 Radiculopathy, cervical region: Secondary | ICD-10-CM

## 2016-06-19 NOTE — Therapy (Signed)
[place], [place], [address] Phone: [phone]   Fax:  [phone]  Physical Therapy Treatment  Patient Details  Name: [name] MRN: [id number] Date of Birth: 04-07-1945 Referring Provider: Melina Schools   Encounter Date: 06/19/2016      PT End of Session - 06/19/16 1741    Visit Number 13   Number of Visits 17   Date for PT Re-Evaluation 07/03/16   Authorization Type Medicare Part A and B    Authorization Time Period 04/08/77 - 3/90/30; recert done 0/92   Authorization - Visit Number 13   Authorization - Number of Visits 20   PT Start Time 3300  Began with [facility] until 7622 during subjective and education on TENS unit.   PT Stop Time 1812   PT Time Calculation (min) 45 min   Activity Tolerance Patient tolerated treatment well;No increased pain   Behavior During Therapy WFL for tasks assessed/performed      Past Medical History:  Diagnosis Date  . Arthritis   . Diabetes mellitus    non insulin  . History of blood clots   . History of gout    right ring finger  . Homocysteinemia ([place]) 11/29/2010  . Hypertension   . Kidney atrophy    rt.  . Left knee pain   . Multinodular goiter     Past Surgical History:  Procedure Laterality Date  . ABDOMINAL HYSTERECTOMY    . ESOPHAGOGASTRODUODENOSCOPY N/A 07/02/2012   Procedure: ESOPHAGOGASTRODUODENOSCOPY (EGD);  Surgeon: Rogene Houston, MD;  [facility];  Service: Endoscopy;  Laterality: N/A;  . ESOPHAGOGASTRODUODENOSCOPY N/A 09/13/2014   Procedure: ESOPHAGOGASTRODUODENOSCOPY (EGD);  Surgeon: Danie Binder, MD;  [facility];  Service: Endoscopy;  Laterality: N/A;  . hypertension    . KNEE SURGERY     rt. arthroscopic  . NECK SURGERY    . TONSILLECTOMY    . type ll diabetes      There were no vitals filed for this visit.      Subjective Assessment - 06/19/16 1737    Subjective Pt went to MD yesterday, brought referral to  continue PT treatment with additional TENS unit/instruction.  Pt feels she is making good progress with therapy, continues to have some deficits with pulling to Lt side of neck and some on back of neck.   Pertinent History DVT, DM, tobacco abuse, gout, cervical fusion, OA    Patient Stated Goals "get healthy"    Currently in Pain? Yes   Pain Score 3    Pain Location Neck   Pain Orientation Left;Posterior   Pain Descriptors / Indicators Sore;Aching   Pain Type Chronic pain   Pain Radiating Towards c/o radicular symptoms down Rt UE to palm randomly   Pain Onset More than a month ago   Pain Frequency Constant   Aggravating Factors  unsure   Pain Relieving Factors heat   Effect of Pain on Daily Activities cannot lift anything heavy like pots or pans                         [facility] Adult PT Treatment/Exercise - 06/19/16 0001      Neck Exercises: Seated   Neck Retraction 10 reps   Neck Retraction Limitations multimodal cueing   Cervical Rotation Both;10 reps   X to V 10 reps   W Back Limitations 10x   Shoulder Rolls Backwards;15 reps  Other Seated Exercise scapular retraction 10x     Neck Exercises: Supine   Neck Retraction 15 reps;5 secs   Neck Retraction Limitations in supine      Manual Therapy   Manual Therapy Soft tissue mobilization;Passive ROM   Manual therapy comments Manual complete separate rest of tx   Soft tissue mobilization Upper trap, scalenes, levator scapula   Myofascial Release sub occipital release Lt side only    Passive ROM Cervical ROM     Neck Exercises: Stretches   Upper Trapezius Stretch 2 reps;30 seconds   Other Neck Stretches gentle cervical rotation stretch with towel 2x10 sec each side                PT Education - 06/19/16 1745    Education provided Yes   Education Details Educated on TENS unit descripion, purpose and benefits   Person(s) Educated Patient   Methods Explanation   Comprehension Verbalized  understanding;Need further instruction          PT Short Term Goals - 06/12/16 1447      PT SHORT TERM GOAL #1   Title Pt will be able to perform HEP independently in order to maximize AROM and improve overall function.   Baseline 3/13- "I try to do something every day, not necessarily every single thing"    Time 3   Period Weeks   Status Partially Met     PT SHORT TERM GOAL #2   Title Pt will have improved cervical AROM in all directions by 10 degrees or more with minimal to no pain to demonstrate improved function and maximize her QoL.   Baseline 3/13: rotation has improved significant, flexion/extension/lateral flexion have improved but remain stiff    Time 3   Period Weeks   Status Partially Met     PT SHORT TERM GOAL #3   Title Pt will have improved R GH flex and abd AROM by 10 deg or more with minimal to no pain to maximize function at home and demonstrate improved overall function.   Time 3   Period Weeks   Status Achieved           PT Long Term Goals - 06/12/16 1449      PT LONG TERM GOAL #1   Title Pt will have improved cervical AROM to [facility] with no pain to maximize function and home and with driving.   Time 6   Period Weeks   Status On-going     PT LONG TERM GOAL #2   Title Pt will have improved R GH AROM to [facility] with no pain to maximize function at home and improve overall QoL.   Baseline 3/13- L has improved greatly, having a hard day with R shoudler however    Time 6   Period Weeks   Status Partially Met     PT LONG TERM GOAL #3   Title Pt will be able to drive and perform all household duties for 1 hour or > to demonstrate improved overall function and maximize QoL.   Baseline 3/13- patient reports this is going well, she has started driving in [place]; she can do some household duties but has to rest before an hour is up    Time 6   Period Weeks   Status On-going               Plan - 06/19/16 1755    Clinical Impression Statement Pt  entered session with referral to continue treatment  and TENS unit.  Pt educated on device details, purpose and benefits with device.  Evaluation PT aware of new referral and plans to call MD tomorrow to get specifics of referral (home vs in clinic).  Continued session focus on cervical mobility and progressed posture strengthening with UE movements.  Pt continues to require cueing for appropriate form with seated cervical retraction, improved form/technique with supine position.  EOS with manual soft tissue mobilization techqniues to address significant muscle tightness in traps and levators/scalenes.  Following manual pt reports decreased overall pain, improved mobilty and decreased Rt UE radicular symptoms.     Rehab Potential Fair   Clinical Impairments Affecting Rehab Potential time since injury, PMH   PT Frequency 2x / week   PT Duration 3 weeks   PT Treatment/Interventions ADLs/Self Care Home Management;Biofeedback;Cryotherapy;Electrical Stimulation;Moist Heat;Functional mobility training;Therapeutic activities;Therapeutic exercise;Neuromuscular re-education;Patient/family education;Manual techniques;Passive range of motion;Dry needling;Taping   PT Next Visit Plan begin with moist heat as this makes ther ex more tolerable. Continue working on strengthand functional stretches. reduced focus on STM   PT Home Exercise Plan Eval- cervical isometrics (flex, ext, bil lat flex), gentle AROM in all directions; 3/13: trap and levator stretches       Patient will benefit from skilled therapeutic intervention in order to improve the following deficits and impairments:  Decreased activity tolerance, Decreased range of motion, Decreased strength, Hypomobility, Impaired sensation, Increased muscle spasms, Impaired UE functional use, Postural dysfunction, Pain  Visit Diagnosis: Cervicalgia  Muscle weakness (generalized)  Radiculopathy, cervical region     Problem List Patient Active Problem List    Diagnosis Date Noted  . Multinodular goiter   . Bilateral carotid bruits 11/08/2015  . Obesity (BMI 30.0-34.9) 11/08/2015  . Hyperlipidemia 11/08/2015  . Gastritis and gastroduodenitis   . Nausea with vomiting   . Exertional chest pain 08/13/2014  . Encounter for annual physical exam 05/12/2014  . Hypokalemia 07/03/2012  . Abdominal pain 07/01/2012  . Dehydration 07/01/2012  . Right kidney mass 07/01/2012  . Tobacco abuse 07/01/2012  . DM type 2 (diabetes mellitus, type 2) ([place]) 07/01/2012  . Chronic back pain 07/01/2012  . Abnormal gall bladder diagnostic imaging 07/01/2012  . Homocysteinemia ([place]) 11/29/2010  . Deep vein thrombosis (DVT) ([place]) 11/13/2010  . ARTHRITIS, LEFT KNEE 04/21/2009  . Essential hypertension 04/21/2009   Ihor Austin, [place]; [place]  Aldona Lento 06/19/2016, 6:26 PM  [place] [address] [place], [place], [address] Phone: [phone]   Fax:  [phone]  Name: [name] MRN: [id number] Date of Birth: 1946/03/12

## 2016-06-20 ENCOUNTER — Telehealth (HOSPITAL_COMMUNITY): Payer: Self-pay | Admitting: Physical Therapy

## 2016-06-20 NOTE — Telephone Encounter (Signed)
Called Dr. Rolena Infante office ([place]) to clarify TENS order; staff reports that per most recent note he would like [place] to try TENS with her during PT and if she tolerates it well he will consider transition to a personal home unit.   Deniece Ree PT, DPT [phone]

## 2016-06-21 ENCOUNTER — Ambulatory Visit (HOSPITAL_COMMUNITY): Payer: No Typology Code available for payment source

## 2016-06-21 DIAGNOSIS — M6281 Muscle weakness (generalized): Secondary | ICD-10-CM | POA: Diagnosis not present

## 2016-06-21 DIAGNOSIS — M5412 Radiculopathy, cervical region: Secondary | ICD-10-CM | POA: Diagnosis not present

## 2016-06-21 DIAGNOSIS — M542 Cervicalgia: Secondary | ICD-10-CM

## 2016-06-21 NOTE — Therapy (Signed)
[place], [place], [address] Phone: [phone]   Fax:  [phone]  Physical Therapy Treatment  Patient Details  Name: [name] MRN: [id number] Date of Birth: 14-Sep-1945 Referring Provider: Melina Schools   Encounter Date: 06/21/2016      PT End of Session - 06/21/16 1417    Visit Number 14   Number of Visits 17   Date for PT Re-Evaluation 07/03/16   Authorization Type Medicare Part A and B    Authorization Time Period 09/03/82 - 5/36/46; recert done 8/03   Authorization - Visit Number 14   Authorization - Number of Visits 20   PT Start Time 1346   PT Stop Time 2122   PT Time Calculation (min) 39 min   Activity Tolerance Patient tolerated treatment well;No increased pain   Behavior During Therapy WFL for tasks assessed/performed      Past Medical History:  Diagnosis Date  . Arthritis   . Diabetes mellitus    non insulin  . History of blood clots   . History of gout    right ring finger  . Homocysteinemia ([place]) 11/29/2010  . Hypertension   . Kidney atrophy    rt.  . Left knee pain   . Multinodular goiter     Past Surgical History:  Procedure Laterality Date  . ABDOMINAL HYSTERECTOMY    . ESOPHAGOGASTRODUODENOSCOPY N/A 07/02/2012   Procedure: ESOPHAGOGASTRODUODENOSCOPY (EGD);  Surgeon: Rogene Houston, MD;  [facility];  Service: Endoscopy;  Laterality: N/A;  . ESOPHAGOGASTRODUODENOSCOPY N/A 09/13/2014   Procedure: ESOPHAGOGASTRODUODENOSCOPY (EGD);  Surgeon: Danie Binder, MD;  [facility];  Service: Endoscopy;  Laterality: N/A;  . hypertension    . KNEE SURGERY     rt. arthroscopic  . NECK SURGERY    . TONSILLECTOMY    . type ll diabetes      There were no vitals filed for this visit.      Subjective Assessment - 06/21/16 1352    Subjective Pt doing well, still working on HEP at home with fair tolerance. She has additional quesitons aout TENS use today.     Pertinent History DVT, DM, tobacco abuse, gout, cervical fusion, OA    Currently in Pain? Yes   Pain Score 3    Pain Location Neck   Pain Orientation Left                         [facility] Adult PT Treatment/Exercise - 06/21/16 0001      Neck Exercises: Standing   Other Standing Exercises Sit to stand: 2x10, hands free  Seated trunk rotation c blue ball + cervical rotation : 2x10   Other Standing Exercises cervical rotation, chin to acromion: 1x10 bilat  seated capital flexion stretch: 3x30sec     Modalities   Modalities Electrical Stimulation     Electrical Stimulation   Electrical Stimulation Location Left lateral C3/4, Lef tattachemtn of levator scap    Electrical Stimulation Action TENS (sensory)    Electrical Stimulation Parameters Modulating current    Electrical Stimulation Goals Pain  pan control durign exercises and stretching                   PT Short Term Goals - 06/12/16 1447      PT SHORT TERM GOAL #1   Title Pt will be able to perform HEP independently in order to maximize AROM and  improve overall function.   Baseline 3/13- "I try to do something every day, not necessarily every single thing"    Time 3   Period Weeks   Status Partially Met     PT SHORT TERM GOAL #2   Title Pt will have improved cervical AROM in all directions by 10 degrees or more with minimal to no pain to demonstrate improved function and maximize her QoL.   Baseline 3/13: rotation has improved significant, flexion/extension/lateral flexion have improved but remain stiff    Time 3   Period Weeks   Status Partially Met     PT SHORT TERM GOAL #3   Title Pt will have improved R GH flex and abd AROM by 10 deg or more with minimal to no pain to maximize function at home and demonstrate improved overall function.   Time 3   Period Weeks   Status Achieved           PT Long Term Goals - 06/12/16 1449      PT LONG TERM GOAL #1   Title Pt will have improved  cervical AROM to [facility] with no pain to maximize function and home and with driving.   Time 6   Period Weeks   Status On-going     PT LONG TERM GOAL #2   Title Pt will have improved R GH AROM to [facility] with no pain to maximize function at home and improve overall QoL.   Baseline 3/13- L has improved greatly, having a hard day with R shoudler however    Time 6   Period Weeks   Status Partially Met     PT LONG TERM GOAL #3   Title Pt will be able to drive and perform all household duties for 1 hour or > to demonstrate improved overall function and maximize QoL.   Baseline 3/13- patient reports this is going well, she has started driving in [place]; she can do some household duties but has to rest before an hour is up    Time 6   Period Weeks   Status On-going               Plan - 06/21/16 1421    Clinical Impression Statement Pt educated on TENS, how it works, precautions, etc. Continued with progression of AROM in cervical spine and stretching. Now adding more more funcitonal movements, squats and reaching. Pt continues to c/o pulling onleft side. TENS trialed throughout session, pt indifferent to its use today. No RUe shooting pain this session., but warrants r.o of ponator teres syndrom v. pec minor brachial plexopathy. No changes at this time.    Rehab Potential Fair   Clinical Impairments Affecting Rehab Potential time since injury, PMH   PT Frequency 2x / week   PT Duration 3 weeks   PT Treatment/Interventions ADLs/Self Care Home Management;Biofeedback;Cryotherapy;Electrical Stimulation;Moist Heat;Functional mobility training;Therapeutic activities;Therapeutic exercise;Neuromuscular re-education;Patient/family education;Manual techniques;Passive range of motion;Dry needling;Taping   PT Next Visit Plan  Continue working on strength and functional stretches, moving more to AAROM v PROM. Minimal focus on STM at this time. COntinue to progress back to normal high level functional  movement with normalization of cervical componenet.    PT Home Exercise Plan Eval- cervical isometrics (flex, ext, bil lat flex), gentle AROM in all directions; 3/13: trap and levator stretches       Patient will benefit from skilled therapeutic intervention in order to improve the following deficits and impairments:  Decreased activity tolerance, Decreased range of  motion, Decreased strength, Hypomobility, Impaired sensation, Increased muscle spasms, Impaired UE functional use, Postural dysfunction, Pain  Visit Diagnosis: Cervicalgia  Muscle weakness (generalized)  Radiculopathy, cervical region     Problem List Patient Active Problem List   Diagnosis Date Noted  . Multinodular goiter   . Bilateral carotid bruits 11/08/2015  . Obesity (BMI 30.0-34.9) 11/08/2015  . Hyperlipidemia 11/08/2015  . Gastritis and gastroduodenitis   . Nausea with vomiting   . Exertional chest pain 08/13/2014  . Encounter for annual physical exam 05/12/2014  . Hypokalemia 07/03/2012  . Abdominal pain 07/01/2012  . Dehydration 07/01/2012  . Right kidney mass 07/01/2012  . Tobacco abuse 07/01/2012  . DM type 2 (diabetes mellitus, type 2) ([place]) 07/01/2012  . Chronic back pain 07/01/2012  . Abnormal gall bladder diagnostic imaging 07/01/2012  . Homocysteinemia ([place]) 11/29/2010  . Deep vein thrombosis (DVT) ([place]) 11/13/2010  . ARTHRITIS, LEFT KNEE 04/21/2009  . Essential hypertension 04/21/2009    2:33 PM, 06/21/16 Etta Grandchild, PT, DPT Physical Therapist at [place] [phone] (office)     [place] [address] [place], [place], [address] Phone: [phone]   Fax:  [phone]  Name: [name] MRN: [id number] Date of Birth: 1945-06-02

## 2016-06-21 NOTE — Therapy (Deleted)
[place], [place], [address] Phone: [phone]   Fax:  [phone]  Patient Details  Name: [name] MRN: [id number] Date of Birth: [birth year] Referring Provider:  Sharilyn Sites, MD  Encounter Date: 06/21/2016   Etta Grandchild 06/21/2016, 2:33 PM  [place] [address] [place], [place], [address] Phone: [phone]   Fax:  [phone]

## 2016-06-22 DIAGNOSIS — G5621 Lesion of ulnar nerve, right upper limb: Secondary | ICD-10-CM | POA: Diagnosis not present

## 2016-06-26 ENCOUNTER — Ambulatory Visit (HOSPITAL_COMMUNITY): Payer: No Typology Code available for payment source

## 2016-06-26 DIAGNOSIS — M6281 Muscle weakness (generalized): Secondary | ICD-10-CM

## 2016-06-26 DIAGNOSIS — M5412 Radiculopathy, cervical region: Secondary | ICD-10-CM

## 2016-06-26 DIAGNOSIS — M542 Cervicalgia: Secondary | ICD-10-CM

## 2016-06-26 NOTE — Therapy (Signed)
[place], [place], [address] Phone: [phone]   Fax:  [phone]  Physical Therapy Treatment  Patient Details  Name: [name] MRN: [id number] Date of Birth: September 11, 1945 Referring Provider: Melina Schools   Encounter Date: 06/26/2016      PT End of Session - 06/26/16 1439    Visit Number 15   Number of Visits 17   Date for PT Re-Evaluation 07/03/16   Authorization Type Medicare Part A and B    Authorization Time Period 1/0/21 - 04/18/33; recert done 6/70   Authorization - Visit Number 15   Authorization - Number of Visits 20   PT Start Time 1410   PT Stop Time 1512   PT Time Calculation (min) 38 min   Activity Tolerance Patient tolerated treatment well;No increased pain   Behavior During Therapy WFL for tasks assessed/performed      Past Medical History:  Diagnosis Date  . Arthritis   . Diabetes mellitus    non insulin  . History of blood clots   . History of gout    right ring finger  . Homocysteinemia ([place]) 11/29/2010  . Hypertension   . Kidney atrophy    rt.  . Left knee pain   . Multinodular goiter     Past Surgical History:  Procedure Laterality Date  . ABDOMINAL HYSTERECTOMY    . ESOPHAGOGASTRODUODENOSCOPY N/A 07/02/2012   Procedure: ESOPHAGOGASTRODUODENOSCOPY (EGD);  Surgeon: Rogene Houston, MD;  [facility];  Service: Endoscopy;  Laterality: N/A;  . ESOPHAGOGASTRODUODENOSCOPY N/A 09/13/2014   Procedure: ESOPHAGOGASTRODUODENOSCOPY (EGD);  Surgeon: Danie Binder, MD;  [facility];  Service: Endoscopy;  Laterality: N/A;  . hypertension    . KNEE SURGERY     rt. arthroscopic  . NECK SURGERY    . TONSILLECTOMY    . type ll diabetes      There were no vitals filed for this visit.      Subjective Assessment - 06/26/16 1437    Subjective Pt stated she noted minimal changes if any with her initial trial of TENS unit last session.  Current pain scale 3/10 on Lt  side of neck with radicular symtpoms down center of spine.    Pertinent History DVT, DM, tobacco abuse, gout, cervical fusion, OA    Patient Stated Goals "get healthy"    Currently in Pain? Yes   Pain Score 3    Pain Location Neck   Pain Orientation Left   Pain Descriptors / Indicators Sore;Aching   Pain Type Chronic pain   Pain Radiating Towards occassional radicular symptoms down Rt UE to finger tips    Pain Onset More than a month ago   Pain Frequency Constant   Aggravating Factors  unsure   Pain Relieving Factors heat   Effect of Pain on Daily Activities cannot lift anything heavy like pots or pans                         [facility] Adult PT Treatment/Exercise - 06/26/16 0001      Neck Exercises: Seated   Neck Retraction 10 reps;3 secs   Neck Retraction Limitations tactile and verbal cueing   Other Seated Exercise Thoracic and cervical rotation with blue weighted ball 2x 10 reps   Other Seated Exercise scapular retraction 10x     Modalities   Modalities Electrical Stimulation     Electrical Stimulation   Electrical Stimulation  Location Lt cervical upper trap and levator scapular   Electrical Stimulation Action TENS IFES for pain control   Electrical Stimulation Parameters TENS IFES intensity range 7.0-->8.4 V   Electrical Stimulation Goals Pain     Neck Exercises: Stretches   Upper Trapezius Stretch 3 reps;30 seconds   Levator Stretch 3 reps;30 seconds   Other Neck Stretches gentle cervical rotation stretch with towel 2x10 sec each side     TENS unit was used this session during therex, no charge for TENS             PT Short Term Goals - 06/12/16 1447      PT SHORT TERM GOAL #1   Title Pt will be able to perform HEP independently in order to maximize AROM and improve overall function.   Baseline 3/13- "I try to do something every day, not necessarily every single thing"    Time 3   Period Weeks   Status Partially Met     PT SHORT TERM GOAL  #2   Title Pt will have improved cervical AROM in all directions by 10 degrees or more with minimal to no pain to demonstrate improved function and maximize her QoL.   Baseline 3/13: rotation has improved significant, flexion/extension/lateral flexion have improved but remain stiff    Time 3   Period Weeks   Status Partially Met     PT SHORT TERM GOAL #3   Title Pt will have improved R GH flex and abd AROM by 10 deg or more with minimal to no pain to maximize function at home and demonstrate improved overall function.   Time 3   Period Weeks   Status Achieved           PT Long Term Goals - 06/12/16 1449      PT LONG TERM GOAL #1   Title Pt will have improved cervical AROM to [facility] with no pain to maximize function and home and with driving.   Time 6   Period Weeks   Status On-going     PT LONG TERM GOAL #2   Title Pt will have improved R GH AROM to [facility] with no pain to maximize function at home and improve overall QoL.   Baseline 3/13- L has improved greatly, having a hard day with R shoudler however    Time 6   Period Weeks   Status Partially Met     PT LONG TERM GOAL #3   Title Pt will be able to drive and perform all household duties for 1 hour or > to demonstrate improved overall function and maximize QoL.   Baseline 3/13- patient reports this is going well, she has started driving in [place]; she can do some household duties but has to rest before an hour is up    Time 6   Period Weeks   Status On-going               Plan - 06/26/16 1448    Clinical Impression Statement Pt reports minimal changes with TENS unit last session.  Continued session focus on improving cervical mobility.  Utilized TENS with interferential settings unit during session as continued trial of interest in purchasing unit for home use.  Pt continues to c/o pulling on left side of neck.  No reports of radicular symptoms through session.  Pt improving form with seated cervical retraction, min  verbal and tactile cueing required to reduce thoracic extension..  EOS pt reports pain decreased somewhat  and has further interest in unit.  Discussed procedure for purchasing TENS unit, pt able to verbalize understanding with all questions answered.     Rehab Potential Fair   Clinical Impairments Affecting Rehab Potential time since injury, PMH   PT Frequency 2x / week   PT Duration 3 weeks   PT Treatment/Interventions ADLs/Self Care Home Management;Biofeedback;Cryotherapy;Electrical Stimulation;Moist Heat;Functional mobility training;Therapeutic activities;Therapeutic exercise;Neuromuscular re-education;Patient/family education;Manual techniques;Passive range of motion;Dry needling;Taping   PT Next Visit Plan F/U with questions/purchase of TENS unit.  Continue working on strength and functional stretches, moving more to AAROM v PROM. Minimal focus on STM at this time. COntinue to progress back to normal high level functional movement with normalization of cervical componenet.    PT Home Exercise Plan Eval- cervical isometrics (flex, ext, bil lat flex), gentle AROM in all directions; 3/13: trap and levator stretches       Patient will benefit from skilled therapeutic intervention in order to improve the following deficits and impairments:  Decreased activity tolerance, Decreased range of motion, Decreased strength, Hypomobility, Impaired sensation, Increased muscle spasms, Impaired UE functional use, Postural dysfunction, Pain  Visit Diagnosis: Cervicalgia  Muscle weakness (generalized)  Radiculopathy, cervical region     Problem List Patient Active Problem List   Diagnosis Date Noted  . Multinodular goiter   . Bilateral carotid bruits 11/08/2015  . Obesity (BMI 30.0-34.9) 11/08/2015  . Hyperlipidemia 11/08/2015  . Gastritis and gastroduodenitis   . Nausea with vomiting   . Exertional chest pain 08/13/2014  . Encounter for annual physical exam 05/12/2014  . Hypokalemia 07/03/2012   . Abdominal pain 07/01/2012  . Dehydration 07/01/2012  . Right kidney mass 07/01/2012  . Tobacco abuse 07/01/2012  . DM type 2 (diabetes mellitus, type 2) ([place]) 07/01/2012  . Chronic back pain 07/01/2012  . Abnormal gall bladder diagnostic imaging 07/01/2012  . Homocysteinemia ([place]) 11/29/2010  . Deep vein thrombosis (DVT) ([place]) 11/13/2010  . ARTHRITIS, LEFT KNEE 04/21/2009  . Essential hypertension 04/21/2009   Ihor Austin, [place]; [place]  Aldona Lento 06/26/2016, 4:25 PM  [place], [place], [address] Phone: [phone]   Fax:  [phone]  Name: [name] MRN: [id number] Date of Birth: 1945/11/18

## 2016-06-28 ENCOUNTER — Ambulatory Visit (HOSPITAL_COMMUNITY): Payer: No Typology Code available for payment source

## 2016-06-28 DIAGNOSIS — M542 Cervicalgia: Secondary | ICD-10-CM

## 2016-06-28 DIAGNOSIS — M5412 Radiculopathy, cervical region: Secondary | ICD-10-CM

## 2016-06-28 DIAGNOSIS — M6281 Muscle weakness (generalized): Secondary | ICD-10-CM

## 2016-06-28 NOTE — Therapy (Signed)
[place], [place], [address] Phone: [phone]   Fax:  [phone]  Physical Therapy Treatment  Patient Details  Name: [name] MRN: [id number] Date of Birth: 1945-04-28 Referring Provider: Melina Schools   Encounter Date: 06/28/2016      PT End of Session - 06/28/16 1459    Visit Number 16   Number of Visits 17   Date for PT Re-Evaluation 07/03/16   Authorization Type Medicare Part A and B    Authorization Time Period 09/07/32 - 1/96/22; recert done 2/97   Authorization - Visit Number 16   Authorization - Number of Visits 20   PT Start Time 9892   PT Stop Time 1512   PT Time Calculation (min) 40 min   Activity Tolerance Patient tolerated treatment well;No increased pain   Behavior During Therapy WFL for tasks assessed/performed      Past Medical History:  Diagnosis Date  . Arthritis   . Diabetes mellitus    non insulin  . History of blood clots   . History of gout    right ring finger  . Homocysteinemia ([place]) 11/29/2010  . Hypertension   . Kidney atrophy    rt.  . Left knee pain   . Multinodular goiter     Past Surgical History:  Procedure Laterality Date  . ABDOMINAL HYSTERECTOMY    . ESOPHAGOGASTRODUODENOSCOPY N/A 07/02/2012   Procedure: ESOPHAGOGASTRODUODENOSCOPY (EGD);  Surgeon: Rogene Houston, MD;  [facility];  Service: Endoscopy;  Laterality: N/A;  . ESOPHAGOGASTRODUODENOSCOPY N/A 09/13/2014   Procedure: ESOPHAGOGASTRODUODENOSCOPY (EGD);  Surgeon: Danie Binder, MD;  [facility];  Service: Endoscopy;  Laterality: N/A;  . hypertension    . KNEE SURGERY     rt. arthroscopic  . NECK SURGERY    . TONSILLECTOMY    . type ll diabetes      There were no vitals filed for this visit.      Subjective Assessment - 06/28/16 1435    Subjective Pt doing well today. Very tired from errands over last two days. Pain is improving HEP remains about the same.    Pertinent History DVT, DM, tobacco abuse, gout, cervical fusion, OA    Currently in Pain? Yes   Pain Score 2    Pain Orientation --  left upper neck neck base of skull                         [facility] Adult PT Treatment/Exercise - 06/28/16 0001      Neck Exercises: Seated   Cervical Rotation Both  A/ROM alternate sides for 2 minutes   Cervical Rotation Limitations Flexion/extesion A/ROM: 2 minutes seated     Neck Exercises: Supine   Other Supine Exercise cervical+capital flexion: 3x10 supine (strengthening)    Other Supine Exercise Supine GLutemax Bridging: 2x10 for cervical flexion P/ROM     Neck Exercises: Prone   Other Prone Exercise quadruped cervical extension: 2x10      Modalities   Modalities Electrical Stimulation     Electrical Stimulation   Electrical Stimulation Location Left lateral C3/4, Lef tattachemtn of levator scap    Electrical Stimulation Action TENS (sensory level only)   Electrical Stimulation Parameters Modulating current: 2 leads   Cervical extensors left C7 to C2 (at hairline); Middle trap   Electrical Stimulation Goals Pain  pan control durign exercises and stretching      Manual Therapy  Manual Therapy Soft tissue mobilization   Soft tissue mobilization Instrument assisted Soft tissue mobilizat  Left cervical extensors x 10 minutes                  PT Short Term Goals - 06/12/16 1447      PT SHORT TERM GOAL #1   Title Pt will be able to perform HEP independently in order to maximize AROM and improve overall function.   Baseline 3/13- "I try to do something every day, not necessarily every single thing"    Time 3   Period Weeks   Status Partially Met     PT SHORT TERM GOAL #2   Title Pt will have improved cervical AROM in all directions by 10 degrees or more with minimal to no pain to demonstrate improved function and maximize her QoL.   Baseline 3/13: rotation has improved significant, flexion/extension/lateral  flexion have improved but remain stiff    Time 3   Period Weeks   Status Partially Met     PT SHORT TERM GOAL #3   Title Pt will have improved R GH flex and abd AROM by 10 deg or more with minimal to no pain to maximize function at home and demonstrate improved overall function.   Time 3   Period Weeks   Status Achieved           PT Long Term Goals - 06/12/16 1449      PT LONG TERM GOAL #1   Title Pt will have improved cervical AROM to [facility] with no pain to maximize function and home and with driving.   Time 6   Period Weeks   Status On-going     PT LONG TERM GOAL #2   Title Pt will have improved R GH AROM to [facility] with no pain to maximize function at home and improve overall QoL.   Baseline 3/13- L has improved greatly, having a hard day with R shoudler however    Time 6   Period Weeks   Status Partially Met     PT LONG TERM GOAL #3   Title Pt will be able to drive and perform all household duties for 1 hour or > to demonstrate improved overall function and maximize QoL.   Baseline 3/13- patient reports this is going well, she has started driving in [place]; she can do some household duties but has to rest before an hour is up    Time 6   Period Weeks   Status On-going               Plan - 06/28/16 1500    Clinical Impression Statement Overall patient responding well to treatment session overall, able to complete all exercises without increased pain. Focused on high exersional full body strengthening for evidence based pain relief in chonic neck pain population. Also focused on strengthening of cervical flexion due to weakness and in attempts to assist in neurological inhibition to overactive extensors. Pt responding well to high pressure with tool assisted soft tissue work. Overall pt making slow but steady progress toward goals.    Clinical Impairments Affecting Rehab Potential time since injury, PMH   PT Treatment/Interventions ADLs/Self Care Home  Management;Biofeedback;Cryotherapy;Electrical Stimulation;Moist Heat;Functional mobility training;Therapeutic activities;Therapeutic exercise;Neuromuscular re-education;Patient/family education;Manual techniques;Passive range of motion;Dry needling;Taping   PT Next Visit Plan Continue TENS use during session, heat as needed.  Continue working on cervical flexion strenght and stretches, moving more to AAROM v PROM. Minimal focus on STM at  this time. Continue to progress back to normal high level functional movements (like squat and bridge) with normalization of cervical componenet.    PT Home Exercise Plan Eval- cervical isometrics (flex, ext, bil lat flex), gentle AROM in all directions; 3/13: trap and levator stretches       Patient will benefit from skilled therapeutic intervention in order to improve the following deficits and impairments:  Decreased activity tolerance, Decreased range of motion, Decreased strength, Hypomobility, Impaired sensation, Increased muscle spasms, Impaired UE functional use, Postural dysfunction, Pain  Visit Diagnosis: Cervicalgia  Muscle weakness (generalized)  Radiculopathy, cervical region     Problem List Patient Active Problem List   Diagnosis Date Noted  . Multinodular goiter   . Bilateral carotid bruits 11/08/2015  . Obesity (BMI 30.0-34.9) 11/08/2015  . Hyperlipidemia 11/08/2015  . Gastritis and gastroduodenitis   . Nausea with vomiting   . Exertional chest pain 08/13/2014  . Encounter for annual physical exam 05/12/2014  . Hypokalemia 07/03/2012  . Abdominal pain 07/01/2012  . Dehydration 07/01/2012  . Right kidney mass 07/01/2012  . Tobacco abuse 07/01/2012  . DM type 2 (diabetes mellitus, type 2) ([place]) 07/01/2012  . Chronic back pain 07/01/2012  . Abnormal gall bladder diagnostic imaging 07/01/2012  . Homocysteinemia ([place]) 11/29/2010  . Deep vein thrombosis (DVT) ([place]) 11/13/2010  . ARTHRITIS, LEFT KNEE 04/21/2009  . Essential  hypertension 04/21/2009    3:17 PM, 06/28/16 Etta Grandchild, PT, DPT Physical Therapist at [place] [phone] (office)     [place] [address] [place], [place], [address] Phone: [phone]   Fax:  [phone]  Name: DOROTHYE BERNI MRN: [id number] Date of Birth: 04-06-1945

## 2016-07-03 ENCOUNTER — Ambulatory Visit (HOSPITAL_COMMUNITY): Payer: No Typology Code available for payment source | Attending: Orthopedic Surgery | Admitting: Physical Therapy

## 2016-07-03 DIAGNOSIS — M542 Cervicalgia: Secondary | ICD-10-CM | POA: Diagnosis not present

## 2016-07-03 DIAGNOSIS — M6281 Muscle weakness (generalized): Secondary | ICD-10-CM | POA: Diagnosis not present

## 2016-07-03 DIAGNOSIS — M5412 Radiculopathy, cervical region: Secondary | ICD-10-CM

## 2016-07-03 NOTE — Therapy (Signed)
[place], [place], [address] Phone: [phone]   Fax:  [phone]  Physical Therapy Treatment (DC)  Patient Details  Name: [name] MRN: [id number] Date of Birth: 1946-02-03 Referring Provider: Melina Schools   Encounter Date: 07/03/2016      PT End of Session - 07/03/16 1803    Visit Number 17   Number of Visits 17   Authorization Type Medicare Part A and B    Authorization Time Period 06/07/08 - 1/75/10; recert done 2/58   Authorization - Visit Number 61   Authorization - Number of Visits 20   PT Start Time 1433   PT Stop Time 1511   PT Time Calculation (min) 38 min   Activity Tolerance Patient tolerated treatment well   Behavior During Therapy [facility] for tasks assessed/performed      Past Medical History:  Diagnosis Date  . Arthritis   . Diabetes mellitus    non insulin  . History of blood clots   . History of gout    right ring finger  . Homocysteinemia ([place]) 11/29/2010  . Hypertension   . Kidney atrophy    rt.  . Left knee pain   . Multinodular goiter     Past Surgical History:  Procedure Laterality Date  . ABDOMINAL HYSTERECTOMY    . ESOPHAGOGASTRODUODENOSCOPY N/A 07/02/2012   Procedure: ESOPHAGOGASTRODUODENOSCOPY (EGD);  Surgeon: Rogene Houston, MD;  [facility];  Service: Endoscopy;  Laterality: N/A;  . ESOPHAGOGASTRODUODENOSCOPY N/A 09/13/2014   Procedure: ESOPHAGOGASTRODUODENOSCOPY (EGD);  Surgeon: Danie Binder, MD;  [facility];  Service: Endoscopy;  Laterality: N/A;  . hypertension    . KNEE SURGERY     rt. arthroscopic  . NECK SURGERY    . TONSILLECTOMY    . type ll diabetes      There were no vitals filed for this visit.      Subjective Assessment - 07/03/16 1435    Subjective Patient arrives today stating she is feeling better, doing OK overall. She reports that everything has gotten easier, nothing is perfect. She reports that she liked the TENS  when it has been used.    Pertinent History DVT, DM, tobacco abuse, gout, cervical fusion, OA    How long can you sit comfortably? 4/3- "can sit awhile, not sure how long in minutes"    How long can you stand comfortably? 4/3- not over 15 minutes    How long can you walk comfortably? 4/3- can do household distances comfortably, has trouble with store distances due to neck and her back    Diagnostic tests images from 11/17 reveal intact cervical fusion site as well as stable anterior cervical fractures    Patient Stated Goals "get healthy"    Currently in Pain? Yes   Pain Score 2    Pain Location Neck   Pain Orientation Left   Pain Descriptors / Indicators Aching;Sore   Pain Type Chronic pain   Pain Radiating Towards none    Pain Onset More than a month ago   Pain Frequency Constant   Aggravating Factors  exercise sometimes   Pain Relieving Factors not sure, heat, massage    Effect of Pain on Daily Activities can do most anything with extended time, still cannot lift items             [facility] PT Assessment - 07/03/16 0001      Assessment   Medical Diagnosis s/p  fracture of cervical vertebrae    Referring Provider Melina Schools    Onset Date/Surgical Date --  November 2017   Next MD Visit Dr. Rolena Infante in June    Prior Therapy none      Balance Screen   Has the patient fallen in the past 6 months No   Has the patient had a decrease in activity level because of a fear of falling?  No   Is the patient reluctant to leave their home because of a fear of falling?  No     Prior Function   Level of Independence Independent;Independent with basic ADLs;Independent with gait;Independent with transfers   Vocation Retired     AROM   Right Shoulder Flexion 110 Degrees   Right Shoulder ABduction 115 Degrees  significant compensations noted    Right Shoulder Internal Rotation --  T11   Right Shoulder External Rotation --  T2   Left Shoulder Flexion --  WFL    Left Shoulder ABduction  --  [facility] but compensations present    Left Shoulder Internal Rotation --  T7   Left Shoulder External Rotation --  T3   Cervical Flexion 37   Cervical Extension 27   Cervical - Right Side Bend 22   Cervical - Left Side Bend 24   Cervical - Right Rotation 58   Cervical - Left Rotation 58   Thoracic Flexion WFL    Thoracic Extension min limitation   structural kyphosis    Thoracic - Right Side Bend [facility]    Thoracic - Left Side Bend [facility]    Thoracic - Right Rotation [facility]    Thoracic - Left Rotation min limitation      Palpation   Palpation comment much improved, approx 30% limitation                              PT Education - 07/03/16 1803    Education provided Yes   Education Details DC today, individual exercise program/[employer]/silver sneakers/water exercise/TENS home unit order    Person(s) Educated Patient   Methods Explanation   Comprehension Verbalized understanding          PT Short Term Goals - 07/03/16 1451      PT SHORT TERM GOAL #1   Title Pt will be able to perform HEP independently in order to maximize AROM and improve overall function.   Baseline 4/3- reports compliance with "something every day"    Time 3   Period Weeks   Status Partially Met     PT SHORT TERM GOAL #2   Title Pt will have improved cervical AROM in all directions by 10 degrees or more with minimal to no pain to demonstrate improved function and maximize her QoL.   Baseline 4/3- no significant changes    Time 3   Period Weeks   Status Partially Met     PT SHORT TERM GOAL #3   Title Pt will have improved R GH flex and abd AROM by 10 deg or more with minimal to no pain to maximize function at home and demonstrate improved overall function.   Baseline 4/3- seems to have lost som ROM In R shoulder, however patient reports problems here are not constant    Time 3   Period Weeks   Status On-going           PT Long Term Goals - 07/03/16 1452  PT LONG TERM GOAL #1    Title Pt will have improved cervical AROM to [facility] with no pain to maximize function and home and with driving.   Time 6   Period Weeks   Status On-going     PT LONG TERM GOAL #2   Title Pt will have improved R GH AROM to [facility] with no pain to maximize function at home and improve overall QoL.   Baseline 07-07-2022 shoulder function fluctuates    Time 6   Period Weeks   Status On-going     PT LONG TERM GOAL #3   Title Pt will be able to drive and perform all household duties for 1 hour or > to demonstrate improved overall function and maximize QoL.   Baseline Jul 07, 2022- reports success in meeting this goal based on home function    Time 6   Period Weeks   Status Partially Met               Plan - 2016/07/06 1804    Clinical Impression Statement Re-assessment performed today. Patient reports she is feeling better and feels that the TENS unit used in PT has been beneficial. Objective measures, however, show minimal change since the time of last re-assessment although patient does verbalize some changes in QOL with PT. At this point recommend DC to home program- continue with HEP, follow up with home TENS unit with MD, individual exercise program such as walking club/[employer]/silver sneakers/etc. Did discuss and correct lifting mechanics due to patient complaints of cervical pain, however unable to tolerate even correct lifting form due to knee pain. Recommend exercise program to assist in addressing this as well moving forward. DC today due to lack of significant progress.    Rehab Potential Fair   Clinical Impairments Affecting Rehab Potential time since injury, PMH   PT Next Visit Plan DC today       Patient will benefit from skilled therapeutic intervention in order to improve the following deficits and impairments:  Decreased activity tolerance, Decreased range of motion, Decreased strength, Hypomobility, Impaired sensation, Increased muscle spasms, Impaired UE functional use, Postural dysfunction,  Pain  Visit Diagnosis: Cervicalgia  Muscle weakness (generalized)  Radiculopathy, cervical region       G-Codes - 07-06-16 1806    Functional Assessment Tool Used (Outpatient Only) Based on skilled clinical assessment of posture, ROM, pain patterns    Functional Limitation Changing and maintaining body position   Changing and Maintaining Body Position Goal Status (A2505) At least 20 percent but less than 40 percent impaired, limited or restricted   Changing and Maintaining Body Position Discharge Status (L9767) At least 40 percent but less than 60 percent impaired, limited or restricted      Problem List Patient Active Problem List   Diagnosis Date Noted  . Multinodular goiter   . Bilateral carotid bruits 11/08/2015  . Obesity (BMI 30.0-34.9) 11/08/2015  . Hyperlipidemia 11/08/2015  . Gastritis and gastroduodenitis   . Nausea with vomiting   . Exertional chest pain 08/13/2014  . Encounter for annual physical exam 05/12/2014  . Hypokalemia July 06, 2012  . Abdominal pain 07/01/2012  . Dehydration 07/01/2012  . Right kidney mass 07/01/2012  . Tobacco abuse 07/01/2012  . DM type 2 (diabetes mellitus, type 2) ([place]) 07/01/2012  . Chronic back pain 07/01/2012  . Abnormal gall bladder diagnostic imaging 07/01/2012  . Homocysteinemia ([place]) 11/29/2010  . Deep vein thrombosis (DVT) ([place]) 11/13/2010  . ARTHRITIS, LEFT KNEE 04/21/2009  . Essential hypertension 04/21/2009  PHYSICAL THERAPY DISCHARGE SUMMARY  Visits from Start of Care: 17  Current functional level related to goals / functional outcomes: Patient appears to have maximized benefit from skilled PT services; DC due to lack of progress    Remaining deficits: Cervical stiffness, postural impairment, shoulder stiffness, cervical pain and muscle spasm    Education / Equipment: DC today, individual exercise program/[employer]/silver sneakers/water exercise/TENS home unit order  Plan: Patient agrees to discharge.  Patient  goals were partially met. Patient is being discharged due to lack of progress.  ?????      Deniece Ree PT, DPT [place] [address] [place], [place], [address] Phone: [phone]   Fax:  [phone]  Name: KATTIA SELLEY MRN: [id number] Date of Birth: 1945/06/24

## 2016-07-05 ENCOUNTER — Ambulatory Visit (HOSPITAL_COMMUNITY): Payer: No Typology Code available for payment source | Admitting: Physical Therapy

## 2016-07-09 ENCOUNTER — Telehealth (HOSPITAL_COMMUNITY): Payer: Self-pay | Admitting: Physical Therapy

## 2016-07-09 ENCOUNTER — Ambulatory Visit (INDEPENDENT_AMBULATORY_CARE_PROVIDER_SITE_OTHER): Payer: Medicare Other | Admitting: Endocrinology

## 2016-07-09 VITALS — BP 122/74 | HR 87 | Ht 70.0 in | Wt 218.0 lb

## 2016-07-09 DIAGNOSIS — E042 Nontoxic multinodular goiter: Secondary | ICD-10-CM

## 2016-07-09 NOTE — Progress Notes (Signed)
Subjective:    Patient ID: [name], female    DOB: [birth year], 71 y.o.   MRN: [id number]  HPI Pt returns for f/u of multinodular goiter (dx'ed 2017, incidentally on carotid US; bx in 2017 showed BENIGN FOLLICULAR NODULE (BETHESDA CATEGORY II); she has never had dedicated thyroid imaging).  She says there is no change in the size of the thyroid. Past Medical History:  Diagnosis Date  . Arthritis   . Diabetes mellitus    non insulin  . History of blood clots   . History of gout    right ring finger  . Homocysteinemia ([place]) 11/29/2010  . Hypertension   . Kidney atrophy    rt.  . Left knee pain   . Multinodular goiter     Past Surgical History:  Procedure Laterality Date  . ABDOMINAL HYSTERECTOMY    . ESOPHAGOGASTRODUODENOSCOPY N/A 07/02/2012   Procedure: ESOPHAGOGASTRODUODENOSCOPY (EGD);  Surgeon: Rogene Houston, MD;  [facility];  Service: Endoscopy;  Laterality: N/A;  . ESOPHAGOGASTRODUODENOSCOPY N/A 09/13/2014   Procedure: ESOPHAGOGASTRODUODENOSCOPY (EGD);  Surgeon: Danie Binder, MD;  [facility];  Service: Endoscopy;  Laterality: N/A;  . hypertension    . KNEE SURGERY     rt. arthroscopic  . NECK SURGERY    . TONSILLECTOMY    . type ll diabetes      Social History   Social History  . Marital status: Married    Spouse name: N/A  . Number of children: N/A  . Years of education: N/A   Occupational History  . Not on file.   Social History Main Topics  . Smoking status: Current Every Day Smoker    Packs/day: 0.50    Years: 50.00    Types: Cigarettes  . Smokeless tobacco: Never Used  . Alcohol use Yes     Comment: occasionally  . Drug use: No  . Sexual activity: Yes    Birth control/ protection: Surgical   Other Topics Concern  . Not on file   Social History Narrative  . No narrative on file    Current Outpatient Prescriptions on File Prior to Visit  Medication Sig Dispense Refill  . albuterol (PROAIR HFA) 108 (90 Base)  MCG/ACT inhaler Inhale 1-2 puffs into the lungs every 6 (six) hours as needed for wheezing or shortness of breath.    Marland Kitchen aspirin 81 MG tablet Take 81 mg by mouth every evening.     Marland Kitchen b complex vitamins tablet Take 2 tablets by mouth daily with breakfast.     . Cholecalciferol (VITAMIN D3) 1000 UNITS CAPS Take 1 tablet by mouth daily.      . Flaxseed, Linseed, (FLAXSEED OIL) 1200 MG CAPS Take 1 capsule by mouth daily.    . folic acid (FOLVITE) 1 MG tablet TAKE ONE (1) TABLET BY MOUTH EVERY DAY (Patient taking differently: Take 1 mg by mouth in the morning) 60 tablet 3  . gabapentin (NEURONTIN) 300 MG capsule Take 300 mg by mouth 3 (three) times daily as needed (for nerve pain).     Marland Kitchen glimepiride (AMARYL) 2 MG tablet Take 2 mg by mouth 2 (two) times daily.    . Lido-Capsaicin-Men-Methyl Sal (MEDI-PATCH-LIDOCAINE EX) Apply 1 patch topically daily as needed (for pain).     Marland Kitchen linagliptin (TRADJENTA) 5 MG TABS tablet Take 5 mg by mouth daily.      . metFORMIN (GLUCOPHAGE-XR) 500 MG 24 hr tablet Take 500 mg by mouth 2 (two) times  daily.     . Misc Natural Products (JOINT HEALTH) CAPS Take 1 capsule by mouth 2 (two) times daily.     Marland Kitchen NIFEDICAL XL 60 MG 24 hr tablet Take 60 mg by mouth daily.     . Omega-3 Fatty Acids (FISH OIL) 1200 MG CAPS Take 1 capsule by mouth daily.    . pantoprazole (PROTONIX) 40 MG tablet Take 40 mg by mouth daily.    . pioglitazone (ACTOS) 30 MG tablet Take 30 mg by mouth daily.      . simvastatin (ZOCOR) 20 MG tablet Take 20 mg by mouth At bedtime.    . vitamin C (ASCORBIC ACID) 500 MG tablet Take 500 mg by mouth daily.     No current facility-administered medications on file prior to visit.     Allergies  Allergen Reactions  . Ace Inhibitors Swelling  . Celecoxib Other (See Comments)    REACTION: Kidney issue  . Hydrocodone-Acetaminophen Itching    BRAND NAME: VICODIN  . Zofran [Ondansetron Hcl]     HICCUPS    Family History  Problem Relation Age of Onset  .  Stroke Mother   . Hypertension Mother   . Prostate cancer Father   . Diabetes Son   . Diabetes Maternal Aunt   . Diabetes Maternal Uncle   . Diabetes Maternal Grandmother   . Thyroid disease Neg Hx     BP 122/74   Pulse 87   Ht 5\' 10"  (1.778 m)   Wt 218 lb (98.9 kg)   SpO2 97%   BMI 31.28 kg/m   Review of Systems Denies neck pain.     Objective:   Physical Exam VS: see vs page GEN: no distress eyes: no periorbital swelling, no proptosis NECK: a healed scar is present (C-spine procedure).  The thyroid has a multinodular surface.  The right sided nodule (2-3 cm) is again easily palpable.       Assessment & Plan:  Multinodular goiter, due for recheck.  Patient Instructions  Let's recheck the ultrasound.  you will receive a phone call, about a day and time for an appointment. most of the time, a "lumpy thyroid" will eventually become overactive.  this is usually a slow process, happening over the span of many years.  Please come back for a follow-up appointment in 1 year.

## 2016-07-09 NOTE — Patient Instructions (Addendum)
Let's recheck the ultrasound.  you will receive a phone call, about a day and time for an appointment. most of the time, a "lumpy thyroid" will eventually become overactive.  this is usually a slow process, happening over the span of many years. Please come back for a follow-up appointment in 1 year.   

## 2016-07-09 NOTE — Telephone Encounter (Signed)
First attempted to call patient's home phone and left message asking her to call us back.  Then called patient's mobile and spoke to patient, educated that we have signed TENS order from MD and she can come pick it up at any time so she may get a personal unit to help address pain.  Deniece Ree PT, DPT [phone]

## 2016-07-10 ENCOUNTER — Ambulatory Visit (HOSPITAL_COMMUNITY): Payer: No Typology Code available for payment source

## 2016-07-11 ENCOUNTER — Ambulatory Visit (HOSPITAL_COMMUNITY)
Admission: RE | Admit: 2016-07-11 | Discharge: 2016-07-11 | Disposition: A | Discharge status: Home / Self Care | Payer: Medicare Other | Source: Ambulatory Visit | Attending: Family Medicine | Admitting: Family Medicine

## 2016-07-11 ENCOUNTER — Other Ambulatory Visit (HOSPITAL_COMMUNITY): Payer: Self-pay | Admitting: Family Medicine

## 2016-07-11 ENCOUNTER — Other Ambulatory Visit: Payer: Self-pay | Admitting: Endocrinology

## 2016-07-11 DIAGNOSIS — E119 Type 2 diabetes mellitus without complications: Secondary | ICD-10-CM | POA: Diagnosis not present

## 2016-07-11 DIAGNOSIS — E6609 Other obesity due to excess calories: Secondary | ICD-10-CM | POA: Diagnosis not present

## 2016-07-11 DIAGNOSIS — M7989 Other specified soft tissue disorders: Secondary | ICD-10-CM

## 2016-07-11 DIAGNOSIS — E1129 Type 2 diabetes mellitus with other diabetic kidney complication: Secondary | ICD-10-CM | POA: Diagnosis not present

## 2016-07-11 DIAGNOSIS — I82811 Embolism and thrombosis of superficial veins of right lower extremities: Secondary | ICD-10-CM | POA: Diagnosis not present

## 2016-07-11 DIAGNOSIS — I82409 Acute embolism and thrombosis of unspecified deep veins of unspecified lower extremity: Secondary | ICD-10-CM

## 2016-07-11 DIAGNOSIS — I10 Essential (primary) hypertension: Secondary | ICD-10-CM | POA: Diagnosis not present

## 2016-07-11 DIAGNOSIS — E782 Mixed hyperlipidemia: Secondary | ICD-10-CM | POA: Diagnosis not present

## 2016-07-11 DIAGNOSIS — Z6834 Body mass index (BMI) 34.0-34.9, adult: Secondary | ICD-10-CM | POA: Diagnosis not present

## 2016-07-12 ENCOUNTER — Encounter (HOSPITAL_COMMUNITY): Payer: Medicare Other | Admitting: Physical Therapy

## 2016-07-17 ENCOUNTER — Ambulatory Visit
Admission: RE | Admit: 2016-07-17 | Discharge: 2016-07-17 | Disposition: A | Discharge status: Home / Self Care | Payer: Medicare Other | Source: Ambulatory Visit | Attending: Endocrinology | Admitting: Endocrinology

## 2016-07-17 DIAGNOSIS — E042 Nontoxic multinodular goiter: Secondary | ICD-10-CM | POA: Diagnosis not present

## 2016-07-25 ENCOUNTER — Other Ambulatory Visit: Payer: Self-pay | Admitting: Obstetrics and Gynecology

## 2016-07-25 DIAGNOSIS — Z1231 Encounter for screening mammogram for malignant neoplasm of breast: Secondary | ICD-10-CM

## 2016-07-31 DIAGNOSIS — H35033 Hypertensive retinopathy, bilateral: Secondary | ICD-10-CM | POA: Diagnosis not present

## 2016-08-01 ENCOUNTER — Ambulatory Visit (HOSPITAL_COMMUNITY): Payer: Medicare Other

## 2016-08-06 ENCOUNTER — Ambulatory Visit (HOSPITAL_COMMUNITY)
Admission: RE | Admit: 2016-08-06 | Discharge: 2016-08-06 | Disposition: A | Discharge status: Home / Self Care | Payer: Medicare Other | Source: Ambulatory Visit | Attending: Obstetrics and Gynecology | Admitting: Obstetrics and Gynecology

## 2016-08-06 DIAGNOSIS — Z1231 Encounter for screening mammogram for malignant neoplasm of breast: Secondary | ICD-10-CM | POA: Insufficient documentation

## 2016-08-13 ENCOUNTER — Other Ambulatory Visit: Payer: Medicare Other | Admitting: Obstetrics and Gynecology

## 2016-08-14 DIAGNOSIS — B351 Tinea unguium: Secondary | ICD-10-CM | POA: Diagnosis not present

## 2016-08-14 DIAGNOSIS — E1142 Type 2 diabetes mellitus with diabetic polyneuropathy: Secondary | ICD-10-CM | POA: Diagnosis not present

## 2016-08-14 DIAGNOSIS — L851 Acquired keratosis [keratoderma] palmaris et plantaris: Secondary | ICD-10-CM | POA: Diagnosis not present

## 2016-08-15 DIAGNOSIS — E113291 Type 2 diabetes mellitus with mild nonproliferative diabetic retinopathy without macular edema, right eye: Secondary | ICD-10-CM | POA: Diagnosis not present

## 2016-08-15 DIAGNOSIS — H25013 Cortical age-related cataract, bilateral: Secondary | ICD-10-CM | POA: Diagnosis not present

## 2016-08-15 DIAGNOSIS — H2512 Age-related nuclear cataract, left eye: Secondary | ICD-10-CM | POA: Diagnosis not present

## 2016-08-15 DIAGNOSIS — H2513 Age-related nuclear cataract, bilateral: Secondary | ICD-10-CM | POA: Diagnosis not present

## 2016-08-15 DIAGNOSIS — H35033 Hypertensive retinopathy, bilateral: Secondary | ICD-10-CM | POA: Diagnosis not present

## 2016-08-29 ENCOUNTER — Encounter: Payer: Self-pay | Admitting: Obstetrics and Gynecology

## 2016-08-29 ENCOUNTER — Encounter (INDEPENDENT_AMBULATORY_CARE_PROVIDER_SITE_OTHER): Payer: Self-pay

## 2016-08-29 ENCOUNTER — Ambulatory Visit (INDEPENDENT_AMBULATORY_CARE_PROVIDER_SITE_OTHER): Payer: Medicare Other | Admitting: Obstetrics and Gynecology

## 2016-08-29 VITALS — BP 128/74 | HR 84 | Ht 70.5 in | Wt 215.0 lb

## 2016-08-29 DIAGNOSIS — Z01419 Encounter for gynecological examination (general) (routine) without abnormal findings: Secondary | ICD-10-CM | POA: Diagnosis not present

## 2016-08-29 NOTE — Progress Notes (Addendum)
Assessment:  Annual Gyn Exam   Plan:  1. pap smear done, next pap due in 3 years 2. return annually or prn 3    Annual mammogram advised Subjective:  [name] is a 72 y.o. female No obstetric history on file. who presents for annual exam. No LMP recorded. Patient has had a hysterectomy. The patient has no acute complaints today.   The following portions of the patient's history were reviewed and updated as appropriate: allergies, current medications, past family history, past medical history, past social history, past surgical history and problem list. Past Medical History:  Diagnosis Date  . Arthritis   . Diabetes mellitus    non insulin  . History of blood clots   . History of gout    right ring finger  . Homocysteinemia ([place]) 11/29/2010  . Hypertension   . Kidney atrophy    rt.  . Left knee pain   . Multinodular goiter     Past Surgical History:  Procedure Laterality Date  . ABDOMINAL HYSTERECTOMY    . ESOPHAGOGASTRODUODENOSCOPY N/A 07/02/2012   Procedure: ESOPHAGOGASTRODUODENOSCOPY (EGD);  Surgeon: Rogene Houston, MD;  [facility];  Service: Endoscopy;  Laterality: N/A;  . ESOPHAGOGASTRODUODENOSCOPY N/A 09/13/2014   Procedure: ESOPHAGOGASTRODUODENOSCOPY (EGD);  Surgeon: Danie Binder, MD;  [facility];  Service: Endoscopy;  Laterality: N/A;  . hypertension    . KNEE SURGERY     rt. arthroscopic  . NECK SURGERY    . TONSILLECTOMY    . type ll diabetes       Current Outpatient Prescriptions:  .  albuterol (PROAIR HFA) 108 (90 Base) MCG/ACT inhaler, Inhale 1-2 puffs into the lungs every 6 (six) hours as needed for wheezing or shortness of breath., Disp: , Rfl:  .  aspirin 81 MG tablet, Take 81 mg by mouth every evening. , Disp: , Rfl:  .  b complex vitamins tablet, Take 2 tablets by mouth daily with breakfast. , Disp: , Rfl:  .  Cholecalciferol (VITAMIN D3) 1000 UNITS CAPS, Take 1 tablet by mouth daily.  , Disp: , Rfl:  .  Flaxseed,  Linseed, (FLAXSEED OIL) 1200 MG CAPS, Take 1 capsule by mouth daily., Disp: , Rfl:  .  folic acid (FOLVITE) 1 MG tablet, TAKE ONE (1) TABLET BY MOUTH EVERY DAY (Patient taking differently: Take 1 mg by mouth in the morning), Disp: 60 tablet, Rfl: 3 .  gabapentin (NEURONTIN) 300 MG capsule, Take 300 mg by mouth 3 (three) times daily as needed (for nerve pain). , Disp: , Rfl:  .  glimepiride (AMARYL) 2 MG tablet, Take 2 mg by mouth 2 (two) times daily., Disp: , Rfl:  .  Lido-Capsaicin-Men-Methyl Sal (MEDI-PATCH-LIDOCAINE EX), Apply 1 patch topically daily as needed (for pain). , Disp: , Rfl:  .  linagliptin (TRADJENTA) 5 MG TABS tablet, Take 5 mg by mouth daily.  , Disp: , Rfl:  .  metFORMIN (GLUCOPHAGE-XR) 500 MG 24 hr tablet, Take 500 mg by mouth 2 (two) times daily. , Disp: , Rfl:  .  Misc Natural Products (JOINT HEALTH) CAPS, Take 1 capsule by mouth 2 (two) times daily. , Disp: , Rfl:  .  NIFEDICAL XL 60 MG 24 hr tablet, Take 60 mg by mouth daily. , Disp: , Rfl:  .  Omega-3 Fatty Acids (FISH OIL) 1200 MG CAPS, Take 1 capsule by mouth daily., Disp: , Rfl:  .  pantoprazole (PROTONIX) 40 MG tablet, Take 40 mg by mouth daily.,  Disp: , Rfl:  .  pioglitazone (ACTOS) 30 MG tablet, Take 30 mg by mouth daily.  , Disp: , Rfl:  .  simvastatin (ZOCOR) 20 MG tablet, Take 20 mg by mouth At bedtime., Disp: , Rfl:  .  vitamin C (ASCORBIC ACID) 500 MG tablet, Take 500 mg by mouth daily., Disp: , Rfl:   Review of Systems Otherwise negative for acute change except as noted in the HPI.  Objective:  BP 128/74 (BP Location: Right Arm, Patient Position: Sitting, Cuff Size: Normal)   Pulse 84   Ht 5' 10.5" (1.791 m)   Wt 215 lb (97.5 kg)   BMI 30.41 kg/m    BMI: Body mass index is 30.41 kg/m.  General Appearance: Alert, appropriate appearance for age. No acute distress HEENT: Grossly normal Neck / Thyroid:  Cardiovascular: RRR; normal S1, S2, no murmur Lungs: CTA bilaterally Back: No CVAT Breast Exam: No  dimpling, nipple retraction or discharge. No masses or nodes.  Gastrointestinal: Soft, non-tender, no masses or organomegaly Pelvic Exam:  External genitalia: normal general appearance Vaginal: normal mucosa without prolapse or lesions Cervix: absent Uterus: surgically absent   Well supported  Rectal:  normal rectal, no masses guaiac negative stool obtained. Lymphatic Exam: Non-palpable nodes in neck, clavicular, axillary, or inguinal regions  Skin: no rash or abnormalities Neurologic: Nor.jfs mal gait and speech, no tremor  Psychiatric: Alert and oriented, appropriate affect.  Urinalysis:Not done   By signing my name below, I, [name], attest that this documentation has been prepared under the direction and in the presence of Jonnie Kind, MD . Electronically Signed: Evelene Lewis, Scribe. 08/29/2016. 12:26 PM. I personally performed the services described in this documentation, which was SCRIBED in my presence. The recorded information has been reviewed and considered accurate. It has been edited as necessary during review. Jonnie Kind, MD

## 2016-09-06 DIAGNOSIS — M1711 Unilateral primary osteoarthritis, right knee: Secondary | ICD-10-CM | POA: Diagnosis not present

## 2016-09-13 DIAGNOSIS — M1711 Unilateral primary osteoarthritis, right knee: Secondary | ICD-10-CM | POA: Diagnosis not present

## 2016-09-24 DIAGNOSIS — M1711 Unilateral primary osteoarthritis, right knee: Secondary | ICD-10-CM | POA: Diagnosis not present

## 2016-09-25 DIAGNOSIS — H25812 Combined forms of age-related cataract, left eye: Secondary | ICD-10-CM | POA: Diagnosis not present

## 2016-09-25 DIAGNOSIS — H2512 Age-related nuclear cataract, left eye: Secondary | ICD-10-CM | POA: Diagnosis not present

## 2016-09-28 DIAGNOSIS — M542 Cervicalgia: Secondary | ICD-10-CM | POA: Diagnosis not present

## 2016-09-28 DIAGNOSIS — S335XXD Sprain of ligaments of lumbar spine, subsequent encounter: Secondary | ICD-10-CM | POA: Diagnosis not present

## 2016-10-26 DIAGNOSIS — M79671 Pain in right foot: Secondary | ICD-10-CM | POA: Diagnosis not present

## 2016-10-26 DIAGNOSIS — S86111A Strain of other muscle(s) and tendon(s) of posterior muscle group at lower leg level, right leg, initial encounter: Secondary | ICD-10-CM | POA: Diagnosis not present

## 2016-10-30 DIAGNOSIS — L851 Acquired keratosis [keratoderma] palmaris et plantaris: Secondary | ICD-10-CM | POA: Diagnosis not present

## 2016-10-30 DIAGNOSIS — B351 Tinea unguium: Secondary | ICD-10-CM | POA: Diagnosis not present

## 2016-10-30 DIAGNOSIS — E1142 Type 2 diabetes mellitus with diabetic polyneuropathy: Secondary | ICD-10-CM | POA: Diagnosis not present

## 2016-11-05 DIAGNOSIS — H2511 Age-related nuclear cataract, right eye: Secondary | ICD-10-CM | POA: Diagnosis not present

## 2016-11-05 DIAGNOSIS — H25011 Cortical age-related cataract, right eye: Secondary | ICD-10-CM | POA: Diagnosis not present

## 2016-11-09 DIAGNOSIS — S86111D Strain of other muscle(s) and tendon(s) of posterior muscle group at lower leg level, right leg, subsequent encounter: Secondary | ICD-10-CM | POA: Diagnosis not present

## 2016-11-09 DIAGNOSIS — M79671 Pain in right foot: Secondary | ICD-10-CM | POA: Diagnosis not present

## 2016-11-12 ENCOUNTER — Other Ambulatory Visit (HOSPITAL_COMMUNITY): Payer: Self-pay | Admitting: Podiatry

## 2016-11-12 DIAGNOSIS — S86111D Strain of other muscle(s) and tendon(s) of posterior muscle group at lower leg level, right leg, subsequent encounter: Secondary | ICD-10-CM

## 2016-11-20 ENCOUNTER — Ambulatory Visit (HOSPITAL_COMMUNITY): Payer: Medicare Other

## 2016-11-20 DIAGNOSIS — H2511 Age-related nuclear cataract, right eye: Secondary | ICD-10-CM | POA: Diagnosis not present

## 2016-11-20 DIAGNOSIS — H25011 Cortical age-related cataract, right eye: Secondary | ICD-10-CM | POA: Diagnosis not present

## 2016-11-22 ENCOUNTER — Ambulatory Visit (HOSPITAL_COMMUNITY)
Admission: RE | Admit: 2016-11-22 | Discharge: 2016-11-22 | Disposition: A | Discharge status: Home / Self Care | Payer: Medicare Other | Source: Ambulatory Visit | Attending: Podiatry | Admitting: Podiatry

## 2016-11-22 DIAGNOSIS — X58XXXD Exposure to other specified factors, subsequent encounter: Secondary | ICD-10-CM | POA: Diagnosis not present

## 2016-11-22 DIAGNOSIS — M659 Synovitis and tenosynovitis, unspecified: Secondary | ICD-10-CM | POA: Insufficient documentation

## 2016-11-22 DIAGNOSIS — S86111D Strain of other muscle(s) and tendon(s) of posterior muscle group at lower leg level, right leg, subsequent encounter: Secondary | ICD-10-CM | POA: Diagnosis not present

## 2016-11-22 DIAGNOSIS — M25571 Pain in right ankle and joints of right foot: Secondary | ICD-10-CM | POA: Diagnosis not present

## 2016-11-22 DIAGNOSIS — M7989 Other specified soft tissue disorders: Secondary | ICD-10-CM | POA: Diagnosis not present

## 2016-11-27 DIAGNOSIS — M79671 Pain in right foot: Secondary | ICD-10-CM | POA: Diagnosis not present

## 2016-11-27 DIAGNOSIS — M76821 Posterior tibial tendinitis, right leg: Secondary | ICD-10-CM | POA: Diagnosis not present

## 2016-11-27 DIAGNOSIS — M659 Synovitis and tenosynovitis, unspecified: Secondary | ICD-10-CM | POA: Diagnosis not present

## 2016-12-04 ENCOUNTER — Encounter (HOSPITAL_COMMUNITY): Payer: Self-pay | Admitting: Physical Therapy

## 2016-12-04 ENCOUNTER — Ambulatory Visit (HOSPITAL_COMMUNITY): Payer: Medicare Other | Attending: Podiatry | Admitting: Physical Therapy

## 2016-12-04 DIAGNOSIS — R600 Localized edema: Secondary | ICD-10-CM

## 2016-12-04 DIAGNOSIS — M25671 Stiffness of right ankle, not elsewhere classified: Secondary | ICD-10-CM | POA: Insufficient documentation

## 2016-12-04 DIAGNOSIS — M25674 Stiffness of right foot, not elsewhere classified: Secondary | ICD-10-CM | POA: Insufficient documentation

## 2016-12-04 DIAGNOSIS — M25571 Pain in right ankle and joints of right foot: Secondary | ICD-10-CM | POA: Diagnosis not present

## 2016-12-04 DIAGNOSIS — R262 Difficulty in walking, not elsewhere classified: Secondary | ICD-10-CM | POA: Insufficient documentation
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
## 2016-12-04 NOTE — Patient Instructions (Signed)
   ANKLE CIRCLES  Move your ankle in a circular pattern one direction for several repetitions and then reverse the direction.   Repeat 10 times clockwise and 10 times counter clockwise at least 3 times per day.    ANKLE ABC's   While in a seated position, write out the alphabet in the air with your big toe.  Your ankle should be moving as you perform this, NOT your knee or hip.   Repeat 1-2 times through, at least 3 times per day.    SEATED CALF STRETCH - GASTROC  While sitting, use a towel or other strap looped around your foot. Gently pull your ankle back until a stretch is felt along the back of your lower leg. Maintain your target knee straight the entire time.  Hold for 30 seconds.  Repeat 2-3 times at least 3 times per day.

## 2016-12-06 ENCOUNTER — Ambulatory Visit (HOSPITAL_COMMUNITY): Payer: Medicare Other

## 2016-12-06 DIAGNOSIS — M25671 Stiffness of right ankle, not elsewhere classified: Secondary | ICD-10-CM | POA: Diagnosis not present

## 2016-12-06 DIAGNOSIS — R600 Localized edema: Secondary | ICD-10-CM | POA: Diagnosis not present

## 2016-12-06 DIAGNOSIS — M25674 Stiffness of right foot, not elsewhere classified: Secondary | ICD-10-CM

## 2016-12-06 DIAGNOSIS — M25571 Pain in right ankle and joints of right foot: Secondary | ICD-10-CM

## 2016-12-06 DIAGNOSIS — R262 Difficulty in walking, not elsewhere classified: Secondary | ICD-10-CM
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
## 2016-12-07 DIAGNOSIS — I10 Essential (primary) hypertension: Secondary | ICD-10-CM | POA: Diagnosis not present

## 2016-12-07 DIAGNOSIS — E041 Nontoxic single thyroid nodule: Secondary | ICD-10-CM | POA: Diagnosis not present

## 2016-12-07 DIAGNOSIS — Z6833 Body mass index (BMI) 33.0-33.9, adult: Secondary | ICD-10-CM | POA: Diagnosis not present

## 2016-12-07 DIAGNOSIS — M1991 Primary osteoarthritis, unspecified site: Secondary | ICD-10-CM | POA: Diagnosis not present

## 2016-12-07 DIAGNOSIS — E1129 Type 2 diabetes mellitus with other diabetic kidney complication: Secondary | ICD-10-CM | POA: Diagnosis not present

## 2016-12-07 DIAGNOSIS — E782 Mixed hyperlipidemia: Secondary | ICD-10-CM | POA: Diagnosis not present

## 2016-12-07 DIAGNOSIS — E6609 Other obesity due to excess calories: Secondary | ICD-10-CM | POA: Diagnosis not present

## 2016-12-10 ENCOUNTER — Encounter (HOSPITAL_COMMUNITY): Payer: Self-pay

## 2016-12-10 ENCOUNTER — Ambulatory Visit (HOSPITAL_COMMUNITY): Payer: Medicare Other | Admitting: Physical Therapy

## 2016-12-11 ENCOUNTER — Encounter (HOSPITAL_COMMUNITY): Payer: Self-pay | Admitting: Physical Therapy

## 2016-12-11 ENCOUNTER — Ambulatory Visit (HOSPITAL_COMMUNITY): Payer: Medicare Other | Admitting: Physical Therapy

## 2016-12-11 DIAGNOSIS — M25571 Pain in right ankle and joints of right foot: Secondary | ICD-10-CM

## 2016-12-11 DIAGNOSIS — R600 Localized edema: Secondary | ICD-10-CM | POA: Diagnosis not present

## 2016-12-11 DIAGNOSIS — R262 Difficulty in walking, not elsewhere classified: Secondary | ICD-10-CM | POA: Diagnosis not present

## 2016-12-11 DIAGNOSIS — M25674 Stiffness of right foot, not elsewhere classified: Secondary | ICD-10-CM

## 2016-12-11 DIAGNOSIS — M25671 Stiffness of right ankle, not elsewhere classified: Secondary | ICD-10-CM | POA: Diagnosis not present

## 2016-12-11 DIAGNOSIS — E6609 Other obesity due to excess calories: Secondary | ICD-10-CM | POA: Diagnosis not present

## 2016-12-11 DIAGNOSIS — Z0000 Encounter for general adult medical examination without abnormal findings: Secondary | ICD-10-CM | POA: Diagnosis not present

## 2016-12-11 DIAGNOSIS — Z6831 Body mass index (BMI) 31.0-31.9, adult: Secondary | ICD-10-CM | POA: Diagnosis not present

## 2016-12-11 NOTE — Patient Instructions (Signed)
LIGHT WEIGHT BEARING  In sitting, cross your left leg over your right. Gently and slowly rock back and forth to put weight down through your right foot as much as tolerated.  You should be out of the boot for this exercise.  Repeat 10-15 times, twice a day.
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
## 2016-12-13 ENCOUNTER — Encounter (HOSPITAL_COMMUNITY): Payer: Self-pay | Admitting: Physical Therapy

## 2016-12-13 ENCOUNTER — Encounter (HOSPITAL_COMMUNITY): Payer: Medicare Other | Admitting: Physical Therapy

## 2016-12-13 ENCOUNTER — Ambulatory Visit (HOSPITAL_COMMUNITY): Payer: Medicare Other | Admitting: Physical Therapy

## 2016-12-13 DIAGNOSIS — R262 Difficulty in walking, not elsewhere classified: Secondary | ICD-10-CM

## 2016-12-13 DIAGNOSIS — M25571 Pain in right ankle and joints of right foot: Secondary | ICD-10-CM | POA: Diagnosis not present

## 2016-12-13 DIAGNOSIS — M25671 Stiffness of right ankle, not elsewhere classified: Secondary | ICD-10-CM

## 2016-12-13 DIAGNOSIS — R600 Localized edema: Secondary | ICD-10-CM | POA: Diagnosis not present

## 2016-12-13 DIAGNOSIS — M25674 Stiffness of right foot, not elsewhere classified: Secondary | ICD-10-CM
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
## 2016-12-17 ENCOUNTER — Encounter (HOSPITAL_COMMUNITY): Payer: Medicare Other | Admitting: Physical Therapy

## 2016-12-18 ENCOUNTER — Encounter: Payer: Self-pay | Admitting: Physician Assistant

## 2016-12-18 ENCOUNTER — Ambulatory Visit (INDEPENDENT_AMBULATORY_CARE_PROVIDER_SITE_OTHER): Payer: Medicare Other | Admitting: Physician Assistant

## 2016-12-18 ENCOUNTER — Encounter (HOSPITAL_COMMUNITY): Payer: Self-pay

## 2016-12-18 ENCOUNTER — Ambulatory Visit (HOSPITAL_COMMUNITY): Payer: Medicare Other

## 2016-12-18 VITALS — BP 140/70 | HR 79 | Ht 70.0 in | Wt 212.2 lb

## 2016-12-18 DIAGNOSIS — M25671 Stiffness of right ankle, not elsewhere classified: Secondary | ICD-10-CM | POA: Diagnosis not present

## 2016-12-18 DIAGNOSIS — M25571 Pain in right ankle and joints of right foot: Secondary | ICD-10-CM

## 2016-12-18 DIAGNOSIS — R0609 Other forms of dyspnea: Secondary | ICD-10-CM

## 2016-12-18 DIAGNOSIS — R262 Difficulty in walking, not elsewhere classified: Secondary | ICD-10-CM

## 2016-12-18 DIAGNOSIS — I10 Essential (primary) hypertension: Secondary | ICD-10-CM | POA: Diagnosis not present

## 2016-12-18 DIAGNOSIS — R079 Chest pain, unspecified: Secondary | ICD-10-CM | POA: Diagnosis not present

## 2016-12-18 DIAGNOSIS — R600 Localized edema: Secondary | ICD-10-CM

## 2016-12-18 DIAGNOSIS — M25674 Stiffness of right foot, not elsewhere classified: Secondary | ICD-10-CM
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
## 2016-12-18 NOTE — Patient Instructions (Signed)
Medication Instructions:  Your physician recommends that you continue on your current medications as directed. Please refer to the Current Medication list given to you today.  Labwork: None   Testing/Procedures: Your physician has requested that you have a lexiscan myoview. For further information please visit [email or web address]. Please follow instruction sheet, as given.  Follow-Up: Your physician recommends that you schedule a follow-up appointment in: first available with Dr Sallyanne Kuster after testing  Will move appointment up sooner if test results come back abnormal  Any Other Special Instructions Will Be Listed Below (If Applicable).  If you need a refill on your cardiac medications before your next appointment, please call your pharmacy.
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
## 2016-12-20 ENCOUNTER — Encounter (HOSPITAL_COMMUNITY): Payer: Medicare Other | Admitting: Physical Therapy

## 2016-12-20 ENCOUNTER — Ambulatory Visit (HOSPITAL_COMMUNITY): Payer: Medicare Other

## 2016-12-20 ENCOUNTER — Encounter (HOSPITAL_COMMUNITY): Payer: Self-pay

## 2016-12-20 DIAGNOSIS — M25671 Stiffness of right ankle, not elsewhere classified: Secondary | ICD-10-CM

## 2016-12-20 DIAGNOSIS — M25571 Pain in right ankle and joints of right foot: Secondary | ICD-10-CM | POA: Diagnosis not present

## 2016-12-20 DIAGNOSIS — R600 Localized edema: Secondary | ICD-10-CM | POA: Diagnosis not present

## 2016-12-20 DIAGNOSIS — R262 Difficulty in walking, not elsewhere classified: Secondary | ICD-10-CM | POA: Diagnosis not present

## 2016-12-20 DIAGNOSIS — M25674 Stiffness of right foot, not elsewhere classified: Secondary | ICD-10-CM | POA: Diagnosis not present
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
## 2016-12-24 ENCOUNTER — Encounter (HOSPITAL_COMMUNITY): Payer: Medicare Other | Admitting: Physical Therapy

## 2016-12-25 ENCOUNTER — Ambulatory Visit (HOSPITAL_COMMUNITY): Payer: Medicare Other | Admitting: Physical Therapy

## 2016-12-25 ENCOUNTER — Telehealth (HOSPITAL_COMMUNITY): Payer: Self-pay

## 2016-12-25 ENCOUNTER — Encounter (HOSPITAL_COMMUNITY): Payer: Self-pay | Admitting: Physical Therapy

## 2016-12-25 DIAGNOSIS — R600 Localized edema: Secondary | ICD-10-CM

## 2016-12-25 DIAGNOSIS — M25571 Pain in right ankle and joints of right foot: Secondary | ICD-10-CM | POA: Diagnosis not present

## 2016-12-25 DIAGNOSIS — R262 Difficulty in walking, not elsewhere classified: Secondary | ICD-10-CM | POA: Diagnosis not present

## 2016-12-25 DIAGNOSIS — M25671 Stiffness of right ankle, not elsewhere classified: Secondary | ICD-10-CM | POA: Diagnosis not present

## 2016-12-25 DIAGNOSIS — M79671 Pain in right foot: Secondary | ICD-10-CM | POA: Diagnosis not present

## 2016-12-25 DIAGNOSIS — M25674 Stiffness of right foot, not elsewhere classified: Secondary | ICD-10-CM

## 2016-12-25 DIAGNOSIS — M76821 Posterior tibial tendinitis, right leg: Secondary | ICD-10-CM | POA: Diagnosis not present
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
## 2016-12-25 NOTE — Telephone Encounter (Signed)
Encounter complete. 

## 2016-12-26 ENCOUNTER — Ambulatory Visit (HOSPITAL_COMMUNITY)
Admission: RE | Admit: 2016-12-26 | Discharge: 2016-12-26 | Disposition: A | Discharge status: Home / Self Care | Payer: Medicare Other | Source: Ambulatory Visit | Attending: Cardiology | Admitting: Cardiology

## 2016-12-26 DIAGNOSIS — R079 Chest pain, unspecified: Secondary | ICD-10-CM | POA: Diagnosis not present

## 2016-12-26 DIAGNOSIS — J449 Chronic obstructive pulmonary disease, unspecified: Secondary | ICD-10-CM | POA: Insufficient documentation

## 2016-12-26 DIAGNOSIS — I2510 Atherosclerotic heart disease of native coronary artery without angina pectoris: Secondary | ICD-10-CM | POA: Diagnosis not present

## 2016-12-26 DIAGNOSIS — Z86718 Personal history of other venous thrombosis and embolism: Secondary | ICD-10-CM | POA: Diagnosis not present

## 2016-12-26 DIAGNOSIS — F17200 Nicotine dependence, unspecified, uncomplicated: Secondary | ICD-10-CM | POA: Insufficient documentation

## 2016-12-26 DIAGNOSIS — R0609 Other forms of dyspnea: Secondary | ICD-10-CM

## 2016-12-26 DIAGNOSIS — I779 Disorder of arteries and arterioles, unspecified: Secondary | ICD-10-CM | POA: Insufficient documentation

## 2016-12-26 DIAGNOSIS — I10 Essential (primary) hypertension: Secondary | ICD-10-CM | POA: Diagnosis not present

## 2016-12-26 LAB — MYOCARDIAL PERFUSION IMAGING
CHL CUP NUCLEAR SRS: 0
CSEPPHR: 102 {beats}/min
LV dias vol: 128 mL (ref 46–106)
LV sys vol: 60 mL
Rest HR: 79 {beats}/min
SDS: 0
SSS: 0
TID: 1.09

## 2016-12-26 MED ORDER — REGADENOSON 0.4 MG/5ML IV SOLN
0.4000 mg | Freq: Once | INTRAVENOUS | Status: AC
  Administered 2016-12-26: 0.4 mg via INTRAVENOUS

## 2016-12-26 MED ORDER — TECHNETIUM TC 99M TETROFOSMIN IV KIT
29.2000 | PACK | Freq: Once | INTRAVENOUS | Status: AC | PRN
  Administered 2016-12-26: 29.2 via INTRAVENOUS
  Filled 2016-12-26: qty 30

## 2016-12-26 MED ORDER — TECHNETIUM TC 99M TETROFOSMIN IV KIT
10.7000 | PACK | Freq: Once | INTRAVENOUS | Status: AC | PRN
  Administered 2016-12-26: 10.7 via INTRAVENOUS
  Filled 2016-12-26: qty 11

## 2016-12-27 ENCOUNTER — Encounter (HOSPITAL_COMMUNITY): Payer: Medicare Other | Admitting: Physical Therapy

## 2016-12-28 ENCOUNTER — Encounter (HOSPITAL_COMMUNITY): Payer: Self-pay

## 2016-12-28 ENCOUNTER — Ambulatory Visit (HOSPITAL_COMMUNITY): Payer: Medicare Other

## 2016-12-28 DIAGNOSIS — M25671 Stiffness of right ankle, not elsewhere classified: Secondary | ICD-10-CM

## 2016-12-28 DIAGNOSIS — R600 Localized edema: Secondary | ICD-10-CM | POA: Diagnosis not present

## 2016-12-28 DIAGNOSIS — R262 Difficulty in walking, not elsewhere classified: Secondary | ICD-10-CM | POA: Diagnosis not present

## 2016-12-28 DIAGNOSIS — M25571 Pain in right ankle and joints of right foot: Secondary | ICD-10-CM

## 2016-12-28 DIAGNOSIS — M25674 Stiffness of right foot, not elsewhere classified: Secondary | ICD-10-CM
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
## 2017-01-01 ENCOUNTER — Ambulatory Visit (HOSPITAL_COMMUNITY): Payer: Medicare Other | Attending: Podiatry | Admitting: Physical Therapy

## 2017-01-01 ENCOUNTER — Encounter (HOSPITAL_COMMUNITY): Payer: Self-pay | Admitting: Physical Therapy

## 2017-01-01 DIAGNOSIS — M25571 Pain in right ankle and joints of right foot: Secondary | ICD-10-CM | POA: Insufficient documentation

## 2017-01-01 DIAGNOSIS — R262 Difficulty in walking, not elsewhere classified: Secondary | ICD-10-CM | POA: Insufficient documentation

## 2017-01-01 DIAGNOSIS — R600 Localized edema: Secondary | ICD-10-CM | POA: Diagnosis not present

## 2017-01-01 DIAGNOSIS — M25671 Stiffness of right ankle, not elsewhere classified: Secondary | ICD-10-CM | POA: Diagnosis not present

## 2017-01-01 DIAGNOSIS — M25674 Stiffness of right foot, not elsewhere classified: Secondary | ICD-10-CM
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
## 2017-01-03 ENCOUNTER — Ambulatory Visit (INDEPENDENT_AMBULATORY_CARE_PROVIDER_SITE_OTHER): Payer: Medicare Other | Admitting: Physician Assistant

## 2017-01-03 ENCOUNTER — Encounter (HOSPITAL_COMMUNITY): Payer: Self-pay | Admitting: Physical Therapy

## 2017-01-03 ENCOUNTER — Encounter: Payer: Self-pay | Admitting: Physician Assistant

## 2017-01-03 ENCOUNTER — Ambulatory Visit (HOSPITAL_COMMUNITY): Payer: Medicare Other | Admitting: Physical Therapy

## 2017-01-03 VITALS — BP 134/72 | HR 80 | Ht 70.0 in | Wt 209.2 lb

## 2017-01-03 DIAGNOSIS — R600 Localized edema: Secondary | ICD-10-CM

## 2017-01-03 DIAGNOSIS — R0609 Other forms of dyspnea: Secondary | ICD-10-CM | POA: Diagnosis not present

## 2017-01-03 DIAGNOSIS — R262 Difficulty in walking, not elsewhere classified: Secondary | ICD-10-CM | POA: Diagnosis not present

## 2017-01-03 DIAGNOSIS — M25674 Stiffness of right foot, not elsewhere classified: Secondary | ICD-10-CM

## 2017-01-03 DIAGNOSIS — M25671 Stiffness of right ankle, not elsewhere classified: Secondary | ICD-10-CM

## 2017-01-03 DIAGNOSIS — R0989 Other specified symptoms and signs involving the circulatory and respiratory systems: Secondary | ICD-10-CM

## 2017-01-03 DIAGNOSIS — M25571 Pain in right ankle and joints of right foot: Secondary | ICD-10-CM
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
## 2017-01-03 NOTE — Patient Instructions (Signed)
   ELASTIC BAND DORSIFLEXION - SUPINE  You can perform this lying on the floor face-up. Anchor one end of the elastic band in a door (tie a knot in the band and close a door on the band so that the knot is on the other side of the door).   Scoot back until there is tension on the band. Once there is some tension, move your ankle so that your toes and foot pull back and upwards towards pointing to the ceiling. Return to starting position and repeat.    Repeat 10-15 times with red band, 1-2 times per day.     Ankle Plantarflexion with Theraband  Begin in long sitting position with legs supported. Tie one end of the theraband around the foot and hold the other end in your hand. Press your foot forward as if you were pressing a pedal. Slowly allow the foot to return to starting position.   Repeat 10-15 times, 1-2 times per day with red band.      Ankle Inversion with Theraband  Pt seated, loops theraband around foot. Pull the top band away from body and bottom band towards you. Pt brings foot in toward midlin moving from the ankle.  Repeat 10-15 times with yellow band, 1-2 times per day.    Ankle Eversion with Theraband  Patient is seated with knees bent or straight. Loop the theraband around foot with top band going away from affected side. Pull foot away from the middle of your body- be careful not to rotate your foot.   Repeat 10-15 times with red band, 1-2 times per day.    Arch Doming  dome your arch by bringing the front of your foot and heel towards each other.  It should feel like you are raising the arch of your foot while keeping your heel and ball of foot on the floor.  Repeat 10-15 times, 1-2 times per day.

## 2017-01-03 NOTE — Patient Instructions (Signed)
Medication Instructions: Your physician recommends that you continue on your current medications as directed. Please refer to the Current Medication list given to you today.  Labwork: Please have your most recent lab work faxed to [place] at [phone], Attention: Rosaria Ferries, PA   Follow-Up: Your physician wants you to follow-up in: 1 year with Dr. Sallyanne Kuster. You will receive a reminder letter in the mail two months in advance. If you don't receive a letter, please call our office to schedule the follow-up appointment.  If you need a refill on your cardiac medications before your next appointment, please call your pharmacy.

## 2017-01-08 ENCOUNTER — Ambulatory Visit (HOSPITAL_COMMUNITY): Payer: Medicare Other

## 2017-01-08 ENCOUNTER — Encounter (HOSPITAL_COMMUNITY): Payer: Self-pay

## 2017-01-08 DIAGNOSIS — M25571 Pain in right ankle and joints of right foot: Secondary | ICD-10-CM

## 2017-01-08 DIAGNOSIS — R262 Difficulty in walking, not elsewhere classified: Secondary | ICD-10-CM

## 2017-01-08 DIAGNOSIS — B351 Tinea unguium: Secondary | ICD-10-CM | POA: Diagnosis not present

## 2017-01-08 DIAGNOSIS — M25671 Stiffness of right ankle, not elsewhere classified: Secondary | ICD-10-CM

## 2017-01-08 DIAGNOSIS — M25674 Stiffness of right foot, not elsewhere classified: Secondary | ICD-10-CM

## 2017-01-08 DIAGNOSIS — E1142 Type 2 diabetes mellitus with diabetic polyneuropathy: Secondary | ICD-10-CM | POA: Diagnosis not present

## 2017-01-08 DIAGNOSIS — L851 Acquired keratosis [keratoderma] palmaris et plantaris: Secondary | ICD-10-CM | POA: Diagnosis not present

## 2017-01-08 DIAGNOSIS — R600 Localized edema: Secondary | ICD-10-CM | POA: Diagnosis not present
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
## 2017-01-10 ENCOUNTER — Telehealth (HOSPITAL_COMMUNITY): Payer: Self-pay | Admitting: Physical Therapy

## 2017-01-10 ENCOUNTER — Ambulatory Visit (HOSPITAL_COMMUNITY): Payer: Medicare Other | Admitting: Physical Therapy

## 2017-01-10 NOTE — Telephone Encounter (Signed)
Patient left a voice message to cancel her appt for today

## 2017-01-15 ENCOUNTER — Ambulatory Visit (HOSPITAL_COMMUNITY): Payer: Medicare Other | Admitting: Physical Therapy

## 2017-01-15 ENCOUNTER — Encounter (HOSPITAL_COMMUNITY): Payer: Self-pay | Admitting: Physical Therapy

## 2017-01-15 DIAGNOSIS — M25671 Stiffness of right ankle, not elsewhere classified: Secondary | ICD-10-CM | POA: Diagnosis not present

## 2017-01-15 DIAGNOSIS — R262 Difficulty in walking, not elsewhere classified: Secondary | ICD-10-CM | POA: Diagnosis not present

## 2017-01-15 DIAGNOSIS — R600 Localized edema: Secondary | ICD-10-CM | POA: Diagnosis not present

## 2017-01-15 DIAGNOSIS — M25674 Stiffness of right foot, not elsewhere classified: Secondary | ICD-10-CM | POA: Diagnosis not present

## 2017-01-15 DIAGNOSIS — N261 Atrophy of kidney (terminal): Secondary | ICD-10-CM | POA: Diagnosis not present

## 2017-01-15 DIAGNOSIS — Z23 Encounter for immunization: Secondary | ICD-10-CM | POA: Diagnosis not present

## 2017-01-15 DIAGNOSIS — M25571 Pain in right ankle and joints of right foot: Secondary | ICD-10-CM | POA: Diagnosis not present

## 2017-01-15 DIAGNOSIS — N281 Cyst of kidney, acquired: Secondary | ICD-10-CM | POA: Diagnosis not present
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
## 2017-01-15 NOTE — Patient Instructions (Signed)
   Anterior Tibialis Stretch  Cross your leg over your lap and pull your toes down.  You should feel a stretch on the front of your ankle.  Hold for 30 seconds, repeat 2-3 times, twice a day.     BRIDGING  While lying on your back with knees bent, tighten your lower abdominals, squeeze your buttocks and then raise your buttocks off the floor/bed as creating a "Bridge" with your body.   Repeat 10 times, 1-2 times per day.     HIP ABDUCTION - SIDELYING  While lying on your side, slowly raise up your top leg to the side. Keep your knee straight and maintain your toes pointed forward the entire time. Keep your leg in-line with your body.  The bottom leg can be bent to stabilize your body.  Repeat 10 times each side, 1-2 times per day.

## 2017-01-17 ENCOUNTER — Ambulatory Visit (HOSPITAL_COMMUNITY): Payer: Medicare Other

## 2017-01-17 DIAGNOSIS — M25571 Pain in right ankle and joints of right foot: Secondary | ICD-10-CM

## 2017-01-17 DIAGNOSIS — M25671 Stiffness of right ankle, not elsewhere classified: Secondary | ICD-10-CM

## 2017-01-17 DIAGNOSIS — R600 Localized edema: Secondary | ICD-10-CM | POA: Diagnosis not present

## 2017-01-17 DIAGNOSIS — M25674 Stiffness of right foot, not elsewhere classified: Secondary | ICD-10-CM

## 2017-01-17 DIAGNOSIS — R262 Difficulty in walking, not elsewhere classified: Secondary | ICD-10-CM | POA: Diagnosis not present
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
## 2017-01-22 ENCOUNTER — Ambulatory Visit (HOSPITAL_COMMUNITY): Payer: Medicare Other | Admitting: Physical Therapy

## 2017-01-22 ENCOUNTER — Encounter (HOSPITAL_COMMUNITY): Payer: Self-pay | Admitting: Physical Therapy

## 2017-01-22 DIAGNOSIS — M25671 Stiffness of right ankle, not elsewhere classified: Secondary | ICD-10-CM

## 2017-01-22 DIAGNOSIS — R600 Localized edema: Secondary | ICD-10-CM | POA: Diagnosis not present

## 2017-01-22 DIAGNOSIS — M25674 Stiffness of right foot, not elsewhere classified: Secondary | ICD-10-CM | POA: Diagnosis not present

## 2017-01-22 DIAGNOSIS — R262 Difficulty in walking, not elsewhere classified: Secondary | ICD-10-CM

## 2017-01-22 DIAGNOSIS — M25571 Pain in right ankle and joints of right foot: Secondary | ICD-10-CM | POA: Diagnosis not present
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
## 2017-01-24 ENCOUNTER — Ambulatory Visit (HOSPITAL_COMMUNITY): Payer: Medicare Other | Admitting: Physical Therapy

## 2017-01-24 ENCOUNTER — Encounter (HOSPITAL_COMMUNITY): Payer: Self-pay | Admitting: Physical Therapy

## 2017-01-24 DIAGNOSIS — M25674 Stiffness of right foot, not elsewhere classified: Secondary | ICD-10-CM

## 2017-01-24 DIAGNOSIS — R600 Localized edema: Secondary | ICD-10-CM | POA: Diagnosis not present

## 2017-01-24 DIAGNOSIS — R262 Difficulty in walking, not elsewhere classified: Secondary | ICD-10-CM | POA: Diagnosis not present

## 2017-01-24 DIAGNOSIS — M25571 Pain in right ankle and joints of right foot: Secondary | ICD-10-CM

## 2017-01-24 DIAGNOSIS — M25671 Stiffness of right ankle, not elsewhere classified: Secondary | ICD-10-CM | POA: Diagnosis not present
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
## 2017-01-29 ENCOUNTER — Ambulatory Visit (HOSPITAL_COMMUNITY): Payer: Medicare Other | Admitting: Physical Therapy

## 2017-01-29 ENCOUNTER — Encounter (HOSPITAL_COMMUNITY): Payer: Self-pay | Admitting: Physical Therapy

## 2017-01-29 DIAGNOSIS — R262 Difficulty in walking, not elsewhere classified: Secondary | ICD-10-CM

## 2017-01-29 DIAGNOSIS — M25674 Stiffness of right foot, not elsewhere classified: Secondary | ICD-10-CM | POA: Diagnosis not present

## 2017-01-29 DIAGNOSIS — M25571 Pain in right ankle and joints of right foot: Secondary | ICD-10-CM

## 2017-01-29 DIAGNOSIS — M25671 Stiffness of right ankle, not elsewhere classified: Secondary | ICD-10-CM

## 2017-01-29 DIAGNOSIS — R600 Localized edema: Secondary | ICD-10-CM | POA: Diagnosis not present
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
## 2017-01-31 ENCOUNTER — Ambulatory Visit (HOSPITAL_COMMUNITY): Payer: Medicare Other | Attending: Podiatry

## 2017-01-31 ENCOUNTER — Encounter (HOSPITAL_COMMUNITY): Payer: Self-pay

## 2017-01-31 DIAGNOSIS — R600 Localized edema: Secondary | ICD-10-CM | POA: Diagnosis not present

## 2017-01-31 DIAGNOSIS — R262 Difficulty in walking, not elsewhere classified: Secondary | ICD-10-CM | POA: Insufficient documentation

## 2017-01-31 DIAGNOSIS — M25671 Stiffness of right ankle, not elsewhere classified: Secondary | ICD-10-CM | POA: Diagnosis not present

## 2017-01-31 DIAGNOSIS — M25674 Stiffness of right foot, not elsewhere classified: Secondary | ICD-10-CM

## 2017-01-31 DIAGNOSIS — M25571 Pain in right ankle and joints of right foot: Secondary | ICD-10-CM | POA: Diagnosis not present
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
## 2017-02-04 DIAGNOSIS — M76821 Posterior tibial tendinitis, right leg: Secondary | ICD-10-CM | POA: Diagnosis not present

## 2017-02-05 ENCOUNTER — Ambulatory Visit (HOSPITAL_COMMUNITY): Payer: Medicare Other

## 2017-02-05 ENCOUNTER — Encounter (HOSPITAL_COMMUNITY): Payer: Self-pay

## 2017-02-05 DIAGNOSIS — M25674 Stiffness of right foot, not elsewhere classified: Secondary | ICD-10-CM | POA: Diagnosis not present

## 2017-02-05 DIAGNOSIS — R262 Difficulty in walking, not elsewhere classified: Secondary | ICD-10-CM

## 2017-02-05 DIAGNOSIS — M25571 Pain in right ankle and joints of right foot: Secondary | ICD-10-CM

## 2017-02-05 DIAGNOSIS — R600 Localized edema: Secondary | ICD-10-CM | POA: Diagnosis not present

## 2017-02-05 DIAGNOSIS — M25671 Stiffness of right ankle, not elsewhere classified: Secondary | ICD-10-CM | POA: Diagnosis not present
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
## 2017-02-08 ENCOUNTER — Ambulatory Visit (HOSPITAL_COMMUNITY): Payer: Medicare Other

## 2017-02-08 ENCOUNTER — Encounter (HOSPITAL_COMMUNITY): Payer: Self-pay

## 2017-02-08 DIAGNOSIS — R262 Difficulty in walking, not elsewhere classified: Secondary | ICD-10-CM

## 2017-02-08 DIAGNOSIS — R600 Localized edema: Secondary | ICD-10-CM

## 2017-02-08 DIAGNOSIS — M25674 Stiffness of right foot, not elsewhere classified: Secondary | ICD-10-CM

## 2017-02-08 DIAGNOSIS — M25671 Stiffness of right ankle, not elsewhere classified: Secondary | ICD-10-CM | POA: Diagnosis not present

## 2017-02-08 DIAGNOSIS — M25571 Pain in right ankle and joints of right foot: Secondary | ICD-10-CM
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
## 2017-02-12 ENCOUNTER — Encounter (HOSPITAL_COMMUNITY): Payer: Self-pay

## 2017-02-12 ENCOUNTER — Ambulatory Visit (HOSPITAL_COMMUNITY): Payer: Medicare Other

## 2017-02-12 DIAGNOSIS — M25671 Stiffness of right ankle, not elsewhere classified: Secondary | ICD-10-CM

## 2017-02-12 DIAGNOSIS — R262 Difficulty in walking, not elsewhere classified: Secondary | ICD-10-CM

## 2017-02-12 DIAGNOSIS — M25674 Stiffness of right foot, not elsewhere classified: Secondary | ICD-10-CM | POA: Diagnosis not present

## 2017-02-12 DIAGNOSIS — M25571 Pain in right ankle and joints of right foot: Secondary | ICD-10-CM | POA: Diagnosis not present

## 2017-02-12 DIAGNOSIS — R600 Localized edema: Secondary | ICD-10-CM

## 2017-02-12 NOTE — Patient Instructions (Signed)
Heel Raise (Calf Strength / Balance)    Stand with support.. Breathe in. Rise up on tiptoes, breathing out through pursed lips. Hold position to count of 5"  Return slowly, breathing in. Repeat 15 times per session. Do 1-2 sessions per day. Variation: Do without weights.  Copyright  VHI. All rights reserved.   Single Leg Balance: Eyes Open    Stand on right leg with eyes open. Hold 30 seconds. 5 reps ___ times per day.  http://ggbe.exer.us/5   Copyright  VHI. All rights reserved.   Bridge    Lie back, legs bent. Inhale, pressing hips up. Keeping ribs in, lengthen lower back. Exhale, rolling down along spine from top. Repeat 10-15 times. Do 1-2 sessions per day.  http://pm.exer.us/55   Copyright  VHI. All rights reserved.   Calf Stretch    With towel around forefoot, keep knee straight and pull back on towel until a stretch is felt in the calf. Hold 30 seconds. Repeat 3 times. Do 2 sessions per day.  Copyright  VHI. All rights reserved.   Inversion (Eccentric), (Resistance Band)    Pull foot in against resistance band. Slowly release for 3-5 seconds. Use red resistance band. 10 reps per set, 1-2 sets per day  http://ecce.exer.us/5   Copyright  VHI. All rights reserved.
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
## 2017-03-04 DIAGNOSIS — M76821 Posterior tibial tendinitis, right leg: Secondary | ICD-10-CM | POA: Diagnosis not present

## 2017-03-19 DIAGNOSIS — L851 Acquired keratosis [keratoderma] palmaris et plantaris: Secondary | ICD-10-CM | POA: Diagnosis not present

## 2017-03-19 DIAGNOSIS — B351 Tinea unguium: Secondary | ICD-10-CM | POA: Diagnosis not present

## 2017-03-19 DIAGNOSIS — E1142 Type 2 diabetes mellitus with diabetic polyneuropathy: Secondary | ICD-10-CM | POA: Diagnosis not present

## 2017-04-12 DIAGNOSIS — E1129 Type 2 diabetes mellitus with other diabetic kidney complication: Secondary | ICD-10-CM | POA: Diagnosis not present

## 2017-04-12 DIAGNOSIS — E782 Mixed hyperlipidemia: Secondary | ICD-10-CM | POA: Diagnosis not present

## 2017-04-12 DIAGNOSIS — Z1389 Encounter for screening for other disorder: Secondary | ICD-10-CM | POA: Diagnosis not present

## 2017-04-12 DIAGNOSIS — I82409 Acute embolism and thrombosis of unspecified deep veins of unspecified lower extremity: Secondary | ICD-10-CM | POA: Diagnosis not present

## 2017-04-12 DIAGNOSIS — N39490 Overflow incontinence: Secondary | ICD-10-CM | POA: Diagnosis not present

## 2017-04-12 DIAGNOSIS — I10 Essential (primary) hypertension: Secondary | ICD-10-CM | POA: Diagnosis not present

## 2017-04-12 DIAGNOSIS — E119 Type 2 diabetes mellitus without complications: Secondary | ICD-10-CM | POA: Diagnosis not present

## 2017-04-12 DIAGNOSIS — M189 Osteoarthritis of first carpometacarpal joint, unspecified: Secondary | ICD-10-CM | POA: Diagnosis not present

## 2017-04-12 DIAGNOSIS — Z6830 Body mass index (BMI) 30.0-30.9, adult: Secondary | ICD-10-CM | POA: Diagnosis not present

## 2017-04-12 DIAGNOSIS — M109 Gout, unspecified: Secondary | ICD-10-CM | POA: Diagnosis not present

## 2017-04-12 DIAGNOSIS — E041 Nontoxic single thyroid nodule: Secondary | ICD-10-CM | POA: Diagnosis not present

## 2017-04-12 DIAGNOSIS — E6609 Other obesity due to excess calories: Secondary | ICD-10-CM | POA: Diagnosis not present

## 2017-05-06 DIAGNOSIS — M76821 Posterior tibial tendinitis, right leg: Secondary | ICD-10-CM | POA: Diagnosis not present

## 2017-05-06 DIAGNOSIS — M25571 Pain in right ankle and joints of right foot: Secondary | ICD-10-CM | POA: Diagnosis not present

## 2017-05-28 DIAGNOSIS — B351 Tinea unguium: Secondary | ICD-10-CM | POA: Diagnosis not present

## 2017-05-28 DIAGNOSIS — E1142 Type 2 diabetes mellitus with diabetic polyneuropathy: Secondary | ICD-10-CM | POA: Diagnosis not present

## 2017-06-12 DIAGNOSIS — M1711 Unilateral primary osteoarthritis, right knee: Secondary | ICD-10-CM | POA: Diagnosis not present

## 2017-06-20 DIAGNOSIS — M1711 Unilateral primary osteoarthritis, right knee: Secondary | ICD-10-CM | POA: Insufficient documentation

## 2017-06-26 DIAGNOSIS — M1711 Unilateral primary osteoarthritis, right knee: Secondary | ICD-10-CM | POA: Diagnosis not present

## 2017-07-05 ENCOUNTER — Other Ambulatory Visit (HOSPITAL_COMMUNITY): Payer: Self-pay | Admitting: Family Medicine

## 2017-07-05 ENCOUNTER — Telehealth: Payer: Self-pay | Admitting: Obstetrics and Gynecology

## 2017-07-05 DIAGNOSIS — Z1231 Encounter for screening mammogram for malignant neoplasm of breast: Secondary | ICD-10-CM

## 2017-07-05 NOTE — Telephone Encounter (Signed)
Spoke to patient and advised her to call radiology as that I was not familiar with how much radiation patients' were exposed to during mammograms.  Advised they do those daily and should be able to give her all the information.  Patient states she has their number and would call.

## 2017-07-09 ENCOUNTER — Encounter: Payer: Self-pay | Admitting: Endocrinology

## 2017-07-09 ENCOUNTER — Ambulatory Visit (INDEPENDENT_AMBULATORY_CARE_PROVIDER_SITE_OTHER): Payer: Medicare Other | Admitting: Endocrinology

## 2017-07-09 VITALS — BP 160/90 | HR 86 | Temp 99.2°F | Wt 213.8 lb

## 2017-07-09 DIAGNOSIS — E042 Nontoxic multinodular goiter: Secondary | ICD-10-CM

## 2017-07-09 NOTE — Patient Instructions (Signed)
Let's recheck the ultrasound.  you will receive a phone call, about a day and time for an appointment. If there is no change, Please come back for a follow-up appointment in 2 years.
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
## 2017-07-10 ENCOUNTER — Ambulatory Visit: Payer: Medicare Other | Admitting: Endocrinology

## 2017-07-18 ENCOUNTER — Other Ambulatory Visit: Payer: Medicare Other

## 2017-07-23 DIAGNOSIS — M1991 Primary osteoarthritis, unspecified site: Secondary | ICD-10-CM | POA: Diagnosis not present

## 2017-07-23 DIAGNOSIS — Z6830 Body mass index (BMI) 30.0-30.9, adult: Secondary | ICD-10-CM | POA: Diagnosis not present

## 2017-07-23 DIAGNOSIS — E782 Mixed hyperlipidemia: Secondary | ICD-10-CM | POA: Diagnosis not present

## 2017-07-23 DIAGNOSIS — M109 Gout, unspecified: Secondary | ICD-10-CM | POA: Diagnosis not present

## 2017-07-23 DIAGNOSIS — I10 Essential (primary) hypertension: Secondary | ICD-10-CM | POA: Diagnosis not present

## 2017-07-23 DIAGNOSIS — E6609 Other obesity due to excess calories: Secondary | ICD-10-CM | POA: Diagnosis not present

## 2017-07-23 DIAGNOSIS — E1129 Type 2 diabetes mellitus with other diabetic kidney complication: Secondary | ICD-10-CM | POA: Diagnosis not present

## 2017-07-23 DIAGNOSIS — Z719 Counseling, unspecified: Secondary | ICD-10-CM | POA: Diagnosis not present

## 2017-07-23 DIAGNOSIS — Z1389 Encounter for screening for other disorder: Secondary | ICD-10-CM | POA: Diagnosis not present

## 2017-07-25 ENCOUNTER — Ambulatory Visit
Admission: RE | Admit: 2017-07-25 | Discharge: 2017-07-25 | Disposition: A | Discharge status: Home / Self Care | Payer: Medicare Other | Source: Ambulatory Visit | Attending: Endocrinology | Admitting: Endocrinology

## 2017-07-25 DIAGNOSIS — E042 Nontoxic multinodular goiter: Secondary | ICD-10-CM | POA: Diagnosis not present

## 2017-07-26 DIAGNOSIS — M1711 Unilateral primary osteoarthritis, right knee: Secondary | ICD-10-CM | POA: Diagnosis not present

## 2017-07-26 DIAGNOSIS — M1712 Unilateral primary osteoarthritis, left knee: Secondary | ICD-10-CM | POA: Insufficient documentation

## 2017-08-01 DIAGNOSIS — M1712 Unilateral primary osteoarthritis, left knee: Secondary | ICD-10-CM | POA: Diagnosis not present

## 2017-08-06 DIAGNOSIS — E1142 Type 2 diabetes mellitus with diabetic polyneuropathy: Secondary | ICD-10-CM | POA: Diagnosis not present

## 2017-08-06 DIAGNOSIS — B351 Tinea unguium: Secondary | ICD-10-CM | POA: Diagnosis not present

## 2017-08-08 ENCOUNTER — Ambulatory Visit (HOSPITAL_COMMUNITY)
Admission: RE | Admit: 2017-08-08 | Discharge: 2017-08-08 | Disposition: A | Discharge status: Home / Self Care | Payer: Medicare Other | Source: Ambulatory Visit | Attending: Family Medicine | Admitting: Family Medicine

## 2017-08-08 DIAGNOSIS — Z1231 Encounter for screening mammogram for malignant neoplasm of breast: Secondary | ICD-10-CM | POA: Insufficient documentation

## 2017-08-08 DIAGNOSIS — M1712 Unilateral primary osteoarthritis, left knee: Secondary | ICD-10-CM | POA: Diagnosis not present

## 2017-10-29 DIAGNOSIS — E1142 Type 2 diabetes mellitus with diabetic polyneuropathy: Secondary | ICD-10-CM | POA: Diagnosis not present

## 2017-10-29 DIAGNOSIS — B351 Tinea unguium: Secondary | ICD-10-CM | POA: Diagnosis not present

## 2018-01-06 DIAGNOSIS — E785 Hyperlipidemia, unspecified: Secondary | ICD-10-CM | POA: Diagnosis not present

## 2018-01-06 DIAGNOSIS — Z1389 Encounter for screening for other disorder: Secondary | ICD-10-CM | POA: Diagnosis not present

## 2018-01-06 DIAGNOSIS — E1129 Type 2 diabetes mellitus with other diabetic kidney complication: Secondary | ICD-10-CM | POA: Diagnosis not present

## 2018-01-06 DIAGNOSIS — E041 Nontoxic single thyroid nodule: Secondary | ICD-10-CM | POA: Diagnosis not present

## 2018-01-06 DIAGNOSIS — Z23 Encounter for immunization: Secondary | ICD-10-CM | POA: Diagnosis not present

## 2018-01-06 DIAGNOSIS — I10 Essential (primary) hypertension: Secondary | ICD-10-CM | POA: Diagnosis not present

## 2018-01-06 DIAGNOSIS — E782 Mixed hyperlipidemia: Secondary | ICD-10-CM | POA: Diagnosis not present

## 2018-01-06 DIAGNOSIS — Z681 Body mass index (BMI) 19 or less, adult: Secondary | ICD-10-CM | POA: Diagnosis not present

## 2018-01-06 DIAGNOSIS — Z0001 Encounter for general adult medical examination with abnormal findings: Secondary | ICD-10-CM | POA: Diagnosis not present

## 2018-01-06 DIAGNOSIS — Z719 Counseling, unspecified: Secondary | ICD-10-CM | POA: Diagnosis not present

## 2018-01-06 DIAGNOSIS — R946 Abnormal results of thyroid function studies: Secondary | ICD-10-CM | POA: Diagnosis not present

## 2018-01-07 DIAGNOSIS — B351 Tinea unguium: Secondary | ICD-10-CM | POA: Diagnosis not present

## 2018-01-07 DIAGNOSIS — Z23 Encounter for immunization: Secondary | ICD-10-CM | POA: Diagnosis not present

## 2018-01-07 DIAGNOSIS — E1142 Type 2 diabetes mellitus with diabetic polyneuropathy: Secondary | ICD-10-CM | POA: Diagnosis not present

## 2018-01-09 ENCOUNTER — Other Ambulatory Visit (HOSPITAL_COMMUNITY): Payer: Self-pay | Admitting: Family Medicine

## 2018-01-09 DIAGNOSIS — F17290 Nicotine dependence, other tobacco product, uncomplicated: Secondary | ICD-10-CM

## 2018-01-09 DIAGNOSIS — Z87891 Personal history of nicotine dependence: Secondary | ICD-10-CM

## 2018-02-11 ENCOUNTER — Ambulatory Visit (HOSPITAL_COMMUNITY)
Admission: RE | Admit: 2018-02-11 | Discharge: 2018-02-11 | Disposition: A | Discharge status: Home / Self Care | Payer: Medicare Other | Source: Ambulatory Visit | Attending: Family Medicine | Admitting: Family Medicine

## 2018-02-11 ENCOUNTER — Encounter (HOSPITAL_COMMUNITY): Payer: Self-pay

## 2018-02-18 DIAGNOSIS — R809 Proteinuria, unspecified: Secondary | ICD-10-CM | POA: Diagnosis not present

## 2018-02-18 DIAGNOSIS — N281 Cyst of kidney, acquired: Secondary | ICD-10-CM | POA: Diagnosis not present

## 2018-02-18 DIAGNOSIS — N261 Atrophy of kidney (terminal): Secondary | ICD-10-CM | POA: Diagnosis not present

## 2018-02-19 ENCOUNTER — Ambulatory Visit (HOSPITAL_COMMUNITY)
Admission: RE | Admit: 2018-02-19 | Discharge: 2018-02-19 | Disposition: A | Discharge status: Home / Self Care | Payer: Medicare Other | Source: Ambulatory Visit | Attending: Family Medicine | Admitting: Family Medicine

## 2018-02-19 DIAGNOSIS — Z87891 Personal history of nicotine dependence: Secondary | ICD-10-CM

## 2018-02-19 DIAGNOSIS — J439 Emphysema, unspecified: Secondary | ICD-10-CM | POA: Diagnosis not present

## 2018-02-19 DIAGNOSIS — I700 Atherosclerosis of aorta: Secondary | ICD-10-CM | POA: Diagnosis not present

## 2018-02-19 DIAGNOSIS — D3502 Benign neoplasm of left adrenal gland: Secondary | ICD-10-CM | POA: Diagnosis not present

## 2018-02-19 DIAGNOSIS — I2510 Atherosclerotic heart disease of native coronary artery without angina pectoris: Secondary | ICD-10-CM | POA: Insufficient documentation

## 2018-02-19 DIAGNOSIS — F17210 Nicotine dependence, cigarettes, uncomplicated: Secondary | ICD-10-CM | POA: Diagnosis not present

## 2018-03-18 DIAGNOSIS — E1142 Type 2 diabetes mellitus with diabetic polyneuropathy: Secondary | ICD-10-CM | POA: Diagnosis not present

## 2018-03-18 DIAGNOSIS — B351 Tinea unguium: Secondary | ICD-10-CM | POA: Diagnosis not present

## 2018-04-10 DIAGNOSIS — E7849 Other hyperlipidemia: Secondary | ICD-10-CM | POA: Diagnosis not present

## 2018-04-10 DIAGNOSIS — I10 Essential (primary) hypertension: Secondary | ICD-10-CM | POA: Diagnosis not present

## 2018-04-10 DIAGNOSIS — E782 Mixed hyperlipidemia: Secondary | ICD-10-CM | POA: Diagnosis not present

## 2018-04-10 DIAGNOSIS — E041 Nontoxic single thyroid nodule: Secondary | ICD-10-CM | POA: Diagnosis not present

## 2018-04-10 DIAGNOSIS — E1129 Type 2 diabetes mellitus with other diabetic kidney complication: Secondary | ICD-10-CM | POA: Diagnosis not present

## 2018-04-10 DIAGNOSIS — Z0000 Encounter for general adult medical examination without abnormal findings: Secondary | ICD-10-CM | POA: Diagnosis not present

## 2018-04-10 DIAGNOSIS — M1991 Primary osteoarthritis, unspecified site: Secondary | ICD-10-CM | POA: Diagnosis not present

## 2018-04-10 DIAGNOSIS — M25561 Pain in right knee: Secondary | ICD-10-CM | POA: Diagnosis not present

## 2018-04-10 DIAGNOSIS — Z1389 Encounter for screening for other disorder: Secondary | ICD-10-CM | POA: Diagnosis not present

## 2018-04-10 DIAGNOSIS — N39490 Overflow incontinence: Secondary | ICD-10-CM | POA: Diagnosis not present

## 2018-04-10 DIAGNOSIS — E6609 Other obesity due to excess calories: Secondary | ICD-10-CM | POA: Diagnosis not present

## 2018-04-10 DIAGNOSIS — Z6830 Body mass index (BMI) 30.0-30.9, adult: Secondary | ICD-10-CM | POA: Diagnosis not present

## 2018-04-22 DIAGNOSIS — E1142 Type 2 diabetes mellitus with diabetic polyneuropathy: Secondary | ICD-10-CM | POA: Diagnosis not present

## 2018-05-27 DIAGNOSIS — Z6830 Body mass index (BMI) 30.0-30.9, adult: Secondary | ICD-10-CM | POA: Diagnosis not present

## 2018-05-27 DIAGNOSIS — J0100 Acute maxillary sinusitis, unspecified: Secondary | ICD-10-CM | POA: Diagnosis not present

## 2018-05-27 DIAGNOSIS — E6609 Other obesity due to excess calories: Secondary | ICD-10-CM | POA: Diagnosis not present

## 2018-05-27 DIAGNOSIS — B351 Tinea unguium: Secondary | ICD-10-CM | POA: Diagnosis not present

## 2018-05-27 DIAGNOSIS — J343 Hypertrophy of nasal turbinates: Secondary | ICD-10-CM | POA: Diagnosis not present

## 2018-05-27 DIAGNOSIS — R05 Cough: Secondary | ICD-10-CM | POA: Diagnosis not present

## 2018-05-27 DIAGNOSIS — E1142 Type 2 diabetes mellitus with diabetic polyneuropathy: Secondary | ICD-10-CM | POA: Diagnosis not present

## 2018-07-23 DIAGNOSIS — M25561 Pain in right knee: Secondary | ICD-10-CM | POA: Diagnosis not present

## 2018-07-23 DIAGNOSIS — Z6830 Body mass index (BMI) 30.0-30.9, adult: Secondary | ICD-10-CM | POA: Diagnosis not present

## 2018-07-23 DIAGNOSIS — M5030 Other cervical disc degeneration, unspecified cervical region: Secondary | ICD-10-CM | POA: Diagnosis not present

## 2018-07-23 DIAGNOSIS — E119 Type 2 diabetes mellitus without complications: Secondary | ICD-10-CM | POA: Diagnosis not present

## 2018-07-23 DIAGNOSIS — I82409 Acute embolism and thrombosis of unspecified deep veins of unspecified lower extremity: Secondary | ICD-10-CM | POA: Diagnosis not present

## 2018-07-23 DIAGNOSIS — I10 Essential (primary) hypertension: Secondary | ICD-10-CM | POA: Diagnosis not present

## 2018-07-23 DIAGNOSIS — Z1389 Encounter for screening for other disorder: Secondary | ICD-10-CM | POA: Diagnosis not present

## 2018-07-23 DIAGNOSIS — M189 Osteoarthritis of first carpometacarpal joint, unspecified: Secondary | ICD-10-CM | POA: Diagnosis not present

## 2018-07-23 DIAGNOSIS — M1120 Other chondrocalcinosis, unspecified site: Secondary | ICD-10-CM | POA: Diagnosis not present

## 2018-07-23 DIAGNOSIS — M1991 Primary osteoarthritis, unspecified site: Secondary | ICD-10-CM | POA: Diagnosis not present

## 2018-07-23 DIAGNOSIS — Z23 Encounter for immunization: Secondary | ICD-10-CM | POA: Diagnosis not present

## 2018-07-23 DIAGNOSIS — E6609 Other obesity due to excess calories: Secondary | ICD-10-CM | POA: Diagnosis not present

## 2018-08-05 DIAGNOSIS — E1142 Type 2 diabetes mellitus with diabetic polyneuropathy: Secondary | ICD-10-CM | POA: Diagnosis not present

## 2018-08-05 DIAGNOSIS — B351 Tinea unguium: Secondary | ICD-10-CM | POA: Diagnosis not present

## 2018-09-05 DIAGNOSIS — M170 Bilateral primary osteoarthritis of knee: Secondary | ICD-10-CM | POA: Diagnosis not present

## 2018-09-05 DIAGNOSIS — M1711 Unilateral primary osteoarthritis, right knee: Secondary | ICD-10-CM | POA: Diagnosis not present

## 2018-09-05 DIAGNOSIS — M1712 Unilateral primary osteoarthritis, left knee: Secondary | ICD-10-CM | POA: Diagnosis not present

## 2018-09-12 DIAGNOSIS — M170 Bilateral primary osteoarthritis of knee: Secondary | ICD-10-CM | POA: Diagnosis not present

## 2018-09-16 ENCOUNTER — Other Ambulatory Visit (HOSPITAL_COMMUNITY): Payer: Self-pay | Admitting: Family Medicine

## 2018-09-16 DIAGNOSIS — Z1231 Encounter for screening mammogram for malignant neoplasm of breast: Secondary | ICD-10-CM

## 2018-09-19 DIAGNOSIS — M170 Bilateral primary osteoarthritis of knee: Secondary | ICD-10-CM | POA: Diagnosis not present

## 2018-09-25 ENCOUNTER — Ambulatory Visit (HOSPITAL_COMMUNITY)
Admission: RE | Admit: 2018-09-25 | Discharge: 2018-09-25 | Disposition: A | Discharge status: Home / Self Care | Payer: Medicare Other | Source: Ambulatory Visit | Attending: Family Medicine | Admitting: Family Medicine

## 2018-09-25 ENCOUNTER — Other Ambulatory Visit: Payer: Self-pay

## 2018-09-25 DIAGNOSIS — Z1231 Encounter for screening mammogram for malignant neoplasm of breast: Secondary | ICD-10-CM | POA: Diagnosis not present

## 2018-10-14 DIAGNOSIS — E1142 Type 2 diabetes mellitus with diabetic polyneuropathy: Secondary | ICD-10-CM | POA: Diagnosis not present

## 2018-10-14 DIAGNOSIS — B351 Tinea unguium: Secondary | ICD-10-CM | POA: Diagnosis not present

## 2018-10-28 ENCOUNTER — Ambulatory Visit (HOSPITAL_COMMUNITY)
Admission: RE | Admit: 2018-10-28 | Discharge: 2018-10-28 | Disposition: A | Discharge status: Home / Self Care | Payer: Medicare Other | Source: Ambulatory Visit | Attending: Family Medicine | Admitting: Family Medicine

## 2018-10-28 ENCOUNTER — Other Ambulatory Visit: Payer: Self-pay

## 2018-10-28 ENCOUNTER — Other Ambulatory Visit (HOSPITAL_COMMUNITY): Payer: Self-pay | Admitting: Family Medicine

## 2018-10-28 DIAGNOSIS — R0781 Pleurodynia: Secondary | ICD-10-CM | POA: Insufficient documentation

## 2018-10-28 DIAGNOSIS — R0989 Other specified symptoms and signs involving the circulatory and respiratory systems: Secondary | ICD-10-CM | POA: Diagnosis not present

## 2018-10-28 DIAGNOSIS — Z6829 Body mass index (BMI) 29.0-29.9, adult: Secondary | ICD-10-CM | POA: Diagnosis not present

## 2018-10-28 DIAGNOSIS — E663 Overweight: Secondary | ICD-10-CM | POA: Diagnosis not present

## 2018-10-28 DIAGNOSIS — Z1389 Encounter for screening for other disorder: Secondary | ICD-10-CM | POA: Diagnosis not present

## 2018-11-24 DIAGNOSIS — N183 Chronic kidney disease, stage 3 (moderate): Secondary | ICD-10-CM | POA: Diagnosis not present

## 2018-11-24 DIAGNOSIS — R350 Frequency of micturition: Secondary | ICD-10-CM | POA: Diagnosis not present

## 2018-11-24 DIAGNOSIS — E663 Overweight: Secondary | ICD-10-CM | POA: Diagnosis not present

## 2018-11-24 DIAGNOSIS — I10 Essential (primary) hypertension: Secondary | ICD-10-CM | POA: Diagnosis not present

## 2018-11-24 DIAGNOSIS — E119 Type 2 diabetes mellitus without complications: Secondary | ICD-10-CM | POA: Diagnosis not present

## 2018-11-24 DIAGNOSIS — Z6829 Body mass index (BMI) 29.0-29.9, adult: Secondary | ICD-10-CM | POA: Diagnosis not present

## 2018-11-24 DIAGNOSIS — E7849 Other hyperlipidemia: Secondary | ICD-10-CM | POA: Diagnosis not present

## 2018-12-16 ENCOUNTER — Other Ambulatory Visit: Payer: Self-pay

## 2018-12-16 DIAGNOSIS — Z20822 Contact with and (suspected) exposure to covid-19: Secondary | ICD-10-CM

## 2018-12-17 LAB — NOVEL CORONAVIRUS, NAA: SARS-CoV-2, NAA: NOT DETECTED

## 2018-12-23 DIAGNOSIS — B351 Tinea unguium: Secondary | ICD-10-CM | POA: Diagnosis not present

## 2018-12-23 DIAGNOSIS — E1142 Type 2 diabetes mellitus with diabetic polyneuropathy: Secondary | ICD-10-CM | POA: Diagnosis not present

## 2019-01-13 DIAGNOSIS — Z23 Encounter for immunization: Secondary | ICD-10-CM | POA: Diagnosis not present

## 2019-01-26 DIAGNOSIS — N184 Chronic kidney disease, stage 4 (severe): Secondary | ICD-10-CM | POA: Insufficient documentation

## 2019-01-26 DIAGNOSIS — N1832 Chronic kidney disease, stage 3b: Secondary | ICD-10-CM | POA: Diagnosis not present

## 2019-01-26 DIAGNOSIS — I129 Hypertensive chronic kidney disease with stage 1 through stage 4 chronic kidney disease, or unspecified chronic kidney disease: Secondary | ICD-10-CM | POA: Insufficient documentation

## 2019-01-26 DIAGNOSIS — E1122 Type 2 diabetes mellitus with diabetic chronic kidney disease: Secondary | ICD-10-CM | POA: Diagnosis not present

## 2019-01-26 DIAGNOSIS — R809 Proteinuria, unspecified: Secondary | ICD-10-CM | POA: Diagnosis not present

## 2019-02-23 DIAGNOSIS — N261 Atrophy of kidney (terminal): Secondary | ICD-10-CM | POA: Diagnosis not present

## 2019-02-23 DIAGNOSIS — N281 Cyst of kidney, acquired: Secondary | ICD-10-CM | POA: Diagnosis not present

## 2019-03-02 DIAGNOSIS — I129 Hypertensive chronic kidney disease with stage 1 through stage 4 chronic kidney disease, or unspecified chronic kidney disease: Secondary | ICD-10-CM | POA: Diagnosis not present

## 2019-03-02 DIAGNOSIS — E7849 Other hyperlipidemia: Secondary | ICD-10-CM | POA: Diagnosis not present

## 2019-03-02 DIAGNOSIS — N1832 Chronic kidney disease, stage 3b: Secondary | ICD-10-CM | POA: Diagnosis not present

## 2019-03-02 DIAGNOSIS — E1122 Type 2 diabetes mellitus with diabetic chronic kidney disease: Secondary | ICD-10-CM | POA: Diagnosis not present

## 2019-03-03 DIAGNOSIS — B351 Tinea unguium: Secondary | ICD-10-CM | POA: Diagnosis not present

## 2019-03-03 DIAGNOSIS — E1142 Type 2 diabetes mellitus with diabetic polyneuropathy: Secondary | ICD-10-CM | POA: Diagnosis not present

## 2019-03-04 DIAGNOSIS — E039 Hypothyroidism, unspecified: Secondary | ICD-10-CM | POA: Diagnosis not present

## 2019-03-04 DIAGNOSIS — Z6829 Body mass index (BMI) 29.0-29.9, adult: Secondary | ICD-10-CM | POA: Diagnosis not present

## 2019-03-04 DIAGNOSIS — Z0001 Encounter for general adult medical examination with abnormal findings: Secondary | ICD-10-CM | POA: Diagnosis not present

## 2019-03-04 DIAGNOSIS — Z1389 Encounter for screening for other disorder: Secondary | ICD-10-CM | POA: Diagnosis not present

## 2019-03-04 DIAGNOSIS — E663 Overweight: Secondary | ICD-10-CM | POA: Diagnosis not present

## 2019-03-04 DIAGNOSIS — E063 Autoimmune thyroiditis: Secondary | ICD-10-CM | POA: Diagnosis not present

## 2019-03-04 DIAGNOSIS — E559 Vitamin D deficiency, unspecified: Secondary | ICD-10-CM | POA: Diagnosis not present

## 2019-03-04 DIAGNOSIS — M5136 Other intervertebral disc degeneration, lumbar region: Secondary | ICD-10-CM | POA: Diagnosis not present

## 2019-03-09 DIAGNOSIS — N1832 Chronic kidney disease, stage 3b: Secondary | ICD-10-CM | POA: Diagnosis not present

## 2019-03-09 DIAGNOSIS — R809 Proteinuria, unspecified: Secondary | ICD-10-CM | POA: Diagnosis not present

## 2019-03-09 DIAGNOSIS — E1122 Type 2 diabetes mellitus with diabetic chronic kidney disease: Secondary | ICD-10-CM | POA: Diagnosis not present

## 2019-03-09 DIAGNOSIS — I129 Hypertensive chronic kidney disease with stage 1 through stage 4 chronic kidney disease, or unspecified chronic kidney disease: Secondary | ICD-10-CM | POA: Diagnosis not present

## 2019-04-14 DIAGNOSIS — Z23 Encounter for immunization: Secondary | ICD-10-CM | POA: Diagnosis not present

## 2019-04-21 DIAGNOSIS — E1122 Type 2 diabetes mellitus with diabetic chronic kidney disease: Secondary | ICD-10-CM | POA: Diagnosis not present

## 2019-04-21 DIAGNOSIS — I129 Hypertensive chronic kidney disease with stage 1 through stage 4 chronic kidney disease, or unspecified chronic kidney disease: Secondary | ICD-10-CM | POA: Diagnosis not present

## 2019-04-21 DIAGNOSIS — R809 Proteinuria, unspecified: Secondary | ICD-10-CM | POA: Diagnosis not present

## 2019-04-21 DIAGNOSIS — N1832 Chronic kidney disease, stage 3b: Secondary | ICD-10-CM | POA: Diagnosis not present

## 2019-05-03 DIAGNOSIS — N1832 Chronic kidney disease, stage 3b: Secondary | ICD-10-CM | POA: Diagnosis not present

## 2019-05-03 DIAGNOSIS — E7849 Other hyperlipidemia: Secondary | ICD-10-CM | POA: Diagnosis not present

## 2019-05-03 DIAGNOSIS — E1122 Type 2 diabetes mellitus with diabetic chronic kidney disease: Secondary | ICD-10-CM | POA: Diagnosis not present

## 2019-05-03 DIAGNOSIS — I129 Hypertensive chronic kidney disease with stage 1 through stage 4 chronic kidney disease, or unspecified chronic kidney disease: Secondary | ICD-10-CM | POA: Diagnosis not present

## 2019-05-12 DIAGNOSIS — B351 Tinea unguium: Secondary | ICD-10-CM | POA: Diagnosis not present

## 2019-05-12 DIAGNOSIS — E1142 Type 2 diabetes mellitus with diabetic polyneuropathy: Secondary | ICD-10-CM | POA: Diagnosis not present

## 2019-05-25 DIAGNOSIS — Z23 Encounter for immunization: Secondary | ICD-10-CM | POA: Diagnosis not present

## 2019-06-24 DIAGNOSIS — M1712 Unilateral primary osteoarthritis, left knee: Secondary | ICD-10-CM | POA: Diagnosis not present

## 2019-06-24 DIAGNOSIS — M170 Bilateral primary osteoarthritis of knee: Secondary | ICD-10-CM | POA: Diagnosis not present

## 2019-06-24 DIAGNOSIS — M1711 Unilateral primary osteoarthritis, right knee: Secondary | ICD-10-CM | POA: Diagnosis not present

## 2019-07-01 DIAGNOSIS — I129 Hypertensive chronic kidney disease with stage 1 through stage 4 chronic kidney disease, or unspecified chronic kidney disease: Secondary | ICD-10-CM | POA: Diagnosis not present

## 2019-07-01 DIAGNOSIS — M170 Bilateral primary osteoarthritis of knee: Secondary | ICD-10-CM | POA: Diagnosis not present

## 2019-07-01 DIAGNOSIS — N1832 Chronic kidney disease, stage 3b: Secondary | ICD-10-CM | POA: Diagnosis not present

## 2019-07-01 DIAGNOSIS — E1122 Type 2 diabetes mellitus with diabetic chronic kidney disease: Secondary | ICD-10-CM | POA: Diagnosis not present

## 2019-07-01 DIAGNOSIS — M1712 Unilateral primary osteoarthritis, left knee: Secondary | ICD-10-CM | POA: Diagnosis not present

## 2019-07-01 DIAGNOSIS — M1711 Unilateral primary osteoarthritis, right knee: Secondary | ICD-10-CM | POA: Diagnosis not present

## 2019-07-08 DIAGNOSIS — M170 Bilateral primary osteoarthritis of knee: Secondary | ICD-10-CM | POA: Diagnosis not present

## 2019-07-08 DIAGNOSIS — M1711 Unilateral primary osteoarthritis, right knee: Secondary | ICD-10-CM | POA: Diagnosis not present

## 2019-07-08 DIAGNOSIS — M1712 Unilateral primary osteoarthritis, left knee: Secondary | ICD-10-CM | POA: Diagnosis not present

## 2019-07-10 DIAGNOSIS — Z6829 Body mass index (BMI) 29.0-29.9, adult: Secondary | ICD-10-CM | POA: Diagnosis not present

## 2019-07-10 DIAGNOSIS — E559 Vitamin D deficiency, unspecified: Secondary | ICD-10-CM | POA: Diagnosis not present

## 2019-07-10 DIAGNOSIS — E1129 Type 2 diabetes mellitus with other diabetic kidney complication: Secondary | ICD-10-CM | POA: Diagnosis not present

## 2019-07-10 DIAGNOSIS — I129 Hypertensive chronic kidney disease with stage 1 through stage 4 chronic kidney disease, or unspecified chronic kidney disease: Secondary | ICD-10-CM | POA: Diagnosis not present

## 2019-07-10 DIAGNOSIS — E663 Overweight: Secondary | ICD-10-CM | POA: Diagnosis not present

## 2019-07-10 DIAGNOSIS — Z1389 Encounter for screening for other disorder: Secondary | ICD-10-CM | POA: Diagnosis not present

## 2019-07-10 DIAGNOSIS — M25561 Pain in right knee: Secondary | ICD-10-CM | POA: Diagnosis not present

## 2019-07-10 DIAGNOSIS — E7849 Other hyperlipidemia: Secondary | ICD-10-CM | POA: Diagnosis not present

## 2019-07-21 DIAGNOSIS — E1142 Type 2 diabetes mellitus with diabetic polyneuropathy: Secondary | ICD-10-CM | POA: Diagnosis not present

## 2019-07-21 DIAGNOSIS — B351 Tinea unguium: Secondary | ICD-10-CM | POA: Diagnosis not present

## 2019-07-22 DIAGNOSIS — E1122 Type 2 diabetes mellitus with diabetic chronic kidney disease: Secondary | ICD-10-CM | POA: Diagnosis not present

## 2019-07-22 DIAGNOSIS — N1832 Chronic kidney disease, stage 3b: Secondary | ICD-10-CM | POA: Diagnosis not present

## 2019-07-22 DIAGNOSIS — R809 Proteinuria, unspecified: Secondary | ICD-10-CM | POA: Diagnosis not present

## 2019-08-31 DIAGNOSIS — N1832 Chronic kidney disease, stage 3b: Secondary | ICD-10-CM | POA: Diagnosis not present

## 2019-08-31 DIAGNOSIS — I129 Hypertensive chronic kidney disease with stage 1 through stage 4 chronic kidney disease, or unspecified chronic kidney disease: Secondary | ICD-10-CM | POA: Diagnosis not present

## 2019-08-31 DIAGNOSIS — E1122 Type 2 diabetes mellitus with diabetic chronic kidney disease: Secondary | ICD-10-CM | POA: Diagnosis not present

## 2019-08-31 DIAGNOSIS — E7849 Other hyperlipidemia: Secondary | ICD-10-CM | POA: Diagnosis not present

## 2019-09-29 ENCOUNTER — Other Ambulatory Visit (HOSPITAL_COMMUNITY): Payer: Self-pay | Admitting: Family Medicine

## 2019-09-29 DIAGNOSIS — Z1231 Encounter for screening mammogram for malignant neoplasm of breast: Secondary | ICD-10-CM

## 2019-10-08 ENCOUNTER — Ambulatory Visit (HOSPITAL_COMMUNITY)
Admission: RE | Admit: 2019-10-08 | Discharge: 2019-10-08 | Disposition: A | Discharge status: Home / Self Care | Payer: Medicare Other | Source: Ambulatory Visit | Attending: Family Medicine | Admitting: Family Medicine

## 2019-10-08 ENCOUNTER — Other Ambulatory Visit: Payer: Self-pay

## 2019-10-08 DIAGNOSIS — Z1231 Encounter for screening mammogram for malignant neoplasm of breast: Secondary | ICD-10-CM | POA: Insufficient documentation

## 2019-10-14 NOTE — Progress Notes (Signed)
Normal mammogram

## 2019-10-19 DIAGNOSIS — N183 Chronic kidney disease, stage 3 unspecified: Secondary | ICD-10-CM | POA: Diagnosis not present

## 2019-10-19 DIAGNOSIS — E041 Nontoxic single thyroid nodule: Secondary | ICD-10-CM | POA: Diagnosis not present

## 2019-10-19 DIAGNOSIS — Z6829 Body mass index (BMI) 29.0-29.9, adult: Secondary | ICD-10-CM | POA: Diagnosis not present

## 2019-10-19 DIAGNOSIS — E1129 Type 2 diabetes mellitus with other diabetic kidney complication: Secondary | ICD-10-CM | POA: Diagnosis not present

## 2019-10-19 DIAGNOSIS — I129 Hypertensive chronic kidney disease with stage 1 through stage 4 chronic kidney disease, or unspecified chronic kidney disease: Secondary | ICD-10-CM | POA: Diagnosis not present

## 2019-10-19 DIAGNOSIS — Z79899 Other long term (current) drug therapy: Secondary | ICD-10-CM | POA: Diagnosis not present

## 2019-10-19 DIAGNOSIS — E663 Overweight: Secondary | ICD-10-CM | POA: Diagnosis not present

## 2019-10-19 DIAGNOSIS — E559 Vitamin D deficiency, unspecified: Secondary | ICD-10-CM | POA: Diagnosis not present

## 2019-10-20 ENCOUNTER — Other Ambulatory Visit (HOSPITAL_COMMUNITY): Payer: Self-pay | Admitting: Family Medicine

## 2019-10-20 DIAGNOSIS — E2839 Other primary ovarian failure: Secondary | ICD-10-CM

## 2019-10-21 ENCOUNTER — Encounter: Payer: Self-pay | Admitting: Obstetrics and Gynecology

## 2019-10-21 ENCOUNTER — Other Ambulatory Visit: Payer: Self-pay

## 2019-10-21 ENCOUNTER — Ambulatory Visit (INDEPENDENT_AMBULATORY_CARE_PROVIDER_SITE_OTHER): Payer: Medicare Other | Admitting: Obstetrics and Gynecology

## 2019-10-21 VITALS — BP 137/79 | HR 79 | Ht 69.0 in | Wt 203.4 lb

## 2019-10-21 DIAGNOSIS — Z01419 Encounter for gynecological examination (general) (routine) without abnormal findings: Secondary | ICD-10-CM | POA: Diagnosis not present
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
## 2019-10-22 DIAGNOSIS — N1832 Chronic kidney disease, stage 3b: Secondary | ICD-10-CM | POA: Diagnosis not present

## 2019-10-22 DIAGNOSIS — R809 Proteinuria, unspecified: Secondary | ICD-10-CM | POA: Diagnosis not present

## 2019-10-22 DIAGNOSIS — E1122 Type 2 diabetes mellitus with diabetic chronic kidney disease: Secondary | ICD-10-CM | POA: Diagnosis not present

## 2019-10-28 DIAGNOSIS — R809 Proteinuria, unspecified: Secondary | ICD-10-CM | POA: Diagnosis not present

## 2019-10-28 DIAGNOSIS — I129 Hypertensive chronic kidney disease with stage 1 through stage 4 chronic kidney disease, or unspecified chronic kidney disease: Secondary | ICD-10-CM | POA: Diagnosis not present

## 2019-10-28 DIAGNOSIS — N1832 Chronic kidney disease, stage 3b: Secondary | ICD-10-CM | POA: Diagnosis not present

## 2019-10-28 DIAGNOSIS — E1122 Type 2 diabetes mellitus with diabetic chronic kidney disease: Secondary | ICD-10-CM | POA: Diagnosis not present

## 2019-11-04 ENCOUNTER — Other Ambulatory Visit (HOSPITAL_COMMUNITY): Payer: Medicare Other

## 2019-11-16 ENCOUNTER — Ambulatory Visit (HOSPITAL_COMMUNITY)
Admission: RE | Admit: 2019-11-16 | Discharge: 2019-11-16 | Disposition: A | Discharge status: Home / Self Care | Payer: Medicare Other | Source: Ambulatory Visit | Attending: Family Medicine | Admitting: Family Medicine

## 2019-11-16 ENCOUNTER — Other Ambulatory Visit: Payer: Self-pay

## 2019-11-16 DIAGNOSIS — E2839 Other primary ovarian failure: Secondary | ICD-10-CM | POA: Insufficient documentation

## 2019-11-16 DIAGNOSIS — Z720 Tobacco use: Secondary | ICD-10-CM | POA: Diagnosis not present

## 2019-11-16 DIAGNOSIS — M85851 Other specified disorders of bone density and structure, right thigh: Secondary | ICD-10-CM | POA: Diagnosis not present

## 2019-11-16 DIAGNOSIS — Z90710 Acquired absence of both cervix and uterus: Secondary | ICD-10-CM | POA: Diagnosis not present

## 2019-11-16 DIAGNOSIS — R29890 Loss of height: Secondary | ICD-10-CM | POA: Diagnosis not present

## 2019-11-17 DIAGNOSIS — B351 Tinea unguium: Secondary | ICD-10-CM | POA: Diagnosis not present

## 2019-11-17 DIAGNOSIS — B353 Tinea pedis: Secondary | ICD-10-CM | POA: Diagnosis not present

## 2019-11-17 DIAGNOSIS — E1142 Type 2 diabetes mellitus with diabetic polyneuropathy: Secondary | ICD-10-CM | POA: Diagnosis not present

## 2019-12-09 DIAGNOSIS — Z961 Presence of intraocular lens: Secondary | ICD-10-CM | POA: Diagnosis not present

## 2019-12-09 DIAGNOSIS — H11823 Conjunctivochalasis, bilateral: Secondary | ICD-10-CM | POA: Diagnosis not present

## 2019-12-09 DIAGNOSIS — E119 Type 2 diabetes mellitus without complications: Secondary | ICD-10-CM | POA: Diagnosis not present

## 2019-12-09 DIAGNOSIS — H52223 Regular astigmatism, bilateral: Secondary | ICD-10-CM | POA: Diagnosis not present

## 2019-12-09 DIAGNOSIS — Z7984 Long term (current) use of oral hypoglycemic drugs: Secondary | ICD-10-CM | POA: Diagnosis not present

## 2019-12-09 DIAGNOSIS — H04203 Unspecified epiphora, bilateral lacrimal glands: Secondary | ICD-10-CM | POA: Diagnosis not present

## 2019-12-09 DIAGNOSIS — H5212 Myopia, left eye: Secondary | ICD-10-CM | POA: Diagnosis not present

## 2019-12-09 DIAGNOSIS — H35033 Hypertensive retinopathy, bilateral: Secondary | ICD-10-CM | POA: Diagnosis not present

## 2019-12-09 DIAGNOSIS — I10 Essential (primary) hypertension: Secondary | ICD-10-CM | POA: Diagnosis not present

## 2019-12-09 DIAGNOSIS — H43812 Vitreous degeneration, left eye: Secondary | ICD-10-CM | POA: Diagnosis not present

## 2019-12-09 DIAGNOSIS — H5201 Hypermetropia, right eye: Secondary | ICD-10-CM | POA: Diagnosis not present

## 2019-12-09 DIAGNOSIS — H524 Presbyopia: Secondary | ICD-10-CM | POA: Diagnosis not present

## 2019-12-31 DIAGNOSIS — N1832 Chronic kidney disease, stage 3b: Secondary | ICD-10-CM | POA: Diagnosis not present

## 2019-12-31 DIAGNOSIS — E1122 Type 2 diabetes mellitus with diabetic chronic kidney disease: Secondary | ICD-10-CM | POA: Diagnosis not present

## 2019-12-31 DIAGNOSIS — E7849 Other hyperlipidemia: Secondary | ICD-10-CM | POA: Diagnosis not present

## 2019-12-31 DIAGNOSIS — I129 Hypertensive chronic kidney disease with stage 1 through stage 4 chronic kidney disease, or unspecified chronic kidney disease: Secondary | ICD-10-CM | POA: Diagnosis not present

## 2020-01-19 DIAGNOSIS — I129 Hypertensive chronic kidney disease with stage 1 through stage 4 chronic kidney disease, or unspecified chronic kidney disease: Secondary | ICD-10-CM | POA: Diagnosis not present

## 2020-01-19 DIAGNOSIS — I82409 Acute embolism and thrombosis of unspecified deep veins of unspecified lower extremity: Secondary | ICD-10-CM | POA: Diagnosis not present

## 2020-01-19 DIAGNOSIS — E7849 Other hyperlipidemia: Secondary | ICD-10-CM | POA: Diagnosis not present

## 2020-01-19 DIAGNOSIS — Z719 Counseling, unspecified: Secondary | ICD-10-CM | POA: Diagnosis not present

## 2020-01-19 DIAGNOSIS — Z6829 Body mass index (BMI) 29.0-29.9, adult: Secondary | ICD-10-CM | POA: Diagnosis not present

## 2020-01-19 DIAGNOSIS — I10 Essential (primary) hypertension: Secondary | ICD-10-CM | POA: Diagnosis not present

## 2020-01-19 DIAGNOSIS — E1129 Type 2 diabetes mellitus with other diabetic kidney complication: Secondary | ICD-10-CM | POA: Diagnosis not present

## 2020-01-19 DIAGNOSIS — E663 Overweight: Secondary | ICD-10-CM | POA: Diagnosis not present

## 2020-01-19 DIAGNOSIS — E041 Nontoxic single thyroid nodule: Secondary | ICD-10-CM | POA: Diagnosis not present

## 2020-01-19 DIAGNOSIS — Z23 Encounter for immunization: Secondary | ICD-10-CM | POA: Diagnosis not present

## 2020-01-26 DIAGNOSIS — E1142 Type 2 diabetes mellitus with diabetic polyneuropathy: Secondary | ICD-10-CM | POA: Diagnosis not present

## 2020-01-26 DIAGNOSIS — B351 Tinea unguium: Secondary | ICD-10-CM | POA: Diagnosis not present

## 2020-01-30 DIAGNOSIS — I129 Hypertensive chronic kidney disease with stage 1 through stage 4 chronic kidney disease, or unspecified chronic kidney disease: Secondary | ICD-10-CM | POA: Diagnosis not present

## 2020-01-30 DIAGNOSIS — E7849 Other hyperlipidemia: Secondary | ICD-10-CM | POA: Diagnosis not present

## 2020-01-30 DIAGNOSIS — E1122 Type 2 diabetes mellitus with diabetic chronic kidney disease: Secondary | ICD-10-CM | POA: Diagnosis not present

## 2020-01-30 DIAGNOSIS — N1832 Chronic kidney disease, stage 3b: Secondary | ICD-10-CM | POA: Diagnosis not present

## 2020-02-10 DIAGNOSIS — Z23 Encounter for immunization: Secondary | ICD-10-CM | POA: Diagnosis not present

## 2020-02-29 DIAGNOSIS — N281 Cyst of kidney, acquired: Secondary | ICD-10-CM | POA: Diagnosis not present

## 2020-02-29 DIAGNOSIS — N261 Atrophy of kidney (terminal): Secondary | ICD-10-CM | POA: Diagnosis not present

## 2020-03-04 DIAGNOSIS — Z681 Body mass index (BMI) 19 or less, adult: Secondary | ICD-10-CM | POA: Diagnosis not present

## 2020-03-04 DIAGNOSIS — Z0001 Encounter for general adult medical examination with abnormal findings: Secondary | ICD-10-CM | POA: Diagnosis not present

## 2020-03-04 DIAGNOSIS — Z79899 Other long term (current) drug therapy: Secondary | ICD-10-CM | POA: Diagnosis not present

## 2020-03-04 DIAGNOSIS — I129 Hypertensive chronic kidney disease with stage 1 through stage 4 chronic kidney disease, or unspecified chronic kidney disease: Secondary | ICD-10-CM | POA: Diagnosis not present

## 2020-03-04 DIAGNOSIS — E041 Nontoxic single thyroid nodule: Secondary | ICD-10-CM | POA: Diagnosis not present

## 2020-03-04 DIAGNOSIS — Z1331 Encounter for screening for depression: Secondary | ICD-10-CM | POA: Diagnosis not present

## 2020-03-04 DIAGNOSIS — E1129 Type 2 diabetes mellitus with other diabetic kidney complication: Secondary | ICD-10-CM | POA: Diagnosis not present

## 2020-03-04 DIAGNOSIS — E559 Vitamin D deficiency, unspecified: Secondary | ICD-10-CM | POA: Diagnosis not present

## 2020-03-04 DIAGNOSIS — I82409 Acute embolism and thrombosis of unspecified deep veins of unspecified lower extremity: Secondary | ICD-10-CM | POA: Diagnosis not present

## 2020-04-01 DIAGNOSIS — I129 Hypertensive chronic kidney disease with stage 1 through stage 4 chronic kidney disease, or unspecified chronic kidney disease: Secondary | ICD-10-CM | POA: Diagnosis not present

## 2020-04-01 DIAGNOSIS — E1122 Type 2 diabetes mellitus with diabetic chronic kidney disease: Secondary | ICD-10-CM | POA: Diagnosis not present

## 2020-04-01 DIAGNOSIS — N1832 Chronic kidney disease, stage 3b: Secondary | ICD-10-CM | POA: Diagnosis not present

## 2020-04-01 DIAGNOSIS — E7849 Other hyperlipidemia: Secondary | ICD-10-CM | POA: Diagnosis not present

## 2020-05-03 DIAGNOSIS — N2581 Secondary hyperparathyroidism of renal origin: Secondary | ICD-10-CM | POA: Insufficient documentation

## 2020-09-22 ENCOUNTER — Telehealth: Payer: Self-pay

## 2020-09-22 NOTE — Telephone Encounter (Signed)
Referral notes sent from [facility], Phone #: [phone], Fax #: [phone]   Notes sent to scheduling

## 2020-10-20 ENCOUNTER — Encounter: Payer: Self-pay | Admitting: Internal Medicine

## 2020-11-07 ENCOUNTER — Telehealth: Payer: Self-pay

## 2020-11-23 NOTE — Telephone Encounter (Signed)
RECORDS RECEIVED

## 2020-12-21 ENCOUNTER — Ambulatory Visit: Payer: Medicare Other | Admitting: Cardiovascular Disease

## 2020-12-21 ENCOUNTER — Other Ambulatory Visit: Payer: Self-pay

## 2020-12-21 ENCOUNTER — Encounter: Payer: Self-pay | Admitting: Cardiovascular Disease

## 2020-12-21 VITALS — BP 148/76 | HR 86 | Ht 70.0 in | Wt 197.8 lb

## 2020-12-21 DIAGNOSIS — R002 Palpitations: Secondary | ICD-10-CM | POA: Diagnosis not present

## 2020-12-21 DIAGNOSIS — I10 Essential (primary) hypertension: Secondary | ICD-10-CM | POA: Diagnosis not present

## 2020-12-21 DIAGNOSIS — R0989 Other specified symptoms and signs involving the circulatory and respiratory systems: Secondary | ICD-10-CM

## 2020-12-21 DIAGNOSIS — E1122 Type 2 diabetes mellitus with diabetic chronic kidney disease: Secondary | ICD-10-CM

## 2020-12-21 DIAGNOSIS — R0602 Shortness of breath: Secondary | ICD-10-CM | POA: Diagnosis not present

## 2020-12-21 DIAGNOSIS — N1832 Chronic kidney disease, stage 3b: Secondary | ICD-10-CM

## 2020-12-21 DIAGNOSIS — E7800 Pure hypercholesterolemia, unspecified: Secondary | ICD-10-CM

## 2020-12-21 MED ORDER — BISOPROLOL FUMARATE 5 MG PO TABS: 5.0000 mg | ORAL_TABLET | Freq: Every day | ORAL | 3 refills | Status: DC
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
## 2020-12-21 NOTE — Patient Instructions (Signed)
Medication Instructions:  STOP the Carvedilol  START Bisoprolol 5 mg once daily  *If you need a refill on your cardiac medications before your next appointment, please call your pharmacy*   Lab Work: None ordered If you have labs (blood work) drawn today and your tests are completely normal, you will receive your results only by: [place] (if you have MyChart) OR A paper copy in the mail If you have any lab test that is abnormal or we need to change your treatment, we will call you to review the results.   Testing/Procedures: Your physician has requested that you have an echocardiogram. Echocardiography is a painless test that uses sound waves to create images of your heart. It provides your doctor with information about the size and shape of your heart and how well your heart's chambers and valves are working. You may receive an ultrasound enhancing agent through an IV if needed to better visualize your heart during the echo.This procedure takes approximately one hour. There are no restrictions for this procedure. This will take place at the [address]   Your physician has requested that you have a carotid duplex. This test is an ultrasound of the carotid arteries in your neck. It looks at blood flow through these arteries that supply the brain with blood. Allow one hour for this exam. There are no restrictions or special instructions. This will take place at [place], [address].  Follow-Up: At [facility], you and your health needs are our priority.  As part of our continuing mission to provide you with exceptional heart care, we have created designated Provider Care Teams.  These Care Teams include your primary Cardiologist (physician) and Advanced Practice Providers (APPs -  Physician Assistants and Nurse Practitioners) who all work together to provide you with the care you need, when you need it.  We recommend signing up for the patient portal  called "MyChart".  Sign up information is provided on this After Visit Summary.  MyChart is used to connect with patients for Virtual Visits (Telemedicine).  Patients are able to view lab/test results, encounter notes, upcoming appointments, etc.  Non-urgent messages can be sent to your provider as well.   To learn more about what you can do with MyChart, go to [email or web address].    Your next appointment:   Follow up with Dr. Sallyanne Kuster after the testing

## 2020-12-24 ENCOUNTER — Encounter: Payer: Self-pay | Admitting: Cardiovascular Disease

## 2020-12-28 ENCOUNTER — Other Ambulatory Visit: Payer: Self-pay

## 2020-12-28 ENCOUNTER — Ambulatory Visit (HOSPITAL_COMMUNITY)
Admission: RE | Admit: 2020-12-28 | Discharge: 2020-12-28 | Disposition: A | Discharge status: Home / Self Care | Payer: Medicare Other | Source: Ambulatory Visit | Attending: Cardiology | Admitting: Cardiology

## 2020-12-28 DIAGNOSIS — R0989 Other specified symptoms and signs involving the circulatory and respiratory systems: Secondary | ICD-10-CM | POA: Diagnosis present

## 2021-01-04 ENCOUNTER — Encounter: Payer: Self-pay | Admitting: Internal Medicine

## 2021-01-05 ENCOUNTER — Encounter: Payer: Self-pay | Admitting: Internal Medicine

## 2021-01-09 ENCOUNTER — Other Ambulatory Visit: Payer: Self-pay

## 2021-01-09 ENCOUNTER — Ambulatory Visit (HOSPITAL_COMMUNITY): Payer: Medicare Other | Attending: Cardiovascular Disease

## 2021-01-09 DIAGNOSIS — R0602 Shortness of breath: Secondary | ICD-10-CM

## 2021-01-09 LAB — ECHOCARDIOGRAM COMPLETE
Area-P 1/2: 3.65 cm2
S' Lateral: 3.2 cm

## 2021-01-12 ENCOUNTER — Emergency Department (HOSPITAL_COMMUNITY): Payer: Medicare Other

## 2021-01-12 ENCOUNTER — Other Ambulatory Visit: Payer: Self-pay

## 2021-01-12 ENCOUNTER — Emergency Department (HOSPITAL_COMMUNITY)
Admission: EM | Admit: 2021-01-12 | Discharge: 2021-01-12 | Disposition: A | Discharge status: Home / Self Care | Payer: Medicare Other | Attending: Emergency Medicine | Admitting: Emergency Medicine

## 2021-01-12 ENCOUNTER — Encounter (HOSPITAL_COMMUNITY): Payer: Self-pay | Admitting: *Deleted

## 2021-01-12 DIAGNOSIS — Z7984 Long term (current) use of oral hypoglycemic drugs: Secondary | ICD-10-CM | POA: Diagnosis not present

## 2021-01-12 DIAGNOSIS — Z7982 Long term (current) use of aspirin: Secondary | ICD-10-CM | POA: Insufficient documentation

## 2021-01-12 DIAGNOSIS — N281 Cyst of kidney, acquired: Secondary | ICD-10-CM | POA: Insufficient documentation

## 2021-01-12 DIAGNOSIS — K219 Gastro-esophageal reflux disease without esophagitis: Secondary | ICD-10-CM | POA: Insufficient documentation

## 2021-01-12 DIAGNOSIS — Z79899 Other long term (current) drug therapy: Secondary | ICD-10-CM | POA: Diagnosis not present

## 2021-01-12 DIAGNOSIS — R0602 Shortness of breath: Secondary | ICD-10-CM | POA: Diagnosis not present

## 2021-01-12 DIAGNOSIS — E119 Type 2 diabetes mellitus without complications: Secondary | ICD-10-CM | POA: Diagnosis not present

## 2021-01-12 DIAGNOSIS — E279 Disorder of adrenal gland, unspecified: Secondary | ICD-10-CM | POA: Diagnosis not present

## 2021-01-12 DIAGNOSIS — F17210 Nicotine dependence, cigarettes, uncomplicated: Secondary | ICD-10-CM | POA: Diagnosis not present

## 2021-01-12 DIAGNOSIS — D3500 Benign neoplasm of unspecified adrenal gland: Secondary | ICD-10-CM

## 2021-01-12 DIAGNOSIS — R109 Unspecified abdominal pain: Secondary | ICD-10-CM | POA: Diagnosis present

## 2021-01-12 DIAGNOSIS — I10 Essential (primary) hypertension: Secondary | ICD-10-CM | POA: Diagnosis not present

## 2021-01-12 LAB — COMPREHENSIVE METABOLIC PANEL
ALT: 12 U/L (ref 0–44)
AST: 15 U/L (ref 15–41)
Albumin: 3.5 g/dL (ref 3.5–5.0)
Alkaline Phosphatase: 79 U/L (ref 38–126)
Anion gap: 8 (ref 5–15)
BUN: 22 mg/dL (ref 8–23)
CO2: 24 mmol/L (ref 22–32)
Calcium: 9.4 mg/dL (ref 8.9–10.3)
Chloride: 106 mmol/L (ref 98–111)
Creatinine, Ser: 1.6 mg/dL — ABNORMAL HIGH (ref 0.44–1.00)
GFR, Estimated: 33 mL/min — ABNORMAL LOW (ref >60)
Glucose, Bld: 268 mg/dL — ABNORMAL HIGH (ref 70–99)
Potassium: 4.4 mmol/L (ref 3.5–5.1)
Sodium: 138 mmol/L (ref 135–145)
Total Bilirubin: 0.6 mg/dL (ref 0.3–1.2)
Total Protein: 7.3 g/dL (ref 6.5–8.1)

## 2021-01-12 LAB — URINALYSIS, ROUTINE W REFLEX MICROSCOPIC
Bacteria, UA: NONE SEEN
Bilirubin Urine: NEGATIVE
Glucose, UA: NEGATIVE mg/dL
Hgb urine dipstick: NEGATIVE
Ketones, ur: NEGATIVE mg/dL
Leukocytes,Ua: NEGATIVE
Nitrite: NEGATIVE
Protein, ur: 100 mg/dL — AB
Specific Gravity, Urine: 1.011 (ref 1.005–1.030)
pH: 6 (ref 5.0–8.0)

## 2021-01-12 LAB — CBC WITH DIFFERENTIAL/PLATELET
Abs Immature Granulocytes: 0.05 10*3/uL (ref 0.00–0.07)
Basophils Absolute: 0.1 10*3/uL (ref 0.0–0.1)
Basophils Relative: 1 %
Eosinophils Absolute: 0.2 10*3/uL (ref 0.0–0.5)
Eosinophils Relative: 3 %
HCT: 38.8 % (ref 36.0–46.0)
Hemoglobin: 12 g/dL (ref 12.0–15.0)
Immature Granulocytes: 1 %
Lymphocytes Relative: 30 %
Lymphs Abs: 2 10*3/uL (ref 0.7–4.0)
MCH: 30 pg (ref 26.0–34.0)
MCHC: 30.9 g/dL (ref 30.0–36.0)
MCV: 97 fL (ref 80.0–100.0)
Monocytes Absolute: 0.6 10*3/uL (ref 0.1–1.0)
Monocytes Relative: 9 %
Neutro Abs: 3.7 10*3/uL (ref 1.7–7.7)
Neutrophils Relative %: 56 %
Platelets: 258 10*3/uL (ref 150–400)
RBC: 4 MIL/uL (ref 3.87–5.11)
RDW: 14.1 % (ref 11.5–15.5)
WBC: 6.6 10*3/uL (ref 4.0–10.5)
nRBC: 0 % (ref 0.0–0.2)

## 2021-01-12 LAB — LIPASE, BLOOD: Lipase: 33 U/L (ref 11–51)

## 2021-01-12 MED ORDER — ACETAMINOPHEN 325 MG PO TABS
650.0000 mg | ORAL_TABLET | Freq: Once | ORAL | Status: AC
  Administered 2021-01-12: 650 mg via ORAL
  Filled 2021-01-12: qty 2

## 2021-01-12 MED ORDER — LIDOCAINE 5 % EX PTCH: 1.0000 | MEDICATED_PATCH | CUTANEOUS | 0 refills | Status: DC

## 2021-01-12 MED ORDER — DICLOFENAC SODIUM 1 % EX GEL: 2.0000 g | Freq: Four times a day (QID) | CUTANEOUS | 0 refills | Status: DC
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
## 2021-01-12 NOTE — ED Triage Notes (Signed)
Pt c/o left flank pain that started 3 days ago. Denies urinary symptoms. Pt reports she saw her PCP today who sent her here. Pt also reports she only has one functioning kidney (left).

## 2021-01-12 NOTE — ED Provider Notes (Signed)
Emergency Medicine Provider Triage Evaluation Note  [name] , a 75 y.o. female  was evaluated in triage.  Pt complains of left flank pain.  Pain begins in the left lower back and radiates around to the left flank.  She denies abdominal pain.  She saw her PCP today who did a UA in the office and notes that did not show any blood or sign of infection but did show some protein.  Denies fevers or chills.  No radiation of pain up into the chest.  Review of Systems  Positive: Left flank pain  Negative: No fever, chills, CP, or SOB.   Physical Exam  BP (!) 149/69 (BP Location: Left Arm)   Pulse 79   Temp 97.8 F (36.6 C) (Oral)   Resp 17   Ht 5\' 10"  (1.778 m)   Wt 89.4 kg   LMP  (LMP Unknown)   SpO2 96%   BMI 28.27 kg/m  Gen:   Awake, no distress   Resp:  Normal effort  MSK:   Moves extremities without difficulty  Other:  Anterior abdomen is non-tender to palpation.   Medical Decision Making  Medically screening exam initiated at 5:35 PM.  Appropriate orders placed.  [name] was informed that the remainder of the evaluation will be completed by another provider, this initial triage assessment does not replace that evaluation, and the importance of remaining in the ED until their evaluation is complete.  Lab work along with CT renal ordered. Will follow up once roomed.    Margette Fast, MD 01/12/21 [phone]

## 2021-01-12 NOTE — Discharge Instructions (Addendum)
You did not have any evidence of a urinary tract infection.   Please follow up with your primary care provider and/or your nephrologist within 5-7 days for re-evaluation of your symptoms.   You will need to have a follow up MRI or an Ultrasound of your kidneys as an outpatient  Please return to the emergency department for any new or worsening symptoms.

## 2021-01-18 ENCOUNTER — Ambulatory Visit: Payer: Medicare Other | Admitting: Cardiovascular Disease

## 2021-01-18 ENCOUNTER — Other Ambulatory Visit: Payer: Self-pay

## 2021-01-18 ENCOUNTER — Encounter: Payer: Self-pay | Admitting: Cardiovascular Disease

## 2021-01-18 VITALS — BP 132/54 | HR 81 | Ht 70.0 in | Wt 202.0 lb

## 2021-01-18 DIAGNOSIS — N1832 Chronic kidney disease, stage 3b: Secondary | ICD-10-CM

## 2021-01-18 DIAGNOSIS — I10 Essential (primary) hypertension: Secondary | ICD-10-CM | POA: Diagnosis not present

## 2021-01-18 DIAGNOSIS — R002 Palpitations: Secondary | ICD-10-CM | POA: Diagnosis not present

## 2021-01-18 DIAGNOSIS — J439 Emphysema, unspecified: Secondary | ICD-10-CM | POA: Diagnosis not present

## 2021-01-18 DIAGNOSIS — E1122 Type 2 diabetes mellitus with diabetic chronic kidney disease: Secondary | ICD-10-CM

## 2021-01-18 DIAGNOSIS — R0989 Other specified symptoms and signs involving the circulatory and respiratory systems: Secondary | ICD-10-CM
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
## 2021-01-18 NOTE — Patient Instructions (Signed)
Medication Instructions:  No changes *If you need a refill on your cardiac medications before your next appointment, please call your pharmacy*   Lab Work: None ordered If you have labs (blood work) drawn today and your tests are completely normal, you will receive your results only by: [place] (if you have MyChart) OR A paper copy in the mail If you have any lab test that is abnormal or we need to change your treatment, we will call you to review the results.   Testing/Procedures: Your physician has recommended that you have a pulmonary function test. Pulmonary Function Tests are a group of tests that measure how well air moves in and out of your lungs.  Follow-Up: At [facility], you and your health needs are our priority.  As part of our continuing mission to provide you with exceptional heart care, we have created designated Provider Care Teams.  These Care Teams include your primary Cardiologist (physician) and Advanced Practice Providers (APPs -  Physician Assistants and Nurse Practitioners) who all work together to provide you with the care you need, when you need it.  We recommend signing up for the patient portal called "MyChart".  Sign up information is provided on this After Visit Summary.  MyChart is used to connect with patients for Virtual Visits (Telemedicine).  Patients are able to view lab/test results, encounter notes, upcoming appointments, etc.  Non-urgent messages can be sent to your provider as well.   To learn more about what you can do with MyChart, go to [email or web address].    Your next appointment:   Follow up as needed with Dr. Sallyanne Kuster

## 2021-02-09 ENCOUNTER — Ambulatory Visit: Payer: Medicare Other | Admitting: Internal Medicine

## 2021-02-17 ENCOUNTER — Other Ambulatory Visit (HOSPITAL_COMMUNITY)
Admission: RE | Admit: 2021-02-17 | Discharge: 2021-02-17 | Disposition: A | Discharge status: Home / Self Care | Payer: Medicare Other | Source: Ambulatory Visit | Attending: Cardiovascular Disease | Admitting: Cardiovascular Disease

## 2021-02-17 ENCOUNTER — Ambulatory Visit: Payer: Medicare Other | Admitting: Gastroenterology

## 2021-02-17 DIAGNOSIS — Z20822 Contact with and (suspected) exposure to covid-19: Secondary | ICD-10-CM | POA: Diagnosis not present

## 2021-02-17 DIAGNOSIS — Z01812 Encounter for preprocedural laboratory examination: Secondary | ICD-10-CM | POA: Diagnosis present

## 2021-02-17 DIAGNOSIS — Z01818 Encounter for other preprocedural examination: Secondary | ICD-10-CM

## 2021-02-18 LAB — SARS CORONAVIRUS 2 (TAT 6-24 HRS): SARS Coronavirus 2: NEGATIVE

## 2021-02-21 ENCOUNTER — Other Ambulatory Visit: Payer: Self-pay

## 2021-02-21 ENCOUNTER — Ambulatory Visit (HOSPITAL_COMMUNITY)
Admission: RE | Admit: 2021-02-21 | Discharge: 2021-02-21 | Disposition: A | Discharge status: Home / Self Care | Payer: Medicare Other | Source: Ambulatory Visit | Attending: Cardiovascular Disease | Admitting: Cardiovascular Disease

## 2021-02-21 DIAGNOSIS — J439 Emphysema, unspecified: Secondary | ICD-10-CM | POA: Diagnosis present

## 2021-02-21 LAB — PULMONARY FUNCTION TEST
DL/VA % pred: 57 %
DL/VA: 2.25 ml/min/mmHg/L
DLCO unc % pred: 38 %
DLCO unc: 8.94 ml/min/mmHg
FEF 25-75 Post: 0.83 L/sec
FEF 25-75 Pre: 0.65 L/sec
FEF2575-%Change-Post: 27 %
FEF2575-%Pred-Post: 44 %
FEF2575-%Pred-Pre: 34 %
FEV1-%Change-Post: 7 %
FEV1-%Pred-Post: 63 %
FEV1-%Pred-Pre: 59 %
FEV1-Post: 1.41 L
FEV1-Pre: 1.31 L
FEV1FVC-%Change-Post: -10 %
FEV1FVC-%Pred-Pre: 83 %
FEV6-%Change-Post: 18 %
FEV6-%Pred-Post: 85 %
FEV6-%Pred-Pre: 71 %
FEV6-Post: 2.33 L
FEV6-Pre: 1.96 L
FEV6FVC-%Change-Post: -1 %
FEV6FVC-%Pred-Post: 96 %
FEV6FVC-%Pred-Pre: 98 %
FVC-%Change-Post: 20 %
FVC-%Pred-Post: 88 %
FVC-%Pred-Pre: 72 %
FVC-Post: 2.5 L
FVC-Pre: 2.07 L
Post FEV1/FVC ratio: 56 %
Post FEV6/FVC ratio: 93 %
Pre FEV1/FVC ratio: 63 %
Pre FEV6/FVC Ratio: 95 %
RV % pred: 143 %
RV: 3.79 L
TLC % pred: 98 %
TLC: 5.84 L

## 2021-02-21 MED ORDER — ALBUTEROL SULFATE (2.5 MG/3ML) 0.083% IN NEBU
2.5000 mg | INHALATION_SOLUTION | Freq: Once | RESPIRATORY_TRACT | Status: AC
  Administered 2021-02-21: 2.5 mg via RESPIRATORY_TRACT

## 2021-05-26 ENCOUNTER — Telehealth: Payer: Self-pay | Admitting: Gastroenterology

## 2021-05-26 ENCOUNTER — Ambulatory Visit (INDEPENDENT_AMBULATORY_CARE_PROVIDER_SITE_OTHER): Payer: Medicare Other | Admitting: Gastroenterology

## 2021-05-26 ENCOUNTER — Encounter: Payer: Self-pay | Admitting: Gastroenterology

## 2021-05-26 ENCOUNTER — Other Ambulatory Visit: Payer: Self-pay

## 2021-05-26 ENCOUNTER — Telehealth: Payer: Self-pay

## 2021-05-26 VITALS — BP 133/68 | HR 74 | Temp 97.1°F | Ht 70.0 in | Wt 198.8 lb

## 2021-05-26 DIAGNOSIS — K219 Gastro-esophageal reflux disease without esophagitis: Secondary | ICD-10-CM | POA: Diagnosis not present

## 2021-05-26 DIAGNOSIS — Z1211 Encounter for screening for malignant neoplasm of colon: Secondary | ICD-10-CM

## 2021-05-26 DIAGNOSIS — K5900 Constipation, unspecified: Secondary | ICD-10-CM | POA: Diagnosis not present

## 2021-05-26 MED ORDER — PEG 3350-KCL-NA BICARB-NACL 420 G PO SOLR: 4000.0000 mL | ORAL | 0 refills | Status: DC
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
## 2021-05-26 NOTE — Patient Instructions (Signed)
We are scheduling you for colonoscopy with Dr. Abbey Chatters in the near future.  The night before your procedure only take half your normal dose of metformin and glimepiride.   The morning of the procedure do not take your metformin glimepiride Actos or Tradjenta.   I recommend that you take 2 teaspoons of Benefiber in your water coffee or juice daily to see if this helps with your constipation.  Please call with a progress report in 2 to 3 weeks.  I am giving you a handout about this.  It was a pleasure to see you today. I want to create trusting relationships with patients. If you receive a survey regarding your visit,  I greatly appreciate you taking time to fill this out on paper or through your MyChart. I value your feedback.  Venetia Night, MSN, FNP-BC, AGACNP-BC [facility]

## 2021-05-26 NOTE — Telephone Encounter (Signed)
PA for TCS submitted via [facility] website for [facility] [employer]. PA# [id number], valid 06/07/21-09/05/21. Attempted to submit PA for [facility] plan via [facility] website. Received message: BLOCKING - Product does not have notification requirements.
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
## 2021-05-26 NOTE — Telephone Encounter (Signed)
Error

## 2021-06-06 ENCOUNTER — Telehealth: Payer: Self-pay | Admitting: *Deleted

## 2021-06-06 ENCOUNTER — Other Ambulatory Visit (HOSPITAL_COMMUNITY)
Admission: RE | Admit: 2021-06-06 | Discharge: 2021-06-06 | Disposition: A | Discharge status: Home / Self Care | Payer: Medicare Other | Source: Ambulatory Visit | Attending: Internal Medicine | Admitting: Internal Medicine

## 2021-06-06 DIAGNOSIS — Z1211 Encounter for screening for malignant neoplasm of colon: Secondary | ICD-10-CM | POA: Diagnosis present

## 2021-06-06 LAB — BASIC METABOLIC PANEL
Anion gap: 4 — ABNORMAL LOW (ref 5–15)
BUN: 23 mg/dL (ref 8–23)
CO2: 24 mmol/L (ref 22–32)
Calcium: 9.4 mg/dL (ref 8.9–10.3)
Chloride: 109 mmol/L (ref 98–111)
Creatinine, Ser: 1.81 mg/dL — ABNORMAL HIGH (ref 0.44–1.00)
GFR, Estimated: 28 mL/min — ABNORMAL LOW (ref >60)
Glucose, Bld: 129 mg/dL — ABNORMAL HIGH (ref 70–99)
Potassium: 5 mmol/L (ref 3.5–5.1)
Sodium: 137 mmol/L (ref 135–145)

## 2021-06-06 NOTE — Telephone Encounter (Signed)
Received message from endo pt has not had labs done yet ? ?Called pt home #, no answer and no VM ?Called cell # and spoke with pt. She stated she was not aware she needed labs done prior. She is going to go to lab now. ?

## 2021-06-07 ENCOUNTER — Ambulatory Visit (HOSPITAL_COMMUNITY): Payer: Medicare Other | Admitting: Anesthesiology

## 2021-06-07 ENCOUNTER — Other Ambulatory Visit: Payer: Self-pay

## 2021-06-07 ENCOUNTER — Ambulatory Visit (HOSPITAL_BASED_OUTPATIENT_CLINIC_OR_DEPARTMENT_OTHER): Payer: Medicare Other | Admitting: Anesthesiology

## 2021-06-07 ENCOUNTER — Encounter (HOSPITAL_COMMUNITY): Payer: Self-pay

## 2021-06-07 ENCOUNTER — Ambulatory Visit (HOSPITAL_COMMUNITY)
Admission: RE | Admit: 2021-06-07 | Discharge: 2021-06-07 | Disposition: A | Discharge status: Home / Self Care | Payer: Medicare Other | Attending: Internal Medicine | Admitting: Internal Medicine

## 2021-06-07 ENCOUNTER — Encounter (HOSPITAL_COMMUNITY)
Admission: RE | Disposition: A | Discharge status: Home / Self Care | Payer: Self-pay | Source: Home / Self Care | Attending: Internal Medicine

## 2021-06-07 DIAGNOSIS — K635 Polyp of colon: Secondary | ICD-10-CM

## 2021-06-07 DIAGNOSIS — K648 Other hemorrhoids: Secondary | ICD-10-CM | POA: Diagnosis not present

## 2021-06-07 DIAGNOSIS — K5900 Constipation, unspecified: Secondary | ICD-10-CM | POA: Insufficient documentation

## 2021-06-07 DIAGNOSIS — M1990 Unspecified osteoarthritis, unspecified site: Secondary | ICD-10-CM | POA: Diagnosis not present

## 2021-06-07 DIAGNOSIS — K219 Gastro-esophageal reflux disease without esophagitis: Secondary | ICD-10-CM | POA: Insufficient documentation

## 2021-06-07 DIAGNOSIS — D122 Benign neoplasm of ascending colon: Secondary | ICD-10-CM | POA: Diagnosis not present

## 2021-06-07 DIAGNOSIS — Z86718 Personal history of other venous thrombosis and embolism: Secondary | ICD-10-CM | POA: Insufficient documentation

## 2021-06-07 DIAGNOSIS — E119 Type 2 diabetes mellitus without complications: Secondary | ICD-10-CM | POA: Insufficient documentation

## 2021-06-07 DIAGNOSIS — I10 Essential (primary) hypertension: Secondary | ICD-10-CM | POA: Insufficient documentation

## 2021-06-07 DIAGNOSIS — Z1211 Encounter for screening for malignant neoplasm of colon: Secondary | ICD-10-CM

## 2021-06-07 DIAGNOSIS — D125 Benign neoplasm of sigmoid colon: Secondary | ICD-10-CM | POA: Diagnosis not present

## 2021-06-07 DIAGNOSIS — K5730 Diverticulosis of large intestine without perforation or abscess without bleeding: Secondary | ICD-10-CM

## 2021-06-07 DIAGNOSIS — Z87891 Personal history of nicotine dependence: Secondary | ICD-10-CM | POA: Insufficient documentation

## 2021-06-07 DIAGNOSIS — D124 Benign neoplasm of descending colon: Secondary | ICD-10-CM | POA: Insufficient documentation

## 2021-06-07 HISTORY — PX: COLONOSCOPY WITH PROPOFOL: SHX5780

## 2021-06-07 HISTORY — PX: POLYPECTOMY: SHX149

## 2021-06-07 LAB — GLUCOSE, CAPILLARY: Glucose-Capillary: 151 mg/dL — ABNORMAL HIGH (ref 70–99)

## 2021-06-07 SURGERY — COLONOSCOPY WITH PROPOFOL
Anesthesia: General

## 2021-06-07 MED ORDER — LIDOCAINE HCL (CARDIAC) PF 100 MG/5ML IV SOSY
PREFILLED_SYRINGE | INTRAVENOUS | Status: DC | PRN
  Administered 2021-06-07: 50 mg via INTRAVENOUS

## 2021-06-07 MED ORDER — PROPOFOL 10 MG/ML IV BOLUS
INTRAVENOUS | Status: DC | PRN
  Administered 2021-06-07: 20 mg via INTRAVENOUS
  Administered 2021-06-07: 50 mg via INTRAVENOUS
  Administered 2021-06-07: 100 mg via INTRAVENOUS
  Administered 2021-06-07: 50 mg via INTRAVENOUS

## 2021-06-07 MED ORDER — LACTATED RINGERS IV SOLN: INTRAVENOUS | Status: DC

## 2021-06-07 NOTE — Op Note (Signed)
[facility] ?Patient Name: [name] ?Procedure Date: 06/07/2021 9:14 AM ?MRN: [id number] ?Date of Birth: [birth year] ?Attending MD: Elon Alas. Abbey Chatters , DO ?CSN: [id number] ?Age: 76 ?Admit Type: Outpatient ?Procedure:                Colonoscopy ?Indications:              Screening for colorectal malignant neoplasm ?Providers:                Elon Alas. Abbey Chatters, DO, Crystal Page, [place]  ?                          Wynonia Lawman, Technician ?Referring MD:              ?Medicines:                See the Anesthesia note for documentation of the  ?                          administered medications ?Complications:            No immediate complications. ?Estimated Blood Loss:     Estimated blood loss was minimal. ?Procedure:                Pre-Anesthesia Assessment: ?                          - The anesthesia plan was to use monitored  ?                          anesthesia care (MAC). ?                          After obtaining informed consent, the colonoscope  ?                          was passed under direct vision. Throughout the  ?                          procedure, the patient's blood pressure, pulse, and  ?                          oxygen saturations were monitored continuously. The  ?                          [id number] ([id number]) was introduced through the  ?                          anus and advanced to the the cecum, identified by  ?                          appendiceal orifice and ileocecal valve. The  ?                          colonoscopy was performed without difficulty. The  ?                          patient tolerated the procedure well. The quality  ?  of the bowel preparation was evaluated using the  ?                          BBPS [facility] Bowel Preparation Scale) with scores  ?                          of: Right Colon = 3, Transverse Colon = 3 and Left  ?                          Colon = 3 (entire mucosa seen well with no residual  ?                          staining, small fragments  of stool or opaque  ?                          liquid). The total BBPS score equals 9. ?Scope In: 9:28:11 AM ?Scope Out: 9:43:39 AM ?Scope Withdrawal Time: 0 hours 11 minutes 0 seconds  ?Total Procedure Duration: 0 hours 15 minutes 28 seconds  ?Findings: ?     The perianal and digital rectal examinations were normal. ?     Non-bleeding internal hemorrhoids were found during endoscopy. ?     Multiple small-mouthed diverticula were found in the sigmoid colon. ?     Two sessile polyps were found in the ascending colon. The polyps were 5  ?     to 7 mm in size. These polyps were removed with a cold snare. Resection  ?     and retrieval were complete. ?     Two sessile polyps were found in the sigmoid colon and descending colon.  ?     The polyps were 4 to 6 mm in size. These polyps were removed with a cold  ?     snare. Resection and retrieval were complete. ?Impression:               - Non-bleeding internal hemorrhoids. ?                          - Diverticulosis in the sigmoid colon. ?                          - Two 5 to 7 mm polyps in the ascending colon,  ?                          removed with a cold snare. Resected and retrieved. ?                          - Two 4 to 6 mm polyps in the sigmoid colon and in  ?                          the descending colon, removed with a cold snare.  ?                          Resected and retrieved. ?Moderate Sedation: ?     Per Anesthesia Care ?Recommendation:           -  Patient has a contact number available for  ?                          emergencies. The signs and symptoms of potential  ?                          delayed complications were discussed with the  ?                          patient. Return to normal activities tomorrow.  ?                          Written discharge instructions were provided to the  ?                          patient. ?                          - Resume previous diet. ?                          - Continue present medications. ?                           - Await pathology results. ?                          - Repeat colonoscopy in 5 years for surveillance if  ?                          benefits outweigh risk. ?                          - Return to GI clinic PRN. ?Procedure Code(s):        --- Professional --- ?                          [id number], Colonoscopy, flexible; with removal of  ?                          tumor(s), polyp(s), or other lesion(s) by snare  ?                          technique ?Diagnosis Code(s):        --- Professional --- ?                          Z12.11, Encounter for screening for malignant  ?                          neoplasm of colon ?                          K64.8, Other hemorrhoids ?                          K63.5, Polyp of colon ?  K57.30, Diverticulosis of large intestine without  ?                          perforation or abscess without bleeding ?CPT copyright 2019 American Medical Association. All rights reserved. ?The codes documented in this report are preliminary and upon coder review may  ?be revised to meet current compliance requirements. ?Elon Alas. Abbey Chatters, DO ?Elon Alas. Cecil, DO ?06/07/2021 9:46:56 AM ?This report has been signed electronically. ?Number of Addenda: 0 ?

## 2021-06-07 NOTE — Discharge Instructions (Addendum)
?  Colonoscopy Discharge Instructions  Read the instructions outlined below and refer to this sheet in the next few weeks. These discharge instructions provide you with general information on caring for yourself after you leave the hospital. Your doctor may also give you specific instructions. While your treatment has been planned according to the most current medical practices available, unavoidable complications occasionally occur.   ACTIVITY You may resume your regular activity, but move at a slower pace for the next 24 hours.  Take frequent rest periods for the next 24 hours.  Walking will help get rid of the air and reduce the bloated feeling in your belly (abdomen).  No driving for 24 hours (because of the medicine (anesthesia) used during the test).   Do not sign any important legal documents or operate any machinery for 24 hours (because of the anesthesia used during the test).  NUTRITION Drink plenty of fluids.  You may resume your normal diet as instructed by your doctor.  Begin with a light meal and progress to your normal diet. Heavy or fried foods are harder to digest and may make you feel sick to your stomach (nauseated).  Avoid alcoholic beverages for 24 hours or as instructed.  MEDICATIONS You may resume your normal medications unless your doctor tells you otherwise.  WHAT YOU CAN EXPECT TODAY Some feelings of bloating in the abdomen.  Passage of more gas than usual.  Spotting of blood in your stool or on the toilet paper.  IF YOU HAD POLYPS REMOVED DURING THE COLONOSCOPY: No aspirin products for 7 days or as instructed.  No alcohol for 7 days or as instructed.  Eat a soft diet for the next 24 hours.  FINDING OUT THE RESULTS OF YOUR TEST Not all test results are available during your visit. If your test results are not back during the visit, make an appointment with your caregiver to find out the results. Do not assume everything is normal if you have not heard from your  caregiver or the medical facility. It is important for you to follow up on all of your test results.  SEEK IMMEDIATE MEDICAL ATTENTION IF: You have more than a spotting of blood in your stool.  Your belly is swollen (abdominal distention).  You are nauseated or vomiting.  You have a temperature over 101.  You have abdominal pain or discomfort that is severe or gets worse throughout the day.   Your colonoscopy revealed 4 polyp(s) which I removed successfully. Await pathology results, my office will contact you. I recommend repeating colonoscopy in 5 years for surveillance purposes.   You also have diverticulosis and internal hemorrhoids. I would recommend increasing fiber in your diet or adding OTC Benefiber/Metamucil. Be sure to drink at least 4 to 6 glasses of water daily. Follow-up with GI as needed.   I hope you have a great rest of your week!  Charles K. Carver, D.O. Gastroenterology and Hepatology [facility]  

## 2021-06-07 NOTE — Transfer of Care (Signed)
Immediate Anesthesia Transfer of Care Note ? ?Patient: [name] ? ?Procedure(s) Performed: COLONOSCOPY WITH PROPOFOL ?POLYPECTOMY INTESTINAL ? ?Patient Location: Endoscopy Unit ? ?Anesthesia Type:MAC ? ?Level of Consciousness: sedated and responds to stimulation ? ?Airway & Oxygen Therapy: Patient Spontanous Breathing and Patient connected to nasal cannula oxygen ? ?Post-op Assessment: Report given to RN, Post -op Vital signs reviewed and stable and Patient moving all extremities ? ?Post vital signs: Reviewed and stable ? ?Last Vitals:  ?Vitals Value Taken Time  ?BP    ?Temp    ?Pulse    ?Resp    ?SpO2    ? ? ?Last Pain:  ?Vitals:  ? 06/07/21 0947  ?TempSrc: Oral  ?PainSc:   ?   ? ?Patients Stated Pain Goal: 7 (06/07/21 1540) ? ?Complications: No notable events documented. ?

## 2021-06-07 NOTE — Anesthesia Procedure Notes (Signed)
Date/Time: 06/07/2021 9:30 AM ?Performed by: Orlie Dakin, CRNA ?Pre-anesthesia Checklist: Patient identified, Emergency Drugs available, Suction available and Patient being monitored ?Patient Re-evaluated:Patient Re-evaluated prior to induction ?Oxygen Delivery Method: Nasal cannula ?Induction Type: IV induction ?Placement Confirmation: positive ETCO2 ? ? ? ? ?

## 2021-06-07 NOTE — Interval H&P Note (Signed)
History and Physical Interval Note: ? ?06/07/2021 ?9:12 AM ? ?[name]  has presented today for surgery, with the diagnosis of screening colonoscopy.  The various methods of treatment have been discussed with the patient and family. After consideration of risks, benefits and other options for treatment, the patient has consented to  Procedure(s) with comments: ?COLONOSCOPY WITH PROPOFOL (N/A) - 9:30am as a surgical intervention.  The patient's history has been reviewed, patient examined, no change in status, stable for surgery.  I have reviewed the patient's chart and labs.  Questions were answered to the patient's satisfaction.   ? ? ?Eloise Harman ? ? ?

## 2021-06-07 NOTE — Anesthesia Postprocedure Evaluation (Signed)
Anesthesia Post Note ? ?Patient: LIZMARIE WITTERS ? ?Procedure(s) Performed: COLONOSCOPY WITH PROPOFOL ?POLYPECTOMY INTESTINAL ? ?Patient location during evaluation: Phase II ?Anesthesia Type: General ?Level of consciousness: awake ?Pain management: pain level controlled ?Vital Signs Assessment: post-procedure vital signs reviewed and stable ?Respiratory status: spontaneous breathing and respiratory function stable ?Cardiovascular status: blood pressure returned to baseline and stable ?Postop Assessment: no headache and no apparent nausea or vomiting ?Anesthetic complications: no ?Comments: Late entry ? ? ?No notable events documented. ? ? ?Last Vitals:  ?Vitals:  ? 06/07/21 0812 06/07/21 0947  ?BP: (!) 187/85 (!) 155/64  ?Pulse: 81   ?Resp: 19 20  ?Temp: 36.8 ?C 36.7 ?C  ?SpO2: 99% 97%  ?  ?Last Pain:  ?Vitals:  ? 06/07/21 0947  ?TempSrc: Oral  ?PainSc: 0-No pain  ? ? ?  ?  ?  ?  ?  ?  ? ?Louann Sjogren ? ? ? ? ?

## 2021-06-07 NOTE — Anesthesia Preprocedure Evaluation (Signed)
Anesthesia Evaluation  ?Patient identified by MRN, date of birth, ID band ?Patient awake ? ? ? ?Reviewed: ?Allergy & Precautions, H&P , NPO status , Patient's Chart, lab work & pertinent test results, reviewed documented beta blocker date and time  ? ?Airway ?Mallampati: II ? ?TM Distance: >3 FB ?Neck ROM: full ? ? ? Dental ?no notable dental hx. ? ?  ?Pulmonary ?neg pulmonary ROS, Current Smoker,  ?  ?Pulmonary exam normal ?breath sounds clear to auscultation ? ? ? ? ? ? Cardiovascular ?Exercise Tolerance: Good ?hypertension, negative cardio ROS ? ? ?Rhythm:regular Rate:Normal ? ? ?  ?Neuro/Psych ?negative neurological ROS ? negative psych ROS  ? GI/Hepatic ?Neg liver ROS, GERD  Medicated,  ?Endo/Other  ?negative endocrine ROSdiabetes, Type 2 ? Renal/GU ?negative Renal ROS  ?negative genitourinary ?  ?Musculoskeletal ? ? Abdominal ?  ?Peds ? Hematology ?negative hematology ROS ?(+)   ?Anesthesia Other Findings ? ? Reproductive/Obstetrics ?negative OB ROS ? ?  ? ? ? ? ? ? ? ? ? ? ? ? ? ?  ?  ? ? ? ? ? ? ? ? ?Anesthesia Physical ?Anesthesia Plan ? ?ASA: 3 ? ?Anesthesia Plan: General  ? ?Post-op Pain Management:   ? ?Induction:  ? ?PONV Risk Score and Plan: Propofol infusion ? ?Airway Management Planned:  ? ?Additional Equipment:  ? ?Intra-op Plan:  ? ?Post-operative Plan:  ? ?Informed Consent: I have reviewed the patients History and Physical, chart, labs and discussed the procedure including the risks, benefits and alternatives for the proposed anesthesia with the patient or authorized representative who has indicated his/her understanding and acceptance.  ? ? ? ?Dental Advisory Given ? ?Plan Discussed with: CRNA ? ?Anesthesia Plan Comments:   ? ? ? ? ? ? ?Anesthesia Quick Evaluation ? ?

## 2021-06-08 LAB — SURGICAL PATHOLOGY

## 2021-06-12 ENCOUNTER — Encounter (HOSPITAL_COMMUNITY): Payer: Self-pay | Admitting: Internal Medicine

## 2021-07-20 ENCOUNTER — Other Ambulatory Visit (HOSPITAL_COMMUNITY): Payer: Self-pay | Admitting: Family Medicine

## 2021-07-20 DIAGNOSIS — Z1231 Encounter for screening mammogram for malignant neoplasm of breast: Secondary | ICD-10-CM

## 2021-07-26 ENCOUNTER — Ambulatory Visit (HOSPITAL_COMMUNITY)
Admission: RE | Admit: 2021-07-26 | Discharge: 2021-07-26 | Disposition: A | Discharge status: Home / Self Care | Payer: Medicare Other | Source: Ambulatory Visit | Attending: Family Medicine | Admitting: Family Medicine

## 2021-07-26 DIAGNOSIS — Z1231 Encounter for screening mammogram for malignant neoplasm of breast: Secondary | ICD-10-CM | POA: Insufficient documentation

## 2021-12-13 ENCOUNTER — Other Ambulatory Visit: Payer: Self-pay | Admitting: Cardiovascular Disease

## 2022-01-02 DIAGNOSIS — R600 Localized edema: Secondary | ICD-10-CM | POA: Insufficient documentation

## 2022-03-13 ENCOUNTER — Other Ambulatory Visit: Payer: Self-pay | Admitting: Cardiovascular Disease

## 2022-05-01 ENCOUNTER — Other Ambulatory Visit (HOSPITAL_COMMUNITY): Payer: Self-pay | Admitting: Family Medicine

## 2022-05-01 DIAGNOSIS — Z1231 Encounter for screening mammogram for malignant neoplasm of breast: Secondary | ICD-10-CM

## 2022-05-14 ENCOUNTER — Other Ambulatory Visit (HOSPITAL_COMMUNITY): Payer: Self-pay | Admitting: Family Medicine

## 2022-05-14 DIAGNOSIS — I739 Peripheral vascular disease, unspecified: Secondary | ICD-10-CM

## 2022-05-21 ENCOUNTER — Ambulatory Visit (HOSPITAL_COMMUNITY)
Admission: RE | Admit: 2022-05-21 | Discharge: 2022-05-21 | Disposition: A | Discharge status: Home / Self Care | Payer: Medicare Other | Source: Ambulatory Visit | Attending: Family Medicine | Admitting: Family Medicine

## 2022-05-21 DIAGNOSIS — I739 Peripheral vascular disease, unspecified: Secondary | ICD-10-CM

## 2022-06-07 ENCOUNTER — Ambulatory Visit: Payer: Medicare Other | Admitting: Orthopedic Surgery

## 2022-06-21 ENCOUNTER — Encounter: Payer: Self-pay | Admitting: Orthopedic Surgery

## 2022-06-21 ENCOUNTER — Other Ambulatory Visit (INDEPENDENT_AMBULATORY_CARE_PROVIDER_SITE_OTHER): Payer: Medicare Other

## 2022-06-21 ENCOUNTER — Ambulatory Visit (INDEPENDENT_AMBULATORY_CARE_PROVIDER_SITE_OTHER): Payer: Medicare Other | Admitting: Orthopedic Surgery

## 2022-06-21 DIAGNOSIS — M4316 Spondylolisthesis, lumbar region: Secondary | ICD-10-CM

## 2022-06-21 DIAGNOSIS — M25552 Pain in left hip: Secondary | ICD-10-CM

## 2022-06-21 DIAGNOSIS — M545 Low back pain, unspecified: Secondary | ICD-10-CM

## 2022-06-21 DIAGNOSIS — M47816 Spondylosis without myelopathy or radiculopathy, lumbar region: Secondary | ICD-10-CM

## 2022-06-21 DIAGNOSIS — M25551 Pain in right hip: Secondary | ICD-10-CM | POA: Diagnosis not present
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
## 2022-07-09 ENCOUNTER — Other Ambulatory Visit (HOSPITAL_COMMUNITY): Payer: Self-pay | Admitting: Physician Assistant

## 2022-07-09 DIAGNOSIS — M4316 Spondylolisthesis, lumbar region: Secondary | ICD-10-CM

## 2022-07-30 ENCOUNTER — Ambulatory Visit (HOSPITAL_COMMUNITY)
Admission: RE | Admit: 2022-07-30 | Discharge: 2022-07-30 | Disposition: A | Discharge status: Home / Self Care | Payer: Medicare Other | Source: Ambulatory Visit | Attending: Family Medicine | Admitting: Family Medicine

## 2022-07-30 ENCOUNTER — Ambulatory Visit (HOSPITAL_COMMUNITY)
Admission: RE | Admit: 2022-07-30 | Discharge: 2022-07-30 | Disposition: A | Discharge status: Home / Self Care | Payer: Medicare Other | Source: Ambulatory Visit | Attending: Physician Assistant | Admitting: Physician Assistant

## 2022-07-30 DIAGNOSIS — M4316 Spondylolisthesis, lumbar region: Secondary | ICD-10-CM

## 2022-07-30 DIAGNOSIS — Z1231 Encounter for screening mammogram for malignant neoplasm of breast: Secondary | ICD-10-CM

## 2022-08-30 ENCOUNTER — Other Ambulatory Visit (HOSPITAL_COMMUNITY): Payer: Self-pay | Admitting: Family Medicine

## 2022-08-30 ENCOUNTER — Ambulatory Visit (HOSPITAL_COMMUNITY)
Admission: RE | Admit: 2022-08-30 | Discharge: 2022-08-30 | Disposition: A | Discharge status: Home / Self Care | Payer: Medicare Other | Source: Ambulatory Visit | Attending: Family Medicine | Admitting: Family Medicine

## 2022-08-30 DIAGNOSIS — J449 Chronic obstructive pulmonary disease, unspecified: Secondary | ICD-10-CM | POA: Diagnosis present

## 2022-08-30 DIAGNOSIS — R053 Chronic cough: Secondary | ICD-10-CM | POA: Diagnosis present

## 2022-09-11 ENCOUNTER — Other Ambulatory Visit (HOSPITAL_COMMUNITY): Payer: Self-pay | Admitting: Family Medicine

## 2022-09-11 DIAGNOSIS — R9389 Abnormal findings on diagnostic imaging of other specified body structures: Secondary | ICD-10-CM

## 2022-10-17 ENCOUNTER — Ambulatory Visit (HOSPITAL_COMMUNITY)
Admission: RE | Admit: 2022-10-17 | Discharge: 2022-10-17 | Disposition: A | Discharge status: Home / Self Care | Payer: Medicare Other | Source: Ambulatory Visit | Attending: Family Medicine | Admitting: Family Medicine

## 2022-10-17 DIAGNOSIS — R9389 Abnormal findings on diagnostic imaging of other specified body structures: Secondary | ICD-10-CM | POA: Insufficient documentation

## 2022-10-26 ENCOUNTER — Telehealth: Payer: Self-pay

## 2022-10-26 NOTE — Telephone Encounter (Signed)
Pts last OV 01/18/21. Pt will need in-office visit for preop clearance.      Pre-operative Risk Assessment    Patient Name: [name]  DOB: 29-Feb-1946 MRN: [id number]      Request for Surgical Clearance    Procedure:   L4-5, L5-S1 Laminectomy/Transforaminal Lumbar Fusion  Date of Surgery:  Clearance TBD                                 Surgeon:  Dr. Autumn Patty Surgeon's Group or Practice Name:  [facility] Phone number:  [phone] Fax number:  [phone]   Type of Clearance Requested:   - Medical  - Pharmacy:  Hold Aspirin pt will need instructions on when/if to hold   Type of Anesthesia:  General    Additional requests/questions:    SignedZada Finders   10/26/2022, 4:22 PM
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
## 2022-11-05 ENCOUNTER — Ambulatory Visit: Payer: Medicare Other | Attending: General Practice | Admitting: General Practice

## 2022-11-05 ENCOUNTER — Encounter: Payer: Self-pay | Admitting: General Practice

## 2022-11-05 VITALS — BP 128/62 | HR 63 | Ht 69.0 in | Wt 179.0 lb

## 2022-11-05 DIAGNOSIS — Z01810 Encounter for preprocedural cardiovascular examination: Secondary | ICD-10-CM

## 2022-11-05 DIAGNOSIS — I10 Essential (primary) hypertension: Secondary | ICD-10-CM | POA: Diagnosis not present

## 2022-11-05 DIAGNOSIS — E1122 Type 2 diabetes mellitus with diabetic chronic kidney disease: Secondary | ICD-10-CM

## 2022-11-05 DIAGNOSIS — N1832 Chronic kidney disease, stage 3b: Secondary | ICD-10-CM

## 2022-11-05 DIAGNOSIS — E7800 Pure hypercholesterolemia, unspecified: Secondary | ICD-10-CM

## 2022-11-05 DIAGNOSIS — R002 Palpitations: Secondary | ICD-10-CM

## 2022-11-05 DIAGNOSIS — Z7984 Long term (current) use of oral hypoglycemic drugs: Secondary | ICD-10-CM

## 2022-11-05 DIAGNOSIS — R0602 Shortness of breath: Secondary | ICD-10-CM

## 2022-11-05 NOTE — Patient Instructions (Signed)
Medication Instructions:  The current medical regimen is effective;  continue present plan and medications as directed. Please refer to the Current Medication list given to you today.  *If you need a refill on your cardiac medications before your next appointment, please call your pharmacy*  Lab Work: NONE If you have labs (blood work) drawn today and your tests are completely normal, you will receive your results only by:   MyChart Message (if you have MyChart) OR  A paper copy in the mail If you have any lab test that is abnormal or we need to change your treatment, we will call you to review the results.  Testing/Procedures: NONE  Other Instructions OK FOR SURGERY   Follow-Up: At [facility], you and your health needs are our priority.  As part of our continuing mission to provide you with exceptional heart care, we have created designated Provider Care Teams.  These Care Teams include your primary Cardiologist (physician) and Advanced Practice Providers (APPs -  Physician Assistants and Nurse Practitioners) who all work together to provide you with the care you need, when you need it.  We recommend signing up for the patient portal called "MyChart".  Sign up information is provided on this After Visit Summary.  MyChart is used to connect with patients for Virtual Visits (Telemedicine).  Patients are able to view lab/test results, encounter notes, upcoming appointments, etc.  Non-urgent messages can be sent to your provider as well.   To learn more about what you can do with MyChart, go to [email or web address].    Your next appointment:   12 month(s)  Provider:   Thurmon Fair, MD

## 2022-11-07 ENCOUNTER — Other Ambulatory Visit: Payer: Self-pay | Admitting: Neurological Surgery

## 2022-11-23 ENCOUNTER — Encounter (HOSPITAL_COMMUNITY): Payer: Self-pay

## 2022-11-23 NOTE — Pre-Procedure Instructions (Signed)
Surgical Instructions   Your procedure is scheduled on December 04, 2022. Report to [facility] Main Entrance "A" at 8:45 A.M., then check in with the Admitting office. Any questions or running late day of surgery: call [phone]  Questions prior to your surgery date: call [phone], Monday-Friday, 8am-4pm. If you experience any cold or flu symptoms such as cough, fever, chills, shortness of breath, etc. between now and your scheduled surgery, please notify us at the above number.     Remember:  Do not eat or drink after midnight the night before your surgery    Take these medicines the morning of surgery with A SIP OF WATER: bisoprolol (ZEBETA)  NIFEdipine (PROCARDIA XL/NIFEDICAL-XL)  pantoprazole (PROTONIX)    May take these medicines IF NEEDED: acetaminophen (TYLENOL)  albuterol (VENTOLIN HFA) inhaler  HYDROcodone-acetaminophen (NORCO/VICODIN)    Follow your surgeon's instructions on when to stop Aspirin.  If no instructions were given by your surgeon then you will need to call the office to get those instructions.     One week prior to surgery, STOP taking any Aleve, Naproxen, Ibuprofen, Motrin, Advil, Goody's, BC's, all herbal medications, fish oil, and non-prescription vitamins.   WHAT DO I DO ABOUT MY DIABETES MEDICATION?  STOP taking JARDIANCE three days prior to surgery. Your last dose will be August 30th.    Do not take linagliptin (TRADJENTA) the morning of surgery.   HOW TO MANAGE YOUR DIABETES BEFORE AND AFTER SURGERY  Why is it important to control my blood sugar before and after surgery? Improving blood sugar levels before and after surgery helps healing and can limit problems. A way of improving blood sugar control is eating a healthy diet by:  Eating less sugar and carbohydrates  Increasing activity/exercise  Talking with your doctor about reaching your blood sugar goals High blood sugars (greater than 180 mg/dL) can raise your risk of infections  and slow your recovery, so you will need to focus on controlling your diabetes during the weeks before surgery. Make sure that the doctor who takes care of your diabetes knows about your planned surgery including the date and location.  How do I manage my blood sugar before surgery? Check your blood sugar at least 4 times a day, starting 2 days before surgery, to make sure that the level is not too high or low.  Check your blood sugar the morning of your surgery when you wake up and every 2 hours until you get to the Short Stay unit.  If your blood sugar is less than 70 mg/dL, you will need to treat for low blood sugar: Do not take insulin. Treat a low blood sugar (less than 70 mg/dL) with  cup of clear juice (cranberry or apple), 4 glucose tablets, OR glucose gel. Recheck blood sugar in 15 minutes after treatment (to make sure it is greater than 70 mg/dL). If your blood sugar is not greater than 70 mg/dL on recheck, call [phone] for further instructions. Report your blood sugar to the short stay nurse when you get to Short Stay.  If you are admitted to the hospital after surgery: Your blood sugar will be checked by the staff and you will probably be given insulin after surgery (instead of oral diabetes medicines) to make sure you have good blood sugar levels. The goal for blood sugar control after surgery is 80-180 mg/dL.                      Do NOT  Smoke (Tobacco/Vaping) for 24 hours prior to your procedure.  If you use a CPAP at night, you may bring your mask/headgear for your overnight stay.   You will be asked to remove any contacts, glasses, piercing's, hearing aid's, dentures/partials prior to surgery. Please bring cases for these items if needed.    Patients discharged the day of surgery will not be allowed to drive home, and someone needs to stay with them for 24 hours.  SURGICAL WAITING ROOM VISITATION Patients may have no more than 2 support people in the waiting area -  these visitors may rotate.   Pre-op nurse will coordinate an appropriate time for 1 ADULT support person, who may not rotate, to accompany patient in pre-op.  Children under the age of 72 must have an adult with them who is not the patient and must remain in the main waiting area with an adult.  If the patient needs to stay at the hospital during part of their recovery, the visitor guidelines for inpatient rooms apply.  Please refer to the [facility] website for the visitor guidelines for any additional information.   If you received a COVID test during your pre-op visit  it is requested that you wear a mask when out in public, stay away from anyone that may not be feeling well and notify your surgeon if you develop symptoms. If you have been in contact with anyone that has tested positive in the last 10 days please notify you surgeon.      Pre-operative 5 CHG Bathing Instructions   You can play a key role in reducing the risk of infection after surgery. Your skin needs to be as free of germs as possible. You can reduce the number of germs on your skin by washing with CHG (chlorhexidine gluconate) soap before surgery. CHG is an antiseptic soap that kills germs and continues to kill germs even after washing.   DO NOT use if you have an allergy to chlorhexidine/CHG or antibacterial soaps. If your skin becomes reddened or irritated, stop using the CHG and notify one of our RNs at [phone].   Please shower with the CHG soap starting 4 days before surgery using the following schedule:     Please keep in mind the following:  DO NOT shave, including legs and underarms, starting the day of your first shower.   You may shave your face at any point before/day of surgery.  Place clean sheets on your bed the day you start using CHG soap. Use a clean washcloth (not used since being washed) for each shower. DO NOT sleep with pets once you start using the CHG.   CHG Shower Instructions:  If you  choose to wash your hair and private area, wash first with your normal shampoo/soap.  After you use shampoo/soap, rinse your hair and body thoroughly to remove shampoo/soap residue.  Turn the water OFF and apply about 3 tablespoons (45 ml) of CHG soap to a CLEAN washcloth.  Apply CHG soap ONLY FROM YOUR NECK DOWN TO YOUR TOES (washing for 3-5 minutes)  DO NOT use CHG soap on face, private areas, open wounds, or sores.  Pay special attention to the area where your surgery is being performed.  If you are having back surgery, having someone wash your back for you may be helpful. Wait 2 minutes after CHG soap is applied, then you may rinse off the CHG soap.  Pat dry with a clean towel  Put on clean clothes/pajamas  If you choose to wear lotion, please use ONLY the CHG-compatible lotions on the back of this paper.   Additional instructions for the day of surgery: DO NOT APPLY any lotions, deodorants, cologne, or perfumes.   Do not bring valuables to the hospital. [facility] is not responsible for any belongings/valuables. Do not wear nail polish, gel polish, artificial nails, or any other type of covering on natural nails (fingers and toes) Do not wear jewelry or makeup Put on clean/comfortable clothes.  Please brush your teeth.  Ask your nurse before applying any prescription medications to the skin.     CHG Compatible Lotions   Aveeno Moisturizing lotion  Cetaphil Moisturizing Cream  Cetaphil Moisturizing Lotion  Clairol Herbal Essence Moisturizing Lotion, Dry Skin  Clairol Herbal Essence Moisturizing Lotion, Extra Dry Skin  Clairol Herbal Essence Moisturizing Lotion, Normal Skin  Curel Age Defying Therapeutic Moisturizing Lotion with Alpha Hydroxy  Curel Extreme Care Body Lotion  Curel Soothing Hands Moisturizing Hand Lotion  Curel Therapeutic Moisturizing Cream, Fragrance-Free  Curel Therapeutic Moisturizing Lotion, Fragrance-Free  Curel Therapeutic Moisturizing Lotion, Original  Formula  Eucerin Daily Replenishing Lotion  Eucerin Dry Skin Therapy Plus Alpha Hydroxy Crme  Eucerin Dry Skin Therapy Plus Alpha Hydroxy Lotion  Eucerin Original Crme  Eucerin Original Lotion  Eucerin Plus Crme Eucerin Plus Lotion  Eucerin TriLipid Replenishing Lotion  Keri Anti-Bacterial Hand Lotion  Keri Deep Conditioning Original Lotion Dry Skin Formula Softly Scented  Keri Deep Conditioning Original Lotion, Fragrance Free Sensitive Skin Formula  Keri Lotion Fast Absorbing Fragrance Free Sensitive Skin Formula  Keri Lotion Fast Absorbing Softly Scented Dry Skin Formula  Keri Original Lotion  Keri Skin Renewal Lotion Keri Silky Smooth Lotion  Keri Silky Smooth Sensitive Skin Lotion  Nivea Body Creamy Conditioning Oil  Nivea Body Extra Enriched Lotion  Nivea Body Original Lotion  Nivea Body Sheer Moisturizing Lotion Nivea Crme  Nivea Skin Firming Lotion  NutraDerm 30 Skin Lotion  NutraDerm Skin Lotion  NutraDerm Therapeutic Skin Cream  NutraDerm Therapeutic Skin Lotion  ProShield Protective Hand Cream  Provon moisturizing lotion  Please read over the following fact sheets that you were given.

## 2022-11-26 ENCOUNTER — Other Ambulatory Visit: Payer: Self-pay

## 2022-11-26 ENCOUNTER — Encounter (HOSPITAL_COMMUNITY): Payer: Self-pay

## 2022-11-26 ENCOUNTER — Encounter (HOSPITAL_COMMUNITY)
Admission: RE | Admit: 2022-11-26 | Discharge: 2022-11-26 | Disposition: A | Discharge status: Home / Self Care | Payer: Medicare Other | Source: Ambulatory Visit | Attending: Neurological Surgery | Admitting: Neurological Surgery

## 2022-11-26 VITALS — BP 155/75 | HR 75 | Temp 98.4°F | Resp 20 | Ht 70.0 in | Wt 177.3 lb

## 2022-11-26 DIAGNOSIS — M47816 Spondylosis without myelopathy or radiculopathy, lumbar region: Secondary | ICD-10-CM | POA: Diagnosis not present

## 2022-11-26 DIAGNOSIS — I129 Hypertensive chronic kidney disease with stage 1 through stage 4 chronic kidney disease, or unspecified chronic kidney disease: Secondary | ICD-10-CM | POA: Insufficient documentation

## 2022-11-26 DIAGNOSIS — E785 Hyperlipidemia, unspecified: Secondary | ICD-10-CM | POA: Diagnosis not present

## 2022-11-26 DIAGNOSIS — E049 Nontoxic goiter, unspecified: Secondary | ICD-10-CM | POA: Diagnosis not present

## 2022-11-26 DIAGNOSIS — R7983 Abnormal findings of blood amino-acid level: Secondary | ICD-10-CM | POA: Insufficient documentation

## 2022-11-26 DIAGNOSIS — Z79899 Other long term (current) drug therapy: Secondary | ICD-10-CM | POA: Insufficient documentation

## 2022-11-26 DIAGNOSIS — Z7984 Long term (current) use of oral hypoglycemic drugs: Secondary | ICD-10-CM | POA: Diagnosis not present

## 2022-11-26 DIAGNOSIS — M4316 Spondylolisthesis, lumbar region: Secondary | ICD-10-CM | POA: Insufficient documentation

## 2022-11-26 DIAGNOSIS — N183 Chronic kidney disease, stage 3 unspecified: Secondary | ICD-10-CM | POA: Insufficient documentation

## 2022-11-26 DIAGNOSIS — Z01812 Encounter for preprocedural laboratory examination: Secondary | ICD-10-CM | POA: Insufficient documentation

## 2022-11-26 DIAGNOSIS — K219 Gastro-esophageal reflux disease without esophagitis: Secondary | ICD-10-CM | POA: Diagnosis not present

## 2022-11-26 DIAGNOSIS — J449 Chronic obstructive pulmonary disease, unspecified: Secondary | ICD-10-CM | POA: Diagnosis not present

## 2022-11-26 DIAGNOSIS — F17200 Nicotine dependence, unspecified, uncomplicated: Secondary | ICD-10-CM | POA: Insufficient documentation

## 2022-11-26 DIAGNOSIS — M48061 Spinal stenosis, lumbar region without neurogenic claudication: Secondary | ICD-10-CM | POA: Insufficient documentation

## 2022-11-26 DIAGNOSIS — E1122 Type 2 diabetes mellitus with diabetic chronic kidney disease: Secondary | ICD-10-CM | POA: Diagnosis not present

## 2022-11-26 DIAGNOSIS — Z86718 Personal history of other venous thrombosis and embolism: Secondary | ICD-10-CM | POA: Insufficient documentation

## 2022-11-26 DIAGNOSIS — Z7982 Long term (current) use of aspirin: Secondary | ICD-10-CM | POA: Diagnosis not present

## 2022-11-26 DIAGNOSIS — Z01818 Encounter for other preprocedural examination: Secondary | ICD-10-CM

## 2022-11-26 HISTORY — DX: Chronic obstructive pulmonary disease, unspecified: J44.9

## 2022-11-26 HISTORY — DX: Chronic kidney disease, unspecified: N18.9

## 2022-11-26 LAB — BASIC METABOLIC PANEL
Anion gap: 9 (ref 5–15)
BUN: 22 mg/dL (ref 8–23)
CO2: 21 mmol/L — ABNORMAL LOW (ref 22–32)
Calcium: 9.8 mg/dL (ref 8.9–10.3)
Chloride: 108 mmol/L (ref 98–111)
Creatinine, Ser: 1.91 mg/dL — ABNORMAL HIGH (ref 0.44–1.00)
GFR, Estimated: 26 mL/min — ABNORMAL LOW (ref >60)
Glucose, Bld: 127 mg/dL — ABNORMAL HIGH (ref 70–99)
Potassium: 4.4 mmol/L (ref 3.5–5.1)
Sodium: 138 mmol/L (ref 135–145)

## 2022-11-26 LAB — HEMOGLOBIN A1C
Hgb A1c MFr Bld: 8.9 % — ABNORMAL HIGH (ref 4.8–5.6)
Mean Plasma Glucose: 208.73 mg/dL

## 2022-11-26 LAB — CBC
HCT: 40.4 % (ref 36.0–46.0)
Hemoglobin: 12.5 g/dL (ref 12.0–15.0)
MCH: 28.1 pg (ref 26.0–34.0)
MCHC: 30.9 g/dL (ref 30.0–36.0)
MCV: 90.8 fL (ref 80.0–100.0)
Platelets: 295 10*3/uL (ref 150–400)
RBC: 4.45 MIL/uL (ref 3.87–5.11)
RDW: 15.5 % (ref 11.5–15.5)
WBC: 6.2 10*3/uL (ref 4.0–10.5)
nRBC: 0 % (ref 0.0–0.2)

## 2022-11-26 LAB — SURGICAL PCR SCREEN
MRSA, PCR: NEGATIVE
Staphylococcus aureus: NEGATIVE

## 2022-11-26 LAB — TYPE AND SCREEN
ABO/RH(D): O POS
Antibody Screen: NEGATIVE

## 2022-11-26 LAB — GLUCOSE, CAPILLARY: Glucose-Capillary: 164 mg/dL — ABNORMAL HIGH (ref 70–99)

## 2022-11-26 NOTE — Progress Notes (Signed)
PCP - Jeannetta Ellis Cardiologist - Mihai Croitoru,MD Nephrologist - Lamont Dowdy, MD  PPM/ICD - denies Device Orders -  Rep Notified -   Chest x-ray - 08/30/22 EKG - 11/05/22 Stress Test - 12/26/16 ECHO - 01/09/21 Cardiac Cath - denies  Sleep Study - 03/13/2007 CPAP - no  Fasting Blood Sugar - 150's Checks Blood Sugar one time a day  Last dose of GLP1 agonist-  na GLP1 instructions:   Blood Thinner Instructions:na Aspirin Instructions: per Dr. Marcy Siren instructions to patient, she is to follow prescribing physicians instructions regarding when to stop aspirin prior to surgery. She states she will call Dr. Phillips Odor when she gets home. He prescribes her aspirin.   ERAS Protcol -no PRE-SURGERY Ensure or G2- no  COVID TEST- na   Anesthesia review: yes-cardiac pre op eval 11/05/22-hx essential hypertension,palpitations  Patient denies shortness of breath, fever, cough and chest pain at PAT appointment   All instructions explained to the patient, with a verbal understanding of the material. Patient agrees to go over the instructions while at home for a better understanding. Patient also instructed to wear a mask when out in public prior to surgery. The opportunity to ask questions was provided.

## 2022-11-26 NOTE — Pre-Procedure Instructions (Signed)
Surgical Instructions   Your procedure is scheduled on December 04, 2022. Report to [facility] Main Entrance "A" at 8:45 A.M., then check in with the Admitting office. Any questions or running late day of surgery: call [phone]  Questions prior to your surgery date: call [phone], Monday-Friday, 8am-4pm. If you experience any cold or flu symptoms such as cough, fever, chills, shortness of breath, etc. between now and your scheduled surgery, please notify us at the above number.     Remember:  Do not eat or drink after midnight the night before your surgery    Take these medicines the morning of surgery with A SIP OF WATER: bisoprolol (ZEBETA)  NIFEdipine (PROCARDIA XL/NIFEDICAL-XL)  pantoprazole (PROTONIX)    May take these medicines IF NEEDED: acetaminophen (TYLENOL)  albuterol (VENTOLIN HFA) inhaler  HYDROcodone-acetaminophen (NORCO/VICODIN)    Follow your surgeon's instructions on when to stop Aspirin.  If no instructions were given by your surgeon then you will need to call the office to get those instructions.     One week prior to surgery, STOP taking any Aleve, Naproxen, Ibuprofen, Motrin, Advil, Goody's, BC's, all herbal medications, fish oil, and non-prescription vitamins.   WHAT DO I DO ABOUT MY DIABETES MEDICATION?  STOP taking JARDIANCE three days prior to surgery. Your last dose will be August 30th.    Do not take linagliptin (TRADJENTA) the morning of surgery.   DO NOT TAKE METFORMIN(GLUCOPHAGE) THE MORNING OF SURGERY.  HOW TO MANAGE YOUR DIABETES BEFORE AND AFTER SURGERY  Why is it important to control my blood sugar before and after surgery? Improving blood sugar levels before and after surgery helps healing and can limit problems. A way of improving blood sugar control is eating a healthy diet by:  Eating less sugar and carbohydrates  Increasing activity/exercise  Talking with your doctor about reaching your blood sugar goals High blood  sugars (greater than 180 mg/dL) can raise your risk of infections and slow your recovery, so you will need to focus on controlling your diabetes during the weeks before surgery. Make sure that the doctor who takes care of your diabetes knows about your planned surgery including the date and location.  How do I manage my blood sugar before surgery? Check your blood sugar at least 4 times a day, starting 2 days before surgery, to make sure that the level is not too high or low.  Check your blood sugar the morning of your surgery when you wake up and every 2 hours until you get to the Short Stay unit.  If your blood sugar is less than 70 mg/dL, you will need to treat for low blood sugar: Do not take insulin. Treat a low blood sugar (less than 70 mg/dL) with  cup of clear juice (cranberry or apple), 4 glucose tablets, OR glucose gel. Recheck blood sugar in 15 minutes after treatment (to make sure it is greater than 70 mg/dL). If your blood sugar is not greater than 70 mg/dL on recheck, call [phone] for further instructions. Report your blood sugar to the short stay nurse when you get to Short Stay.  If you are admitted to the hospital after surgery: Your blood sugar will be checked by the staff and you will probably be given insulin after surgery (instead of oral diabetes medicines) to make sure you have good blood sugar levels. The goal for blood sugar control after surgery is 80-180 mg/dL.  Do NOT Smoke (Tobacco/Vaping) for 24 hours prior to your procedure.  If you use a CPAP at night, you may bring your mask/headgear for your overnight stay.   You will be asked to remove any contacts, glasses, piercing's, hearing aid's, dentures/partials prior to surgery. Please bring cases for these items if needed.    Patients discharged the day of surgery will not be allowed to drive home, and someone needs to stay with them for 24 hours.  SURGICAL WAITING ROOM  VISITATION Patients may have no more than 2 support people in the waiting area - these visitors may rotate.   Pre-op nurse will coordinate an appropriate time for 1 ADULT support person, who may not rotate, to accompany patient in pre-op.  Children under the age of 28 must have an adult with them who is not the patient and must remain in the main waiting area with an adult.  If the patient needs to stay at the hospital during part of their recovery, the visitor guidelines for inpatient rooms apply.  Please refer to the [facility] website for the visitor guidelines for any additional information.   If you received a COVID test during your pre-op visit  it is requested that you wear a mask when out in public, stay away from anyone that may not be feeling well and notify your surgeon if you develop symptoms. If you have been in contact with anyone that has tested positive in the last 10 days please notify you surgeon.      Pre-operative 5 CHG Bathing Instructions   You can play a key role in reducing the risk of infection after surgery. Your skin needs to be as free of germs as possible. You can reduce the number of germs on your skin by washing with CHG (chlorhexidine gluconate) soap before surgery. CHG is an antiseptic soap that kills germs and continues to kill germs even after washing.   DO NOT use if you have an allergy to chlorhexidine/CHG or antibacterial soaps. If your skin becomes reddened or irritated, stop using the CHG and notify one of our RNs at [phone].   Please shower with the CHG soap starting 4 days before surgery using the following schedule:     Please keep in mind the following:  DO NOT shave, including legs and underarms, starting the day of your first shower.   You may shave your face at any point before/day of surgery.  Place clean sheets on your bed the day you start using CHG soap. Use a clean washcloth (not used since being washed) for each shower. DO NOT  sleep with pets once you start using the CHG.   CHG Shower Instructions:  If you choose to wash your hair and private area, wash first with your normal shampoo/soap.  After you use shampoo/soap, rinse your hair and body thoroughly to remove shampoo/soap residue.  Turn the water OFF and apply about 3 tablespoons (45 ml) of CHG soap to a CLEAN washcloth.  Apply CHG soap ONLY FROM YOUR NECK DOWN TO YOUR TOES (washing for 3-5 minutes)  DO NOT use CHG soap on face, private areas, open wounds, or sores.  Pay special attention to the area where your surgery is being performed.  If you are having back surgery, having someone wash your back for you may be helpful. Wait 2 minutes after CHG soap is applied, then you may rinse off the CHG soap.  Pat dry with a clean towel  Put on clean  clothes/pajamas   If you choose to wear lotion, please use ONLY the CHG-compatible lotions on the back of this paper.   Additional instructions for the day of surgery: DO NOT APPLY any lotions, deodorants, cologne, or perfumes.   Do not bring valuables to the hospital. [facility] is not responsible for any belongings/valuables. Do not wear nail polish, gel polish, artificial nails, or any other type of covering on natural nails (fingers and toes) Do not wear jewelry or makeup Put on clean/comfortable clothes.  Please brush your teeth.  Ask your nurse before applying any prescription medications to the skin.     CHG Compatible Lotions   Aveeno Moisturizing lotion  Cetaphil Moisturizing Cream  Cetaphil Moisturizing Lotion  Clairol Herbal Essence Moisturizing Lotion, Dry Skin  Clairol Herbal Essence Moisturizing Lotion, Extra Dry Skin  Clairol Herbal Essence Moisturizing Lotion, Normal Skin  Curel Age Defying Therapeutic Moisturizing Lotion with Alpha Hydroxy  Curel Extreme Care Body Lotion  Curel Soothing Hands Moisturizing Hand Lotion  Curel Therapeutic Moisturizing Cream, Fragrance-Free  Curel Therapeutic  Moisturizing Lotion, Fragrance-Free  Curel Therapeutic Moisturizing Lotion, Original Formula  Eucerin Daily Replenishing Lotion  Eucerin Dry Skin Therapy Plus Alpha Hydroxy Crme  Eucerin Dry Skin Therapy Plus Alpha Hydroxy Lotion  Eucerin Original Crme  Eucerin Original Lotion  Eucerin Plus Crme Eucerin Plus Lotion  Eucerin TriLipid Replenishing Lotion  Keri Anti-Bacterial Hand Lotion  Keri Deep Conditioning Original Lotion Dry Skin Formula Softly Scented  Keri Deep Conditioning Original Lotion, Fragrance Free Sensitive Skin Formula  Keri Lotion Fast Absorbing Fragrance Free Sensitive Skin Formula  Keri Lotion Fast Absorbing Softly Scented Dry Skin Formula  Keri Original Lotion  Keri Skin Renewal Lotion Keri Silky Smooth Lotion  Keri Silky Smooth Sensitive Skin Lotion  Nivea Body Creamy Conditioning Oil  Nivea Body Extra Enriched Lotion  Nivea Body Original Lotion  Nivea Body Sheer Moisturizing Lotion Nivea Crme  Nivea Skin Firming Lotion  NutraDerm 30 Skin Lotion  NutraDerm Skin Lotion  NutraDerm Therapeutic Skin Cream  NutraDerm Therapeutic Skin Lotion  ProShield Protective Hand Cream  Provon moisturizing lotion  Please read over the following fact sheets that you were given.

## 2022-11-27 ENCOUNTER — Encounter (HOSPITAL_COMMUNITY): Payer: Self-pay

## 2022-11-27 NOTE — Anesthesia Preprocedure Evaluation (Addendum)
Anesthesia Evaluation  Patient identified by MRN, date of birth, ID band Patient awake    Reviewed: Allergy & Precautions, H&P , NPO status , Patient's Chart, lab work & pertinent test results, reviewed documented beta blocker date and time   Airway Mallampati: I       Dental no notable dental hx.    Pulmonary Current Smoker and Patient abstained from smoking.   Pulmonary exam normal        Cardiovascular Exercise Tolerance: Good hypertension, Pt. on medications and Pt. on home beta blockers negative cardio ROS Normal cardiovascular exam     Neuro/Psych negative neurological ROS  negative psych ROS   GI/Hepatic Neg liver ROS,GERD  Medicated,,  Endo/Other  negative endocrine ROSdiabetes, Type 2, Oral Hypoglycemic Agents    Renal/GU Renal InsufficiencyRenal disease  negative genitourinary   Musculoskeletal  (+) Arthritis , Osteoarthritis,    Abdominal Normal abdominal exam  (+)   Peds  Hematology negative hematology ROS (+)   Anesthesia Other Findings   Reproductive/Obstetrics negative OB ROS                             Anesthesia Physical Anesthesia Plan  ASA: 3  Anesthesia Plan: General   Post-op Pain Management:    Induction: Intravenous  PONV Risk Score and Plan: Ondansetron and Treatment may vary due to age or medical condition  Airway Management Planned: Oral ETT  Additional Equipment: None  Intra-op Plan:   Post-operative Plan: Extubation in OR  Informed Consent: I have reviewed the patients History and Physical, chart, labs and discussed the procedure including the risks, benefits and alternatives for the proposed anesthesia with the patient or authorized representative who has indicated his/her understanding and acceptance.     Dental Advisory Given  Plan Discussed with: CRNA  Anesthesia Plan Comments: (PAT note written 11/27/2022 by Shonna Chock, PA-C. She  has a multinodular goiter with substernal extension, no significant tracheal findings per 10/17/22 CT Chest.   )        Anesthesia Quick Evaluation
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
## 2022-12-04 ENCOUNTER — Inpatient Hospital Stay (HOSPITAL_COMMUNITY): Payer: Medicare Other | Admitting: Vascular Surgery

## 2022-12-04 ENCOUNTER — Encounter (HOSPITAL_COMMUNITY)
Admission: RE | Disposition: A | Discharge status: Skilled Nursing Facility | Payer: Self-pay | Source: Home / Self Care | Attending: Neurological Surgery

## 2022-12-04 ENCOUNTER — Other Ambulatory Visit: Payer: Self-pay

## 2022-12-04 ENCOUNTER — Inpatient Hospital Stay (HOSPITAL_COMMUNITY): Payer: Medicare Other

## 2022-12-04 ENCOUNTER — Encounter (HOSPITAL_COMMUNITY): Payer: Self-pay | Admitting: Neurological Surgery

## 2022-12-04 ENCOUNTER — Inpatient Hospital Stay (HOSPITAL_COMMUNITY)
Admission: RE | Admit: 2022-12-04 | Discharge: 2022-12-10 | DRG: 454 | Disposition: A | Discharge status: Skilled Nursing Facility | Payer: Medicare Other | Attending: Neurological Surgery | Admitting: Neurological Surgery

## 2022-12-04 DIAGNOSIS — I129 Hypertensive chronic kidney disease with stage 1 through stage 4 chronic kidney disease, or unspecified chronic kidney disease: Secondary | ICD-10-CM | POA: Diagnosis present

## 2022-12-04 DIAGNOSIS — E1165 Type 2 diabetes mellitus with hyperglycemia: Secondary | ICD-10-CM | POA: Diagnosis present

## 2022-12-04 DIAGNOSIS — Z7984 Long term (current) use of oral hypoglycemic drugs: Secondary | ICD-10-CM | POA: Diagnosis not present

## 2022-12-04 DIAGNOSIS — M5416 Radiculopathy, lumbar region: Secondary | ICD-10-CM | POA: Diagnosis present

## 2022-12-04 DIAGNOSIS — M4317 Spondylolisthesis, lumbosacral region: Secondary | ICD-10-CM

## 2022-12-04 DIAGNOSIS — Z86718 Personal history of other venous thrombosis and embolism: Secondary | ICD-10-CM

## 2022-12-04 DIAGNOSIS — K219 Gastro-esophageal reflux disease without esophagitis: Secondary | ICD-10-CM | POA: Diagnosis present

## 2022-12-04 DIAGNOSIS — M109 Gout, unspecified: Secondary | ICD-10-CM | POA: Diagnosis present

## 2022-12-04 DIAGNOSIS — N184 Chronic kidney disease, stage 4 (severe): Secondary | ICD-10-CM | POA: Diagnosis present

## 2022-12-04 DIAGNOSIS — F17210 Nicotine dependence, cigarettes, uncomplicated: Secondary | ICD-10-CM | POA: Diagnosis present

## 2022-12-04 DIAGNOSIS — Z751 Person awaiting admission to adequate facility elsewhere: Secondary | ICD-10-CM

## 2022-12-04 DIAGNOSIS — M4807 Spinal stenosis, lumbosacral region: Secondary | ICD-10-CM | POA: Diagnosis present

## 2022-12-04 DIAGNOSIS — Z888 Allergy status to other drugs, medicaments and biological substances status: Secondary | ICD-10-CM | POA: Diagnosis not present

## 2022-12-04 DIAGNOSIS — Z01818 Encounter for other preprocedural examination: Secondary | ICD-10-CM

## 2022-12-04 DIAGNOSIS — M1990 Unspecified osteoarthritis, unspecified site: Secondary | ICD-10-CM | POA: Diagnosis present

## 2022-12-04 DIAGNOSIS — Z885 Allergy status to narcotic agent status: Secondary | ICD-10-CM | POA: Diagnosis not present

## 2022-12-04 DIAGNOSIS — Z8249 Family history of ischemic heart disease and other diseases of the circulatory system: Secondary | ICD-10-CM

## 2022-12-04 DIAGNOSIS — Z8042 Family history of malignant neoplasm of prostate: Secondary | ICD-10-CM

## 2022-12-04 DIAGNOSIS — E1122 Type 2 diabetes mellitus with diabetic chronic kidney disease: Secondary | ICD-10-CM | POA: Diagnosis present

## 2022-12-04 DIAGNOSIS — Z833 Family history of diabetes mellitus: Secondary | ICD-10-CM

## 2022-12-04 DIAGNOSIS — Z886 Allergy status to analgesic agent status: Secondary | ICD-10-CM

## 2022-12-04 DIAGNOSIS — Z87892 Personal history of anaphylaxis: Secondary | ICD-10-CM

## 2022-12-04 DIAGNOSIS — Z79899 Other long term (current) drug therapy: Secondary | ICD-10-CM | POA: Diagnosis not present

## 2022-12-04 DIAGNOSIS — Z7982 Long term (current) use of aspirin: Secondary | ICD-10-CM | POA: Diagnosis not present

## 2022-12-04 DIAGNOSIS — M4316 Spondylolisthesis, lumbar region: Secondary | ICD-10-CM | POA: Diagnosis present

## 2022-12-04 DIAGNOSIS — M48061 Spinal stenosis, lumbar region without neurogenic claudication: Secondary | ICD-10-CM | POA: Diagnosis present

## 2022-12-04 DIAGNOSIS — E042 Nontoxic multinodular goiter: Secondary | ICD-10-CM | POA: Diagnosis present

## 2022-12-04 DIAGNOSIS — J449 Chronic obstructive pulmonary disease, unspecified: Secondary | ICD-10-CM | POA: Diagnosis present

## 2022-12-04 DIAGNOSIS — Z823 Family history of stroke: Secondary | ICD-10-CM

## 2022-12-04 HISTORY — PX: TRANSFORAMINAL LUMBAR INTERBODY FUSION (TLIF) WITH PEDICLE SCREW FIXATION 2 LEVEL: SHX6142

## 2022-12-04 LAB — CBC
HCT: 36.9 % (ref 36.0–46.0)
Hemoglobin: 11.7 g/dL — ABNORMAL LOW (ref 12.0–15.0)
MCH: 29.3 pg (ref 26.0–34.0)
MCHC: 31.7 g/dL (ref 30.0–36.0)
MCV: 92.5 fL (ref 80.0–100.0)
Platelets: 224 10*3/uL (ref 150–400)
RBC: 3.99 MIL/uL (ref 3.87–5.11)
RDW: 15.7 % — ABNORMAL HIGH (ref 11.5–15.5)
WBC: 12.4 10*3/uL — ABNORMAL HIGH (ref 4.0–10.5)
nRBC: 0 % (ref 0.0–0.2)

## 2022-12-04 LAB — GLUCOSE, CAPILLARY
Glucose-Capillary: 136 mg/dL — ABNORMAL HIGH (ref 70–99)
Glucose-Capillary: 137 mg/dL — ABNORMAL HIGH (ref 70–99)
Glucose-Capillary: 156 mg/dL — ABNORMAL HIGH (ref 70–99)
Glucose-Capillary: 289 mg/dL — ABNORMAL HIGH (ref 70–99)

## 2022-12-04 LAB — ABO/RH: ABO/RH(D): O POS

## 2022-12-04 LAB — CREATININE, SERUM
Creatinine, Ser: 1.78 mg/dL — ABNORMAL HIGH (ref 0.44–1.00)
GFR, Estimated: 29 mL/min — ABNORMAL LOW (ref >60)

## 2022-12-04 SURGERY — TRANSFORAMINAL LUMBAR INTERBODY FUSION (TLIF) WITH PEDICLE SCREW FIXATION 2 LEVEL
Anesthesia: General

## 2022-12-04 MED ORDER — ROCURONIUM BROMIDE 10 MG/ML (PF) SYRINGE
PREFILLED_SYRINGE | INTRAVENOUS | Status: AC
  Filled 2022-12-04: qty 10

## 2022-12-04 MED ORDER — CEFAZOLIN SODIUM-DEXTROSE 2-4 GM/100ML-% IV SOLN
2.0000 g | Freq: Three times a day (TID) | INTRAVENOUS | Status: AC
  Administered 2022-12-04 – 2022-12-05 (×2): 2 g via INTRAVENOUS
  Filled 2022-12-04 (×2): qty 100

## 2022-12-04 MED ORDER — LIDOCAINE 2% (20 MG/ML) 5 ML SYRINGE
INTRAMUSCULAR | Status: AC
  Filled 2022-12-04: qty 5

## 2022-12-04 MED ORDER — LACTATED RINGERS IV SOLN: INTRAVENOUS | Status: DC

## 2022-12-04 MED ORDER — PHENOL 1.4 % MT LIQD: 1.0000 | OROMUCOSAL | Status: DC | PRN

## 2022-12-04 MED ORDER — ALBUTEROL SULFATE HFA 108 (90 BASE) MCG/ACT IN AERS: 1.0000 | INHALATION_SPRAY | Freq: Four times a day (QID) | RESPIRATORY_TRACT | Status: DC | PRN

## 2022-12-04 MED ORDER — FENTANYL CITRATE (PF) 250 MCG/5ML IJ SOLN
INTRAMUSCULAR | Status: AC
  Filled 2022-12-04: qty 5

## 2022-12-04 MED ORDER — MIDAZOLAM HCL 2 MG/2ML IJ SOLN
INTRAMUSCULAR | Status: AC
  Filled 2022-12-04: qty 2

## 2022-12-04 MED ORDER — INSULIN ASPART 100 UNIT/ML IJ SOLN: 0.0000 [IU] | INTRAMUSCULAR | Status: DC | PRN

## 2022-12-04 MED ORDER — PROPOFOL 10 MG/ML IV BOLUS
INTRAVENOUS | Status: AC
  Filled 2022-12-04: qty 20

## 2022-12-04 MED ORDER — POLYETHYLENE GLYCOL 3350 17 G PO PACK
17.0000 g | PACK | Freq: Every day | ORAL | Status: DC | PRN
  Administered 2022-12-07 – 2022-12-09 (×3): 17 g via ORAL
  Filled 2022-12-04 (×3): qty 1

## 2022-12-04 MED ORDER — LIDOCAINE-EPINEPHRINE 1 %-1:100000 IJ SOLN
INTRAMUSCULAR | Status: DC | PRN
  Administered 2022-12-04: 10 mL

## 2022-12-04 MED ORDER — PHENYLEPHRINE 80 MCG/ML (10ML) SYRINGE FOR IV PUSH (FOR BLOOD PRESSURE SUPPORT)
PREFILLED_SYRINGE | INTRAVENOUS | Status: DC | PRN
  Administered 2022-12-04: 160 ug via INTRAVENOUS
  Administered 2022-12-04 (×2): 80 ug via INTRAVENOUS

## 2022-12-04 MED ORDER — CHLORHEXIDINE GLUCONATE CLOTH 2 % EX PADS: 6.0000 | MEDICATED_PAD | Freq: Once | CUTANEOUS | Status: DC

## 2022-12-04 MED ORDER — ACETAMINOPHEN 650 MG RE SUPP: 650.0000 mg | RECTAL | Status: DC | PRN

## 2022-12-04 MED ORDER — CHLORHEXIDINE GLUCONATE 0.12 % MT SOLN: 15.0000 mL | Freq: Once | OROMUCOSAL | Status: AC

## 2022-12-04 MED ORDER — FENTANYL CITRATE (PF) 100 MCG/2ML IJ SOLN
25.0000 ug | INTRAMUSCULAR | Status: DC | PRN
  Administered 2022-12-04 (×2): 50 ug via INTRAVENOUS

## 2022-12-04 MED ORDER — ALBUTEROL SULFATE (2.5 MG/3ML) 0.083% IN NEBU: 2.5000 mg | INHALATION_SOLUTION | Freq: Four times a day (QID) | RESPIRATORY_TRACT | Status: DC | PRN

## 2022-12-04 MED ORDER — BISOPROLOL FUMARATE 10 MG PO TABS
10.0000 mg | ORAL_TABLET | Freq: Every day | ORAL | Status: DC
  Administered 2022-12-05 – 2022-12-10 (×3): 10 mg via ORAL
  Filled 2022-12-04 (×6): qty 1

## 2022-12-04 MED ORDER — SIMVASTATIN 20 MG PO TABS
20.0000 mg | ORAL_TABLET | Freq: Every day | ORAL | Status: DC
  Administered 2022-12-04 – 2022-12-09 (×6): 20 mg via ORAL
  Filled 2022-12-04 (×6): qty 1

## 2022-12-04 MED ORDER — INSULIN ASPART 100 UNIT/ML IJ SOLN
0.0000 [IU] | Freq: Every day | INTRAMUSCULAR | Status: DC
  Administered 2022-12-04: 3 [IU] via SUBCUTANEOUS
  Administered 2022-12-05: 2 [IU] via SUBCUTANEOUS
  Administered 2022-12-06: 3 [IU] via SUBCUTANEOUS
  Administered 2022-12-08: 2 [IU] via SUBCUTANEOUS

## 2022-12-04 MED ORDER — DEXAMETHASONE SODIUM PHOSPHATE 10 MG/ML IJ SOLN
INTRAMUSCULAR | Status: DC | PRN
  Administered 2022-12-04: 10 mg via INTRAVENOUS

## 2022-12-04 MED ORDER — ONDANSETRON HCL 4 MG/2ML IJ SOLN: 4.0000 mg | Freq: Four times a day (QID) | INTRAMUSCULAR | Status: DC | PRN

## 2022-12-04 MED ORDER — LIDOCAINE-EPINEPHRINE 1 %-1:100000 IJ SOLN
INTRAMUSCULAR | Status: AC
  Filled 2022-12-04: qty 1

## 2022-12-04 MED ORDER — PHENYLEPHRINE HCL-NACL 20-0.9 MG/250ML-% IV SOLN
INTRAVENOUS | Status: DC | PRN
  Administered 2022-12-04: 30 ug/min via INTRAVENOUS

## 2022-12-04 MED ORDER — 0.9 % SODIUM CHLORIDE (POUR BTL) OPTIME
TOPICAL | Status: DC | PRN
  Administered 2022-12-04: 1000 mL

## 2022-12-04 MED ORDER — THROMBIN 5000 UNITS EX SOLR
CUTANEOUS | Status: AC
  Filled 2022-12-04: qty 5000

## 2022-12-04 MED ORDER — ZOLPIDEM TARTRATE 5 MG PO TABS
5.0000 mg | ORAL_TABLET | Freq: Every day | ORAL | Status: DC
  Administered 2022-12-04 – 2022-12-09 (×5): 5 mg via ORAL
  Filled 2022-12-04 (×6): qty 1

## 2022-12-04 MED ORDER — CEFAZOLIN SODIUM-DEXTROSE 2-4 GM/100ML-% IV SOLN
INTRAVENOUS | Status: AC
  Filled 2022-12-04: qty 100

## 2022-12-04 MED ORDER — CHLORHEXIDINE GLUCONATE 0.12 % MT SOLN
OROMUCOSAL | Status: AC
  Administered 2022-12-04: 15 mL via OROMUCOSAL
  Filled 2022-12-04: qty 15

## 2022-12-04 MED ORDER — FENTANYL CITRATE (PF) 100 MCG/2ML IJ SOLN
INTRAMUSCULAR | Status: AC
  Filled 2022-12-04: qty 2

## 2022-12-04 MED ORDER — OXYCODONE HCL 5 MG PO TABS
5.0000 mg | ORAL_TABLET | ORAL | Status: DC | PRN
  Administered 2022-12-09 (×2): 5 mg via ORAL
  Filled 2022-12-04 (×2): qty 1

## 2022-12-04 MED ORDER — THROMBIN 5000 UNITS EX SOLR
OROMUCOSAL | Status: DC | PRN
  Administered 2022-12-04: 5 mL via TOPICAL

## 2022-12-04 MED ORDER — LIDOCAINE 2% (20 MG/ML) 5 ML SYRINGE
INTRAMUSCULAR | Status: DC | PRN
  Administered 2022-12-04: 100 mg via INTRAVENOUS

## 2022-12-04 MED ORDER — CYCLOBENZAPRINE HCL 10 MG PO TABS
10.0000 mg | ORAL_TABLET | Freq: Three times a day (TID) | ORAL | Status: DC | PRN
  Administered 2022-12-04 – 2022-12-09 (×7): 10 mg via ORAL
  Filled 2022-12-04 (×7): qty 1

## 2022-12-04 MED ORDER — OXYCODONE HCL 5 MG PO TABS
10.0000 mg | ORAL_TABLET | ORAL | Status: DC | PRN
  Administered 2022-12-04 – 2022-12-06 (×8): 10 mg via ORAL
  Filled 2022-12-04 (×10): qty 2

## 2022-12-04 MED ORDER — MENTHOL 3 MG MT LOZG: 1.0000 | LOZENGE | OROMUCOSAL | Status: DC | PRN

## 2022-12-04 MED ORDER — SODIUM CHLORIDE 0.9% FLUSH
3.0000 mL | Freq: Two times a day (BID) | INTRAVENOUS | Status: DC
  Administered 2022-12-05 – 2022-12-10 (×10): 3 mL via INTRAVENOUS

## 2022-12-04 MED ORDER — INSULIN ASPART 100 UNIT/ML IJ SOLN
0.0000 [IU] | Freq: Three times a day (TID) | INTRAMUSCULAR | Status: DC
  Administered 2022-12-05: 3 [IU] via SUBCUTANEOUS
  Administered 2022-12-05 (×2): 5 [IU] via SUBCUTANEOUS
  Administered 2022-12-06 (×3): 3 [IU] via SUBCUTANEOUS
  Administered 2022-12-07: 2 [IU] via SUBCUTANEOUS
  Administered 2022-12-07: 3 [IU] via SUBCUTANEOUS
  Administered 2022-12-07: 5 [IU] via SUBCUTANEOUS
  Administered 2022-12-08: 2 [IU] via SUBCUTANEOUS
  Administered 2022-12-08 – 2022-12-09 (×2): 3 [IU] via SUBCUTANEOUS
  Administered 2022-12-09: 5 [IU] via SUBCUTANEOUS
  Administered 2022-12-09: 2 [IU] via SUBCUTANEOUS
  Administered 2022-12-10 (×2): 3 [IU] via SUBCUTANEOUS

## 2022-12-04 MED ORDER — SUGAMMADEX SODIUM 200 MG/2ML IV SOLN
INTRAVENOUS | Status: DC | PRN
  Administered 2022-12-04: 200 mg via INTRAVENOUS

## 2022-12-04 MED ORDER — LINAGLIPTIN 5 MG PO TABS
5.0000 mg | ORAL_TABLET | Freq: Every day | ORAL | Status: DC
  Administered 2022-12-05 – 2022-12-10 (×6): 5 mg via ORAL
  Filled 2022-12-04 (×6): qty 1

## 2022-12-04 MED ORDER — NIFEDIPINE ER OSMOTIC RELEASE 60 MG PO TB24
90.0000 mg | ORAL_TABLET | Freq: Every day | ORAL | Status: DC
  Administered 2022-12-05 – 2022-12-10 (×3): 90 mg via ORAL
  Filled 2022-12-04 (×7): qty 1

## 2022-12-04 MED ORDER — FENTANYL CITRATE (PF) 250 MCG/5ML IJ SOLN
INTRAMUSCULAR | Status: DC | PRN
  Administered 2022-12-04: 150 ug via INTRAVENOUS
  Administered 2022-12-04 (×2): 50 ug via INTRAVENOUS

## 2022-12-04 MED ORDER — PROPOFOL 10 MG/ML IV BOLUS
INTRAVENOUS | Status: DC | PRN
  Administered 2022-12-04: 50 mg via INTRAVENOUS
  Administered 2022-12-04: 150 mg via INTRAVENOUS

## 2022-12-04 MED ORDER — EMPAGLIFLOZIN 10 MG PO TABS
10.0000 mg | ORAL_TABLET | Freq: Every morning | ORAL | Status: DC
  Administered 2022-12-05 – 2022-12-10 (×6): 10 mg via ORAL
  Filled 2022-12-04 (×6): qty 1

## 2022-12-04 MED ORDER — SODIUM CHLORIDE 0.9% FLUSH: 3.0000 mL | INTRAVENOUS | Status: DC | PRN

## 2022-12-04 MED ORDER — CAPSAICIN 0.025 % EX CREA: 1.0000 | TOPICAL_CREAM | Freq: Every day | CUTANEOUS | Status: DC | PRN

## 2022-12-04 MED ORDER — CEFAZOLIN SODIUM-DEXTROSE 2-4 GM/100ML-% IV SOLN
2.0000 g | INTRAVENOUS | Status: AC
  Administered 2022-12-04: 2 g via INTRAVENOUS

## 2022-12-04 MED ORDER — PANTOPRAZOLE SODIUM 40 MG PO TBEC
40.0000 mg | DELAYED_RELEASE_TABLET | Freq: Every day | ORAL | Status: DC
  Administered 2022-12-04 – 2022-12-10 (×7): 40 mg via ORAL
  Filled 2022-12-04 (×7): qty 1

## 2022-12-04 MED ORDER — ONDANSETRON HCL 4 MG PO TABS
4.0000 mg | ORAL_TABLET | Freq: Four times a day (QID) | ORAL | Status: DC | PRN
  Administered 2022-12-05: 4 mg via ORAL
  Filled 2022-12-04: qty 1

## 2022-12-04 MED ORDER — ONDANSETRON HCL 4 MG/2ML IJ SOLN
INTRAMUSCULAR | Status: AC
  Filled 2022-12-04: qty 2

## 2022-12-04 MED ORDER — LIDOCAINE 5 % EX PTCH
1.0000 | MEDICATED_PATCH | CUTANEOUS | Status: DC
  Administered 2022-12-04 – 2022-12-09 (×6): 1 via TRANSDERMAL
  Filled 2022-12-04 (×7): qty 1

## 2022-12-04 MED ORDER — SODIUM CHLORIDE 0.9 % IV SOLN
250.0000 mL | INTRAVENOUS | Status: DC
  Administered 2022-12-04: 250 mL via INTRAVENOUS

## 2022-12-04 MED ORDER — ORAL CARE MOUTH RINSE: 15.0000 mL | Freq: Once | OROMUCOSAL | Status: AC

## 2022-12-04 MED ORDER — ROCURONIUM BROMIDE 10 MG/ML (PF) SYRINGE
PREFILLED_SYRINGE | INTRAVENOUS | Status: DC | PRN
  Administered 2022-12-04: 10 mg via INTRAVENOUS
  Administered 2022-12-04: 20 mg via INTRAVENOUS
  Administered 2022-12-04: 10 mg via INTRAVENOUS
  Administered 2022-12-04: 40 mg via INTRAVENOUS

## 2022-12-04 MED ORDER — PROMETHAZINE HCL 25 MG/ML IJ SOLN: 6.2500 mg | INTRAMUSCULAR | Status: DC | PRN

## 2022-12-04 MED ORDER — HYDROMORPHONE HCL 1 MG/ML IJ SOLN
1.0000 mg | INTRAMUSCULAR | Status: DC | PRN
  Administered 2022-12-05: 1 mg via INTRAVENOUS
  Filled 2022-12-04 (×2): qty 1

## 2022-12-04 MED ORDER — PHENYLEPHRINE 80 MCG/ML (10ML) SYRINGE FOR IV PUSH (FOR BLOOD PRESSURE SUPPORT)
PREFILLED_SYRINGE | INTRAVENOUS | Status: AC
  Filled 2022-12-04: qty 10

## 2022-12-04 MED ORDER — ACETAMINOPHEN 325 MG PO TABS
650.0000 mg | ORAL_TABLET | ORAL | Status: DC | PRN
  Administered 2022-12-04 – 2022-12-09 (×12): 650 mg via ORAL
  Filled 2022-12-04 (×12): qty 2

## 2022-12-04 MED ORDER — HEPARIN SODIUM (PORCINE) 5000 UNIT/ML IJ SOLN
5000.0000 [IU] | Freq: Three times a day (TID) | INTRAMUSCULAR | Status: DC
  Administered 2022-12-05 – 2022-12-10 (×17): 5000 [IU] via SUBCUTANEOUS
  Filled 2022-12-04 (×17): qty 1

## 2022-12-04 MED ORDER — DEXAMETHASONE SODIUM PHOSPHATE 10 MG/ML IJ SOLN
INTRAMUSCULAR | Status: AC
  Filled 2022-12-04: qty 1

## 2022-12-04 MED ORDER — DOCUSATE SODIUM 100 MG PO CAPS
100.0000 mg | ORAL_CAPSULE | Freq: Two times a day (BID) | ORAL | Status: DC
  Administered 2022-12-04 – 2022-12-10 (×12): 100 mg via ORAL
  Filled 2022-12-04 (×12): qty 1

## 2022-12-04 SURGICAL SUPPLY — 81 items
ADH SKN CLS APL DERMABOND .7 (GAUZE/BANDAGES/DRESSINGS) ×1
APL SKNCLS STERI-STRIP NONHPOA (GAUZE/BANDAGES/DRESSINGS)
BAG COUNTER SPONGE SURGICOUNT (BAG) ×1 IMPLANT
BAG SPNG CNTER NS LX DISP (BAG) ×1
BASKET BONE COLLECTION (BASKET) ×1 IMPLANT
BENZOIN TINCTURE PRP APPL 2/3 (GAUZE/BANDAGES/DRESSINGS) IMPLANT
BLADE CLIPPER SURG (BLADE) IMPLANT
BLADE SURG 11 STRL SS (BLADE) ×1 IMPLANT
BUR 14 MATCH 3 (BUR) IMPLANT
BUR MATCHSTICK NEURO 3.0 LAGG (BURR) ×1 IMPLANT
BUR MR8 14CM BALL SYMTRI 5 (BUR) IMPLANT
BUR PRECISION FLUTE 5.0 (BURR) ×1 IMPLANT
BURR 14 MATCH 3 (BUR) ×1
BURR MR8 14CM BALL SYMTRI 5 (BUR)
CAGE EXP CATALYFT SHORT 9X22.5 (Cage) IMPLANT
CAGE INTERBODY PL LG 7X26.5X24 (Cage) IMPLANT
CANISTER SUCT 3000ML PPV (MISCELLANEOUS) ×1 IMPLANT
CNTNR URN SCR LID CUP LEK RST (MISCELLANEOUS) ×1 IMPLANT
CONT SPEC 4OZ STRL OR WHT (MISCELLANEOUS) ×1
COVER BACK TABLE 60X90IN (DRAPES) ×1 IMPLANT
COVERAGE SUPPORT O-ARM STEALTH (MISCELLANEOUS) ×1
DERMABOND ADVANCED .7 DNX12 (GAUZE/BANDAGES/DRESSINGS) ×1 IMPLANT
DRAPE C-ARM 42X72 X-RAY (DRAPES) IMPLANT
DRAPE C-ARMOR (DRAPES) IMPLANT
DRAPE LAPAROTOMY 100X72X124 (DRAPES) ×1 IMPLANT
DRAPE MICROSCOPE SLANT 54X150 (MISCELLANEOUS) IMPLANT
DRAPE SHEET LG 3/4 BI-LAMINATE (DRAPES) ×4 IMPLANT
DRAPE SURG 17X23 STRL (DRAPES) ×1 IMPLANT
DURAPREP 26ML APPLICATOR (WOUND CARE) ×1 IMPLANT
ELECT REM PT RETURN 9FT ADLT (ELECTROSURGICAL) ×1
ELECTRODE REM PT RTRN 9FT ADLT (ELECTROSURGICAL) ×1 IMPLANT
FEE COVERAGE SUPPORT O-ARM (MISCELLANEOUS) ×1 IMPLANT
GAUZE 4X4 16PLY ~~LOC~~+RFID DBL (SPONGE) IMPLANT
GAUZE SPONGE 4X4 12PLY STRL (GAUZE/BANDAGES/DRESSINGS) IMPLANT
GLOVE BIO SURGEON STRL SZ7.5 (GLOVE) ×2 IMPLANT
GLOVE BIOGEL PI IND STRL 7.0 (GLOVE) ×2 IMPLANT
GLOVE BIOGEL PI IND STRL 7.5 (GLOVE) ×2 IMPLANT
GLOVE ECLIPSE 7.5 STRL STRAW (GLOVE) ×2 IMPLANT
GLOVE EXAM NITRILE LRG STRL (GLOVE) IMPLANT
GLOVE EXAM NITRILE XL STR (GLOVE) IMPLANT
GLOVE EXAM NITRILE XS STR PU (GLOVE) IMPLANT
GOWN STRL REUS W/ TWL LRG LVL3 (GOWN DISPOSABLE) ×4 IMPLANT
GOWN STRL REUS W/ TWL XL LVL3 (GOWN DISPOSABLE) IMPLANT
GOWN STRL REUS W/TWL 2XL LVL3 (GOWN DISPOSABLE) IMPLANT
GOWN STRL REUS W/TWL LRG LVL3 (GOWN DISPOSABLE) ×4
GOWN STRL REUS W/TWL XL LVL3 (GOWN DISPOSABLE)
HEMOSTAT POWDER KIT SURGIFOAM (HEMOSTASIS) ×1 IMPLANT
KIT BASIN OR (CUSTOM PROCEDURE TRAY) ×1 IMPLANT
KIT INFUSE SMALL (Orthopedic Implant) IMPLANT
KIT POSITION SURG JACKSON T1 (MISCELLANEOUS) ×1 IMPLANT
KIT TURNOVER KIT B (KITS) ×1 IMPLANT
MARKER SPHERE PSV REFLC NDI (MISCELLANEOUS) ×5 IMPLANT
MILL BONE PREP (MISCELLANEOUS) IMPLANT
NDL HYPO 18GX1.5 BLUNT FILL (NEEDLE) IMPLANT
NDL HYPO 22X1.5 SAFETY MO (MISCELLANEOUS) ×1 IMPLANT
NDL SPNL 18GX3.5 QUINCKE PK (NEEDLE) IMPLANT
NEEDLE HYPO 18GX1.5 BLUNT FILL (NEEDLE)
NEEDLE HYPO 22X1.5 SAFETY MO (MISCELLANEOUS) ×1
NEEDLE SPNL 18GX3.5 QUINCKE PK (NEEDLE)
NS IRRIG 1000ML POUR BTL (IV SOLUTION) ×1 IMPLANT
PACK LAMINECTOMY NEURO (CUSTOM PROCEDURE TRAY) ×1 IMPLANT
PAD ARMBOARD 7.5X6 YLW CONV (MISCELLANEOUS) ×3 IMPLANT
ROD PED SOLERA 5.5X60 (Rod) IMPLANT
SCREW MAS/SET HEAD FOR 5.5 ROD (Screw) IMPLANT
SCREW MAS/SET STERILE 4PK (Screw) IMPLANT
SCREW SHANK HORIZ NL 6.5X35 (Screw) IMPLANT
SCREW SHANK NL 6.5X40 (Screw) IMPLANT
SCREW SHANK NL TITAN 6.5X45 (Screw) IMPLANT
SPIKE FLUID TRANSFER (MISCELLANEOUS) ×1 IMPLANT
SPONGE SURGIFOAM ABS GEL 100 (HEMOSTASIS) IMPLANT
SPONGE T-LAP 4X18 ~~LOC~~+RFID (SPONGE) IMPLANT
STRIP CLOSURE SKIN 1/2X4 (GAUZE/BANDAGES/DRESSINGS) IMPLANT
SUT MNCRL AB 3-0 PS2 18 (SUTURE) ×1 IMPLANT
SUT VIC AB 0 CT1 18XCR BRD8 (SUTURE) ×1 IMPLANT
SUT VIC AB 0 CT1 8-18 (SUTURE) ×2
SUT VIC AB 2-0 CP2 18 (SUTURE) ×1 IMPLANT
SYR 3ML LL SCALE MARK (SYRINGE) IMPLANT
TOWEL GREEN STERILE (TOWEL DISPOSABLE) ×1 IMPLANT
TOWEL GREEN STERILE FF (TOWEL DISPOSABLE) ×1 IMPLANT
TRAY FOLEY MTR SLVR 16FR STAT (SET/KITS/TRAYS/PACK) ×1 IMPLANT
WATER STERILE IRR 1000ML POUR (IV SOLUTION) ×1 IMPLANT
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
## 2022-12-04 NOTE — Anesthesia Procedure Notes (Signed)
Procedure Name: Intubation Date/Time: 12/04/2022 11:29 AM  Performed by: Georgianne Fick D, CRNAPre-anesthesia Checklist: Patient identified, Emergency Drugs available, Suction available and Patient being monitored Patient Re-evaluated:Patient Re-evaluated prior to induction Oxygen Delivery Method: Circle System Utilized Preoxygenation: Pre-oxygenation with 100% oxygen Induction Type: IV induction Ventilation: Mask ventilation without difficulty Laryngoscope Size: Mac and 3 Grade View: Grade I Tube type: Oral Tube size: 7.5 mm Number of attempts: 1 Airway Equipment and Method: Stylet and Oral airway Placement Confirmation: ETT inserted through vocal cords under direct vision, positive ETCO2 and breath sounds checked- equal and bilateral Secured at: 22 cm Tube secured with: Tape Dental Injury: Teeth and Oropharynx as per pre-operative assessment

## 2022-12-04 NOTE — Op Note (Signed)
PATIENT: [name]  DAY OF SURGERY: 12/04/22   PRE-OPERATIVE DIAGNOSIS:  Lumbar spondylolisthesis, lumbar central canal and foraminal stenosis   POST-OPERATIVE DIAGNOSIS:  Same   PROCEDURE:  L4-5, L5-S1 open decompressive laminectomies, L4-5/L5-S1 transforaminal lumbar interbody fusion and posterolateral instrumented fusion, use of intraoperative CT and frameless stereotactic navigation   SURGEON:  Surgeon(s) and Role:    Jadene Pierini, MD    Emilee Hero PA   ANESTHESIA: ETGA   BRIEF HISTORY: This is a 77 year old woman who presented with back and lower extremity pain with some right foot weakness. The patient was found to have severe canal stenosis with foraminal stenosis at L4-5 and L5-S1 with spondylolistheses at both levels. Given the weakness and her refractory symptoms, I recommended decompression and instrumented fusion. This was discussed with the patient as well as risks, benefits, and alternatives and wished to proceed with surgery.   OPERATIVE DETAIL: The patient was taken to the operating room and anesthesia was induced by the anesthesia team. They were placed on the OR table in the prone position with padding of all pressure points. A formal time out was performed with two patient identifiers and confirmed the operative site. The operative site was marked, hair was clipped with surgical clippers, the area was then prepped and draped in a sterile fashion. A midline incision was placed to expose from L4 to S1. Subperiosteal dissection was performed bilaterally.   A spinous process clamp was applied and secured, followed by attachment of a reference array. The field was draped and the O-arm was brought into the field. An intra-op CT was performed, sent to the Stealth navigation station, registered to the patient's anatomy, and confirmed with landmarks with acceptable fit. Stereotactic spinal navigation was utilized throughout the procedure for planning and placement of  pedicle screw trajectories.  Instrumentation was then performed. Frameless stereotaxy was used to guide placement of bilateral pedicle screws ([employer]) at L4, L5, and S1. These were placed with navigated instruments by localizing the pedicle, drilling a pilot hole, cannulating the pedicle with an awl-tap, palpating for pedicle wall breaches, and then placing the screw.   Decompression was performed, which consisted of decompressive laminectomies at L4-5 and L5-S1. These were performed with a combination of high speed drill and rongeurs until the thecal sac was well decompressed. Foraminotomies were performed bilaterally until the exiting roots were well decompressed. A complete facetectomy was performed on the right at L4-5 and L5-S1 for the TLIF approach. The traversing and exiting roots were identified and protected. A discectomy was performed, endplates were prepped, and an interbody was sized, then packed with graft. rBMP was placed into the disc space followed by morselized autograft then the interbody was placed and expanded, then released.    The pedicle screws were connected with rods bilaterally and final tightened according to manufacturer torque specifications. The bone was thoroughly decorticated over the fusion surfaces posterolaterally and the previously resected bone fragments were morselized and used as autograft with the remaining rBMP used along the remaining facets and transverse processes.   Alli Consentino PA was scrubbed and assisted with the entire procedure which included exposure, instrumentation, decompression, and closure.  All instrument and sponge counts were correct, the incision was then closed in layers. The patient was then returned to anesthesia for emergence. No apparent complications at the completion of the procedure.   EBL:    DRAINS: none   SPECIMENS: none   Jadene Pierini, MD

## 2022-12-04 NOTE — Transfer of Care (Signed)
Immediate Anesthesia Transfer of Care Note  Patient: [name]  Procedure(s) Performed: Open Lumbar Four-Five and Lumbar Five-Sacral One laminectomy and transforaminal lumbar interbody fusion with posterolateral instrumented fusion Application of O-Arm  Patient Location: PACU  Anesthesia Type:General  Level of Consciousness: awake, alert , and oriented  Airway & Oxygen Therapy: Patient Spontanous Breathing and Patient connected to nasal cannula oxygen  Post-op Assessment: Report given to RN and Post -op Vital signs reviewed and stable  Post vital signs: Reviewed and stable  Last Vitals:  Vitals Value Taken Time  BP 145/64 12/04/22 1525  Temp    Pulse 67 12/04/22 1527  Resp 15 12/04/22 1527  SpO2 100 % 12/04/22 1527  Vitals shown include unfiled device data.  Last Pain:  Vitals:   12/04/22 0903  TempSrc:   PainSc: 6          Complications: No notable events documented.

## 2022-12-05 ENCOUNTER — Encounter (HOSPITAL_COMMUNITY): Payer: Self-pay | Admitting: Neurological Surgery

## 2022-12-05 LAB — GLUCOSE, CAPILLARY
Glucose-Capillary: 215 mg/dL — ABNORMAL HIGH (ref 70–99)
Glucose-Capillary: 207 mg/dL — ABNORMAL HIGH (ref 70–99)
Glucose-Capillary: 193 mg/dL — ABNORMAL HIGH (ref 70–99)
Glucose-Capillary: 204 mg/dL — ABNORMAL HIGH (ref 70–99)

## 2022-12-05 NOTE — Progress Notes (Addendum)
Physical Therapy Treatment  Patient Details Name: [name] MRN: [id number] DOB: [birth year] Today's Date: 12/05/2022   History of Present Illness Pt is a 77 y/o female who presents s/p L4-S1 TLIF on 12/04/2022. PMH significant for CKD, COPD, DVT, DM, HTN, multinodular goiter, R knee surgery, neck surgery.    PT Comments  Pt admitted with above diagnosis. At the time of PT eval, pt was able to demonstrate transfers and ambulation with gross min-mod assist and RW for support. Pain was a limiting factor during session. Pt was educated on precautions, brace application/wearing schedule, appropriate activity progression, and positioning recommendations. Pt currently with functional limitations due to the deficits listed below (see PT Problem List). Pt will benefit from skilled PT to increase their independence and safety with mobility to allow discharge to the venue listed below.      If plan is discharge home, recommend the following: A little help with walking and/or transfers;A little help with bathing/dressing/bathroom;Assistance with cooking/housework;Assist for transportation;Help with stairs or ramp for entrance   Can travel by private vehicle     Yes  Equipment Recommendations  Other (comment) (TBD by next venue of care)    Recommendations for Other Services       Precautions / Restrictions Precautions Precautions: Fall;Back Precaution Booklet Issued: Yes (comment) Precaution Comments: Reviewed handout and pt was cued for precautions during functional mobility. Required Braces or Orthoses: Spinal Brace Spinal Brace: Lumbar corset;Applied in sitting position Restrictions Weight Bearing Restrictions: No     Mobility  Bed Mobility Overal bed mobility: Needs Assistance Bed Mobility: Sit to Sidelying         Sit to sidelying: Mod assist General bed mobility comments: Assist for LE elevation up into the bed at end of session. VC's for log roll technique and heavy use of  rails.    Transfers Overall transfer level: Needs assistance Equipment used: Rolling walker (2 wheels) Transfers: Sit to/from Stand Sit to Stand: Min assist           General transfer comment: Increased time due to pain. VC's for hand placement on seated surface for safety.    Ambulation/Gait Ambulation/Gait assistance: Min assist Gait Distance (Feet): 60 Feet Assistive device: Rolling walker (2 wheels) Gait Pattern/deviations: Step-through pattern, Decreased stride length, Trunk flexed Gait velocity: Decreased Gait velocity interpretation: 1.31 - 2.62 ft/sec, indicative of limited community ambulator   General Gait Details: Slow, guarded, and effortful due to pain. Pt took a standing rest break 2 fatigue and pain however was able to ambulate back to the room without any LOB. Occasional min assist provided for balance support and walker management.   Stairs             Wheelchair Mobility     Tilt Bed    Modified Rankin (Stroke Patients Only)       Balance Overall balance assessment: Needs assistance Sitting-balance support: Feet supported, No upper extremity supported Sitting balance-Leahy Scale: Fair     Standing balance support: During functional activity, No upper extremity supported, Reliant on assistive device for balance Standing balance-Leahy Scale: Poor                              Cognition Arousal: Alert Behavior During Therapy: WFL for tasks assessed/performed Overall Cognitive Status: Within Functional Limits for tasks assessed  Exercises      General Comments General comments (skin integrity, edema, etc.): family at side and supportive      Pertinent Vitals/Pain Pain Assessment Pain Assessment: Faces Faces Pain Scale: Hurts whole lot Pain Location: Back/incision site Pain Descriptors / Indicators: Operative site guarding, Sore, Moaning Pain Intervention(s):  Limited activity within patient's tolerance, Monitored during session, Repositioned    Home Living Family/patient expects to be discharged to:: Skilled nursing facility                        Prior Function            PT Goals (current goals can now be found in the care plan section) Acute Rehab PT Goals Patient Stated Goal: [facility] at d/c for rehab PT Goal Formulation: With patient/family Time For Goal Achievement: 12/19/22 Potential to Achieve Goals: Good    Frequency    Min 1X/week      PT Plan      Co-evaluation              AM-PAC PT "6 Clicks" Mobility   Outcome Measure  Help needed turning from your back to your side while in a flat bed without using bedrails?: A Little Help needed moving from lying on your back to sitting on the side of a flat bed without using bedrails?: A Lot Help needed moving to and from a bed to a chair (including a wheelchair)?: A Little Help needed standing up from a chair using your arms (e.g., wheelchair or bedside chair)?: A Little Help needed to walk in hospital room?: A Little Help needed climbing 3-5 steps with a railing? : A Little 6 Click Score: 17    End of Session Equipment Utilized During Treatment: Gait belt;Back brace Activity Tolerance: Patient tolerated treatment well Patient left: in bed;with call bell/phone within reach;with family/visitor present Nurse Communication: Mobility status PT Visit Diagnosis: Unsteadiness on feet (R26.81);Pain Pain - part of body:  (back)     Time: 7564-3329 PT Time Calculation (min) (ACUTE ONLY): 25 min  Charges:    $Gait Training: 8-22 mins PT General Charges $$ ACUTE PT VISIT: 1 Visit                     Conni Slipper, PT, DPT Acute Rehabilitation Services Secure Chat Preferred Office: [phone]    [name] 12/05/2022, 2:00 PM

## 2022-12-05 NOTE — Anesthesia Postprocedure Evaluation (Signed)
Anesthesia Post Note  Patient: [name]  Procedure(s) Performed: Open Lumbar Four-Five and Lumbar Five-Sacral One laminectomy and transforaminal lumbar interbody fusion with posterolateral instrumented fusion Application of O-Arm     Patient location during evaluation: PACU Anesthesia Type: General Level of consciousness: awake Pain management: pain level controlled Vital Signs Assessment: post-procedure vital signs reviewed and stable Respiratory status: spontaneous breathing, nonlabored ventilation and respiratory function stable Cardiovascular status: blood pressure returned to baseline and stable Postop Assessment: no apparent nausea or vomiting Anesthetic complications: no   No notable events documented.  Last Vitals:  Vitals:   12/04/22 2335 12/05/22 0500  BP: 123/62 (!) 117/57  Pulse: 79 84  Resp: 18 18  Temp: 36.8 C 37.7 C  SpO2: 97% 98%    Last Pain:  Vitals:   12/05/22 0531  TempSrc:   PainSc: 10-Worst pain ever                 Kallista Pae P Ledon Weihe

## 2022-12-05 NOTE — Progress Notes (Signed)
Neurosurgery Service Progress Note  Subjective: No acute events overnight. Reports some improvement in pre-op LE symptoms. Back pain as expected for procedure.    Objective: Vitals:   12/04/22 1703 12/04/22 2335 12/05/22 0500 12/05/22 0748  BP: (!) 153/74 123/62 (!) 117/57 (!) 105/52  Pulse: 69 79 84 88  Resp: 20 18 18 16   Temp: 97.6 F (36.4 C) 98.2 F (36.8 C) 99.8 F (37.7 C) 98.9 F (37.2 C)  TempSrc:  Oral Oral Oral  SpO2: 100% 97% 98% 97%  Weight:      Height:        Physical Exam: Strength 4/5 in BLE, strength 5/5 in the BUE, and SILTx4, incision c/d/I   Assessment & Plan: 77 y.o.  female s/p L4-S1 decompressive laminctomies, TLIF, and PSF, recovering well.  -PT/OT recs, suspect she will likely need SNF for post acute rehab  -SCDs/TEDs  -d/c foley   Shelley Hero, PA-C 12/05/22 9:15 AM

## 2022-12-05 NOTE — Progress Notes (Signed)
Patient transferred from [facility], Vs stable, alert and oriented, call bell within reach, family members in bedside, will continue to monitor

## 2022-12-05 NOTE — Evaluation (Signed)
Occupational Therapy Evaluation Patient Details Name: [name] MRN: [id number] DOB: March 26, 1946 Today's Date: 12/05/2022   History of Present Illness Pt is a 77 y/o female who presents s/p L4-S1 TLIF on 12/04/2022. PMH significant for CKD, COPD, DVT, DM, HTN, multinodular goiter, R knee surgery, neck surgery.   Clinical Impression   Patient admitted for above and presents with problem list below.  PTA pt was independent with ADLs, using cane for mobility and needing assist for IADls. Patient was educated on brace mgmt and wear schedule, back precautions, ADL compensatory techniques, AE/DME, mobility progression, safety and recommendations.  Today, pt demonstrated ability to complete bed mobility with min assist, transfers using RW with min assist, functional mobility using RW with min assist, and ADLs with setup to total assist.  Limited by pain, decreased activity tolerance and impaired balance.  Pt has limited support at dc.  Based on performance today, recommend continued OT services acutely and after dc at <3hrs/day inpatient setting to optimize independence and decreased burden of care.  OT will follow.        If plan is discharge home, recommend the following: A little help with walking and/or transfers;A lot of help with bathing/dressing/bathroom;Assistance with cooking/housework;Assist for transportation;Help with stairs or ramp for entrance    Functional Status Assessment  Patient has had a recent decline in their functional status and demonstrates the ability to make significant improvements in function in a reasonable and predictable amount of time.  Equipment Recommendations  Other (comment) (defer)    Recommendations for Other Services       Precautions / Restrictions Precautions Precautions: Fall;Back Precaution Booklet Issued: Yes (comment) Precaution Comments: Reviewed handout and pt was cued for precautions during functional mobility. Required Braces or Orthoses:  Spinal Brace Spinal Brace: Lumbar corset;Applied in sitting position Restrictions Weight Bearing Restrictions: No      Mobility Bed Mobility Overal bed mobility: Needs Assistance Bed Mobility: Rolling, Sidelying to Sit Rolling: Min assist Sidelying to sit: Min assist       General bed mobility comments: for technique and heavy use of rails, HOB elevated. Trunk support to ascend    Transfers Overall transfer level: Needs assistance Equipment used: Rolling walker (2 wheels) Transfers: Sit to/from Stand Sit to Stand: Min assist           General transfer comment: Increased time due to pain. VC's for hand placement on seated surface for safety.      Balance Overall balance assessment: Needs assistance Sitting-balance support: Feet supported, No upper extremity supported Sitting balance-Leahy Scale: Fair     Standing balance support: During functional activity, No upper extremity supported, Reliant on assistive device for balance Standing balance-Leahy Scale: Poor Standing balance comment: relies on BUE and external support                           ADL either performed or assessed with clinical judgement   ADL Overall ADL's : Needs assistance/impaired     Grooming: Sitting;Contact guard assist           Upper Body Dressing : Minimal assistance;Sitting   Lower Body Dressing: Total assistance;Sit to/from stand   Toilet Transfer: Minimal assistance;Ambulation;Rolling walker (2 wheels) Toilet Transfer Details (indicate cue type and reason): simulated in room         Functional mobility during ADLs: Minimal assistance;Rolling walker (2 wheels);Cueing for safety General ADL Comments: pt limited by pain     Vision  Vision Assessment?: No apparent visual deficits     Perception         Praxis         Pertinent Vitals/Pain Pain Assessment Pain Assessment: Faces Faces Pain Scale: Hurts whole lot Pain Location: Back/incision site Pain  Descriptors / Indicators: Operative site guarding, Sore, Moaning Pain Intervention(s): Limited activity within patient's tolerance, Monitored during session, Repositioned     Extremity/Trunk Assessment Upper Extremity Assessment Upper Extremity Assessment: Generalized weakness   Lower Extremity Assessment Lower Extremity Assessment: Defer to PT evaluation   Cervical / Trunk Assessment Cervical / Trunk Assessment: Back Surgery   Communication Communication Communication: No apparent difficulties Cueing Techniques: Verbal cues   Cognition Arousal: Alert Behavior During Therapy: WFL for tasks assessed/performed Overall Cognitive Status: Within Functional Limits for tasks assessed                                       General Comments  family at side and supportive    Exercises     Shoulder Instructions      Home Living Family/patient expects to be discharged to:: Skilled nursing facility                                        Prior Functioning/Environment Prior Level of Function : Needs assist             Mobility Comments: Utilizing [facility] for support ADLs Comments: Able to care for herself but with increased time, needing assist for IADLs        OT Problem List: Decreased strength;Decreased activity tolerance;Impaired balance (sitting and/or standing);Decreased safety awareness;Decreased knowledge of use of DME or AE;Decreased knowledge of precautions;Pain      OT Treatment/Interventions: Self-care/ADL training;Therapeutic exercise;DME and/or AE instruction;Therapeutic activities;Patient/family education;Balance training    OT Goals(Current goals can be found in the care plan section) Acute Rehab OT Goals Patient Stated Goal: less pain OT Goal Formulation: With patient Time For Goal Achievement: 12/19/22 Potential to Achieve Goals: Fair  OT Frequency: Min 1X/week    Co-evaluation              AM-PAC OT "6 Clicks" Daily  Activity     Outcome Measure Help from another person eating meals?: None Help from another person taking care of personal grooming?: A Little Help from another person toileting, which includes using toliet, bedpan, or urinal?: A Lot Help from another person bathing (including washing, rinsing, drying)?: A Lot Help from another person to put on and taking off regular upper body clothing?: A Little Help from another person to put on and taking off regular lower body clothing?: Total 6 Click Score: 15   End of Session Equipment Utilized During Treatment: Gait belt;Rolling walker (2 wheels);Back brace Nurse Communication: Mobility status  Activity Tolerance: Patient tolerated treatment well Patient left: Other (comment) (with PT)  OT Visit Diagnosis: Other abnormalities of gait and mobility (R26.89);Muscle weakness (generalized) (M62.81);Pain Pain - part of body:  (back)                Time: 9629-5284 OT Time Calculation (min): 33 min Charges:  OT General Charges $OT Visit: 1 Visit OT Evaluation $OT Eval Low Complexity: 1 Low OT Treatments $Self Care/Home Management : 8-22 mins  Barry Brunner, OT Acute Rehabilitation Services Office [phone]   [place]  S Viviano Bir 12/05/2022, 12:10 PM

## 2022-12-05 NOTE — NC FL2 (Addendum)
[place] MEDICAID FL2 LEVEL OF CARE FORM     IDENTIFICATION  Patient Name: [name] Birthdate: 1945/12/22 Sex: female Admission Date (Current Location): 12/04/2022  [facility] and [place] Number:  [employer] and Address:  The [place], [address], [place], [place] [address]      Provider Number: [id number]  Attending Physician Name and Address:  Jadene Pierini, MD  Relative Name and Phone Number:       Current Level of Care: Hospital Recommended Level of Care: Skilled Nursing Facility Prior Approval Number:    Date Approved/Denied:   PASRR Number: [id number]  Discharge Plan: SNF    Current Diagnoses: Patient Active Problem List   Diagnosis Date Noted   Spondylolisthesis of lumbar region 12/04/2022   Edema of lower extremity 01/02/2022   Hyperparathyroidism due to renal insufficiency (HCC) 05/03/2020   Chronic kidney disease, stage 4 (severe) (HCC) 01/26/2019   Hypercalcemia 01/26/2019   Proteinuria 01/26/2019   Benign hypertensive kidney disease with chronic kidney disease 01/26/2019   Osteoarthritis of left knee 07/26/2017   Osteoarthritis of right knee 06/20/2017   Multinodular goiter    Bilateral carotid bruits 11/08/2015   Obesity (BMI 30.0-34.9) 11/08/2015   Hyperlipidemia 11/08/2015   Gastritis and gastroduodenitis    Nausea with vomiting    Exertional chest pain 08/13/2014   Encounter for annual physical exam 05/12/2014   Hypokalemia 07/03/2012   Abdominal pain 07/01/2012   Dehydration 07/01/2012   Right kidney mass 07/01/2012   Tobacco abuse 07/01/2012   DM type 2 (diabetes mellitus, type 2) (HCC) 07/01/2012   Chronic back pain 07/01/2012   Abnormal gall bladder diagnostic imaging 07/01/2012   Homocysteinemia 11/29/2010   Deep vein thrombosis (DVT) (HCC) 11/13/2010   ARTHRITIS, LEFT KNEE 04/21/2009   Essential hypertension 04/21/2009    Orientation RESPIRATION BLADDER Height & Weight     Self,  Time, Situation, Place  Normal Continent Weight: 175 lb (79.4 kg) Height:  5\' 10"  (177.8 cm)  BEHAVIORAL SYMPTOMS/MOOD NEUROLOGICAL BOWEL NUTRITION STATUS      Continent Diet (Carb Modified)  AMBULATORY STATUS COMMUNICATION OF NEEDS Skin   Limited Assist   Surgical wounds (Closed Back Honeycomb Dressing)                       Personal Care Assistance Level of Assistance  Bathing, Feeding, Dressing Bathing Assistance: Maximum assistance Feeding assistance: Independent Dressing Assistance: Maximum assistance     Functional Limitations Info             SPECIAL CARE FACTORS FREQUENCY  PT (By licensed PT), OT (By licensed OT)     PT Frequency: 5x/W OT Frequency: 5x/W            Contractures Contractures Info: Not present    Additional Factors Info  Code Status, Allergies Code Status Info: Full Allergies Info: Ace Inhibitors, Celecoxib, Hydrocodone-acetaminophen, Zofran (Ondansetron Hcl)           Current Medications (12/05/2022):  This is the current hospital active medication list Current Facility-Administered Medications  Medication Dose Route Frequency Provider Last Rate Last Admin   0.9 %  sodium chloride infusion  250 mL Intravenous Continuous Jadene Pierini, MD 1 mL/hr at 12/05/22 1520 Infusion Verify at 12/05/22 1520   acetaminophen (TYLENOL) tablet 650 mg  650 mg Oral Q4H PRN Jadene Pierini, MD   650 mg at 12/05/22 0955   Or   acetaminophen (TYLENOL) suppository 650 mg  650 mg Rectal Q4H PRN Jadene Pierini, MD       albuterol (PROVENTIL) (2.5 MG/3ML) 0.083% nebulizer solution 2.5 mg  2.5 mg Nebulization Q6H PRN Jadene Pierini, MD       bisoprolol (ZEBETA) tablet 10 mg  10 mg Oral Daily Jadene Pierini, MD   10 mg at 12/05/22 0847   capsaicin (ZOSTRIX) 0.025 % cream 1 Application  1 Application Topical Daily PRN Jadene Pierini, MD       cyclobenzaprine (FLEXERIL) tablet 10 mg  10 mg Oral TID PRN Jadene Pierini, MD   10 mg at  12/05/22 0126   docusate sodium (COLACE) capsule 100 mg  100 mg Oral BID Jadene Pierini, MD   100 mg at 12/05/22 0847   empagliflozin (JARDIANCE) tablet 10 mg  10 mg Oral q morning Jadene Pierini, MD   10 mg at 12/05/22 0847   heparin injection 5,000 Units  5,000 Units Subcutaneous Q8H Jadene Pierini, MD   5,000 Units at 12/05/22 1326   HYDROmorphone (DILAUDID) injection 1 mg  1 mg Intravenous Q3H PRN Jadene Pierini, MD       insulin aspart (novoLOG) injection 0-15 Units  0-15 Units Subcutaneous TID WC Jadene Pierini, MD   5 Units at 12/05/22 1326   insulin aspart (novoLOG) injection 0-5 Units  0-5 Units Subcutaneous QHS Jadene Pierini, MD   3 Units at 12/04/22 2152   lidocaine (LIDODERM) 5 % 1 patch  1 patch Transdermal Q24H Jadene Pierini, MD   1 patch at 12/04/22 1949   linagliptin (TRADJENTA) tablet 5 mg  5 mg Oral Daily Jadene Pierini, MD   5 mg at 12/05/22 1610   menthol-cetylpyridinium (CEPACOL) lozenge 3 mg  1 lozenge Oral PRN Jadene Pierini, MD       Or   phenol (CHLORASEPTIC) mouth spray 1 spray  1 spray Mouth/Throat PRN Jadene Pierini, MD       NIFEdipine (PROCARDIA-XL/NIFEDICAL-XL) 24 hr tablet 90 mg  90 mg Oral Daily Jadene Pierini, MD   90 mg at 12/05/22 0846   ondansetron (ZOFRAN) tablet 4 mg  4 mg Oral Q6H PRN Jadene Pierini, MD   4 mg at 12/05/22 0847   Or   ondansetron (ZOFRAN) injection 4 mg  4 mg Intravenous Q6H PRN Jadene Pierini, MD       oxyCODONE (Oxy IR/ROXICODONE) immediate release tablet 10 mg  10 mg Oral Q4H PRN Jadene Pierini, MD   10 mg at 12/05/22 1356   oxyCODONE (Oxy IR/ROXICODONE) immediate release tablet 5 mg  5 mg Oral Q4H PRN Jadene Pierini, MD       pantoprazole (PROTONIX) EC tablet 40 mg  40 mg Oral Daily Jadene Pierini, MD   40 mg at 12/05/22 0748   polyethylene glycol (MIRALAX / GLYCOLAX) packet 17 g  17 g Oral Daily PRN Jadene Pierini, MD       simvastatin (ZOCOR) tablet  20 mg  20 mg Oral q1800 Jadene Pierini, MD   20 mg at 12/04/22 1810   sodium chloride flush (NS) 0.9 % injection 3 mL  3 mL Intravenous Q12H Jadene Pierini, MD   3 mL at 12/05/22 0848   sodium chloride flush (NS) 0.9 % injection 3 mL  3 mL Intravenous PRN Jadene Pierini, MD       zolpidem (AMBIEN) tablet 5 mg  5 mg Oral QHS [place],  Clovis Pu, MD   5 mg at 12/04/22 2149     Discharge Medications: Please see discharge summary for a list of discharge medications.  Relevant Imaging Results:  Relevant Lab Results:   Additional Information SSN: [id number]  Antion Felipa [place], Student-Social Work

## 2022-12-05 NOTE — TOC Initial Note (Signed)
Transition of Care [facility]) - Initial/Assessment Note    Patient Details  Name: [name] MRN: [id number] Date of Birth: 1945/07/30  Transition of Care [facility]) CM/SW Contact:    Baldemar Lenis, LCSW Phone Number: 12/05/2022, 4:04 PM  Clinical Narrative:     CSW met with patient and spouse at bedside to discuss recommendation for SNF. Patient and spouse in agreement, preference for [facility]. Patient expressed concern about going home with the level of assistance she needs as her husband has had multiple surgeries himself and cannot provide physical assist. CSW sent referral, contacted [facility] to ask them to review. CSW to follow.              Expected Discharge Plan: Skilled Nursing Facility Barriers to Discharge: Continued Medical Work up, English as a second language teacher   Patient Goals and CMS Choice Patient states their goals for this hospitalization and ongoing recovery are:: be able to get back home, be in less pain CMS Medicare.gov Compare Post Acute Care list provided to:: Patient Choice offered to / list presented to : Patient [place] ownership interest in [facility].provided to:: Patient    Expected Discharge Plan and Services     Post Acute Care Choice: Skilled Nursing Facility Living arrangements for the past 2 months: Single Family Home                                      Prior Living Arrangements/Services Living arrangements for the past 2 months: Single Family Home Lives with:: Spouse Patient language and need for interpreter reviewed:: No Do you feel safe going back to the place where you live?: Yes      Need for Family Participation in Patient Care: No (Comment) Care giver support system in place?: No (comment)   Criminal Activity/Legal Involvement Pertinent to Current Situation/Hospitalization: No - Comment as needed  Activities of Daily Living      Permission Sought/Granted Permission sought to share information with : Facility  Medical sales representative, Family Supports Permission granted to share information with : Yes, Verbal Permission Granted  Share Information with NAME: Onalee Hua  Permission granted to share info w AGENCY: SNF  Permission granted to share info w Relationship: Spouse     Emotional Assessment Appearance:: Appears stated age Attitude/Demeanor/Rapport: Engaged Affect (typically observed): Appropriate Orientation: : Oriented to Self, Oriented to Place, Oriented to  Time, Oriented to Situation Alcohol / Substance Use: Not Applicable Psych Involvement: No (comment)  Admission diagnosis:  Spondylolisthesis of lumbar region [M43.16] Patient Active Problem List   Diagnosis Date Noted   Spondylolisthesis of lumbar region 12/04/2022   Edema of lower extremity 01/02/2022   Hyperparathyroidism due to renal insufficiency (HCC) 05/03/2020   Chronic kidney disease, stage 4 (severe) (HCC) 01/26/2019   Hypercalcemia 01/26/2019   Proteinuria 01/26/2019   Benign hypertensive kidney disease with chronic kidney disease 01/26/2019   Osteoarthritis of left knee 07/26/2017   Osteoarthritis of right knee 06/20/2017   Multinodular goiter    Bilateral carotid bruits 11/08/2015   Obesity (BMI 30.0-34.9) 11/08/2015   Hyperlipidemia 11/08/2015   Gastritis and gastroduodenitis    Nausea with vomiting    Exertional chest pain 08/13/2014   Encounter for annual physical exam 05/12/2014   Hypokalemia 07/03/2012   Abdominal pain 07/01/2012   Dehydration 07/01/2012   Right kidney mass 07/01/2012   Tobacco abuse 07/01/2012   DM type 2 (diabetes mellitus, type 2) (  HCC) 07/01/2012   Chronic back pain 07/01/2012   Abnormal gall bladder diagnostic imaging 07/01/2012   Homocysteinemia 11/29/2010   Deep vein thrombosis (DVT) (HCC) 11/13/2010   ARTHRITIS, LEFT KNEE 04/21/2009   Essential hypertension 04/21/2009   PCP:  Assunta Found, MD Pharmacy:   [facility] [place], [place] - [address] [place] [address] Phone: [phone] Fax: [phone]     Social Determinants of Health (SDOH) Social History: SDOH Screenings   Food Insecurity: No Food Insecurity (10/21/2019)  Housing: Low Risk  (10/21/2019)  Transportation Needs: No Transportation Needs (10/21/2019)  Alcohol Screen: Low Risk  (10/21/2019)  Depression (PHQ2-9): Low Risk  (10/21/2019)  Financial Resource Strain: Low Risk  (10/21/2019)  Physical Activity: Insufficiently Active (10/21/2019)  Social Connections: Socially Integrated (10/21/2019)  Stress: No Stress Concern Present (10/21/2019)  Tobacco Use: High Risk (12/04/2022)   SDOH Interventions:     Readmission Risk Interventions     No data to display

## 2022-12-05 NOTE — Plan of Care (Signed)
  Problem: Education: Goal: Knowledge of General Education information will improve Description: Including pain rating scale, medication(s)/side effects and non-pharmacologic comfort measures Outcome: Progressing   Problem: Clinical Measurements: Goal: Respiratory complications will improve Outcome: Progressing Goal: Cardiovascular complication will be avoided Outcome: Progressing   Problem: Activity: Goal: Risk for activity intolerance will decrease Outcome: Progressing   Problem: Nutrition: Goal: Adequate nutrition will be maintained Outcome: Progressing   Problem: Coping: Goal: Level of anxiety will decrease Outcome: Progressing   Problem: Elimination: Goal: Will not experience complications related to urinary retention Outcome: Progressing   Problem: Skin Integrity: Goal: Risk for impaired skin integrity will decrease Outcome: Progressing

## 2022-12-06 LAB — GLUCOSE, CAPILLARY
Glucose-Capillary: 202 mg/dL — ABNORMAL HIGH (ref 70–99)
Glucose-Capillary: 192 mg/dL — ABNORMAL HIGH (ref 70–99)
Glucose-Capillary: 181 mg/dL — ABNORMAL HIGH (ref 70–99)
Glucose-Capillary: 237 mg/dL — ABNORMAL HIGH (ref 70–99)

## 2022-12-06 NOTE — Progress Notes (Signed)
Physical Therapy Treatment  Patient Details Name: [name] MRN: [id number] DOB: Oct 11, 1945 Today's Date: 12/06/2022   History of Present Illness Pt is a 77 y/o female who presents s/p L4-S1 TLIF on 12/04/2022. PMH significant for CKD, COPD, DVT, DM, HTN, multinodular goiter, R knee surgery, neck surgery.    PT Comments  Pt progressing slowly with post-op mobility, mainly limited by pain throughout session. She was able to demonstrate transfers and ambulation with gross min-mod assist and RW for support. Reinforced education on precautions, brace application/wearing schedule, appropriate activity progression, and positioning recommendations. Pt anticipates d/c to [facility], pending insurance approval. Will continue to follow.      If plan is discharge home, recommend the following: A little help with walking and/or transfers;A little help with bathing/dressing/bathroom;Assistance with cooking/housework;Assist for transportation;Help with stairs or ramp for entrance   Can travel by private vehicle     Yes  Equipment Recommendations  Other (comment) (TBD by next venue of care)    Recommendations for Other Services       Precautions / Restrictions Precautions Precautions: Fall;Back Precaution Booklet Issued: Yes (comment) Precaution Comments: Reviewed handout and pt was cued for precautions during functional mobility. Required Braces or Orthoses: Spinal Brace Spinal Brace: Lumbar corset;Applied in sitting position Restrictions Weight Bearing Restrictions: No     Mobility  Bed Mobility Overal bed mobility: Needs Assistance Bed Mobility: Rolling, Sit to Sidelying Rolling: Min assist       Sit to sidelying: Mod assist General bed mobility comments: Assist for technique. Heavy use of rails required. Heavy assist for LE elevation back up into bed at end of session.    Transfers Overall transfer level: Needs assistance Equipment used: Rolling walker (2 wheels) Transfers: Sit  to/from Stand Sit to Stand: Min assist           General transfer comment: Increased time due to pain. VC's for hand placement on seated surface for safety.    Ambulation/Gait Ambulation/Gait assistance: Min assist Gait Distance (Feet): 40 Feet (40', seated rest, 40') Assistive device: Rolling walker (2 wheels) Gait Pattern/deviations: Step-through pattern, Decreased stride length, Trunk flexed Gait velocity: Decreased Gait velocity interpretation: 1.31 - 2.62 ft/sec, indicative of limited community ambulator   General Gait Details: Slow, guarded, and effortful due to pain. Pt took a seated rest break 2 fatigue and pain however was able to ambulate back to the room without any overt LOB. Occasional min assist provided for light balance support and walker management.   Stairs             Wheelchair Mobility     Tilt Bed    Modified Rankin (Stroke Patients Only)       Balance Overall balance assessment: Needs assistance Sitting-balance support: Feet supported, No upper extremity supported Sitting balance-Leahy Scale: Fair     Standing balance support: During functional activity, No upper extremity supported, Reliant on assistive device for balance Standing balance-Leahy Scale: Poor Standing balance comment: relies on BUE and external support                            Cognition Arousal: Alert Behavior During Therapy: WFL for tasks assessed/performed Overall Cognitive Status: Within Functional Limits for tasks assessed  Exercises      General Comments        Pertinent Vitals/Pain Pain Assessment Pain Assessment: Faces Faces Pain Scale: Hurts whole lot Pain Location: Back/incision site Pain Descriptors / Indicators: Operative site guarding, Sore, Moaning Pain Intervention(s): Monitored during session, Limited activity within patient's tolerance, Repositioned    Home Living                           Prior Function            PT Goals (current goals can now be found in the care plan section) Acute Rehab PT Goals Patient Stated Goal: [facility] at d/c for rehab PT Goal Formulation: With patient/family Time For Goal Achievement: 12/19/22 Potential to Achieve Goals: Good Progress towards PT goals: Progressing toward goals    Frequency    Min 1X/week      PT Plan      Co-evaluation              AM-PAC PT "6 Clicks" Mobility   Outcome Measure  Help needed turning from your back to your side while in a flat bed without using bedrails?: A Little Help needed moving from lying on your back to sitting on the side of a flat bed without using bedrails?: A Lot Help needed moving to and from a bed to a chair (including a wheelchair)?: A Little Help needed standing up from a chair using your arms (e.g., wheelchair or bedside chair)?: A Little Help needed to walk in hospital room?: A Little Help needed climbing 3-5 steps with a railing? : A Little 6 Click Score: 17    End of Session Equipment Utilized During Treatment: Gait belt;Back brace Activity Tolerance: Patient tolerated treatment well Patient left: in bed;with call bell/phone within reach;with family/visitor present Nurse Communication: Mobility status PT Visit Diagnosis: Unsteadiness on feet (R26.81);Pain Pain - part of body:  (back)     Time: 5621-3086 PT Time Calculation (min) (ACUTE ONLY): 56 min  Charges:    $Gait Training: 23-37 mins $Therapeutic Activity: 23-37 mins PT General Charges $$ ACUTE PT VISIT: 1 Visit                     [name], PT, DPT Acute Rehabilitation Services Secure Chat Preferred Office: [phone]    [name] 12/06/2022, 11:50 AM

## 2022-12-06 NOTE — Inpatient Diabetes Management (Addendum)
Inpatient Diabetes Program Recommendations  AACE/ADA: New Consensus Statement on Inpatient Glycemic Control (2015)  Target Ranges:  Prepandial:   less than 140 mg/dL      Peak postprandial:   less than 180 mg/dL (1-2 hours)      Critically ill patients:  140 - 180 mg/dL   Lab Results  Component Value Date   GLUCAP 202 (H) 12/06/2022   HGBA1C 8.9 (H) 11/26/2022    Review of Glycemic Control  Latest Reference Range & Units 12/05/22 06:21 12/05/22 12:22 12/05/22 16:58 12/05/22 21:11 12/06/22 06:14  Glucose-Capillary 70 - 99 mg/dL 161 (H) 096 (H) 045 (H) 204 (H) 202 (H)   Diabetes history: DM  Outpatient Diabetes medications:  Jardiance 10 mg q AM Tradjenta 5 mg daily Current orders for Inpatient glycemic control:  Novolog moderate (0-15 units) tid with meals and HS Jardiance 10 mg daily Tradjenta 5 mg daily  Inpatient Diabetes Program Recommendations:    Note blood sugars>goal.  A1C indicates A1C of 8.9% which indicates average blood sugar of 208 mg/dL.   Consider adding Semglee 10 units daily while in the hospital.  Likely needs adjustments in outpatient DM medications as well.   Addendum 12:15p- Spoke with patient regarding A1C and importance of good glucose control post-op.  Discussed keeping CBG's less than 180 mg/dL and importance of alerting PCP if blood sugars are greater than goals.  Patient anticipates d/c to rehab facility at discharge.    Thanks,  Beryl Meager, RN, BC-ADM Inpatient Diabetes Coordinator Pager [phone] (8a-5p)

## 2022-12-06 NOTE — Progress Notes (Addendum)
Neurosurgery Service Progress Note  Subjective: No acute events overnight, back pain slowly improving, had some somnolence and confusion after hydromorphone yesterday, feeling better today   Objective: Vitals:   12/05/22 2137 12/06/22 0012 12/06/22 0324 12/06/22 0749  BP: (!) 112/53 (!) 117/53 (!) 109/51 (!) 100/51  Pulse: 80 84 89 87  Resp:  19 18 17   Temp:  99 F (37.2 C) 98.8 F (37.1 C) 99.9 F (37.7 C)  TempSrc:  Oral Oral Oral  SpO2:  96% 96% 95%  Weight:      Height:        Physical Exam: Strength 5/5 x4 and SILTx4, incision c/d/I, dressing with minimal bloody staining, no active bleeding  Assessment & Plan: 77 y.o. woman s/p open 4-5/5-1 lami / TLIF / PSF, recovering well.  -d/c hydromorphone -SNF pending, medically ready when bed available -foley d/c'd -ISS for DM2, elevated Bgs but can hold off on higher intensity treatment given some into the 150s -SCDs/TEDs, cont SQH  Jadene Pierini  12/06/22 8:43 AM

## 2022-12-06 NOTE — Discharge Summary (Signed)
Discharge Summary  Date of Admission: 12/04/2022  Date of Discharge: 12/10/22  Attending Physician: Autumn Patty, MD  Hospital Course: Patient was admitted following an uncomplicated open 4-5/5-1 lami / TLIF / PSF. She was recovered in PACU and transferred to a regular nursing floor. Her hospital course was uncomplicated, PT/OT recommended SNF, and the patient was discharged to SNF on 12/10/22. She will follow up in clinic with me in clinic in 2 weeks.  Neurologic exam at discharge:  Strength 5/5 x4, SILTx4  Discharge Diagnosis: Lumbar spondylolisthesis, lumbar radiculopathy  Diet: Resume regular diet  Discharge Medications: Allergies as of 12/10/2022       Reactions   Ace Inhibitors Anaphylaxis, Swelling   Celecoxib Other (See Comments)    Kidney issue   Hydrocodone-acetaminophen Itching   BRAND NAME: VICODIN   Zofran [ondansetron Hcl] Other (See Comments)   HICCUPS        Medication List     TAKE these medications    Accu-Chek Guide test strip Generic drug: glucose blood TESTING TWICE DAILYT   Accu-Chek Softclix Lancets lancets 2 (two) times daily.   acetaminophen 500 MG tablet Commonly known as: TYLENOL Take 1,000 mg by mouth every 6 (six) hours as needed for moderate pain.   albuterol 108 (90 Base) MCG/ACT inhaler Commonly known as: VENTOLIN HFA Inhale 1-2 puffs into the lungs every 6 (six) hours as needed for wheezing or shortness of breath.   aspirin 81 MG tablet Take 81 mg by mouth every evening.   BENGAY EX Apply 1 Application topically daily as needed (pain).   bisoprolol 10 MG tablet Commonly known as: ZEBETA Take 10 mg by mouth daily.   CASTIVA WARMING EX Apply 1 Application topically daily as needed (pain).   cyclobenzaprine 10 MG tablet Commonly known as: FLEXERIL Take 1 tablet (10 mg total) by mouth 3 (three) times daily as needed for muscle spasms.   Fish Oil 1200 MG Caps Take 1,200 mg by mouth daily.   HYDROcodone-acetaminophen 5-325  MG tablet Commonly known as: NORCO/VICODIN Take 1 tablet by mouth every 8 (eight) hours as needed (pain). What changed: reasons to take this   Jardiance 10 MG Tabs tablet Generic drug: empagliflozin Take 10 mg by mouth every morning.   Joint Health Caps Take 1-2 capsules by mouth daily.   lidocaine 5 % Commonly known as: Lidoderm Place 1 patch onto the skin daily. Remove & Discard patch within 12 hours or as directed by MD   linagliptin 5 MG Tabs tablet Commonly known as: TRADJENTA Take 5 mg by mouth daily.   metFORMIN 500 MG tablet Commonly known as: GLUCOPHAGE Take 500 mg by mouth 2 (two) times daily with a meal.   NIFEdipine 90 MG 24 hr tablet Commonly known as: PROCARDIA XL/NIFEDICAL-XL Take 90 mg by mouth daily.   pantoprazole 40 MG tablet Commonly known as: PROTONIX Take 40 mg by mouth daily.   simvastatin 20 MG tablet Commonly known as: ZOCOR Take 20 mg by mouth At bedtime.   trolamine salicylate 10 % cream Commonly known as: ASPERCREME Apply 1 Application topically as needed for muscle pain.   Vitamin D3 125 MCG (5000 UT) Caps Take 5,000 Units by mouth daily.   zolpidem 10 MG tablet Commonly known as: AMBIEN Take 10 mg by mouth at bedtime.               Discharge Care Instructions  (From admission, onward)           Start  Ordered   12/10/22 0000  Discharge wound care:       Comments: Discharge Instructions  No restriction in activities, slowly increase your activity back to normal.   Your incision is closed with dermabond (purple glue). This will naturally fall off over the next 1-2 weeks.   Okay to shower on the day of discharge. Use regular soap and water and try to be gentle when cleaning your incision.   Follow up with Dr. Maurice Small in 2 weeks after discharge. If you do not already have a discharge appointment, please call his office at [phone] to schedule a follow up appointment. If you have any concerns or questions,  please call the office and let us know.   12/10/22 1030

## 2022-12-06 NOTE — Plan of Care (Signed)
  Problem: Education: Goal: Knowledge of General Education information will improve Description: Including pain rating scale, medication(s)/side effects and non-pharmacologic comfort measures Outcome: Progressing   Problem: Health Behavior/Discharge Planning: Goal: Ability to manage health-related needs will improve Outcome: Progressing   Problem: Clinical Measurements: Goal: Ability to maintain clinical measurements within normal limits will improve Outcome: Progressing Goal: Will remain free from infection Outcome: Progressing Goal: Diagnostic test results will improve Outcome: Progressing Goal: Respiratory complications will improve Outcome: Progressing Goal: Cardiovascular complication will be avoided Outcome: Progressing   Problem: Nutrition: Goal: Adequate nutrition will be maintained Outcome: Progressing   Problem: Coping: Goal: Level of anxiety will decrease Outcome: Progressing   Problem: Elimination: Goal: Will not experience complications related to bowel motility Outcome: Progressing Goal: Will not experience complications related to urinary retention Outcome: Progressing   Problem: Pain Managment: Goal: General experience of comfort will improve Outcome: Progressing   Problem: Safety: Goal: Ability to remain free from injury will improve Outcome: Progressing   Problem: Skin Integrity: Goal: Risk for impaired skin integrity will decrease Outcome: Progressing   Problem: Education: Goal: Ability to describe self-care measures that may prevent or decrease complications (Diabetes Survival Skills Education) will improve Outcome: Progressing Goal: Individualized Educational Video(s) Outcome: Progressing   Problem: Coping: Goal: Ability to adjust to condition or change in health will improve Outcome: Progressing   Problem: Fluid Volume: Goal: Ability to maintain a balanced intake and output will improve Outcome: Progressing   Problem: Health  Behavior/Discharge Planning: Goal: Ability to identify and utilize available resources and services will improve Outcome: Progressing Goal: Ability to manage health-related needs will improve Outcome: Progressing   Problem: Metabolic: Goal: Ability to maintain appropriate glucose levels will improve Outcome: Progressing   Problem: Nutritional: Goal: Maintenance of adequate nutrition will improve Outcome: Progressing Goal: Progress toward achieving an optimal weight will improve Outcome: Progressing   Problem: Skin Integrity: Goal: Risk for impaired skin integrity will decrease Outcome: Progressing   Problem: Tissue Perfusion: Goal: Adequacy of tissue perfusion will improve Outcome: Progressing   Problem: Education: Goal: Ability to verbalize activity precautions or restrictions will improve Outcome: Progressing Goal: Knowledge of the prescribed therapeutic regimen will improve Outcome: Progressing Goal: Understanding of discharge needs will improve Outcome: Progressing   Problem: Activity: Goal: Ability to avoid complications of mobility impairment will improve Outcome: Progressing Goal: Ability to tolerate increased activity will improve Outcome: Progressing Goal: Will remain free from falls Outcome: Progressing   Problem: Bowel/Gastric: Goal: Gastrointestinal status for postoperative course will improve Outcome: Progressing   Problem: Clinical Measurements: Goal: Ability to maintain clinical measurements within normal limits will improve Outcome: Progressing Goal: Postoperative complications will be avoided or minimized Outcome: Progressing Goal: Diagnostic test results will improve Outcome: Progressing   Problem: Skin Integrity: Goal: Will show signs of wound healing Outcome: Progressing   Problem: Health Behavior/Discharge Planning: Goal: Identification of resources available to assist in meeting health care needs will improve Outcome: Progressing    Problem: Bladder/Genitourinary: Goal: Urinary functional status for postoperative course will improve Outcome: Progressing

## 2022-12-07 LAB — GLUCOSE, CAPILLARY
Glucose-Capillary: 159 mg/dL — ABNORMAL HIGH (ref 70–99)
Glucose-Capillary: 142 mg/dL — ABNORMAL HIGH (ref 70–99)
Glucose-Capillary: 213 mg/dL — ABNORMAL HIGH (ref 70–99)
Glucose-Capillary: 165 mg/dL — ABNORMAL HIGH (ref 70–99)
Glucose-Capillary: 167 mg/dL — ABNORMAL HIGH (ref 70–99)
Glucose-Capillary: 178 mg/dL — ABNORMAL HIGH (ref 70–99)

## 2022-12-07 NOTE — Progress Notes (Signed)
Occupational Therapy Treatment Patient Details Name: [name] MRN: [id number] DOB: June 05, 1945 Today's Date: 12/07/2022   History of present illness Pt is a 77 y/o female who presents s/p L4-S1 TLIF on 12/04/2022. PMH significant for CKD, COPD, DVT, DM, HTN, multinodular goiter, R knee surgery, neck surgery.   OT comments  PT informed COTA that patient had soft BP earlier with mobility. Patient in recliner in reclined position and positioned up right with BP 98/48. Patient and family provided education on reacher and sock aide for LB dressing with simulated donning/doffing pants with reacher and donning/doffing socks with reacher and sock aide. Patient educated on other adaptive equipment for LB dressing. Patient will benefit from continued inpatient follow up therapy, <3 hours/day to continue to address LB dressing and functional transfers.       If plan is discharge home, recommend the following:  A little help with walking and/or transfers;A lot of help with bathing/dressing/bathroom;Assistance with cooking/housework;Assist for transportation;Help with stairs or ramp for entrance   Equipment Recommendations  Other (comment) (defer)    Recommendations for Other Services      Precautions / Restrictions Precautions Precautions: Fall;Back Precaution Booklet Issued: Yes (comment) Precaution Comments: reviewed precautions with patient able to recall after review Required Braces or Orthoses: Spinal Brace Spinal Brace: Lumbar corset;Applied in sitting position Restrictions Weight Bearing Restrictions: No       Mobility Bed Mobility               General bed mobility comments: OOB in recliner    Transfers                   General transfer comment: patient seen seated due to soft BP     Balance Overall balance assessment: Needs assistance Sitting-balance support: Feet supported, No upper extremity supported Sitting balance-Leahy Scale: Fair Sitting balance -  Comments: seated in recliner                                   ADL either performed or assessed with clinical judgement   ADL Overall ADL's : Needs assistance/impaired     Grooming: Wash/dry hands;Wash/dry face;Oral care;Set up;Sitting               Lower Body Dressing: Moderate assistance;With adaptive equipment;Sitting/lateral leans Lower Body Dressing Details (indicate cue type and reason): patient and family education on AE for LB dressing and what to purchase               General ADL Comments: seen seated in recliner with no standing due to soft BP    Extremity/Trunk Assessment              Vision       Perception     Praxis      Cognition Arousal: Alert Behavior During Therapy: WFL for tasks assessed/performed Overall Cognitive Status: Within Functional Limits for tasks assessed                                          Exercises      Shoulder Instructions       General Comments BP seated in recliner at beginning of session 98/48 and 100/49 at end of sesson    Pertinent Vitals/ Pain       Pain Assessment Pain Assessment: Faces  Faces Pain Scale: Hurts whole lot Pain Location: Back/incision site Pain Descriptors / Indicators: Operative site guarding, Sore, Moaning Pain Intervention(s): Limited activity within patient's tolerance, Monitored during session, Repositioned  Home Living                                          Prior Functioning/Environment              Frequency  Min 1X/week        Progress Toward Goals  OT Goals(current goals can now be found in the care plan section)  Progress towards OT goals: Progressing toward goals  Acute Rehab OT Goals Patient Stated Goal: get more rehab at [facility] OT Goal Formulation: With patient Time For Goal Achievement: 12/19/22 Potential to Achieve Goals: Fair ADL Goals Pt Will Perform Grooming: with contact guard  assist;standing Pt Will Perform Lower Body Dressing: sit to/from stand;with adaptive equipment;with contact guard assist Pt Will Transfer to Toilet: with supervision;ambulating;bedside commode Pt Will Perform Toileting - Clothing Manipulation and hygiene: with supervision;sitting/lateral leans;sit to/from stand Additional ADL Goal #1: Pt will recall and adhere to back precautions without cueing during ADls.  Plan      Co-evaluation                 AM-PAC OT "6 Clicks" Daily Activity     Outcome Measure   Help from another person eating meals?: None Help from another person taking care of personal grooming?: A Little Help from another person toileting, which includes using toliet, bedpan, or urinal?: A Lot Help from another person bathing (including washing, rinsing, drying)?: A Lot Help from another person to put on and taking off regular upper body clothing?: A Little Help from another person to put on and taking off regular lower body clothing?: A Lot 6 Click Score: 16    End of Session Equipment Utilized During Treatment: Other (comment) (reacher and sock aide)  OT Visit Diagnosis: Other abnormalities of gait and mobility (R26.89);Muscle weakness (generalized) (M62.81);Pain Pain - part of body:  (back)   Activity Tolerance Patient tolerated treatment well   Patient Left in chair;with call bell/phone within reach;with chair alarm set;with family/visitor present   Nurse Communication Other (comment) (soft BP)        Time: 2841-3244 OT Time Calculation (min): 32 min  Charges: OT General Charges $OT Visit: 1 Visit OT Treatments $Self Care/Home Management : 23-37 mins  Alfonse Flavors, OTA Acute Rehabilitation Services  Office [phone]   Dewain Penning 12/07/2022, 1:54 PM

## 2022-12-07 NOTE — Progress Notes (Signed)
Physical Therapy Treatment  Patient Details Name: [name] MRN: [id number] DOB: December 05, 1945 Today's Date: 12/07/2022   History of Present Illness Pt is a 77 y/o female who presents s/p L4-S1 TLIF on 12/04/2022. PMH significant for CKD, COPD, DVT, DM, HTN, multinodular goiter, R knee surgery, neck surgery.    PT Comments  Pt progressing with post-op mobility. She was able to demonstrate transfers and ambulation with gross min guard assist to min assist and RW for support. Limited by pain at times but appears improved this session overall. Noted soft BP after ambulation 89/50, improved with reclined position in chair up to 97/52. RN notified. Pt was educated on precautions, brace application/wearing schedule, appropriate activity progression, and car transfer. Will continue to follow.      If plan is discharge home, recommend the following: A little help with walking and/or transfers;A little help with bathing/dressing/bathroom;Assistance with cooking/housework;Assist for transportation;Help with stairs or ramp for entrance   Can travel by private vehicle     Yes  Equipment Recommendations  Other (comment) (TBD by next venue of care)    Recommendations for Other Services       Precautions / Restrictions Precautions Precautions: Fall;Back Precaution Booklet Issued: Yes (comment) Precaution Comments: Reviewed handout and pt was cued for precautions during functional mobility. Required Braces or Orthoses: Spinal Brace Spinal Brace: Lumbar corset;Applied in sitting position Restrictions Weight Bearing Restrictions: No     Mobility  Bed Mobility               General bed mobility comments: Pt was received sitting up on BSC with NT present.    Transfers Overall transfer level: Needs assistance Equipment used: Rolling walker (2 wheels) Transfers: Sit to/from Stand Sit to Stand: Min assist           General transfer comment: Increased time due to pain. VC's for hand  placement on seated surface for safety.    Ambulation/Gait Ambulation/Gait assistance: Min assist Gait Distance (Feet): 60 Feet Assistive device: Rolling walker (2 wheels) Gait Pattern/deviations: Step-through pattern, Decreased stride length, Trunk flexed Gait velocity: Decreased Gait velocity interpretation: 1.31 - 2.62 ft/sec, indicative of limited community ambulator   General Gait Details: Slow, guarded, and effortful due to pain. Occasional min assist provided for light balance support and walker management. Several standing rest breaks required due to fatigue and pain.   Stairs             Wheelchair Mobility     Tilt Bed    Modified Rankin (Stroke Patients Only)       Balance Overall balance assessment: Needs assistance Sitting-balance support: Feet supported, No upper extremity supported Sitting balance-Leahy Scale: Fair     Standing balance support: During functional activity, No upper extremity supported, Reliant on assistive device for balance Standing balance-Leahy Scale: Poor Standing balance comment: relies on BUE and external support                            Cognition Arousal: Alert Behavior During Therapy: WFL for tasks assessed/performed Overall Cognitive Status: Within Functional Limits for tasks assessed                                          Exercises      General Comments        Pertinent Vitals/Pain Pain Assessment Pain  Assessment: Faces Faces Pain Scale: Hurts whole lot Pain Location: Back/incision site Pain Descriptors / Indicators: Operative site guarding, Sore, Moaning Pain Intervention(s): Limited activity within patient's tolerance, Monitored during session, Repositioned    Home Living                          Prior Function            PT Goals (current goals can now be found in the care plan section) Acute Rehab PT Goals Patient Stated Goal: [facility] at d/c for  rehab PT Goal Formulation: With patient/family Time For Goal Achievement: 12/19/22 Potential to Achieve Goals: Good Progress towards PT goals: Progressing toward goals    Frequency    Min 1X/week      PT Plan      Co-evaluation              AM-PAC PT "6 Clicks" Mobility   Outcome Measure  Help needed turning from your back to your side while in a flat bed without using bedrails?: A Little Help needed moving from lying on your back to sitting on the side of a flat bed without using bedrails?: A Lot Help needed moving to and from a bed to a chair (including a wheelchair)?: A Little Help needed standing up from a chair using your arms (e.g., wheelchair or bedside chair)?: A Little Help needed to walk in hospital room?: A Little Help needed climbing 3-5 steps with a railing? : A Little 6 Click Score: 17    End of Session Equipment Utilized During Treatment: Gait belt;Back brace Activity Tolerance: Patient tolerated treatment well Patient left: in bed;with call bell/phone within reach;with family/visitor present Nurse Communication: Mobility status PT Visit Diagnosis: Unsteadiness on feet (R26.81);Pain Pain - part of body:  (back)     Time: 0930-1006 PT Time Calculation (min) (ACUTE ONLY): 36 min  Charges:    $Gait Training: 23-37 mins PT General Charges $$ ACUTE PT VISIT: 1 Visit                     Conni Slipper, PT, DPT Acute Rehabilitation Services Secure Chat Preferred Office: [phone]    [name] 12/07/2022, 10:22 AM

## 2022-12-07 NOTE — Plan of Care (Signed)
  Problem: Education: Goal: Knowledge of General Education information will improve Description: Including pain rating scale, medication(s)/side effects and non-pharmacologic comfort measures Outcome: Progressing   Problem: Health Behavior/Discharge Planning: Goal: Ability to manage health-related needs will improve Outcome: Progressing   Problem: Clinical Measurements: Goal: Ability to maintain clinical measurements within normal limits will improve Outcome: Progressing Goal: Will remain free from infection Outcome: Progressing Goal: Respiratory complications will improve Outcome: Progressing   Problem: Activity: Goal: Risk for activity intolerance will decrease Outcome: Progressing   Problem: Pain Managment: Goal: General experience of comfort will improve Outcome: Progressing   Problem: Safety: Goal: Ability to remain free from injury will improve Outcome: Progressing

## 2022-12-07 NOTE — Progress Notes (Signed)
Neurosurgery Service Progress Note  Subjective: No acute events overnight. Back pain is slowly improving, but is still severe with movement. Per nursing her BP was asymptomatically soft this morning, BP meds were held.   Objective: Vitals:   12/07/22 0352 12/07/22 0533 12/07/22 0737 12/07/22 0759  BP: (!) 106/57  (!) 98/49 (!) 106/51  Pulse: 96  86   Resp: 18  18   Temp: (!) 101.1 F (38.4 C) 99.7 F (37.6 C) 99.5 F (37.5 C)   TempSrc: Oral Oral Oral   SpO2: 94%  (!) 89%   Weight:      Height:        Physical Exam: Strength 5/5 x4 and SILTx4, incision c/d/I, dressing with minimal bloody staining, no active bleeding   Assessment & Plan: 77 y.o. woman s/p open 4-5/5-1 lami / TLIF / PSF, recovering well.   -continue PT/OT -SNF pending insurance approval, medically ready once approved and bed is available  -SCDs/TEDs, cont SQH  -if BP continues to be soft consider saline bolus, continue to hold BP meds while BP is soft   Emilee Hero, PA-C 12/07/22 12:01 PM

## 2022-12-07 NOTE — Plan of Care (Signed)
  Problem: Education: Goal: Knowledge of General Education information will improve Description: Including pain rating scale, medication(s)/side effects and non-pharmacologic comfort measures Outcome: Progressing   Problem: Health Behavior/Discharge Planning: Goal: Ability to manage health-related needs will improve Outcome: Progressing   Problem: Clinical Measurements: Goal: Ability to maintain clinical measurements within normal limits will improve Outcome: Progressing Goal: Will remain free from infection Outcome: Progressing Goal: Diagnostic test results will improve Outcome: Progressing Goal: Respiratory complications will improve Outcome: Progressing Goal: Cardiovascular complication will be avoided Outcome: Progressing   Problem: Activity: Goal: Risk for activity intolerance will decrease Outcome: Progressing   Problem: Nutrition: Goal: Adequate nutrition will be maintained Outcome: Progressing   Problem: Coping: Goal: Level of anxiety will decrease Outcome: Progressing   Problem: Elimination: Goal: Will not experience complications related to bowel motility Outcome: Progressing Goal: Will not experience complications related to urinary retention Outcome: Progressing   Problem: Pain Managment: Goal: General experience of comfort will improve Outcome: Progressing   Problem: Safety: Goal: Ability to remain free from injury will improve Outcome: Progressing   Problem: Skin Integrity: Goal: Risk for impaired skin integrity will decrease Outcome: Progressing   Problem: Education: Goal: Ability to describe self-care measures that may prevent or decrease complications (Diabetes Survival Skills Education) will improve Outcome: Progressing Goal: Individualized Educational Video(s) Outcome: Progressing   Problem: Coping: Goal: Ability to adjust to condition or change in health will improve Outcome: Progressing   Problem: Fluid Volume: Goal: Ability to  maintain a balanced intake and output will improve Outcome: Progressing   Problem: Health Behavior/Discharge Planning: Goal: Ability to identify and utilize available resources and services will improve Outcome: Progressing Goal: Ability to manage health-related needs will improve Outcome: Progressing   Problem: Metabolic: Goal: Ability to maintain appropriate glucose levels will improve Outcome: Progressing   Problem: Nutritional: Goal: Maintenance of adequate nutrition will improve Outcome: Progressing Goal: Progress toward achieving an optimal weight will improve Outcome: Progressing   Problem: Skin Integrity: Goal: Risk for impaired skin integrity will decrease Outcome: Progressing   Problem: Tissue Perfusion: Goal: Adequacy of tissue perfusion will improve Outcome: Progressing   Problem: Education: Goal: Ability to verbalize activity precautions or restrictions will improve Outcome: Progressing Goal: Knowledge of the prescribed therapeutic regimen will improve Outcome: Progressing Goal: Understanding of discharge needs will improve Outcome: Progressing   Problem: Activity: Goal: Ability to avoid complications of mobility impairment will improve Outcome: Progressing Goal: Ability to tolerate increased activity will improve Outcome: Progressing Goal: Will remain free from falls Outcome: Progressing   Problem: Bowel/Gastric: Goal: Gastrointestinal status for postoperative course will improve Outcome: Progressing   Problem: Clinical Measurements: Goal: Ability to maintain clinical measurements within normal limits will improve Outcome: Progressing Goal: Postoperative complications will be avoided or minimized Outcome: Progressing Goal: Diagnostic test results will improve Outcome: Progressing   Problem: Pain Management: Goal: Pain level will decrease Outcome: Progressing   Problem: Skin Integrity: Goal: Will show signs of wound healing Outcome:  Progressing   Problem: Health Behavior/Discharge Planning: Goal: Identification of resources available to assist in meeting health care needs will improve Outcome: Progressing   Problem: Bladder/Genitourinary: Goal: Urinary functional status for postoperative course will improve Outcome: Progressing   

## 2022-12-07 NOTE — Progress Notes (Signed)
Called neurosurgery to report blood pressure 88/55 patient is asymptomatic and her bp meds were held. Spoke with Lurena Joiner, who notified Verlin Dike NP and Lurena Joiner will call back if any other intervention will be done.

## 2022-12-07 NOTE — Care Management Important Message (Signed)
Important Message  Patient Details  Name: [name] MRN: [id number] Date of Birth: [birth year]   Medicare Important Message Given:  Yes     Dorena Bodo 12/07/2022, 2:42 PM

## 2022-12-08 LAB — GLUCOSE, CAPILLARY
Glucose-Capillary: 158 mg/dL — ABNORMAL HIGH (ref 70–99)
Glucose-Capillary: 132 mg/dL — ABNORMAL HIGH (ref 70–99)
Glucose-Capillary: 103 mg/dL — ABNORMAL HIGH (ref 70–99)
Glucose-Capillary: 220 mg/dL — ABNORMAL HIGH (ref 70–99)

## 2022-12-08 NOTE — Progress Notes (Signed)
Physical Therapy Treatment Patient Details Name: [name] MRN: [id number] DOB: [birth year] Today's Date: 12/08/2022   History of Present Illness Pt is a 77 y/o female who presents s/p L4-S1 TLIF on 12/04/2022. PMH significant for CKD, COPD, DVT, DM, HTN, multinodular goiter, R knee surgery, neck surgery.    PT Comments  Pt received in supine after recently getting back to bed from chair, brace still donned, pt agreeable to therapy session with emphasis on log rolling and back precaution compliance during bed mobility and transfers and gait training with RW support. Pt too fatigued and with too high of pain score to attempt stair training this date, pt needing up to modA for bed mobility with use of bed rails/HOB elevation and up to minA +2 for transfers/gait with chair follow for safety. Pt BP stable supine/sitting/standing this date and able to slightly progress gait distance in hallway today. Pt spouse also instructed on back precautions and handout in room to reinforce. Pt continues to benefit from PT services to progress toward functional mobility goals.     If plan is discharge home, recommend the following: A little help with bathing/dressing/bathroom;Assistance with cooking/housework;Assist for transportation;Help with stairs or ramp for entrance;A lot of help with walking and/or transfers   Can travel by private vehicle     Yes  Equipment Recommendations  Other (comment) (TBD by next venue of care)    Recommendations for Other Services       Precautions / Restrictions Precautions Precautions: Fall;Back Precaution Booklet Issued: Yes (comment) Precaution Comments: reviewed precautions with patient able to recall after review, spouse arrived toward end of session and also instructed on precs via handout Required Braces or Orthoses: Spinal Brace Spinal Brace: Lumbar corset;Applied in sitting position Restrictions Weight Bearing Restrictions: No     Mobility  Bed  Mobility Overal bed mobility: Needs Assistance Bed Mobility: Rolling, Sidelying to Sit, Sit to Sidelying Rolling: Min assist, Used rails Sidelying to sit: Mod assist, Used rails, HOB elevated     Sit to sidelying: Used rails, Min assist General bed mobility comments: Pt and spouse instructed on technique, cues for pursed-lip breathing; minA for BLE mgmt but modA for trunk rise due to severe c/o pain sitting up to L EOB.    Transfers Overall transfer level: Needs assistance Equipment used: Rolling walker (2 wheels) Transfers: Sit to/from Stand Sit to Stand: Min assist, From elevated surface           General transfer comment: cues for safe UE placement, no bending past 90* precs and anterior scooting prior to standing from elevated surface to maintain precs safely. BP stable sitting and standing when checked due to pt c/o nausea/dizziness.    Ambulation/Gait Ambulation/Gait assistance: Min assist, +2 safety/equipment Gait Distance (Feet): 65 Feet Assistive device: Rolling walker (2 wheels) Gait Pattern/deviations: Step-through pattern, Decreased stride length, Trunk flexed Gait velocity: Grossly <0.3 m/s     General Gait Details: Slow, guarded, and effortful due to pain. Occasional min assist provided for light balance support and walker management. Several standing rest breaks required due to fatigue and pain. Chair follow for safety but pt able to return to room/EOB without needing seated break.   Stairs Stairs:  (pt too pained to attempt today)           Wheelchair Mobility     Tilt Bed    Modified Rankin (Stroke Patients Only)       Balance Overall balance assessment: Needs assistance Sitting-balance support: Feet supported, No upper  extremity supported, Single extremity supported Sitting balance-Leahy Scale: Fair Sitting balance - Comments: EOB   Standing balance support: During functional activity, No upper extremity supported, Reliant on assistive  device for balance Standing balance-Leahy Scale: Poor Standing balance comment: relies on BUE and external support                            Cognition Arousal: Alert Behavior During Therapy: WFL for tasks assessed/performed Overall Cognitive Status: Within Functional Limits for tasks assessed                                 General Comments: Pt recalls 2/3 back precs. Good participation despite severe c/o pain.        Exercises Other Exercises Other Exercises: supine BLE AROM: ankle pumps, seated BLE AROM: LAQ ~5 reps ea for teachback Other Exercises: IS x 5 reps    General Comments General comments (skin integrity, edema, etc.): BP 132/53 (84) supine; BP 136/66 HR 79 bpm standing; SBP WFL standing in 130's, did not record due to PTA physically assisting pt, machine battery was low so not written down.      Pertinent Vitals/Pain Pain Assessment Pain Assessment: Faces Faces Pain Scale: Hurts whole lot Pain Location: Back/incision site with log roll bed mobility and sit<>stand Pain Descriptors / Indicators: Operative site guarding, Sore, Moaning Pain Intervention(s): Monitored during session, Limited activity within patient's tolerance, Premedicated before session, Repositioned, Ice applied    Home Living                          Prior Function            PT Goals (current goals can now be found in the care plan section) Acute Rehab PT Goals Patient Stated Goal: [facility] at d/c for rehab PT Goal Formulation: With patient/family Time For Goal Achievement: 12/19/22 Progress towards PT goals: Progressing toward goals    Frequency    Min 1X/week      PT Plan      Co-evaluation              AM-PAC PT "6 Clicks" Mobility   Outcome Measure  Help needed turning from your back to your side while in a flat bed without using bedrails?: A Little Help needed moving from lying on your back to sitting on the side of a flat bed  without using bedrails?: A Lot Help needed moving to and from a bed to a chair (including a wheelchair)?: A Little Help needed standing up from a chair using your arms (e.g., wheelchair or bedside chair)?: A Little Help needed to walk in hospital room?: A Lot Help needed climbing 3-5 steps with a railing? : Total (too fatigued today) 6 Click Score: 14    End of Session Equipment Utilized During Treatment: Gait belt;Back brace Activity Tolerance: Patient tolerated treatment well;Patient limited by pain Patient left: in bed;with call bell/phone within reach;with bed alarm set;with family/visitor present;Other (comment) (ice pack to her back for comfort, reviewed not to leave on >20 mins at a time. pt defers chair as she just returned from sitting in chair ~20 mins) Nurse Communication: Mobility status PT Visit Diagnosis: Unsteadiness on feet (R26.81);Pain Pain - part of body:  (back)     Time: 8295-6213 PT Time Calculation (min) (ACUTE ONLY): 31 min  Charges:    $  Gait Training: 8-22 mins $Therapeutic Activity: 8-22 mins PT General Charges $$ ACUTE PT VISIT: 1 Visit                     Mayeli Bornhorst P., PTA Acute Rehabilitation Services Secure Chat Preferred 9a-5:30pm Office: [phone]    Dorathy Kinsman [facility] 12/08/2022, 3:52 PM

## 2022-12-08 NOTE — Plan of Care (Signed)

## 2022-12-08 NOTE — Plan of Care (Signed)
  Problem: Education: Goal: Knowledge of General Education information will improve Description: Including pain rating scale, medication(s)/side effects and non-pharmacologic comfort measures Outcome: Progressing   Problem: Health Behavior/Discharge Planning: Goal: Ability to manage health-related needs will improve Outcome: Progressing   Problem: Clinical Measurements: Goal: Ability to maintain clinical measurements within normal limits will improve Outcome: Progressing Goal: Will remain free from infection Outcome: Progressing Goal: Diagnostic test results will improve Outcome: Progressing Goal: Respiratory complications will improve Outcome: Progressing Goal: Cardiovascular complication will be avoided Outcome: Progressing   Problem: Activity: Goal: Risk for activity intolerance will decrease Outcome: Progressing   Problem: Nutrition: Goal: Adequate nutrition will be maintained Outcome: Progressing   Problem: Coping: Goal: Level of anxiety will decrease Outcome: Progressing   Problem: Elimination: Goal: Will not experience complications related to bowel motility Outcome: Progressing Goal: Will not experience complications related to urinary retention Outcome: Progressing   Problem: Pain Managment: Goal: General experience of comfort will improve Outcome: Progressing   Problem: Safety: Goal: Ability to remain free from injury will improve Outcome: Progressing   Problem: Skin Integrity: Goal: Risk for impaired skin integrity will decrease Outcome: Progressing   Problem: Education: Goal: Ability to describe self-care measures that may prevent or decrease complications (Diabetes Survival Skills Education) will improve Outcome: Progressing Goal: Individualized Educational Video(s) Outcome: Progressing   Problem: Coping: Goal: Ability to adjust to condition or change in health will improve Outcome: Progressing   Problem: Fluid Volume: Goal: Ability to  maintain a balanced intake and output will improve Outcome: Progressing   Problem: Health Behavior/Discharge Planning: Goal: Ability to identify and utilize available resources and services will improve Outcome: Progressing Goal: Ability to manage health-related needs will improve Outcome: Progressing   Problem: Metabolic: Goal: Ability to maintain appropriate glucose levels will improve Outcome: Progressing   Problem: Nutritional: Goal: Maintenance of adequate nutrition will improve Outcome: Progressing Goal: Progress toward achieving an optimal weight will improve Outcome: Progressing   Problem: Skin Integrity: Goal: Risk for impaired skin integrity will decrease Outcome: Progressing   Problem: Tissue Perfusion: Goal: Adequacy of tissue perfusion will improve Outcome: Progressing   Problem: Education: Goal: Ability to verbalize activity precautions or restrictions will improve Outcome: Progressing Goal: Knowledge of the prescribed therapeutic regimen will improve Outcome: Progressing Goal: Understanding of discharge needs will improve Outcome: Progressing   Problem: Activity: Goal: Ability to avoid complications of mobility impairment will improve Outcome: Progressing Goal: Ability to tolerate increased activity will improve Outcome: Progressing Goal: Will remain free from falls Outcome: Progressing   Problem: Bowel/Gastric: Goal: Gastrointestinal status for postoperative course will improve Outcome: Progressing   Problem: Clinical Measurements: Goal: Ability to maintain clinical measurements within normal limits will improve Outcome: Progressing Goal: Postoperative complications will be avoided or minimized Outcome: Progressing Goal: Diagnostic test results will improve Outcome: Progressing   Problem: Pain Management: Goal: Pain level will decrease Outcome: Progressing   Problem: Skin Integrity: Goal: Will show signs of wound healing Outcome:  Progressing   Problem: Health Behavior/Discharge Planning: Goal: Identification of resources available to assist in meeting health care needs will improve Outcome: Progressing   Problem: Bladder/Genitourinary: Goal: Urinary functional status for postoperative course will improve Outcome: Progressing   

## 2022-12-08 NOTE — Progress Notes (Signed)
NEUROSURGERY PROGRESS NOTE  Doing well. Complains of appropriate back pain. Has been ambulating hallway with PT. Awaiting SNF placement.  Temp:  [97.8 F (36.6 C)-99.1 F (37.3 C)] 98.9 F (37.2 C) (09/07 0841) Pulse Rate:  [82-88] 82 (09/07 0841) Resp:  [16-19] 17 (09/07 0841) BP: (101-126)/(51-60) 101/60 (09/07 0841) SpO2:  [95 %-99 %] 98 % (09/07 0841)    Sherryl Manges, NP 12/08/2022 9:40 AM

## 2022-12-08 NOTE — TOC Progression Note (Signed)
Received verbal handoff from weekday SW that pt has a bed at [facility] pending auth.   Per [facility], pt's request for auth went to med review and they are offering a peer-to-peer review.   Review needs to be completed by 9/9 at 12pm. Number for review is [phone], option 5. Will need pt's name, DOB, and member ID ([id number]).   Dellie Burns, MSW, LCSW [phone] (coverage)

## 2022-12-09 LAB — GLUCOSE, CAPILLARY
Glucose-Capillary: 168 mg/dL — ABNORMAL HIGH (ref 70–99)
Glucose-Capillary: 204 mg/dL — ABNORMAL HIGH (ref 70–99)
Glucose-Capillary: 142 mg/dL — ABNORMAL HIGH (ref 70–99)
Glucose-Capillary: 165 mg/dL — ABNORMAL HIGH (ref 70–99)

## 2022-12-09 NOTE — Progress Notes (Signed)
NEUROSURGERY PROGRESS NOTE  Doing well.Complains of appropriate back pain but ambulating well. Walked the halls with therapy yesterday. Awaiting insurance approval for SNF placement  Temp:  [97.9 F (36.6 C)-98.5 F (36.9 C)] 98.1 F (36.7 C) (09/08 0902) Pulse Rate:  [74-87] 83 (09/08 0902) Resp:  [16-20] 20 (09/08 0902) BP: (119-150)/(54-80) 120/56 (09/08 0902) SpO2:  [94 %-99 %] 99 % (09/08 0902)    Sherryl Manges, NP 12/09/2022 9:29 AM

## 2022-12-09 NOTE — TOC Progression Note (Signed)
Neurosurgery NP aware of offer for peer-to-peer review for SNF and reports it will be completed tomorrow AM.   Review needs to be completed by 9/9 at 12pm. Number for review is [phone], option 5. Will need pt's name, DOB, and member ID ([id number]).   Dellie Burns, MSW, LCSW [phone] (coverage)

## 2022-12-09 NOTE — Plan of Care (Signed)

## 2022-12-09 NOTE — Plan of Care (Signed)

## 2022-12-09 NOTE — Plan of Care (Signed)
  Problem: Education: Goal: Knowledge of General Education information will improve Description: Including pain rating scale, medication(s)/side effects and non-pharmacologic comfort measures 12/09/2022 1857 by Avie Arenas, RN Outcome: Progressing 12/09/2022 1849 by Avie Arenas, RN Outcome: Progressing   Problem: Health Behavior/Discharge Planning: Goal: Ability to manage health-related needs will improve 12/09/2022 1857 by Avie Arenas, RN Outcome: Progressing 12/09/2022 1849 by Avie Arenas, RN Outcome: Progressing   Problem: Clinical Measurements: Goal: Respiratory complications will improve Outcome: Progressing Goal: Cardiovascular complication will be avoided Outcome: Progressing   Problem: Activity: Goal: Risk for activity intolerance will decrease Outcome: Progressing

## 2022-12-10 LAB — GLUCOSE, CAPILLARY
Glucose-Capillary: 170 mg/dL — ABNORMAL HIGH (ref 70–99)
Glucose-Capillary: 160 mg/dL — ABNORMAL HIGH (ref 70–99)

## 2022-12-10 MED ORDER — HYDROCODONE-ACETAMINOPHEN 5-325 MG PO TABS: 1.0000 | ORAL_TABLET | Freq: Three times a day (TID) | ORAL | 0 refills | Status: DC | PRN

## 2022-12-10 MED ORDER — CYCLOBENZAPRINE HCL 10 MG PO TABS: 10.0000 mg | ORAL_TABLET | Freq: Three times a day (TID) | ORAL | 0 refills | Status: DC | PRN

## 2022-12-10 MED ORDER — BISACODYL 10 MG RE SUPP
10.0000 mg | Freq: Once | RECTAL | Status: AC
  Administered 2022-12-10: 10 mg via RECTAL
  Filled 2022-12-10: qty 1

## 2022-12-10 NOTE — Progress Notes (Signed)
P2P done this morning and SNF has been approved, they will be sending over the approval auth codes.   Emilee Hero, PA-C 12/10/22 10:23 AM

## 2022-12-10 NOTE — TOC Transition Note (Signed)
Transition of Care [facility]) - CM/SW Discharge Note   Patient Details  Name: [name] MRN: [id number] Date of Birth: 1946-02-26  Transition of [facility] CM/SW Contact:  Baldemar Lenis, LCSW Phone Number: 12/10/2022, 12:10 PM   Clinical Narrative:   CSW updated that peer to peer completed and approved, received authorization details. CSW updated [facility], bed still available for patient. CSW met with patient to update, she was appreciative of update. Transport arranged with [facility] for after patient has a bowel movement, RN to update.  Nurse to call report to [phone], Room 160.    Final next level of care: Skilled Nursing Facility Barriers to Discharge: Barriers Resolved   Patient Goals and CMS Choice CMS Medicare.gov Compare Post Acute Care list provided to:: Patient Choice offered to / list presented to : Patient  Discharge Placement                Patient [facility] bed at: [facility] Patient to be transferred to facility by: [facility] Name of family member notified: Self Patient and family notified of of transfer: 12/10/22  Discharge Plan and Services Additional resources added to the After Visit Summary for       Post Acute Care Choice: Skilled Nursing Facility                               Social Determinants of Health (SDOH) Interventions SDOH Screenings   Food Insecurity: No Food Insecurity (12/05/2022)  Housing: Low Risk  (12/05/2022)  Transportation Needs: No Transportation Needs (12/05/2022)  Utilities: Not At Risk (12/05/2022)  Alcohol Screen: Low Risk  (10/21/2019)  Depression (PHQ2-9): Low Risk  (10/21/2019)  Financial Resource Strain: Low Risk  (10/21/2019)  Physical Activity: Insufficiently Active (10/21/2019)  Social Connections: Socially Integrated (10/21/2019)  Stress: No Stress Concern Present (10/21/2019)  Tobacco Use: High Risk (12/04/2022)     Readmission Risk Interventions     No data to display

## 2022-12-10 NOTE — Progress Notes (Signed)
Physical Therapy Treatment Patient Details Name: [name] MRN: [id number] DOB: 1946/02/19 Today's Date: 12/10/2022   History of Present Illness Pt is a 77 y/o female who presents s/p L4-S1 TLIF on 12/04/2022. PMH significant for CKD, COPD, DVT, DM, HTN, multinodular goiter, R knee surgery, neck surgery.    PT Comments  Pt seen for PT tx with pt agreeable, attempting to get dressed in recliner with PT providing assistance. Pt is able to recall 3/3 back precautions & verbalize when to wear LSO but pt sitting in recliner without LSO donned. PT provided assistance with donning LSO & performing lower body dressing. Pt is able to ambulate into hallway with RW & CGA but gait distance limited by pain & fatigue. Pt does tolerate standing at sink ~3 minutes to brush teeth but with progressively more trunk flexion with BUE support on sink despite PT encouraging more upright posture. Will continue to follow pt acutely to progress strengthening, endurance, transfers & gait with LRAD.   If plan is discharge home, recommend the following: A little help with bathing/dressing/bathroom;Assistance with cooking/housework;Assist for transportation;Help with stairs or ramp for entrance;A little help with walking and/or transfers   Can travel by private vehicle     Yes  Equipment Recommendations  None recommended by PT (defer to next venue)    Recommendations for Other Services       Precautions / Restrictions Precautions Precautions: Fall;Back Required Braces or Orthoses: Spinal Brace Spinal Brace: Lumbar corset;Applied in sitting position Restrictions Weight Bearing Restrictions: No     Mobility  Bed Mobility               General bed mobility comments: not tested, pt received & left sitting in recliner    Transfers Overall transfer level: Needs assistance Equipment used: Rolling walker (2 wheels) Transfers: Sit to/from Stand Sit to Stand: Min assist           General transfer  comment: STS from recliner with RW; pt with c/o increased pain during transfer, extra time to transition UE from recliner armrest to RW    Ambulation/Gait Ambulation/Gait assistance: Contact guard assist Gait Distance (Feet): 65 Feet Assistive device: Rolling walker (2 wheels) Gait Pattern/deviations: Decreased step length - left, Decreased step length - right, Decreased dorsiflexion - right, Decreased dorsiflexion - left, Decreased stride length Gait velocity: decreased     General Gait Details: decreased heel strike BLE despite cuing, decreased foot clearance BLE   Stairs             Wheelchair Mobility     Tilt Bed    Modified Rankin (Stroke Patients Only)       Balance Overall balance assessment: Needs assistance Sitting-balance support: Feet supported, No upper extremity supported, Single extremity supported Sitting balance-Leahy Scale: Fair     Standing balance support: During functional activity, No upper extremity supported, Reliant on assistive device for balance, Single extremity supported, Bilateral upper extremity supported Standing balance-Leahy Scale: Fair Standing balance comment: Pt able to stand at sink to brush teeth but requires BUE support                            Cognition Arousal: Alert Behavior During Therapy: [facility] for tasks assessed/performed Overall Cognitive Status: Within Functional Limits for tasks assessed  General Comments: Pt able to recall 3/3 back precautions but still requires cuing to maintain these during mobility & for proper use of LSO.        Exercises Other Exercises Other Exercises: Pt attempting to get dressed upon PT arrival, PT provided assistance for donning shirt & for pulling pants over hips. Pt unable to button pants in standing, even after requesting PT disconnect LSO to allow her easier access to buttons & PT provided assistance. Other Exercises: PT  educated pt on need to don LSO in sitting & assisted pt with this at beginning of session.    General Comments        Pertinent Vitals/Pain Pain Assessment Pain Assessment: 0-10 Pain Score: 7  Pain Location: back Pain Descriptors / Indicators: Discomfort, Grimacing, Guarding Pain Intervention(s): Monitored during session, Limited activity within patient's tolerance, Repositioned    Home Living                          Prior Function            PT Goals (current goals can now be found in the care plan section) Acute Rehab PT Goals Patient Stated Goal: decreased pain, get better PT Goal Formulation: With patient Time For Goal Achievement: 12/19/22 Potential to Achieve Goals: Good Progress towards PT goals: Progressing toward goals    Frequency    Min 1X/week      PT Plan      Co-evaluation              AM-PAC PT "6 Clicks" Mobility   Outcome Measure  Help needed turning from your back to your side while in a flat bed without using bedrails?: A Little Help needed moving from lying on your back to sitting on the side of a flat bed without using bedrails?: A Lot Help needed moving to and from a bed to a chair (including a wheelchair)?: A Little Help needed standing up from a chair using your arms (e.g., wheelchair or bedside chair)?: A Little Help needed to walk in hospital room?: A Little Help needed climbing 3-5 steps with a railing? : A Lot 6 Click Score: 16    End of Session Equipment Utilized During Treatment: Gait belt;Back brace Activity Tolerance: Patient tolerated treatment well;Patient limited by pain Patient left: in chair;with chair alarm set;with call bell/phone within reach Nurse Communication: Mobility status PT Visit Diagnosis: Pain;Muscle weakness (generalized) (M62.81);Difficulty in walking, not elsewhere classified (R26.2) Pain - part of body:  (back)     Time: 5784-6962 PT Time Calculation (min) (ACUTE ONLY): 28  min  Charges:    $Therapeutic Activity: 23-37 mins PT General Charges $$ ACUTE PT VISIT: 1 Visit                     Aleda Grana, PT, DPT 12/10/22, 2:51 PM   Sandi Mariscal 12/10/2022, 2:49 PM

## 2022-12-10 NOTE — Progress Notes (Signed)
Discharged via [facility] accompanied by husband.

## 2022-12-10 NOTE — Care Management Important Message (Signed)
Important Message  Patient Details  Name: [name] MRN: [id number] Date of Birth: 1945/05/15   Medicare Important Message Given:  Yes     Dorena Bodo 12/10/2022, 2:35 PM

## 2022-12-10 NOTE — Progress Notes (Signed)
Neurosurgery Service Progress Note  Subjective: No acute events overnight, no new complaints today  Objective: Vitals:   12/09/22 2019 12/10/22 0009 12/10/22 0347 12/10/22 0728  BP: (!) 109/52 (!) 114/54 (!) 122/58 116/68  Pulse: 82 80 78 79  Resp: 18 19 18 16   Temp: 98.1 F (36.7 C) 98 F (36.7 C) 98 F (36.7 C) 98.3 F (36.8 C)  TempSrc: Oral Oral Oral Oral  SpO2: 99% 100% 99% 99%  Weight:      Height:        Physical Exam: Strength 5/5 x4 and SILTx4, incision c/d/I  Assessment & Plan: 77 y.o. woman s/p open 4-5/5-1 lami / TLIF / PSF, recovering well.  -SNF pending, medically ready when bed available, P2P done today and accepted -DC summary done with DC meds included, paper scripts signed and in patient's chart  [name]  12/10/22 10:31 AM

## 2022-12-11 ENCOUNTER — Encounter: Payer: Self-pay | Admitting: Adult Health

## 2022-12-11 ENCOUNTER — Non-Acute Institutional Stay (SKILLED_NURSING_FACILITY): Payer: Medicare Other | Admitting: Adult Health

## 2022-12-11 DIAGNOSIS — E1122 Type 2 diabetes mellitus with diabetic chronic kidney disease: Secondary | ICD-10-CM

## 2022-12-11 DIAGNOSIS — I129 Hypertensive chronic kidney disease with stage 1 through stage 4 chronic kidney disease, or unspecified chronic kidney disease: Secondary | ICD-10-CM | POA: Diagnosis not present

## 2022-12-11 DIAGNOSIS — E1169 Type 2 diabetes mellitus with other specified complication: Secondary | ICD-10-CM

## 2022-12-11 DIAGNOSIS — E785 Hyperlipidemia, unspecified: Secondary | ICD-10-CM

## 2022-12-11 DIAGNOSIS — N2581 Secondary hyperparathyroidism of renal origin: Secondary | ICD-10-CM

## 2022-12-11 DIAGNOSIS — I700 Atherosclerosis of aorta: Secondary | ICD-10-CM

## 2022-12-11 DIAGNOSIS — K219 Gastro-esophageal reflux disease without esophagitis: Secondary | ICD-10-CM

## 2022-12-11 DIAGNOSIS — F5104 Psychophysiologic insomnia: Secondary | ICD-10-CM

## 2022-12-11 DIAGNOSIS — Z720 Tobacco use: Secondary | ICD-10-CM

## 2022-12-11 DIAGNOSIS — M4316 Spondylolisthesis, lumbar region: Secondary | ICD-10-CM

## 2022-12-11 DIAGNOSIS — E042 Nontoxic multinodular goiter: Secondary | ICD-10-CM | POA: Diagnosis not present

## 2022-12-11 DIAGNOSIS — N184 Chronic kidney disease, stage 4 (severe): Secondary | ICD-10-CM
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
## 2022-12-12 ENCOUNTER — Encounter: Payer: Self-pay | Admitting: Internal Medicine

## 2022-12-12 ENCOUNTER — Non-Acute Institutional Stay (SKILLED_NURSING_FACILITY): Payer: Medicare Other | Admitting: Internal Medicine

## 2022-12-12 ENCOUNTER — Other Ambulatory Visit: Payer: Self-pay | Admitting: Adult Health

## 2022-12-12 DIAGNOSIS — M4316 Spondylolisthesis, lumbar region: Secondary | ICD-10-CM

## 2022-12-12 DIAGNOSIS — E1122 Type 2 diabetes mellitus with diabetic chronic kidney disease: Secondary | ICD-10-CM

## 2022-12-12 DIAGNOSIS — Z720 Tobacco use: Secondary | ICD-10-CM

## 2022-12-12 DIAGNOSIS — E1351 Other specified diabetes mellitus with diabetic peripheral angiopathy without gangrene: Secondary | ICD-10-CM | POA: Diagnosis not present

## 2022-12-12 DIAGNOSIS — I129 Hypertensive chronic kidney disease with stage 1 through stage 4 chronic kidney disease, or unspecified chronic kidney disease: Secondary | ICD-10-CM | POA: Insufficient documentation

## 2022-12-12 DIAGNOSIS — I700 Atherosclerosis of aorta: Secondary | ICD-10-CM | POA: Insufficient documentation

## 2022-12-12 DIAGNOSIS — K219 Gastro-esophageal reflux disease without esophagitis: Secondary | ICD-10-CM | POA: Insufficient documentation

## 2022-12-12 DIAGNOSIS — F5104 Psychophysiologic insomnia: Secondary | ICD-10-CM | POA: Insufficient documentation

## 2022-12-12 DIAGNOSIS — E1169 Type 2 diabetes mellitus with other specified complication: Secondary | ICD-10-CM | POA: Insufficient documentation

## 2022-12-12 DIAGNOSIS — N184 Chronic kidney disease, stage 4 (severe): Secondary | ICD-10-CM

## 2022-12-12 DIAGNOSIS — N2581 Secondary hyperparathyroidism of renal origin: Secondary | ICD-10-CM

## 2022-12-12 MED ORDER — TRAMADOL HCL 50 MG PO TABS
25.0000 mg | ORAL_TABLET | Freq: Two times a day (BID) | ORAL | 0 refills | Status: DC
Start: 2022-12-12 — End: 2022-12-21

## 2022-12-12 MED ORDER — ZOLPIDEM TARTRATE 10 MG PO TABS: 10.0000 mg | ORAL_TABLET | Freq: Every day | ORAL | 0 refills | Status: DC

## 2022-12-12 MED ORDER — HYDROCODONE-ACETAMINOPHEN 5-325 MG PO TABS: 1.0000 | ORAL_TABLET | Freq: Three times a day (TID) | ORAL | 0 refills | Status: DC | PRN

## 2022-12-12 NOTE — Assessment & Plan Note (Addendum)
Current Ca++ is WNL @ 9.8. No iPTH in Epic. Verification of secondary hyperparathyroidism status with [facility].

## 2022-12-12 NOTE — Progress Notes (Signed)
NURSING HOME LOCATION:  [facility] ROOM NUMBER:  [id number] P  CODE STATUS: Full Code    PCP:  Assunta Found MD  This is a comprehensive admission note to this SNFperformed on this date less than 30 days from date of admission. Included are preadmission medical/surgical history; reconciled medication list; family history; social history and comprehensive review of systems.  Corrections and additions to the records were documented. Comprehensive physical exam was also performed. Additionally a clinical summary was entered for each active diagnosis pertinent to this admission in the Problem List to enhance continuity of care.  HPI: She was hospitalized 9/3 - 12/10/2022, admitted following an uncomplicated open transforaminal lumbar interbody fusion with pedicle screw fixation  @ 4-5/5-1 by Dr. Maurice Small for lumbar spondylolisthesis with lumbar radiculopathy.. Labs revealed advanced CKD with initial creatinine 1.91 and GFR of 26.  Final values were 1.78/29 indicating high stage IV CKD.  There was slight drop in H/H from values of 12.5/40.4 to 11.7/36.9.  PTA A1c had been 8.9% on 8/26.  While hospitalized glucoses ranged from 136 up to 289. There were no complications; PT/OT recommended SNF placement for rehab. Neurosurgical follow-up was to be 2 weeks post discharge.  Past medical and surgical history: Includes COPD, history of DVT, diabetes with CKD, GERD, history of gout, history of homocystinemia, essential hypertension, multinodular goiter, and right renal atrophy. Surgeries and procedures include abdominal hysterectomy, colonoscopy with polypectomy, EGD, cervical spine surgery, and knee surgery.  Social history: Rare alcohol intake. Active smoker with 1/2 ppd PTA & > 35 pack years of smoking.  Family history: Strong FH of DM.   Review of systems: She describes pain in her back and sides "like muscle pain."  Pain can be up to a "12 on a 10 scale."  She denies stool or urinary  incontinence. She states that she was checking her sugars at home once or twice a day and fasting blood sugar ranged from 110 to 170.  She validates  excessive thirst and excessive urination. She describes exertional dyspnea and intermittent cough and sputum production.  She also describes occasional paroxysmal nocturnal dyspnea. She has dysphagia if she eats too quickly.  She also validates dyspepsia.  She is experiencing constipation in the context of opioid administration with decreased bowel movement volume. She has cold intolerance. She was under the impression that her end-stage kidney disease was related to Celebrex.  She is followed by [facility] in [place], [place].  As noted PMH in the chart includes right renal atrophy.  Constitutional: No fever, significant weight change  Eyes: No redness, discharge, pain, vision change ENT/mouth: No nasal congestion, purulent discharge, earache, change in hearing, sore throat  Cardiovascular: No chest pain, palpitations,  claudication, edema  Respiratory: No hemoptysis,  significant snoring, apnea  Gastrointestinal: No abdominal pain, nausea /vomiting, rectal bleeding, melena Genitourinary: No dysuria, hematuria, pyuria, incontinence, nocturia Dermatologic: No rash, pruritus, change in appearance of skin Neurologic: No dizziness, headache, syncope, seizures, numbness, tingling Psychiatric: No significant anxiety, depression, insomnia, anorexia Endocrine: No change in hair/skin/nails, excessive hunger Hematologic/lymphatic: No significant bruising, lymphadenopathy, abnormal bleeding Allergy/immunology: No itchy/watery eyes, significant sneezing, urticaria, angioedema  Physical exam:  Pertinent or positive findings: She has bilateral ptosis.  Sclera are muddy.  Lacrimal glands are prominent.  The lower lids are puffy.  A faint right carotid bruit is suggested on exam.  Heart sounds are distant.  There is a slight increase in S2.  She  is wearing an abdominal binder.  Pedal pulses are decreased.  General appearance: Adequately nourished; no acute distress, increased work of breathing is present.   Lymphatic: No lymphadenopathy about the head, neck, axilla. Eyes: No conjunctival inflammation or lid edema is present. There is no scleral icterus. Ears:  External ear exam shows no significant lesions or deformities.   Nose:  External nasal examination shows no deformity or inflammation. Nasal mucosa are pink and moist without lesions, exudates Oral exam: Lips and gums are healthy appearing.There is no oropharyngeal erythema or exudate. Neck:  No thyromegaly, masses, tenderness noted.    Heart:  No gallop, murmur, click, rub.  Lungs:  without wheezes, rhonchi, rales, rubs. Abdomen: exam limited by binder but bowel sounds normal. GU: Deferred  Extremities:  No cyanosis, clubbing, edema. Neurologic exam:  Balance, Rhomberg, finger to nose testing could not be completed due to clinical state Skin: Warm & dry w/o tenting. No significant lesions or rash.  See clinical summary under each active problem in the Problem List with associated updated therapeutic plan

## 2022-12-12 NOTE — Patient Instructions (Signed)
See assessment and plan under each diagnosis in the problem list and acutely for this visit 

## 2022-12-12 NOTE — Assessment & Plan Note (Addendum)
Clarification needed by PCP as to etiology of CKD as she believes it was related to Celebrex.  I did discuss this with her and stated that if this were related to that medication both kidneys should have been affected to the same degree of severity.

## 2022-12-12 NOTE — Assessment & Plan Note (Signed)
PT/OT @ SNF as tolerated . NS F/U 2 weeks post op.

## 2022-12-12 NOTE — Assessment & Plan Note (Deleted)
12/04/22 1.78/29 , high Stage 4 CKD

## 2022-12-12 NOTE — Assessment & Plan Note (Signed)
Metformin will be decreased to 500 mg every day. BP controlled; no change in antihypertensive medications

## 2022-12-15 NOTE — Assessment & Plan Note (Addendum)
Apparently she continues to smoke a half a pack a day.  NicoDerm patch has been ordered while here at the SNF.  I discussed the risk of neurovascular & cardiovascular complications with her.  I also discussed the spirometry report from 02/21/2021 which revealed an FEV/FVC of 63%.

## 2022-12-17 ENCOUNTER — Non-Acute Institutional Stay (SKILLED_NURSING_FACILITY): Payer: Medicare Other | Admitting: Student

## 2022-12-17 ENCOUNTER — Encounter: Payer: Self-pay | Admitting: Adult Health

## 2022-12-17 ENCOUNTER — Encounter: Payer: Self-pay | Admitting: Student

## 2022-12-17 DIAGNOSIS — M1712 Unilateral primary osteoarthritis, left knee: Secondary | ICD-10-CM

## 2022-12-17 DIAGNOSIS — M4316 Spondylolisthesis, lumbar region: Secondary | ICD-10-CM
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
## 2022-12-20 ENCOUNTER — Non-Acute Institutional Stay (SKILLED_NURSING_FACILITY): Payer: Medicare Other | Admitting: Adult Health

## 2022-12-20 ENCOUNTER — Encounter: Payer: Self-pay | Admitting: Adult Health

## 2022-12-20 DIAGNOSIS — I129 Hypertensive chronic kidney disease with stage 1 through stage 4 chronic kidney disease, or unspecified chronic kidney disease: Secondary | ICD-10-CM | POA: Diagnosis not present

## 2022-12-20 DIAGNOSIS — M4316 Spondylolisthesis, lumbar region: Secondary | ICD-10-CM | POA: Diagnosis not present

## 2022-12-20 DIAGNOSIS — E1122 Type 2 diabetes mellitus with diabetic chronic kidney disease: Secondary | ICD-10-CM | POA: Diagnosis not present

## 2022-12-20 DIAGNOSIS — I700 Atherosclerosis of aorta: Secondary | ICD-10-CM

## 2022-12-20 DIAGNOSIS — N184 Chronic kidney disease, stage 4 (severe): Secondary | ICD-10-CM
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
## 2022-12-21 ENCOUNTER — Other Ambulatory Visit: Payer: Self-pay | Admitting: Adult Health

## 2022-12-21 MED ORDER — JARDIANCE 10 MG PO TABS: 10.0000 mg | ORAL_TABLET | Freq: Every morning | ORAL | 0 refills | Status: DC

## 2022-12-21 MED ORDER — PANTOPRAZOLE SODIUM 40 MG PO TBEC: 40.0000 mg | DELAYED_RELEASE_TABLET | Freq: Every day | ORAL | 0 refills | Status: AC

## 2022-12-21 MED ORDER — ALBUTEROL SULFATE HFA 108 (90 BASE) MCG/ACT IN AERS: 1.0000 | INHALATION_SPRAY | Freq: Four times a day (QID) | RESPIRATORY_TRACT | 0 refills | Status: DC | PRN

## 2022-12-21 MED ORDER — BISOPROLOL FUMARATE 10 MG PO TABS: 10.0000 mg | ORAL_TABLET | Freq: Every day | ORAL | 0 refills | Status: AC

## 2022-12-21 MED ORDER — SIMVASTATIN 20 MG PO TABS: 20.0000 mg | ORAL_TABLET | Freq: Every day | ORAL | 0 refills | Status: AC

## 2022-12-21 MED ORDER — LINAGLIPTIN 5 MG PO TABS: 5.0000 mg | ORAL_TABLET | Freq: Every day | ORAL | 0 refills | Status: DC

## 2022-12-21 MED ORDER — HYDROCODONE-ACETAMINOPHEN 5-325 MG PO TABS: 1.0000 | ORAL_TABLET | Freq: Three times a day (TID) | ORAL | 0 refills | Status: DC | PRN

## 2022-12-21 MED ORDER — CYCLOBENZAPRINE HCL 10 MG PO TABS: 10.0000 mg | ORAL_TABLET | Freq: Three times a day (TID) | ORAL | 0 refills | Status: DC | PRN

## 2022-12-21 MED ORDER — ZOLPIDEM TARTRATE 10 MG PO TABS: 10.0000 mg | ORAL_TABLET | Freq: Every day | ORAL | 0 refills | Status: DC

## 2022-12-21 MED ORDER — NIFEDIPINE ER OSMOTIC RELEASE 90 MG PO TB24: 90.0000 mg | ORAL_TABLET | Freq: Every day | ORAL | 0 refills | Status: AC

## 2022-12-31 NOTE — Addendum Note (Signed)
Addended by: Sharee Holster on: 12/31/2022 10:34 AM   Modules accepted: Level of Service

## 2023-01-21 DIAGNOSIS — D631 Anemia in chronic kidney disease: Secondary | ICD-10-CM | POA: Insufficient documentation

## 2023-04-26 ENCOUNTER — Other Ambulatory Visit (HOSPITAL_COMMUNITY): Payer: Self-pay | Admitting: Family Medicine

## 2023-04-26 DIAGNOSIS — E041 Nontoxic single thyroid nodule: Secondary | ICD-10-CM

## 2023-05-02 ENCOUNTER — Ambulatory Visit (HOSPITAL_COMMUNITY)
Admission: RE | Admit: 2023-05-02 | Discharge: 2023-05-02 | Disposition: A | Discharge status: Home / Self Care | Payer: Medicare Other | Source: Ambulatory Visit | Attending: Family Medicine | Admitting: Family Medicine

## 2023-05-02 DIAGNOSIS — E041 Nontoxic single thyroid nodule: Secondary | ICD-10-CM | POA: Diagnosis present

## 2023-09-06 ENCOUNTER — Other Ambulatory Visit (HOSPITAL_COMMUNITY): Payer: Self-pay | Admitting: Family Medicine

## 2023-09-06 DIAGNOSIS — Z1231 Encounter for screening mammogram for malignant neoplasm of breast: Secondary | ICD-10-CM

## 2023-09-13 ENCOUNTER — Ambulatory Visit (HOSPITAL_COMMUNITY)
Admission: RE | Admit: 2023-09-13 | Discharge: 2023-09-13 | Disposition: A | Discharge status: Home / Self Care | Source: Ambulatory Visit | Attending: Family Medicine | Admitting: Family Medicine

## 2023-09-13 DIAGNOSIS — Z1231 Encounter for screening mammogram for malignant neoplasm of breast: Secondary | ICD-10-CM | POA: Diagnosis present

## 2023-09-30 ENCOUNTER — Emergency Department (HOSPITAL_COMMUNITY): Admission: EM | Admit: 2023-09-30 | Discharge: 2023-09-30 | Disposition: A | Discharge status: Home / Self Care

## 2023-09-30 ENCOUNTER — Encounter (HOSPITAL_COMMUNITY): Payer: Self-pay

## 2023-09-30 ENCOUNTER — Emergency Department (HOSPITAL_COMMUNITY)

## 2023-09-30 ENCOUNTER — Other Ambulatory Visit: Payer: Self-pay

## 2023-09-30 DIAGNOSIS — I82412 Acute embolism and thrombosis of left femoral vein: Secondary | ICD-10-CM | POA: Diagnosis not present

## 2023-09-30 DIAGNOSIS — N184 Chronic kidney disease, stage 4 (severe): Secondary | ICD-10-CM | POA: Diagnosis not present

## 2023-09-30 DIAGNOSIS — Z7982 Long term (current) use of aspirin: Secondary | ICD-10-CM | POA: Diagnosis not present

## 2023-09-30 DIAGNOSIS — M7989 Other specified soft tissue disorders: Secondary | ICD-10-CM | POA: Diagnosis present

## 2023-09-30 LAB — COMPREHENSIVE METABOLIC PANEL WITH GFR
ALT: 9 U/L (ref 0–44)
AST: 13 U/L — ABNORMAL LOW (ref 15–41)
Albumin: 3.4 g/dL — ABNORMAL LOW (ref 3.5–5.0)
Alkaline Phosphatase: 106 U/L (ref 38–126)
Anion gap: 10 (ref 5–15)
BUN: 34 mg/dL — ABNORMAL HIGH (ref 8–23)
CO2: 24 mmol/L (ref 22–32)
Calcium: 9.8 mg/dL (ref 8.9–10.3)
Chloride: 103 mmol/L (ref 98–111)
Creatinine, Ser: 2.38 mg/dL — ABNORMAL HIGH (ref 0.44–1.00)
GFR, Estimated: 20 mL/min — ABNORMAL LOW (ref >60)
Glucose, Bld: 115 mg/dL — ABNORMAL HIGH (ref 70–99)
Potassium: 5.4 mmol/L — ABNORMAL HIGH (ref 3.5–5.1)
Sodium: 137 mmol/L (ref 135–145)
Total Bilirubin: 0.4 mg/dL (ref 0.0–1.2)
Total Protein: 7.8 g/dL (ref 6.5–8.1)

## 2023-09-30 LAB — CBC WITH DIFFERENTIAL/PLATELET
Abs Immature Granulocytes: 0.05 10*3/uL (ref 0.00–0.07)
Basophils Absolute: 0 10*3/uL (ref 0.0–0.1)
Basophils Relative: 1 %
Eosinophils Absolute: 0.2 10*3/uL (ref 0.0–0.5)
Eosinophils Relative: 2 %
HCT: 38.2 % (ref 36.0–46.0)
Hemoglobin: 11.9 g/dL — ABNORMAL LOW (ref 12.0–15.0)
Immature Granulocytes: 1 %
Lymphocytes Relative: 23 %
Lymphs Abs: 1.7 10*3/uL (ref 0.7–4.0)
MCH: 28.5 pg (ref 26.0–34.0)
MCHC: 31.2 g/dL (ref 30.0–36.0)
MCV: 91.4 fL (ref 80.0–100.0)
Monocytes Absolute: 1.1 10*3/uL — ABNORMAL HIGH (ref 0.1–1.0)
Monocytes Relative: 16 %
Neutro Abs: 4.2 10*3/uL (ref 1.7–7.7)
Neutrophils Relative %: 57 %
Platelets: 225 10*3/uL (ref 150–400)
RBC: 4.18 MIL/uL (ref 3.87–5.11)
RDW: 16.3 % — ABNORMAL HIGH (ref 11.5–15.5)
WBC: 7.2 10*3/uL (ref 4.0–10.5)
nRBC: 0 % (ref 0.0–0.2)

## 2023-09-30 LAB — D-DIMER, QUANTITATIVE: D-Dimer, Quant: 7.07 ug{FEU}/mL — ABNORMAL HIGH (ref 0.00–0.50)

## 2023-09-30 MED ORDER — APIXABAN (ELIQUIS) VTE STARTER PACK (10MG AND 5MG): ORAL_TABLET | ORAL | 0 refills | Status: DC

## 2023-09-30 MED ORDER — ENOXAPARIN SODIUM 80 MG/0.8ML IJ SOSY
1.0000 mg/kg | PREFILLED_SYRINGE | Freq: Once | INTRAMUSCULAR | Status: AC
  Administered 2023-09-30: 82.5 mg via SUBCUTANEOUS
  Filled 2023-09-30: qty 1.6

## 2023-09-30 NOTE — ED Notes (Signed)
 Patient transported to Ultrasound

## 2023-09-30 NOTE — ED Triage Notes (Signed)
 Pt arrived via POV from home after speaking with her PCP for concerns of swelling in her LLE. Pt reports first noticing swelling Saturday this past weekend. Pt reports previous DVT in RLE. Pt reports her leg is very tight. Pt denies taking blood thinners at this time. Pt ambulatory in Triage.

## 2023-09-30 NOTE — Discharge Instructions (Addendum)
 Take your Eliquis as prescribed starting tomorrow.  The DVT clinic phone number is listed in the provider name and number is listed but they will call you for a follow-up appointment for later this week.  Return to the ER for any new or worsening symptoms.  Stay on your other medications as prescribed.

## 2023-09-30 NOTE — ED Provider Notes (Signed)
 [place] EMERGENCY DEPARTMENT AT [facility] Provider Note   CSN: [id number] Arrival date & time: 09/30/23  1659     Patient presents with: Leg Swelling   [name] is a 78 y.o. female.   78 year old female presents for evaluation of left lower extremity swelling.  She was sent here by her PCP to rule out a DVT.  She has a history of right-sided DVT in the past but she is no longer on blood thinners.  She states the leg started swelling couple days ago has become more sore and she feels like the swelling has moved up to her lower thigh and behind her knee.  Denies any falls or injury.  Denies any other symptoms or concerns at this time.        Prior to Admission medications   Medication Sig Start Date End Date Taking? Authorizing Provider  APIXABAN (ELIQUIS) VTE STARTER PACK (10MG  AND 5MG ) Take as directed on package: start with two-5mg  tablets twice daily for 7 days. On day 8, switch to one-5mg  tablet twice daily. 09/30/23  Yes Ziair Penson L, DO  ACCU-CHEK GUIDE test strip TESTING TWICE DAILYT 01/13/21   [provider]  Accu-Chek Softclix Lancets lancets 2 (two) times daily. 01/13/21   [provider]  acetaminophen  (TYLENOL ) 500 MG tablet Take 1,000 mg by mouth every 8 (eight) hours as needed for moderate pain.    [provider]  albuterol  (VENTOLIN  HFA) 108 (90 Base) MCG/ACT inhaler Inhale 1-2 puffs into the lungs every 6 (six) hours as needed for wheezing or shortness of breath. 12/21/22   Landy Barnie GORMAN, NP  aspirin  81 MG tablet Take 81 mg by mouth every evening.     [provider]  bisoprolol  (ZEBETA ) 10 MG tablet Take 1 tablet (10 mg total) by mouth daily. 12/21/22   Landy Barnie GORMAN, NP  Cholecalciferol (VITAMIN D3) 125 MCG (5000 UT) CAPS Take 5,000 Units by mouth daily.    [provider]  cyclobenzaprine  (FLEXERIL ) 10 MG tablet Take 1 tablet (10 mg total) by mouth 3 (three) times daily as needed for muscle  spasms. 12/21/22   Landy Barnie GORMAN, NP  HYDROcodone -acetaminophen  (NORCO/VICODIN) 5-325 MG tablet Take 1 tablet by mouth every 8 (eight) hours as needed (pain). 12/21/22   Landy Barnie GORMAN, NP  JARDIANCE  10 MG TABS tablet Take 1 tablet (10 mg total) by mouth every morning. 12/21/22   Landy Barnie GORMAN, NP  linagliptin  (TRADJENTA ) 5 MG TABS tablet Take 1 tablet (5 mg total) by mouth daily. 12/21/22   Landy Barnie GORMAN, NP  nicotine  (NICODERM CQ  - DOSED IN MG/24 HOURS) 14 mg/24hr patch Place 14 mg onto the skin daily.    [provider]  NIFEdipine  (PROCARDIA  XL/NIFEDICAL-XL) 90 MG 24 hr tablet Take 1 tablet (90 mg total) by mouth daily. 12/21/22   Landy Barnie GORMAN, NP  Omega-3 Fatty Acids  (FISH OIL) 1200 MG CAPS Take 1,200 mg by mouth daily.    [provider]  pantoprazole  (PROTONIX ) 40 MG tablet Take 1 tablet (40 mg total) by mouth daily. 12/21/22   Landy Barnie GORMAN, NP  simvastatin  (ZOCOR ) 20 MG tablet Take 1 tablet (20 mg total) by mouth daily at 6 PM. 12/21/22   Landy, Barnie GORMAN, NP  zolpidem  (AMBIEN ) 10 MG tablet Take 1 tablet (10 mg total) by mouth at bedtime. 12/21/22   Landy Barnie GORMAN, NP    Allergies: Ace inhibitors, Celecoxib, Hydrocodone -acetaminophen , and Zofran  [ondansetron  hcl]  Review of Systems  Constitutional:  Negative for chills and fever.  HENT:  Negative for ear pain and sore throat.   Eyes:  Negative for pain and visual disturbance.  Respiratory:  Negative for cough and shortness of breath.   Cardiovascular:  Negative for chest pain and palpitations.  Gastrointestinal:  Negative for abdominal pain and vomiting.  Genitourinary:  Negative for dysuria and hematuria.  Musculoskeletal:  Negative for arthralgias and back pain.       Admits left lower leg swelling  Skin:  Negative for color change and rash.  Neurological:  Negative for seizures and syncope.  All other systems reviewed and are negative.   Updated Vital Signs BP (!) 178/78   Pulse 75   Temp  97.6 F (36.4 C)   Resp 16   Ht 5' 10 (1.778 m)   Wt 81.9 kg   LMP  (LMP Unknown)   SpO2 97%   BMI 25.91 kg/m   Physical Exam Vitals and nursing note reviewed.  Constitutional:      General: She is not in acute distress.    Appearance: She is well-developed. She is not ill-appearing.  HENT:     Head: Normocephalic and atraumatic.   Eyes:     Conjunctiva/sclera: Conjunctivae normal.    Cardiovascular:     Rate and Rhythm: Normal rate and regular rhythm.     Heart sounds: Normal heart sounds. No murmur heard.    No friction rub.  Pulmonary:     Effort: Pulmonary effort is normal. No respiratory distress.     Breath sounds: Normal breath sounds.  Abdominal:     Palpations: Abdomen is soft.     Tenderness: There is no abdominal tenderness.   Musculoskeletal:        General: Swelling present. Normal range of motion.     Cervical back: Neck supple.     Comments: There is swelling diffusely to the left lower extremity up to behind the knee with some mild tenderness to palpation of the calf, right lower extremity has no swelling   Skin:    General: Skin is warm and dry.     Capillary Refill: Capillary refill takes less than 2 seconds.   Neurological:     General: No focal deficit present.     Mental Status: She is alert.   Psychiatric:        Mood and Affect: Mood normal.     (all labs ordered are listed, but only abnormal results are displayed) Labs Reviewed  CBC WITH DIFFERENTIAL/PLATELET - Abnormal; Notable for the following components:      Result Value   Hemoglobin 11.9 (*)    RDW 16.3 (*)    Monocytes Absolute 1.1 (*)    All other components within normal limits  COMPREHENSIVE METABOLIC PANEL WITH GFR - Abnormal; Notable for the following components:   Potassium 5.4 (*)    Glucose, Bld 115 (*)    BUN 34 (*)    Creatinine, Ser 2.38 (*)    Albumin 3.4 (*)    AST 13 (*)    GFR, Estimated 20 (*)    All other components within normal limits  D-DIMER,  QUANTITATIVE - Abnormal; Notable for the following components:   D-Dimer, Quant 7.07 (*)    All other components within normal limits    EKG: None  Radiology: US  Venous Img Lower Unilateral Left Result Date: 09/30/2023 CLINICAL DATA:  Left leg swelling for 3 days EXAM: Left LOWER EXTREMITY VENOUS DOPPLER  ULTRASOUND TECHNIQUE: Gray-scale sonography with compression, as well as color and duplex ultrasound, were performed to evaluate the deep venous system(s) from the level of the common femoral vein through the popliteal and proximal calf veins. COMPARISON:  None Available. FINDINGS: VENOUS Occlusive noncompressible thrombus throughout the left lower extremity extending from the common femoral vein into the profunda femoral, femoral, popliteal, posterior tibial, and peroneal veins. Limited views of the contralateral common femoral vein are unremarkable. OTHER None. Limitations: none IMPRESSION: Extensive DVT throughout the left leg. Critical Value/emergent results were called by telephone at the time of interpretation on 09/30/2023 at 7:58 pm to provider [facility] , who verbally acknowledged these results. Electronically Signed   By: Norman Gatlin M.D.   On: 09/30/2023 20:03     Procedures   Medications Ordered in the ED  enoxaparin  (LOVENOX ) injection 82.5 mg (82.5 mg Subcutaneous Given 09/30/23 2119)                                    Medical Decision Making Medical Decision Making Nursing notes are reviewed. Differential diagnosis for this patient would include but not limited to: DVT, cellulitis, edema, Baker's cyst, other  Records reviewed: Outpatient records reviewed and patient follows with nephrology for stage IV CKD   Consults: Dr. Alonzo surgery-I spoke with him on the phone and he recommended anticoagulation, CT venogram and follow-up in the office.  A referral was placed.    Studies: DVT study interpreted independently) patient with left lower extremity DVT,  this was additionally discussed with radiology and patient has quite an extensive DVT of the heart, femoral  Emergency Department Course:  Vital signs and pulse oximetry are reviewed, evaluated by myself and found to be within normal limits prior to final disposition. Findings of laboratory testing and medical imaging are discussed with patient and family that is available. Patient agrees with the medical care plan as follows:  Patient's lab workup reviewed by me her D-dimer is elevated.  DVT study as above.  Vascular surgery consult as above. CT venogram was not obtained, as patient has CKD stage IV and I discussed the risks of the venogram with her and she does not want to obtain at this time.  I also gave her a shot of Lovenox  here and will treat her with Eliquis over Xarelto due to her chronic kidney disease.  A referral was placed for vascular surgery DVT clinic.  Patient advised to call office if she does not hear from them, take her Eliquis as prescribed and stay on her other medications as prescribed.  Advise close follow with her nephrologist and primary care doctor as well and otherwise return to the ER for any worsening symptoms.  She feels comfortable being discharged home.  Problems Addressed: Acute deep vein thrombosis (DVT) of femoral vein of left lower extremity (HCC): undiagnosed new problem with uncertain prognosis  Amount and/or Complexity of Data Reviewed External Data Reviewed: notes. Labs: ordered. Decision-making details documented in ED Course. Radiology: ordered and independent interpretation performed. Decision-making details documented in ED Course.  Risk OTC drugs. Prescription drug management.     Final diagnoses:  Acute deep vein thrombosis (DVT) of femoral vein of left lower extremity [facility])    ED Discharge Orders          Ordered    AMB Referral to Deep Vein Thrombosis Clinic        09/30/23 2119  APIXABAN (ELIQUIS) VTE STARTER PACK (10MG  AND 5MG )        Note to Pharmacy: If starter pack unavailable, substitute with seventy-four 5 mg apixaban tabs following the above SIG directions.   09/30/23 2119               Gennaro Duwaine CROME, DO 09/30/23 2210

## 2023-10-01 ENCOUNTER — Telehealth (HOSPITAL_COMMUNITY): Payer: Self-pay | Admitting: Pharmacy Technician

## 2023-10-01 ENCOUNTER — Other Ambulatory Visit (HOSPITAL_COMMUNITY): Payer: Self-pay

## 2023-10-01 ENCOUNTER — Telehealth: Payer: Self-pay | Admitting: Student-PharmD
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
## 2023-10-01 NOTE — Telephone Encounter (Signed)
 Patient Product/process development scientist completed.    The patient is insured through [facility]. Patient has [employer], may use a copay card, and/or apply for patient assistance if available.    Ran test claim for Eliquis 5 mg and the current 30 day co-pay is $176.39 due to a deductible.   This test claim was processed through [facility]- copay amounts may vary at other pharmacies due to pharmacy/plan contracts, or as the patient moves through the different stages of their insurance plan.     Reyes Sharps, CPHT Pharmacy Technician III Certified Patient Advocate [facility] Patient Advocate Team Direct Number: [phone]  Fax: [phone]

## 2023-10-01 NOTE — Telephone Encounter (Signed)
 Patient was referred to DVT Clinic after DVT diagnosis in the ED yesterday. She reported that she went to go pick up her Eliquis starter pack at her pharmacy [facility]) this morning and they told her that they didn't have it in stock and it would be tomorrow before they had it for her. The Rx indicates that if the starter pack is unavailable, they should dispense 74 tablets with the same starter pack instructions. I called the pharmacy who confirmed they can see these instructions and they could provide no clarification as to why they weren't filling it this way for the patient. They understand the importance of her starting this medication immediately for an acute DVT. Confirmed that they would fill this for her today so she can start taking the Eliquis this morning. Patient has been provided with copay cards to reduce costs. Called patient back to let her know the pharmacy would have it ready today and to give me a call back if there were any more issues.

## 2023-10-02 ENCOUNTER — Ambulatory Visit: Admitting: Student-PharmD

## 2023-10-03 ENCOUNTER — Other Ambulatory Visit (HOSPITAL_COMMUNITY): Payer: Self-pay

## 2023-10-03 ENCOUNTER — Ambulatory Visit: Attending: Vascular Surgery | Admitting: Student-PharmD

## 2023-10-03 ENCOUNTER — Encounter: Payer: Self-pay | Admitting: Student-PharmD

## 2023-10-03 VITALS — BP 167/81 | HR 70 | Wt 177.6 lb

## 2023-10-03 DIAGNOSIS — I82412 Acute embolism and thrombosis of left femoral vein: Secondary | ICD-10-CM | POA: Diagnosis not present

## 2023-10-03 DIAGNOSIS — M7989 Other specified soft tissue disorders: Secondary | ICD-10-CM

## 2023-10-03 MED ORDER — APIXABAN 5 MG PO TABS: 5.0000 mg | ORAL_TABLET | Freq: Two times a day (BID) | ORAL | 5 refills | Status: DC

## 2023-10-03 NOTE — Patient Instructions (Addendum)
-  Continue apixaban (Eliquis) 10 mg twice daily for 7 days followed by 5 mg twice daily. -We're having you come back next week so we can see how your symptoms are improving and if we need to consider further imaging or intervention. In the meantime, wear your compression stocking daily and elevate your legs (feet above heart). -Your refills have been sent to [facility]. You may need to call the pharmacy to ask them to fill this when you start to run low on your current supply. Be sure that they run the Eliquis through your commercial insurance like it appears they are running your Jardiance . That way, you can use the copay card to reduce the cost to $10. -It is important to take your medication around the same time every day.  -Avoid NSAIDs like ibuprofen  (Advil , Motrin ) and naproxen (Aleve) as well as aspirin  doses over 100 mg daily. -Tylenol  (acetaminophen ) is the preferred over the counter pain medication to lower the risk of bleeding. -Be sure to alert all of your health care providers that you are taking an anticoagulant prior to starting a new medication or having a procedure. -Monitor for signs and symptoms of bleeding (abnormal bruising, prolonged bleeding, nose bleeds, bleeding from gums, discolored urine, black tarry stools). If you have fallen and hit your head OR if your bleeding is severe or not stopping, seek emergency care.  -Go to the emergency room if emergent signs and symptoms of new clot occur (new or worse swelling and pain in an arm or leg, shortness of breath, chest pain, fast or irregular heartbeats, lightheadedness, dizziness, fainting, coughing up blood) or if you experience a significant color change (pale or blue) in the extremity that has the DVT.  -We recommend you wear compression stockings (20-30 mmHg) as long as you are having swelling or pain. Be sure to purchase the correct size and take them off at night.   If you have any questions or need to reschedule an  appointment, please call [phone]. If you are having an emergency, call 911 or present to the nearest emergency room.   What is a DVT?  -Deep vein thrombosis (DVT) is a condition in which a blood clot forms in a vein of the deep venous system which can occur in the lower leg, thigh, pelvis, arm, or neck. This condition is serious and can be life-threatening if the clot travels to the arteries of the lungs and causing a blockage (pulmonary embolism, PE). A DVT can also damage veins in the leg, which can lead to long-term venous disease, leg pain, swelling, discoloration, and ulcers or sores (post-thrombotic syndrome).  -Treatment may include taking an anticoagulant medication to prevent more clots from forming and the current clot from growing, wearing compression stockings, and/or surgical procedures to remove or dissolve the clot.

## 2023-10-08 LAB — LAB REPORT - SCANNED: PTH, Intact: 121

## 2023-10-10 ENCOUNTER — Ambulatory Visit: Attending: Vascular Surgery | Admitting: Student-PharmD

## 2023-10-10 ENCOUNTER — Encounter: Payer: Self-pay | Admitting: Student-PharmD

## 2023-10-10 VITALS — BP 153/79 | HR 76 | Wt 177.6 lb

## 2023-10-10 DIAGNOSIS — M7989 Other specified soft tissue disorders: Secondary | ICD-10-CM

## 2023-10-10 DIAGNOSIS — I82412 Acute embolism and thrombosis of left femoral vein: Secondary | ICD-10-CM | POA: Diagnosis not present
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
## 2023-10-10 NOTE — Patient Instructions (Signed)
-  Continue apixaban  (Eliquis ) 5 mg twice daily. -Your refills have been sent to [facility] pharmacy. You may need to call the pharmacy to ask them to fill this when you start to run low on your current supply.  -It is important to take your medication around the same time every day.  -Avoid NSAIDs like ibuprofen  (Advil , Motrin ) and naproxen (Aleve) as well as aspirin  doses over 100 mg daily. -Tylenol  (acetaminophen ) is the preferred over the counter pain medication to lower the risk of bleeding. -Be sure to alert all of your health care providers that you are taking an anticoagulant prior to starting a new medication or having a procedure. -Monitor for signs and symptoms of bleeding (abnormal bruising, prolonged bleeding, nose bleeds, bleeding from gums, discolored urine, black tarry stools). If you have fallen and hit your head OR if your bleeding is severe or not stopping, seek emergency care.  -Go to the emergency room if emergent signs and symptoms of new clot occur (new or worse swelling and pain in an arm or leg, shortness of breath, chest pain, fast or irregular heartbeats, lightheadedness, dizziness, fainting, coughing up blood) or if you experience a significant color change (pale or blue) in the extremity that has the DVT.  -We recommend you wear compression stockings (20-30 mmHg) as long as you are having swelling or pain. Be sure to purchase the correct size and take them off at night.   If you have any questions or need to reschedule an appointment, please call [phone]. If you are having an emergency, call 911 or present to the nearest emergency room.   What is a DVT?  -Deep vein thrombosis (DVT) is a condition in which a blood clot forms in a vein of the deep venous system which can occur in the lower leg, thigh, pelvis, arm, or neck. This condition is serious and can be life-threatening if the clot travels to the arteries of the lungs and causing a blockage (pulmonary embolism, PE).  A DVT can also damage veins in the leg, which can lead to long-term venous disease, leg pain, swelling, discoloration, and ulcers or sores (post-thrombotic syndrome).  -Treatment may include taking an anticoagulant medication to prevent more clots from forming and the current clot from growing, wearing compression stockings, and/or surgical procedures to remove or dissolve the clot.

## 2023-10-11 ENCOUNTER — Inpatient Hospital Stay: Attending: Oncology | Admitting: Oncology

## 2023-10-11 ENCOUNTER — Encounter: Payer: Self-pay | Admitting: Oncology

## 2023-10-11 ENCOUNTER — Inpatient Hospital Stay

## 2023-10-11 VITALS — BP 150/74 | HR 74 | Temp 97.8°F | Resp 16 | Ht 69.0 in | Wt 171.3 lb

## 2023-10-11 DIAGNOSIS — I82402 Acute embolism and thrombosis of unspecified deep veins of left lower extremity: Secondary | ICD-10-CM | POA: Insufficient documentation

## 2023-10-11 DIAGNOSIS — R7989 Other specified abnormal findings of blood chemistry: Secondary | ICD-10-CM | POA: Diagnosis not present

## 2023-10-11 DIAGNOSIS — Z7901 Long term (current) use of anticoagulants: Secondary | ICD-10-CM | POA: Insufficient documentation

## 2023-10-11 DIAGNOSIS — F17210 Nicotine dependence, cigarettes, uncomplicated: Secondary | ICD-10-CM | POA: Insufficient documentation

## 2023-10-11 DIAGNOSIS — I824Z2 Acute embolism and thrombosis of unspecified deep veins of left distal lower extremity: Secondary | ICD-10-CM

## 2023-10-11 NOTE — Progress Notes (Signed)
 [name] at [facility]  HEMATOLOGY NEW VISIT  Shelley Rush, MD  REASON FOR REFERRAL: Venous thromboembolism  SUMMARY OF HEMATOLOGIC HISTORY: Date: 7 years ago 1st episode: Right leg DVT Risk factors: Recent fall, smoking Treatment: Coumadin   Date: 09/30/2023  Second episode: Left leg DVT Risk factors: Spine surgery in 12/2022 limiting mobility, recent 3-hour travel to the beach via car with 1 break, smoking, old-age Current treatment: Eliquis  5 mg twice daily  HISTORY OF PRESENT ILLNESS: [name] 78 y.o. female referred for thromboembolism.  Patient has a past medical history of CKD, hypertension, hyperlipidemia, diabetes.  She had a past medical history of right leg DVT several years ago after fall and took anticoagulation for a short period.  She reported that 2 days prior to the recent emergency visit on 09/30/2023 she was felt that her left leg was swollen and tight and felt like she had a blood clot from prior experience.  She reached out to primary care's office who told her to go to the ER.  She had an ultrasound done at that time showing an extensive left leg DVT.  She was then seen by vascular surgery for probable thrombectomy but patient refused a CT venogram as she was worried about worsening her CKD.  She was then offered medical management with Eliquis .  Patient today reports that she is feeling better with decreased swelling and tightness in her left leg.  She is not taking ESA shots for her chronic kidney disease.She smokes about a pack every couple of days and has been smoking for many years. She recently traveled to the beach in May, a three-hour trip with a rest stop break.  She reported that she had a spine surgery in September 2024 and since then her mobility has severely decreased.  She tries to walk around the house and do some chores but not as well as before.  She had a colonoscopy done in 2023 with polypectomy and no concerns for  cancer, she is up-to-date on mammogram and Pap smear and now out of range by age for any cancer screening.  Her son has a history of blood clots, which occurred while he was exercising. He had a clot in his leg and one that traveled to his lung. No hereditary cause was identified for his clots.  She denies shortness of breath at this time but reports cough that has worsened over the past week without hemoptysis.  He has COPD and uses an inhaler as needed.  She has no other complaints and is doing well at baseline.  I have reviewed the past medical history, past surgical history, social history and family history with the patient   ALLERGIES:  is allergic to ace inhibitors, celecoxib, hydrocodone -acetaminophen , and zofran  [ondansetron  hcl].  MEDICATIONS:  Current Outpatient Medications  Medication Sig Dispense Refill   ACCU-CHEK GUIDE test strip TESTING TWICE DAILYT     Accu-Chek Softclix Lancets lancets 2 (two) times daily.     acetaminophen  (TYLENOL ) 500 MG tablet Take 1,000 mg by mouth every 8 (eight) hours as needed for moderate pain.     albuterol  (VENTOLIN  HFA) 108 (90 Base) MCG/ACT inhaler Inhale 1-2 puffs into the lungs every 6 (six) hours as needed for wheezing or shortness of breath. 1 each 0   apixaban  (ELIQUIS ) 5 MG TABS tablet Take 1 tablet (5 mg total) by mouth 2 (two) times daily. 60 tablet 5   APIXABAN  (ELIQUIS ) VTE STARTER PACK (10MG  AND 5MG ) Take  as directed on package: start with two-5mg  tablets twice daily for 7 days. On day 8, switch to one-5mg  tablet twice daily. 74 each 0   aspirin  81 MG tablet Take 81 mg by mouth every evening.      bisoprolol  (ZEBETA ) 10 MG tablet Take 1 tablet (10 mg total) by mouth daily. 30 tablet 0   calcitRIOL (ROCALTROL) 0.25 MCG capsule Take 0.25 mcg by mouth daily.     Cholecalciferol (VITAMIN D3) 125 MCG (5000 UT) CAPS Take 5,000 Units by mouth daily.     HYDROcodone -acetaminophen  (NORCO/VICODIN) 5-325 MG tablet Take 1 tablet by mouth every 8  (eight) hours as needed (pain). 5 tablet 0   JARDIANCE  10 MG TABS tablet Take 1 tablet (10 mg total) by mouth every morning. 30 tablet 0   linagliptin  (TRADJENTA ) 5 MG TABS tablet Take 1 tablet (5 mg total) by mouth daily. 30 tablet 0   metFORMIN (GLUCOPHAGE-XR) 500 MG 24 hr tablet Take 500 mg by mouth 2 (two) times daily.     NIFEdipine  (PROCARDIA  XL/NIFEDICAL-XL) 90 MG 24 hr tablet Take 1 tablet (90 mg total) by mouth daily. 30 tablet 0   Omega-3 Fatty Acids  (FISH OIL) 1200 MG CAPS Take 1,200 mg by mouth daily.     pantoprazole  (PROTONIX ) 40 MG tablet Take 1 tablet (40 mg total) by mouth daily. 30 tablet 0   simvastatin  (ZOCOR ) 20 MG tablet Take 1 tablet (20 mg total) by mouth daily at 6 PM. 30 tablet 0   zolpidem  (AMBIEN ) 10 MG tablet Take 1 tablet (10 mg total) by mouth at bedtime. 30 tablet 0   No current facility-administered medications for this visit.     REVIEW OF SYSTEMS:   Constitutional: Denies fevers, chills or night sweats Eyes: Denies blurriness of vision Ears, nose, mouth, throat, and face: Denies mucositis or sore throat Respiratory: Denies cough, dyspnea or wheezes Cardiovascular: Denies palpitation, chest discomfort or lower extremity swelling Gastrointestinal:  Denies nausea, heartburn or change in bowel habits Skin: Denies abnormal skin rashes Lymphatics: Denies new lymphadenopathy or easy bruising Neurological:Denies numbness, tingling or new weaknesses Behavioral/Psych: Mood is stable, no new changes  All other systems were reviewed with the patient and are negative.  PHYSICAL EXAMINATION:   Vitals:   10/11/23 1305  BP: (!) 150/74  Pulse: 74  Resp: 16  Temp: 97.8 F (36.6 C)  SpO2: 98%    GENERAL:alert, no distress and comfortable SKIN: skin color, texture, turgor are normal, no rashes or significant lesions LYMPH:  no palpable lymphadenopathy in the cervical, axillary or inguinal LUNGS: clear to auscultation and percussion with normal breathing  effort HEART: regular rate & rhythm and no murmurs and no lower extremity edema ABDOMEN:abdomen soft, non-tender and normal bowel sounds Musculoskeletal:no cyanosis of digits and no clubbing, left leg slightly bigger than the right and is in compression stocking NEURO: alert & oriented x 3 with fluent speech  LABORATORY DATA:  I have reviewed the data as listed  Lab Results  Component Value Date   WBC 7.2 09/30/2023   NEUTROABS 4.2 09/30/2023   HGB 11.9 (L) 09/30/2023   HCT 38.2 09/30/2023   MCV 91.4 09/30/2023   PLT 225 09/30/2023      Chemistry      Component Value Date/Time   NA 137 09/30/2023 1831   K 5.4 (H) 09/30/2023 1831   CL 103 09/30/2023 1831   CO2 24 09/30/2023 1831   BUN 34 (H) 09/30/2023 1831   CREATININE 2.38 (H) 09/30/2023  1831      Component Value Date/Time   CALCIUM 9.8 09/30/2023 1831   ALKPHOS 106 09/30/2023 1831   AST 13 (L) 09/30/2023 1831   ALT 9 09/30/2023 1831   BILITOT 0.4 09/30/2023 1831      Latest Reference Range & Units 09/30/23 18:31  D-Dimer, Quant 0.00 - 0.50 ug/mL-FEU 7.07 (H)  (H): Data is abnormally high  RADIOGRAPHIC STUDIES: I have personally reviewed the radiological images as listed and agreed with the findings in the report.  US  Venous Img Lower Unilateral Left CLINICAL DATA:  Left leg swelling for 3 days  EXAM: Left LOWER EXTREMITY VENOUS DOPPLER ULTRASOUND  TECHNIQUE: Gray-scale sonography with compression, as well as color and duplex ultrasound, were performed to evaluate the deep venous system(s) from the level of the common femoral vein through the popliteal and proximal calf veins.  COMPARISON:  None Available.  FINDINGS: VENOUS  Occlusive noncompressible thrombus throughout the left lower extremity extending from the common femoral vein into the profunda femoral, femoral, popliteal, posterior tibial, and peroneal veins.  Limited views of the contralateral common femoral vein  are unremarkable.  OTHER  None.  Limitations: none  IMPRESSION: Extensive DVT throughout the left leg.  Critical Value/emergent results were called by telephone at the time of interpretation on 09/30/2023 at 7:58 pm to provider [facility] , who verbally acknowledged these results.  Electronically Signed   By: Norman Gatlin M.D.   On: 09/30/2023 20:03   ASSESSMENT & PLAN:  Patient is a 77 y.o. female referred for unprovoked DVT  Deep vein thrombosis (DVT) Thrombosis history as above Patient has recurrent DVT, current episode likely unprovoked but probably triggered by prolonged decrease in mobility and contributed by smoking Considering this is a recurrent DVT, patient would require lifelong anticoagulation  - Continue Eliquis  5 mg twice daily.  Can discuss dropping this to a prophylactic dose of 2.5 mg twice daily after 6 months considering patient's age and increased risk of bleeding. - Considering patient's age and comorbidities and the fact that it does not change treatment management, would not recommend thrombophilia testing at this time - Patient's D-dimer was significantly elevated correlating with the extent of DVT.  Can consider D-dimer based anticoagulation management at 6 months point, but considering the size of the thrombosis, I do not believe her D-dimer will be negative at the 6 months time  Return to clinic in 6 months to discuss further management   No orders of the defined types were placed in this encounter.   The total time spent in the appointment was 60 minutes encounter with patients including review of chart and various tests results, discussions about plan of care and coordination of care plan   All questions were answered. The patient knows to call the clinic with any problems, questions or concerns. No barriers to learning was detected.   Mickiel Dry, MD 7/11/20252:42 PM

## 2023-10-11 NOTE — Assessment & Plan Note (Signed)
 Thrombosis history as above Patient has recurrent DVT, current episode likely unprovoked but probably triggered by prolonged decrease in mobility and contributed by smoking Considering this is a recurrent DVT, patient would require lifelong anticoagulation  - Continue Eliquis  5 mg twice daily.  Can discuss dropping this to a prophylactic dose of 2.5 mg twice daily after 6 months considering patient's age and increased risk of bleeding. - Considering patient's age and comorbidities and the fact that it does not change treatment management, would not recommend thrombophilia testing at this time - Patient's D-dimer was significantly elevated correlating with the extent of DVT.  Can consider D-dimer based anticoagulation management at 6 months point, but considering the size of the thrombosis, I do not believe her D-dimer will be negative at the 6 months time  Return to clinic in 6 months to discuss further management

## 2023-10-15 ENCOUNTER — Other Ambulatory Visit (HOSPITAL_COMMUNITY): Payer: Self-pay | Admitting: Nephrology

## 2023-10-15 DIAGNOSIS — Z720 Tobacco use: Secondary | ICD-10-CM

## 2023-10-15 DIAGNOSIS — F17200 Nicotine dependence, unspecified, uncomplicated: Secondary | ICD-10-CM

## 2023-10-15 DIAGNOSIS — R053 Chronic cough: Secondary | ICD-10-CM

## 2023-11-04 ENCOUNTER — Ambulatory Visit (HOSPITAL_COMMUNITY)
Admission: RE | Admit: 2023-11-04 | Discharge: 2023-11-04 | Disposition: A | Discharge status: Home / Self Care | Source: Ambulatory Visit | Attending: Nephrology | Admitting: Nephrology

## 2023-11-04 DIAGNOSIS — Z122 Encounter for screening for malignant neoplasm of respiratory organs: Secondary | ICD-10-CM | POA: Diagnosis not present

## 2023-11-04 DIAGNOSIS — Z720 Tobacco use: Secondary | ICD-10-CM | POA: Diagnosis present

## 2023-11-04 DIAGNOSIS — R053 Chronic cough: Secondary | ICD-10-CM | POA: Insufficient documentation

## 2023-11-04 DIAGNOSIS — I700 Atherosclerosis of aorta: Secondary | ICD-10-CM | POA: Insufficient documentation

## 2023-11-04 DIAGNOSIS — F17200 Nicotine dependence, unspecified, uncomplicated: Secondary | ICD-10-CM | POA: Insufficient documentation

## 2023-11-04 DIAGNOSIS — R918 Other nonspecific abnormal finding of lung field: Secondary | ICD-10-CM | POA: Diagnosis not present

## 2023-11-04 DIAGNOSIS — I2510 Atherosclerotic heart disease of native coronary artery without angina pectoris: Secondary | ICD-10-CM | POA: Insufficient documentation

## 2023-11-04 DIAGNOSIS — Z87891 Personal history of nicotine dependence: Secondary | ICD-10-CM | POA: Insufficient documentation

## 2023-11-19 ENCOUNTER — Ambulatory Visit: Admission: EM | Admit: 2023-11-19 | Discharge: 2023-11-19 | Disposition: A | Discharge status: Home / Self Care

## 2023-11-19 DIAGNOSIS — Z8739 Personal history of other diseases of the musculoskeletal system and connective tissue: Secondary | ICD-10-CM

## 2023-11-19 DIAGNOSIS — M2540 Effusion, unspecified joint: Secondary | ICD-10-CM

## 2023-11-19 MED ORDER — DEXAMETHASONE SODIUM PHOSPHATE 10 MG/ML IJ SOLN
10.0000 mg | INTRAMUSCULAR | Status: AC
  Administered 2023-11-19: 10 mg via INTRAMUSCULAR

## 2023-11-19 MED ORDER — PREDNISONE 20 MG PO TABS
20.0000 mg | ORAL_TABLET | Freq: Every day | ORAL | 0 refills | Status: AC
Start: 2023-11-19 — End: 2023-11-26

## 2023-11-19 NOTE — Discharge Instructions (Addendum)
 Blood work is pending.  You will be contacted if the pending test results are abnormal. You were given an injection of Decadron  10 mg.  Start the prednisone  on 11/20/2023. You may take over-the-counter Tylenol  as needed for pain or discomfort. Apply ice to the affected joints to help with pain and swelling. Recommend dietary modifications to help prevent future gout flares.  I have provided information you can refer to. Please monitor your blood glucose levels closely while you are taking the steroid.  If your blood glucose exceeds 350, please stop the medication immediately and follow-up with your primary care physician. Follow-up with your primary care physician as scheduled. Follow-up as needed.
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
## 2023-11-19 NOTE — ED Triage Notes (Signed)
 Pt reports she is having a gout flare up in both of her hands  x 2 weeks. States it is worsening. Could not get into her PCP as they are moving. Hurts to move her left wrist and lift things. Has some swelling in right hand  Was unaware of the need to change her diet due to gout. Still eats shellfish.      States she is out of her indomethacin.

## 2023-11-20 ENCOUNTER — Ambulatory Visit (HOSPITAL_COMMUNITY): Payer: Self-pay

## 2023-11-20 LAB — URIC ACID: Uric Acid: 7.2 mg/dL (ref 3.1–7.9)

## 2023-11-26 ENCOUNTER — Encounter: Admitting: Thoracic Surgery (Cardiothoracic Vascular Surgery)

## 2023-12-06 ENCOUNTER — Other Ambulatory Visit (HOSPITAL_COMMUNITY): Payer: Self-pay

## 2023-12-06 DIAGNOSIS — M4316 Spondylolisthesis, lumbar region: Secondary | ICD-10-CM

## 2023-12-12 ENCOUNTER — Ambulatory Visit (HOSPITAL_COMMUNITY)
Admission: RE | Admit: 2023-12-12 | Discharge: 2023-12-12 | Disposition: A | Discharge status: Home / Self Care | Source: Ambulatory Visit

## 2023-12-12 DIAGNOSIS — M4316 Spondylolisthesis, lumbar region: Secondary | ICD-10-CM | POA: Diagnosis present

## 2023-12-13 ENCOUNTER — Ambulatory Visit (HOSPITAL_COMMUNITY)
Admission: RE | Admit: 2023-12-13 | Discharge: 2023-12-13 | Disposition: A | Discharge status: Home / Self Care | Source: Ambulatory Visit

## 2023-12-13 DIAGNOSIS — M47816 Spondylosis without myelopathy or radiculopathy, lumbar region: Secondary | ICD-10-CM

## 2023-12-13 DIAGNOSIS — M4316 Spondylolisthesis, lumbar region: Secondary | ICD-10-CM | POA: Diagnosis present

## 2023-12-13 DIAGNOSIS — Z981 Arthrodesis status: Secondary | ICD-10-CM

## 2023-12-13 DIAGNOSIS — M48061 Spinal stenosis, lumbar region without neurogenic claudication: Secondary | ICD-10-CM

## 2023-12-16 NOTE — Progress Notes (Signed)
 [facility] Follow Up Visit   Patient Name: [name], female   Patient DOB: 1945/12/08 Date of Service: 12/16/2023  Patient MRN: [id number] Provider Creating Note: Woodward Brought, MD  [id number] Primary Care Physician: Marvine Norleen BROCKS, MD   [address] [place] [address] Additional Physicians/ Providers:   Impression/Recommendations   [name] is a 78 y.o. female with diabetes mellitus type II, hypertension, hyperlipidemia, on going tobacco use who presents for follow up for chronic kidney disease stage IV. Creatinine 2.62, GFR of 18. KFRE: 10.98% in 2 years and 31.55% in 5 years.   Chronic Kidney Disease stage IV: with proteinuria: - not on ACE-I/ARB due to history of angioedema. - Continue empagliflozin  - not on a mineralocorticooid receptor antagonist.  - Completed Kidney Education class - Avoid NSAIDs.   Hypertension with chronic kidney disease: 175/85. Currently with pain and anxiety. Documented history of white coat hypertension.  - Current regimen of bisoprolol  and nifedipine .  - not currently on a diuretic - Home blood pressure monitoring.   Diabetes mellitus type II with chronic kidney disease: noninsulin dependent.  - Continue empagliflozin   Secondary Hyperparathyroidism: PTH 65 - Continue cholecalciferol  - discontinue calcitriol  Anemia of chronic kidney disease: hemoglobin at goal. 12.   Patient Active Problem List  Diagnosis  . Hypercalcemia  . Proteinuria  . Hypertensive chronic kidney disease, benign, with chronic kidney disease stage I through stage IV, or unspecified  . Type 2 diabetes mellitus with diabetic chronic kidney disease (HCC)  . Hyperparathyroidism due to renal insufficiency (HCC)  . Renal mass  . Chronic kidney disease stage 4 (HCC)  . Benign essential hypertension  . Edema of lower extremity  . Hyperkalemia  . Angioedema  . Anemia in chronic kidney disease    Orders Placed This Encounter  . Renal  Function Panel  . Urinalysis, Complete w/reflex to Culture  . Protein, Total, Random Urine w/Creatinine (Protein/Creat Ratio)       Return in about 4 weeks (around 01/13/2024).  Chief Complaint   Chief Complaint  Patient presents with  . Follow-up    History of Present Illness   [name] presents for follow up. Patient presents by herself.   Patient has been having severe right hip and thigh pain. She is not able to walk without a walker. She is now being evaluated by neurosurgery. Patient denies use of nonsteroidal anti-inflammatory agents.   Patient was found to have a lung mass on CT. She is scheduled to see thoracic surgery later this week.   Patient states she is taking all her medications as prescribed. Patient states she is not having any issues with her medications.   Denies any lower extremity swelling.   Patient continues to smoke.   No recent hospitalizations.    Medications   Current Outpatient Medications:  .  Breztri Aerosphere 160-9-4.8 MCG/ACT aerosol, , Disp: , Rfl:  .  oxyCODONE  (ROXICODONE ) 5 MG immediate release tablet, take 1 tablet by oral route  every 5 - 6 hours as needed as needed, Disp: , Rfl:  .  acetaminophen  (TYLENOL  8 HOUR) 650 MG 8 hr tablet, Take 650 mg by mouth every 8 (eight) hours if needed for mild pain Do not crush, chew, or split., Disp: , Rfl:  .  albuterol  HFA (PROVENTIL  HFA;VENTOLIN  HFA) 108 (90 Base) MCG/ACT inhaler, Take 2 puffs by mouth every 4 (four) to 6 (six) hours if needed, Disp: , Rfl:  .  apixaban  (ELIQUIS ) 5  MG tablet, Take 5 mg by mouth in the morning and 5 mg in the evening., Disp: , Rfl:  .  Aspirin  Buf,CaCarb-MgCarb-MgO, 81 MG tablet, Take 81 mg by mouth 1 (one) time each day, Disp: , Rfl:  .  bisoprolol  (ZEBETA ) 10 MG tablet, Take 1 tablet (10 mg total) by mouth 1 (one) time each day, Disp: 30 tablet, Rfl: 11 .  Cholecalciferol (Vitamin D3) 25 MCG (1000 UT) capsule, Take 5,000 Units by mouth 1 (one) time each  day, Disp: , Rfl:  .  Empagliflozin  10 MG tablet, Take 10 mg by mouth 1 (one) time each day in the morning, Disp: 30 tablet, Rfl: 11 .  HYDROcodone -acetaminophen  (NORCO) 5-325 MG per tablet, Take 1 tablet by mouth every 6 (six) hours if needed for moderate pain, Disp: , Rfl:  .  lidocaine  (LIDODERM ) 5 % patch, Apply 1 patch topically 1 (one) time each day if needed, Disp: , Rfl:  .  linaGLIPtin  (Tradjenta ) 5 MG tablet, Take 1 tablet by mouth 1 (one) time each day, Disp: , Rfl:  .  NIFEdipine  CC (ADALAT  CC) 90 MG 24 hr tablet, Take 1 tablet by mouth 1 (one) time each day    , Disp: , Rfl:  .  omega-3 (FISH OIL) 1200 MG capsule, Take 1 capsule by mouth 1 (one) time each day, Disp: , Rfl:  .  pantoprazole  (PROTONIX ) 40 MG EC tablet, Take 1 tablet by mouth 1 (one) time each day, Disp: , Rfl:  .  simvastatin  (ZOCOR ) 20 MG tablet, Take 1 tablet by mouth 1 (one) time each day, Disp: , Rfl:  .  tiZANidine (ZANAFLEX) 4 MG tablet, Take 4 mg by mouth every 6 (six) hours if needed, Disp: , Rfl:  .  zolpidem  (AMBIEN ) 10 MG tablet, Take 1 tablet by mouth at bed time, Disp: , Rfl:    Allergies Ace inhibitors, Hydrocodone -acetaminophen , Hydrocodone , Levofloxacin, Celecoxib, and Ondansetron   History Past Medical History:  Diagnosis Date  . Anemia in chronic kidney disease 01/21/2023  . Angioedema 10/24/2022  . Chronic kidney disease, stage 3b 01/26/2019  . Degeneration of intervertebral disc   . Hypercalcemia 01/26/2019  . Hyperkalemia 10/24/2022  . Hyperlipidemia   . Hypertensive chronic kidney disease, benign, with chronic kidney disease stage I through stage IV, or unspecified 01/26/2019  . Lung mass   . Osteoarthritis   . Proteinuria 01/26/2019  . Renal mass 02/07/2021  . Secondary hyperparathyroidism of renal origin (HCC) 05/03/2020  . Type 2 diabetes mellitus with diabetic chronic kidney disease (HCC) 01/26/2019    Past Surgical History:  Procedure Laterality Date  . BACK SURGERY  12/04/2022    Dr. Cheryle  . CATARACT EXTRACTION, BILATERAL    . HYSTERECTOMY    . KNEE SURGERY Right   . NECK SURGERY    . TONSILLECTOMY     Family History  Problem Relation Age of Onset  . Hypertension Mother   . Stroke Mother   . Dementia Father    Social History   Tobacco Use  . Smoking status: Every Day    Current packs/day: 0.50    Types: Cigarettes  . Smokeless tobacco: Never  Substance Use Topics  . Alcohol use: Yes    Alcohol/week: 1.0 standard drink of alcohol    Types: 1 Glasses of wine per week    Comment: occasional      Physical Exam  Vitals BP 142/72 (BP Location: Left upper arm, Patient Position: Sitting)   Pulse 67   Temp  98.3 F   Wt 170 lb (77.1 kg)   SpO2 97%   BMI 24.39 kg/m   Vitals reviewed. Constitutional: She is oriented to person, place, and time.  HEENT:  Right Ear: Hearing normal.  Left Ear: Hearing normal.  Nose: Nose normal. Mouth/Throat: Oropharynx is clear and moist.  Eyes: Conjunctivae and EOM are normal. Pupils are equal, round, and reactive to light.  Cardiovascular:  Normal rate, regular rhythm and intact distal pulses.           Pulmonary/Chest: Effort normal and breath sounds normal.  Abdominal: Soft. Bowel sounds are normal.  Left flank pain  Neurological: She is alert and oriented to person, place, and time.  Skin: Skin is warm and dry.  Psychiatric: She has a normal mood and affect. Her behavior is normal. Judgment normal.     Laboratory Studies  Chemistry  Lab Units 12/11/23 1500 10/08/23 1428 07/23/23 1525 04/23/23 1026 01/17/23 1443 10/18/22 1309 04/19/22 1433 03/06/22 1433  SODIUM mmol/L 136 136 139 139 137 138   < > 139  POTASSIUM mmol/L 5.3 4.7 4.9 5.1 5.0 4.6   < > 5.1  CHLORIDE mmol/L 102 104 105 105 103 106   < > 105  CO2 mmol/L 28 25 27 25 25 20    < > 27  MAGNESIUM mg/dL  --   --   --   --   --   --   --  1.9  CALCIUM mg/dL 89.7 9.4 9.5 89.4* 9.8 9.9   < > 9.8  PHOSPHORUS mg/dL 3.4 3.4 4.3 3.7 3.1 3.5   <  > 3.0  PTH pg/mL 65 121* 232* 103* 97* 112*   < > 116*  GLUCOSE mg/dL 796* 840* 765* 834* 786* 164*   < > 174*  ALBUMIN g/dL 4.1 3.9 4.2 4.3 3.9 4.2   < > 4.1  BUN mg/dL 33* 27* 31* 27* 22 29*   < > 20  CREATININE mg/dL 7.37* 7.58* 7.60* 7.78* 2.13* 2.39*   < > 1.93*   < > = values in this interval not displayed.        No lab exists for component: IRON SATURATION, TRANSSATPER  CBC  Lab Units 12/11/23 1500 10/08/23 1457 10/08/23 1428 07/23/23 1525 04/23/23 1026 01/17/23 1443 10/18/22 1309  WBC AUTO Thousand/uL 5.2  --  7.1 5.6 5.6 7.6 5.2  HEMOGLOBIN g/dL 87.9  --  88.9* 88.2 87.9 10.8* 12.4  HEMOGLOBIN URINE  TRACE* TRACE*  --  NEGATIVE NEGATIVE NEGATIVE NEGATIVE  HEMATOCRIT % 39.6  --  35.8 37.2 39.1 34.5* 39.3  MCV fL 91.7  --  90.4 87.5 85.9 88.2 89.9  PLATELETS AUTO Thousand/uL 258  --  307 256 290 406* 335    Urine  Lab Units 12/11/23 1500 10/08/23 1457 07/23/23 1525 04/23/23 1026 01/17/23 1443 10/18/22 1309  COLOR U  YELLOW YELLOW YELLOW YELLOW YELLOW YELLOW  KETONES U MG/DL  NEGATIVE NEGATIVE NEGATIVE NEGATIVE NEGATIVE NEGATIVE  PROT/CREAT RATIO UR mg/g creat 1.333*  1,333* 1.441*  1,441* 0.655*  655* 0.875*  875* 0.894*  894* 0.770*  770*        No lab exists for component: CYCLOSPORITR     Woodward Brought, MD  [employer], [place]

## 2023-12-17 ENCOUNTER — Encounter: Payer: Self-pay | Admitting: Thoracic Surgery (Cardiothoracic Vascular Surgery)

## 2023-12-17 ENCOUNTER — Ambulatory Visit
Attending: Thoracic Surgery (Cardiothoracic Vascular Surgery) | Admitting: Thoracic Surgery (Cardiothoracic Vascular Surgery)

## 2023-12-17 VITALS — BP 131/72 | HR 66 | Resp 20 | Ht 69.0 in | Wt 170.0 lb

## 2023-12-17 DIAGNOSIS — R911 Solitary pulmonary nodule: Secondary | ICD-10-CM | POA: Diagnosis not present
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
## 2024-01-14 ENCOUNTER — Other Ambulatory Visit: Payer: Self-pay | Admitting: Thoracic Surgery (Cardiothoracic Vascular Surgery)

## 2024-01-14 ENCOUNTER — Telehealth: Payer: Self-pay | Admitting: Pharmacist

## 2024-01-14 DIAGNOSIS — R911 Solitary pulmonary nodule: Secondary | ICD-10-CM

## 2024-01-14 NOTE — Telephone Encounter (Signed)
 Received a call from patient requesting to schedule an appointment in the DVT Clinic with [facility]. She stated she had questions about her medication and she reported new pain in her right leg. Denied swelling and stated she has been having difficulty straightening her right leg. She reported adherence to Eliquis  as prescribed. She has been discharged from the DVT Clinic and was referred to hematology who she saw on 10/11/23 and has follow up scheduled with in January. Discussed that she should call hematology if she has questions about Eliquis  as they are managing her anticoagulation. Also discussed that she should reach out to her PCP regarding new right leg pain for further evaluation. Patient confirmed understanding of plan.

## 2024-01-22 ENCOUNTER — Ambulatory Visit (HOSPITAL_COMMUNITY)
Admission: RE | Admit: 2024-01-22 | Discharge: 2024-01-22 | Disposition: A | Discharge status: Home / Self Care | Source: Ambulatory Visit | Attending: Thoracic Surgery (Cardiothoracic Vascular Surgery) | Admitting: Thoracic Surgery (Cardiothoracic Vascular Surgery)

## 2024-01-22 DIAGNOSIS — R911 Solitary pulmonary nodule: Secondary | ICD-10-CM | POA: Diagnosis present

## 2024-01-22 LAB — GLUCOSE, CAPILLARY: Glucose-Capillary: 155 mg/dL — ABNORMAL HIGH (ref 70–99)

## 2024-01-22 MED ORDER — FLUDEOXYGLUCOSE F - 18 (FDG) INJECTION
8.5000 | Freq: Once | INTRAVENOUS | Status: AC
  Administered 2024-01-22: 8.5 via INTRAVENOUS

## 2024-01-28 ENCOUNTER — Encounter: Payer: Self-pay | Admitting: Thoracic Surgery (Cardiothoracic Vascular Surgery)

## 2024-01-28 ENCOUNTER — Ambulatory Visit
Attending: Thoracic Surgery (Cardiothoracic Vascular Surgery) | Admitting: Thoracic Surgery (Cardiothoracic Vascular Surgery)

## 2024-01-28 VITALS — BP 131/75 | HR 73 | Resp 20 | Ht 69.0 in | Wt 170.0 lb

## 2024-01-28 DIAGNOSIS — R911 Solitary pulmonary nodule: Secondary | ICD-10-CM | POA: Diagnosis not present

## 2024-01-28 NOTE — Progress Notes (Signed)
 [address], [place] [address]             [phone]    HPI: Mrs. Bellin returns for follow-up regarding multiple lung nodules.  Ivee Poellnitz is an 78 year old woman with a history of tobacco use, COPD, hypertension, chronic kidney disease, arthritis, multinodular goiter, gout, recent DVT, type II non-insulin -dependent diabetes, back surgery, and lung nodules.  She had a low-dose CT for lung cancer screening in August which showed numerous bilateral lung nodules.  Largest was in the anterior left upper lobe and measured about 9 mm.  No adenopathy.  I saw her in the office in September.  Recommended a follow-up with a PET/CT to further evaluate the nodules.  She had that scan done and now returns.  Over the past year she has lost about 50 pounds, 15 pounds in the past 3 months.  She has had a poor appetite and decreased energy.  Also having severe back and hip pain and cannot lie on her back.  Physical activity very limited due to her pain.  Patient Active Problem List   Diagnosis Date Noted   Lung nodule 12/17/2023   Benign hypertension with CKD (chronic kidney disease) stage IV (HCC) 12/12/2022   CKD stage 4 due to type 2 diabetes mellitus (HCC) 12/12/2022   GERD without esophagitis 12/12/2022   Hyperlipidemia associated with type 2 diabetes mellitus (HCC) 12/12/2022   Chronic insomnia 12/12/2022   Aortic atherosclerosis 12/12/2022   Spondylolisthesis of lumbar region 12/04/2022   Edema of lower extremity 01/02/2022   Hyperparathyroidism due to renal insufficiency 05/03/2020   Chronic kidney disease, stage 4 (severe) (HCC) 01/26/2019   Hypercalcemia 01/26/2019   Proteinuria 01/26/2019   Benign hypertensive kidney disease with chronic kidney disease 01/26/2019   Osteoarthritis of left knee 07/26/2017   Osteoarthritis of right knee 06/20/2017   Multinodular goiter    Bilateral carotid bruits 11/08/2015   Obesity (BMI 30.0-34.9) 11/08/2015    Hyperlipidemia 11/08/2015   Gastritis and gastroduodenitis    Nausea with vomiting    Exertional chest pain 08/13/2014   Encounter for annual physical exam 05/12/2014   Hypokalemia 07/03/2012   Abdominal pain 07/01/2012   Dehydration 07/01/2012   Right kidney mass 07/01/2012   Tobacco abuse 07/01/2012   Secondary DM with peripheral vascular disease (HCC) 07/01/2012   Chronic back pain 07/01/2012   Abnormal gall bladder diagnostic imaging 07/01/2012   Homocysteinemia 11/29/2010   Deep vein thrombosis (DVT) (HCC) 11/13/2010   ARTHRITIS, LEFT KNEE 04/21/2009   Essential hypertension 04/21/2009      Current Outpatient Medications  Medication Sig Dispense Refill   ACCU-CHEK GUIDE test strip TESTING TWICE DAILYT     Accu-Chek Softclix Lancets lancets 2 (two) times daily.     acetaminophen  (TYLENOL ) 500 MG tablet Take 1,000 mg by mouth every 8 (eight) hours as needed for moderate pain.     albuterol  (VENTOLIN  HFA) 108 (90 Base) MCG/ACT inhaler Inhale 1-2 puffs into the lungs every 6 (six) hours as needed for wheezing or shortness of breath. 1 each 0   apixaban  (ELIQUIS ) 5 MG TABS tablet Take 1 tablet (5 mg total) by mouth 2 (two) times daily. 60 tablet 5   APIXABAN  (ELIQUIS ) VTE STARTER PACK (10MG  AND 5MG ) Take as directed on package: start with two-5mg  tablets twice daily for 7 days. On day 8, switch to one-5mg  tablet twice daily. 74 each 0   aspirin  81 MG tablet Take 81 mg by  mouth every evening.      bisoprolol  (ZEBETA ) 10 MG tablet Take 1 tablet (10 mg total) by mouth daily. 30 tablet 0   calcitRIOL (ROCALTROL) 0.25 MCG capsule Take 0.25 mcg by mouth daily.     Cholecalciferol (VITAMIN D3) 125 MCG (5000 UT) CAPS Take 5,000 Units by mouth daily.     HYDROcodone -acetaminophen  (NORCO/VICODIN) 5-325 MG tablet Take 1 tablet by mouth every 8 (eight) hours as needed (pain). 5 tablet 0   indomethacin (INDOCIN) 50 MG capsule Take 50 mg by mouth 3 (three) times daily.     JARDIANCE  10 MG TABS  tablet Take 1 tablet (10 mg total) by mouth every morning. 30 tablet 0   linagliptin  (TRADJENTA ) 5 MG TABS tablet Take 1 tablet (5 mg total) by mouth daily. 30 tablet 0   metFORMIN (GLUCOPHAGE-XR) 500 MG 24 hr tablet Take 500 mg by mouth 2 (two) times daily.     NIFEdipine  (PROCARDIA  XL/NIFEDICAL-XL) 90 MG 24 hr tablet Take 1 tablet (90 mg total) by mouth daily. 30 tablet 0   Omega-3 Fatty Acids  (FISH OIL) 1200 MG CAPS Take 1,200 mg by mouth daily.     pantoprazole  (PROTONIX ) 40 MG tablet Take 1 tablet (40 mg total) by mouth daily. 30 tablet 0   simvastatin  (ZOCOR ) 20 MG tablet Take 1 tablet (20 mg total) by mouth daily at 6 PM. 30 tablet 0   zolpidem  (AMBIEN ) 10 MG tablet Take 1 tablet (10 mg total) by mouth at bedtime. 30 tablet 0   No current facility-administered medications for this visit.    Physical Exam BP 131/75 (BP Location: Left Arm, Patient Position: Sitting, Cuff Size: Normal)   Pulse 73   Resp 20   Ht 5' 9 (1.753 m)   Wt 170 lb (77.1 kg)   LMP  (LMP Unknown)   SpO2 94% Comment: RA  BMI 25.22 kg/m  78 year old woman in no acute distress, in wheelchair Alert and oriented x 3 Cardiac regular rate and rhythm Lungs clear bilaterally  Diagnostic Tests: NUCLEAR MEDICINE PET SKULL BASE TO THIGH   TECHNIQUE: 8.5 mCi F-18 FDG was injected intravenously. Full-ring PET imaging was performed from the skull base to thigh after the radiotracer. CT data was obtained and used for attenuation correction and anatomic localization.   Fasting blood glucose: 155 mg/dl   COMPARISON:  CT chest 11/04/2023.   FINDINGS: Mediastinal blood pool activity: SUV max 2.8   Liver activity: SUV max NA   NECK:   Mild misregistration artifact.  No abnormal hypermetabolism.   Incidental CT findings:   None.   CHEST:   Anterior segment left upper lobe nodule, seen on 11/04/2023, has resolved in the interval. No abnormal hypermetabolism.   Incidental CT findings:   Somewhat  asymmetrically enlarged right lobe of the thyroid . Atherosclerotic calcification of the aorta, aortic valve and coronary arteries. Heart is enlarged. No pericardial or pleural effusion. Centrilobular emphysema. Scattered millimetric pulmonary nodules, as on lung cancer screening CT 11/04/2023, too small for PET resolution. Some of the larger nodules seen previously have resolved as well.   ABDOMEN/PELVIS:   No abnormal hypermetabolism.   Incidental CT findings:   Gallstones. Small left adrenal adenoma. No specific follow-up necessary. Hyperdense lesion in the right kidney measures 6.2 cm, minimally enlarged from 5.4 cm on 01/12/2021. Adjacent hyperdense lesion along the posterior margin measures 1.9 cm and 54 Hounsfield units. 4.4 cm interpolar left renal mass measures 4.4 cm and 31 Hounsfield units, enlarged from 3.0 cm on  01/12/2021. Additional small low-attenuation and high attenuation lesions are too small to characterize. Right renal atrophy.   SKELETON:   No abnormal hypermetabolism.   Incidental CT findings:   Postoperative changes in the lumbar spine. Degenerative changes in the spine.   IMPRESSION: 1. Previously seen 9 mm anterior segment left upper lobe nodule has resolved in the interval. Additional bilateral pulmonary nodules are too small for PET resolution. Recommend return to annual lung cancer screening CT. 2. No abnormal hypermetabolism in the neck, chest, abdomen or pelvis. 3. Hyperdense lesions in the kidneys cannot be characterized as simple cysts. Further evaluation with pre and post contrast MRI should be considered. Pre and post contrast CT could alternatively be performed, but would likely be of decreased accuracy given lesion size. 4. Cholelithiasis. 5. Left adrenal adenoma. 6. Aortic atherosclerosis (ICD10-I70.0). Coronary artery calcification. 7.  Emphysema (ICD10-J43.9).     Electronically Signed   By: Newell Eke M.D.   On:  01/24/2024 16:13 I personally reviewed the PET/CT images.  Largest nodule in left upper lobe now only a thin rimmed cyst present at that area.  Multiple other nodules resolved.  Some new nodules.  None of the nodules are hypermetabolic.  Impression: [name] is an 78 year old woman with a history of tobacco use, COPD, hypertension, chronic kidney disease, arthritis, multinodular goiter, gout, recent DVT, type II non-insulin -dependent diabetes, back surgery, and lung nodules.  Lung nodules-most suspicious nodule in anterior left upper lobe has almost resolved but there is a thin-walled cystic lesion at that area.  Multiple other nodules are either smaller or resolved.  There are a few new nodules as well.  No hypermetabolic nodules.  Unlikely to be malignancy.  Much more likely to be infectious or inflammatory in nature.  Recommended we repeat a CT in about 6 months.  Renal findings on PET-radiology recommended an MRI with and without contrast.  Will defer that to Dr. Douglas, her nephrologist or Dr. Chesley her primary.  She has appointments with both of them coming up within the next week and will discuss it with them.  Plan: Return in 6 months with CT chest  Elspeth JAYSON Millers, MD [facility] [phone]

## 2024-02-15 ENCOUNTER — Emergency Department (HOSPITAL_COMMUNITY)
Admission: EM | Admit: 2024-02-15 | Discharge: 2024-02-15 | Disposition: A | Discharge status: Home / Self Care | Attending: Emergency Medicine | Admitting: Emergency Medicine

## 2024-02-15 ENCOUNTER — Emergency Department (HOSPITAL_COMMUNITY)

## 2024-02-15 ENCOUNTER — Encounter (HOSPITAL_COMMUNITY): Payer: Self-pay

## 2024-02-15 DIAGNOSIS — R531 Weakness: Secondary | ICD-10-CM | POA: Diagnosis not present

## 2024-02-15 DIAGNOSIS — E1122 Type 2 diabetes mellitus with diabetic chronic kidney disease: Secondary | ICD-10-CM | POA: Insufficient documentation

## 2024-02-15 DIAGNOSIS — Z7984 Long term (current) use of oral hypoglycemic drugs: Secondary | ICD-10-CM | POA: Insufficient documentation

## 2024-02-15 DIAGNOSIS — Z7982 Long term (current) use of aspirin: Secondary | ICD-10-CM | POA: Diagnosis not present

## 2024-02-15 DIAGNOSIS — E1165 Type 2 diabetes mellitus with hyperglycemia: Secondary | ICD-10-CM | POA: Insufficient documentation

## 2024-02-15 DIAGNOSIS — R0789 Other chest pain: Secondary | ICD-10-CM | POA: Diagnosis not present

## 2024-02-15 DIAGNOSIS — E860 Dehydration: Secondary | ICD-10-CM | POA: Insufficient documentation

## 2024-02-15 DIAGNOSIS — N189 Chronic kidney disease, unspecified: Secondary | ICD-10-CM | POA: Insufficient documentation

## 2024-02-15 DIAGNOSIS — Z79899 Other long term (current) drug therapy: Secondary | ICD-10-CM | POA: Insufficient documentation

## 2024-02-15 DIAGNOSIS — I129 Hypertensive chronic kidney disease with stage 1 through stage 4 chronic kidney disease, or unspecified chronic kidney disease: Secondary | ICD-10-CM | POA: Diagnosis not present

## 2024-02-15 DIAGNOSIS — R059 Cough, unspecified: Secondary | ICD-10-CM | POA: Insufficient documentation

## 2024-02-15 DIAGNOSIS — R112 Nausea with vomiting, unspecified: Secondary | ICD-10-CM | POA: Diagnosis not present

## 2024-02-15 DIAGNOSIS — Z7901 Long term (current) use of anticoagulants: Secondary | ICD-10-CM | POA: Diagnosis not present

## 2024-02-15 DIAGNOSIS — N390 Urinary tract infection, site not specified: Secondary | ICD-10-CM | POA: Diagnosis not present

## 2024-02-15 DIAGNOSIS — D3502 Benign neoplasm of left adrenal gland: Secondary | ICD-10-CM | POA: Diagnosis not present

## 2024-02-15 DIAGNOSIS — K8020 Calculus of gallbladder without cholecystitis without obstruction: Secondary | ICD-10-CM | POA: Diagnosis not present

## 2024-02-15 DIAGNOSIS — R509 Fever, unspecified: Secondary | ICD-10-CM | POA: Diagnosis present

## 2024-02-15 DIAGNOSIS — R1084 Generalized abdominal pain: Secondary | ICD-10-CM | POA: Diagnosis present

## 2024-02-15 DIAGNOSIS — R066 Hiccough: Secondary | ICD-10-CM | POA: Insufficient documentation

## 2024-02-15 DIAGNOSIS — E871 Hypo-osmolality and hyponatremia: Secondary | ICD-10-CM | POA: Diagnosis not present

## 2024-02-15 DIAGNOSIS — N2889 Other specified disorders of kidney and ureter: Secondary | ICD-10-CM | POA: Diagnosis not present

## 2024-02-15 DIAGNOSIS — I700 Atherosclerosis of aorta: Secondary | ICD-10-CM | POA: Diagnosis not present

## 2024-02-15 DIAGNOSIS — K5730 Diverticulosis of large intestine without perforation or abscess without bleeding: Secondary | ICD-10-CM | POA: Diagnosis not present

## 2024-02-15 DIAGNOSIS — R109 Unspecified abdominal pain: Secondary | ICD-10-CM | POA: Diagnosis present

## 2024-02-15 LAB — CBG MONITORING, ED
Glucose-Capillary: 384 mg/dL — ABNORMAL HIGH (ref 70–99)
Glucose-Capillary: 275 mg/dL — ABNORMAL HIGH (ref 70–99)

## 2024-02-15 LAB — URINALYSIS, ROUTINE W REFLEX MICROSCOPIC
Bilirubin Urine: NEGATIVE
Glucose, UA: >=500 mg/dL — AB
Ketones, ur: NEGATIVE mg/dL
Nitrite: NEGATIVE
Protein, ur: 100 mg/dL — AB
Specific Gravity, Urine: 1.012 (ref 1.005–1.030)
WBC, UA: >50 WBC/hpf (ref 0–5)
pH: 5 (ref 5.0–8.0)

## 2024-02-15 LAB — I-STAT CHEM 8, ED
BUN: 45 mg/dL — ABNORMAL HIGH (ref 8–23)
Calcium, Ion: 1.23 mmol/L (ref 1.15–1.40)
Chloride: 97 mmol/L — ABNORMAL LOW (ref 98–111)
Creatinine, Ser: 2.8 mg/dL — ABNORMAL HIGH (ref 0.44–1.00)
Glucose, Bld: 451 mg/dL — ABNORMAL HIGH (ref 70–99)
HCT: 45 % (ref 36.0–46.0)
Hemoglobin: 15.3 g/dL — ABNORMAL HIGH (ref 12.0–15.0)
Potassium: 4.3 mmol/L (ref 3.5–5.1)
Sodium: 130 mmol/L — ABNORMAL LOW (ref 135–145)
TCO2: 22 mmol/L (ref 22–32)

## 2024-02-15 LAB — COMPREHENSIVE METABOLIC PANEL WITH GFR
ALT: 14 U/L (ref 0–44)
AST: 13 U/L — ABNORMAL LOW (ref 15–41)
Albumin: 3.2 g/dL — ABNORMAL LOW (ref 3.5–5.0)
Alkaline Phosphatase: 83 U/L (ref 38–126)
Anion gap: 13 (ref 5–15)
BUN: 40 mg/dL — ABNORMAL HIGH (ref 8–23)
CO2: 21 mmol/L — ABNORMAL LOW (ref 22–32)
Calcium: 9.5 mg/dL (ref 8.9–10.3)
Chloride: 95 mmol/L — ABNORMAL LOW (ref 98–111)
Creatinine, Ser: 2.75 mg/dL — ABNORMAL HIGH (ref 0.44–1.00)
GFR, Estimated: 17 mL/min — ABNORMAL LOW (ref >60)
Glucose, Bld: 446 mg/dL — ABNORMAL HIGH (ref 70–99)
Potassium: 4.8 mmol/L (ref 3.5–5.1)
Sodium: 129 mmol/L — ABNORMAL LOW (ref 135–145)
Total Bilirubin: 0.8 mg/dL (ref 0.0–1.2)
Total Protein: 7.2 g/dL (ref 6.5–8.1)

## 2024-02-15 LAB — CBC
HCT: 42.3 % (ref 36.0–46.0)
Hemoglobin: 13.8 g/dL (ref 12.0–15.0)
MCH: 28.1 pg (ref 26.0–34.0)
MCHC: 32.6 g/dL (ref 30.0–36.0)
MCV: 86.2 fL (ref 80.0–100.0)
Platelets: 340 K/uL (ref 150–400)
RBC: 4.91 MIL/uL (ref 3.87–5.11)
RDW: 15.8 % — ABNORMAL HIGH (ref 11.5–15.5)
WBC: 8.8 K/uL (ref 4.0–10.5)
nRBC: 0 % (ref 0.0–0.2)

## 2024-02-15 LAB — LIPASE, BLOOD: Lipase: 31 U/L (ref 11–51)

## 2024-02-15 LAB — RESP PANEL BY RT-PCR (RSV, FLU A&B, COVID)  RVPGX2
Influenza A by PCR: NEGATIVE
Influenza B by PCR: NEGATIVE
Resp Syncytial Virus by PCR: NEGATIVE
SARS Coronavirus 2 by RT PCR: NEGATIVE

## 2024-02-15 MED ORDER — PANTOPRAZOLE SODIUM 40 MG IV SOLR
40.0000 mg | Freq: Once | INTRAVENOUS | Status: AC
  Administered 2024-02-15: 40 mg via INTRAVENOUS
  Filled 2024-02-15: qty 10

## 2024-02-15 MED ORDER — METOCLOPRAMIDE HCL 10 MG PO TABS: 10.0000 mg | ORAL_TABLET | Freq: Three times a day (TID) | ORAL | 0 refills | Status: AC | PRN

## 2024-02-15 MED ORDER — SODIUM CHLORIDE 0.9 % IV SOLN
2.0000 g | Freq: Once | INTRAVENOUS | Status: AC
  Administered 2024-02-15: 2 g via INTRAVENOUS
  Filled 2024-02-15: qty 20

## 2024-02-15 MED ORDER — CEPHALEXIN 500 MG PO CAPS: 500.0000 mg | ORAL_CAPSULE | Freq: Two times a day (BID) | ORAL | 0 refills | Status: DC

## 2024-02-15 MED ORDER — PROMETHAZINE (PHENERGAN) 6.25MG IN NS 50ML IVPB
6.2500 mg | Freq: Once | INTRAVENOUS | Status: AC
  Administered 2024-02-15: 6.25 mg via INTRAVENOUS
  Filled 2024-02-15: qty 50

## 2024-02-15 MED ORDER — SODIUM CHLORIDE 0.9 % IV BOLUS
1000.0000 mL | Freq: Once | INTRAVENOUS | Status: AC
  Administered 2024-02-15: 1000 mL via INTRAVENOUS

## 2024-02-15 NOTE — ED Provider Notes (Signed)
 [place] [facility]PARTMENT AT [facility] Provider Note   CSN: [id number] Arrival date & time: 02/15/24  1253     Patient presents with: Abdominal Pain and Weakness (general)   [name] is a 78 y.o. female.  She has a history of diabetes hypertension and CKD.  Complaining of of nausea and feeling sick on her stomach for the last 3 days.  Associated with hiccups and some discomfort in her chest abdomen from same.  Having nonbloody vomiting.  Has been trying some Pepto-Bismol along with trying to eat and drink a little but not very successful.  Has been urinating a lot.  Sugars usually 100-110, have been running high.  Recently increased her Jardiance .  No fever but has been feeling hot and cold.  Nonproductive cough.  No dysuria no diarrhea.  No sick contacts or recent travel.  {Add pertinent medical, surgical, social history, OB history to YEP:67052} The history is provided by the patient.  Emesis Severity:  Moderate Duration:  3 days Timing:  Intermittent Progression:  Unchanged Chronicity:  New Recent urination:  Increased Relieved by:  Nothing Associated symptoms: abdominal pain, chills, cough and fever   Associated symptoms: no diarrhea   Risk factors: diabetes   Risk factors: no sick contacts, no suspect food intake and no travel to endemic areas        Prior to Admission medications   Medication Sig Start Date End Date Taking? Authorizing Provider  ACCU-CHEK GUIDE test strip TESTING TWICE DAILYT 01/13/21   [provider]  Accu-Chek Softclix Lancets lancets 2 (two) times daily. 01/13/21   [provider]  acetaminophen  (TYLENOL ) 500 MG tablet Take 1,000 mg by mouth every 8 (eight) hours as needed for moderate pain.    [provider]  albuterol  (VENTOLIN  HFA) 108 (90 Base) MCG/ACT inhaler Inhale 1-2 puffs into the lungs every 6 (six) hours as needed for wheezing or shortness of breath. 12/21/22   Landy Barnie GORMAN, NP  apixaban   (ELIQUIS ) 5 MG TABS tablet Take 1 tablet (5 mg total) by mouth 2 (two) times daily. 10/03/23   Barbarann Dixon B, RPH-CPP  APIXABAN  (ELIQUIS ) VTE STARTER PACK (10MG  AND 5MG ) Take as directed on package: start with two-5mg  tablets twice daily for 7 days. On day 8, switch to one-5mg  tablet twice daily. 09/30/23   Kammerer, Megan L, DO  aspirin  81 MG tablet Take 81 mg by mouth every evening.     [provider]  bisoprolol  (ZEBETA ) 10 MG tablet Take 1 tablet (10 mg total) by mouth daily. 12/21/22   Landy Barnie GORMAN, NP  calcitRIOL (ROCALTROL) 0.25 MCG capsule Take 0.25 mcg by mouth daily.    [provider]  Cholecalciferol (VITAMIN D3) 125 MCG (5000 UT) CAPS Take 5,000 Units by mouth daily.    [provider]  HYDROcodone -acetaminophen  (NORCO/VICODIN) 5-325 MG tablet Take 1 tablet by mouth every 8 (eight) hours as needed (pain). 12/21/22   Landy Barnie GORMAN, NP  indomethacin (INDOCIN) 50 MG capsule Take 50 mg by mouth 3 (three) times daily. 11/13/23   [provider]  JARDIANCE  10 MG TABS tablet Take 1 tablet (10 mg total) by mouth every morning. 12/21/22   Landy Barnie GORMAN, NP  linagliptin  (TRADJENTA ) 5 MG TABS tablet Take 1 tablet (5 mg total) by mouth daily. 12/21/22   Landy Barnie GORMAN, NP  metFORMIN (GLUCOPHAGE-XR) 500 MG 24 hr tablet Take 500 mg by mouth 2 (two) times daily. 09/23/23   [provider]  NIFEdipine  (PROCARDIA  XL/NIFEDICAL-XL) 90 MG 24 hr tablet Take 1 tablet (90 mg total) by mouth daily. 12/21/22   Landy Barnie RAMAN, NP  Omega-3 Fatty Acids  (FISH OIL) 1200 MG CAPS Take 1,200 mg by mouth daily.    [provider]  pantoprazole  (PROTONIX ) 40 MG tablet Take 1 tablet (40 mg total) by mouth daily. 12/21/22   Landy Barnie RAMAN, NP  simvastatin  (ZOCOR ) 20 MG tablet Take 1 tablet (20 mg total) by mouth daily at 6 PM. 12/21/22   Landy, Barnie RAMAN, NP  zolpidem  (AMBIEN ) 10 MG tablet Take 1 tablet (10 mg total) by mouth at bedtime. 12/21/22   Landy Barnie RAMAN, NP    Allergies: Ace inhibitors, Celecoxib, Hydrocodone -acetaminophen , and Zofran  [ondansetron  hcl]    Review of Systems  Constitutional:  Positive for chills and fever.  Respiratory:  Positive for cough.   Cardiovascular:  Positive for chest pain.  Gastrointestinal:  Positive for abdominal pain and vomiting. Negative for diarrhea.  Genitourinary:  Positive for frequency. Negative for dysuria.    Updated Vital Signs BP 136/78   Pulse 82   Temp 98.6 F (37 C) (Oral)   Resp (!) 21   Ht 5' 9 (1.753 m)   Wt 77.1 kg   LMP  (LMP Unknown)   SpO2 97%   BMI 25.10 kg/m   Physical Exam Vitals and nursing note reviewed.  Constitutional:      General: She is not in acute distress.    Appearance: Normal appearance. She is well-developed.  HENT:     Head: Normocephalic and atraumatic.  Eyes:     Conjunctiva/sclera: Conjunctivae normal.  Cardiovascular:     Rate and Rhythm: Normal rate and regular rhythm.     Heart sounds: No murmur heard. Pulmonary:     Effort: Pulmonary effort is normal. No respiratory distress.     Breath sounds: Normal breath sounds.  Abdominal:     Palpations: Abdomen is soft.     Tenderness: There is no abdominal tenderness. There is no guarding or rebound.  Musculoskeletal:        General: No swelling.     Cervical back: Neck supple.  Skin:    General: Skin is warm and dry.     Capillary Refill: Capillary refill takes less than 2 seconds.  Neurological:     General: No focal deficit present.     Mental Status: She is alert.     Motor: No weakness.     (all labs ordered are listed, but only abnormal results are displayed) Labs Reviewed  CBC - Abnormal; Notable for the following components:      Result Value   RDW 15.8 (*)    All other components within normal limits  CBG MONITORING, ED - Abnormal; Notable for the following components:   Glucose-Capillary 384 (*)    All other components within normal limits  I-STAT CHEM 8, ED - Abnormal;  Notable for the following components:   Sodium 130 (*)    Chloride 97 (*)    BUN 45 (*)    Creatinine, Ser 2.80 (*)    Glucose, Bld 451 (*)    Hemoglobin 15.3 (*)    All other components within normal limits  RESP PANEL BY RT-PCR (RSV, FLU A&B, COVID)  RVPGX2  LIPASE, BLOOD  COMPREHENSIVE METABOLIC PANEL WITH GFR  URINALYSIS, ROUTINE W REFLEX MICROSCOPIC    EKG: None  Radiology: No results found.  {Document cardiac monitor, telemetry assessment procedure when appropriate:32947}  Procedures   Medications Ordered in the ED  sodium chloride  0.9 % bolus 1,000 mL (has no administration in time range)  promethazine  (PHENERGAN ) 6.25 mg/NS 50 mL IVPB (has no administration in time range)  pantoprazole  (PROTONIX ) injection 40 mg (has no administration in time range)      {Click here for ABCD2, HEART and other calculators REFRESH Note before signing:1}                              Medical Decision Making Amount and/or Complexity of Data Reviewed Labs: ordered. Radiology: ordered.  Risk Prescription drug management.   This patient complains of ***; this involves an extensive number of treatment Options and is a complaint that carries with it a high risk of complications and morbidity. The differential includes ***  I ordered, reviewed and interpreted labs, which included *** I ordered medication *** and reviewed PMP when indicated. I ordered imaging studies which included *** and I independently    visualized and interpreted imaging which showed *** Additional history obtained from *** Previous records obtained and reviewed *** I consulted *** and discussed lab and imaging findings and discussed disposition.  Cardiac monitoring reviewed, *** Social determinants considered, *** Critical Interventions: ***  After the interventions stated above, I reevaluated the patient and found *** Admission and further testing considered, ***   {Document critical care time when  appropriate  Document review of labs and clinical decision tools ie CHADS2VASC2, etc  Document your independent review of radiology images and any outside records  Document your discussion with family members, caretakers and with consultants  Document social determinants of health affecting pt's care  Document your decision making why or why not admission, treatments were needed:32947:::1}   Final diagnoses:  None    ED Discharge Orders     None

## 2024-02-15 NOTE — ED Triage Notes (Signed)
 Pt comes in for general weakness and abd pain, 'like my stomach is upset. Pt has been throwing up. Pt has tried Pedialyte, crackers, ginger ale but it seems like the pt is not getting any better. Pt has been peeing a lot. A&Ox4.  Pt has a lack of appetite. Pt cannot get rid of her hiccups.   Pt has diabetes and CKD.    Pt jardiance  has increased from 10 to 25 so she can get off of metformin early this week.

## 2024-02-15 NOTE — ED Notes (Signed)
 Amb to BR with cane, and stby asst.

## 2024-02-15 NOTE — Discharge Instructions (Addendum)
 You were seen in the Emergency Department for nausea vomiting poor appetite feeling weak.  Your workup showed you to be dehydrated and possibly have a urinary tract infection.  We discussed admission but you felt you could go home and take medication.  We are sending prescriptions for nausea medication and antibiotics to the pharmacy.  Please keep well-hydrated.  Follow-up with your primary care doctor.  Return to the Emergency Department if any worsening or concerning symptoms

## 2024-02-15 NOTE — ED Notes (Signed)
 Pt able to handle PO water  and crackers. No nausea or vomitting; pt states she just doesn't have an appetite.

## 2024-02-18 LAB — URINE CULTURE: Culture: >=100000 — AB

## 2024-02-19 ENCOUNTER — Telehealth (HOSPITAL_BASED_OUTPATIENT_CLINIC_OR_DEPARTMENT_OTHER): Payer: Self-pay | Admitting: Emergency Medicine

## 2024-02-19 NOTE — Telephone Encounter (Signed)
 Post ED Visit - Positive Culture Follow-up  Culture report reviewed by antimicrobial stewardship pharmacist: [facility] []  Rankin Dee, Pharm.D. []  Venetia Gully, Pharm.D., BCPS AQ-ID []  Garrel Crews, Pharm.D., BCPS []  Almarie Lunger, [address].D., BCPS []  [place], [address].D., BCPS, AAHIVP []  Rosaline Bihari, Pharm.D., BCPS, AAHIVP [x]  Vernell Meier, PharmD, BCPS []  Latanya Hint, PharmD, BCPS []  Donald Medley, PharmD, BCPS []  Rocky Bold, PharmD []  Dorothyann Alert, PharmD, BCPS []  Morene Babe, PharmD  [facility] []  Rosaline Edison, PharmD []  Romona Bliss, PharmD []  Dolphus Roller, PharmD []  Veva Seip, Rph []  Vernell Daunt) Leonce, PharmD []  Eva Allis, PharmD []  Rosaline Millet, PharmD []  Iantha Batch, PharmD []  Arvin Gauss, PharmD []  Wanda Hasting, PharmD []  Ronal Rav, PharmD []  Rocky Slade, PharmD []  Bard Jeans, PharmD   Positive urine culture Treated with Cephalexin, organism sensitive to the same and no further patient follow-up is required at this time.  Clotilda BROCKS Xinyi Batton 02/19/2024, 11:23 AM

## 2024-03-17 ENCOUNTER — Other Ambulatory Visit (HOSPITAL_COMMUNITY): Payer: Self-pay | Admitting: Nephrology

## 2024-03-17 DIAGNOSIS — N179 Acute kidney failure, unspecified: Secondary | ICD-10-CM

## 2024-03-17 DIAGNOSIS — N184 Chronic kidney disease, stage 4 (severe): Secondary | ICD-10-CM

## 2024-03-17 DIAGNOSIS — N2889 Other specified disorders of kidney and ureter: Secondary | ICD-10-CM

## 2024-03-17 DIAGNOSIS — D631 Anemia in chronic kidney disease: Secondary | ICD-10-CM

## 2024-03-17 DIAGNOSIS — N289 Disorder of kidney and ureter, unspecified: Secondary | ICD-10-CM

## 2024-03-23 ENCOUNTER — Other Ambulatory Visit (HOSPITAL_COMMUNITY): Payer: Self-pay | Admitting: Radiology

## 2024-03-24 ENCOUNTER — Other Ambulatory Visit (HOSPITAL_COMMUNITY): Payer: Self-pay | Admitting: Radiology

## 2024-03-24 ENCOUNTER — Other Ambulatory Visit (HOSPITAL_COMMUNITY): Payer: Self-pay | Admitting: Nephrology

## 2024-03-24 ENCOUNTER — Ambulatory Visit (HOSPITAL_COMMUNITY)
Admission: RE | Admit: 2024-03-24 | Discharge: 2024-03-24 | Disposition: A | Discharge status: Home / Self Care | Source: Ambulatory Visit | Attending: Nephrology | Admitting: Nephrology

## 2024-03-24 DIAGNOSIS — N179 Acute kidney failure, unspecified: Secondary | ICD-10-CM

## 2024-03-24 DIAGNOSIS — N2889 Other specified disorders of kidney and ureter: Secondary | ICD-10-CM

## 2024-03-24 DIAGNOSIS — N184 Chronic kidney disease, stage 4 (severe): Secondary | ICD-10-CM

## 2024-03-24 DIAGNOSIS — N289 Disorder of kidney and ureter, unspecified: Secondary | ICD-10-CM | POA: Insufficient documentation

## 2024-03-24 DIAGNOSIS — D631 Anemia in chronic kidney disease: Secondary | ICD-10-CM | POA: Diagnosis present

## 2024-04-10 ENCOUNTER — Inpatient Hospital Stay: Attending: Oncology | Admitting: Oncology

## 2024-04-10 ENCOUNTER — Inpatient Hospital Stay

## 2024-04-10 VITALS — BP 141/76 | HR 84 | Temp 98.4°F | Resp 16 | Wt 160.9 lb

## 2024-04-10 DIAGNOSIS — R7989 Other specified abnormal findings of blood chemistry: Secondary | ICD-10-CM | POA: Diagnosis not present

## 2024-04-10 DIAGNOSIS — Z7901 Long term (current) use of anticoagulants: Secondary | ICD-10-CM | POA: Insufficient documentation

## 2024-04-10 DIAGNOSIS — I82402 Acute embolism and thrombosis of unspecified deep veins of left lower extremity: Secondary | ICD-10-CM | POA: Insufficient documentation

## 2024-04-10 DIAGNOSIS — I824Z2 Acute embolism and thrombosis of unspecified deep veins of left distal lower extremity: Secondary | ICD-10-CM

## 2024-04-10 DIAGNOSIS — F17200 Nicotine dependence, unspecified, uncomplicated: Secondary | ICD-10-CM | POA: Diagnosis not present

## 2024-04-10 LAB — CBC WITH DIFFERENTIAL/PLATELET
Abs Immature Granulocytes: 0.1 K/uL — ABNORMAL HIGH (ref 0.00–0.07)
Basophils Absolute: 0.1 K/uL (ref 0.0–0.1)
Basophils Relative: 1 %
Eosinophils Absolute: 0.1 K/uL (ref 0.0–0.5)
Eosinophils Relative: 1 %
HCT: 38.1 % (ref 36.0–46.0)
Hemoglobin: 11.5 g/dL — ABNORMAL LOW (ref 12.0–15.0)
Immature Granulocytes: 1 %
Lymphocytes Relative: 23 %
Lymphs Abs: 1.7 K/uL (ref 0.7–4.0)
MCH: 27.1 pg (ref 26.0–34.0)
MCHC: 30.2 g/dL (ref 30.0–36.0)
MCV: 89.6 fL (ref 80.0–100.0)
Monocytes Absolute: 0.9 K/uL (ref 0.1–1.0)
Monocytes Relative: 12 %
Neutro Abs: 4.7 K/uL (ref 1.7–7.7)
Neutrophils Relative %: 62 %
Platelets: 360 K/uL (ref 150–400)
RBC: 4.25 MIL/uL (ref 3.87–5.11)
RDW: 16.6 % — ABNORMAL HIGH (ref 11.5–15.5)
WBC: 7.5 K/uL (ref 4.0–10.5)
nRBC: 0 % (ref 0.0–0.2)

## 2024-04-10 LAB — D-DIMER, QUANTITATIVE: D-Dimer, Quant: 0.81 ug{FEU}/mL — ABNORMAL HIGH (ref 0.00–0.50)

## 2024-04-10 NOTE — Progress Notes (Unsigned)
 " [facility] at [facility]  HEMATOLOGY NEW VISIT  [name], MD  REASON FOR REFERRAL: Venous thromboembolism  SUMMARY OF HEMATOLOGIC HISTORY: Date: 7 years ago 1st episode: Right leg DVT Risk factors: Recent fall, smoking Treatment: Coumadin   Date: 09/30/2023  Second episode: Left leg DVT Risk factors: Spine surgery in 12/2022 limiting mobility, recent 3-hour travel to the beach via car with 1 break, smoking, old-age Current treatment: Eliquis  5 mg twice daily  HISTORY OF PRESENT ILLNESS: [name] 79 y.o. female referred for thromboembolism.  Patient has a past medical history of CKD, hypertension, hyperlipidemia, diabetes.  She had a past medical history of right leg DVT several years ago after fall and took anticoagulation for a short period.  She reported that 2 days prior to the recent emergency visit on 09/30/2023 she was felt that her left leg was swollen and tight and felt like she had a blood clot from prior experience.  She reached out to primary care's office who told her to go to the ER.  She had an ultrasound done at that time showing an extensive left leg DVT.  She was then seen by vascular surgery for probable thrombectomy but patient refused a CT venogram as she was worried about worsening her CKD.  She was then offered medical management with Eliquis .  Patient today reports that she is feeling better with decreased swelling and tightness in her left leg.  She is not taking ESA shots for her chronic kidney disease.She smokes about a pack every couple of days and has been smoking for many years. She recently traveled to the beach in May, a three-hour trip with a rest stop break.  She reported that she had a spine surgery in September 2024 and since then her mobility has severely decreased.  She tries to walk around the house and do some chores but not as well as before.  She had a colonoscopy done in 2023 with polypectomy and no concerns for  cancer, she is up-to-date on mammogram and Pap smear and now out of range by age for any cancer screening.  Her son has a history of blood clots, which occurred while he was exercising. He had a clot in his leg and one that traveled to his lung. No hereditary cause was identified for his clots.  She denies shortness of breath at this time but reports cough that has worsened over the past week without hemoptysis.  He has COPD and uses an inhaler as needed.  She has no other complaints and is doing well at baseline.  I have reviewed the past medical history, past surgical history, social history and family history with the patient   ALLERGIES:  is allergic to ace inhibitors, ramipril, levofloxacin, celecoxib, hydrocodone -acetaminophen , and zofran  [ondansetron  hcl].  MEDICATIONS:  Current Outpatient Medications  Medication Sig Dispense Refill   ACCU-CHEK GUIDE test strip TESTING TWICE DAILYT     Accu-Chek Softclix Lancets lancets 2 (two) times daily.     acetaminophen  (TYLENOL ) 500 MG tablet Take 1,000 mg by mouth every 8 (eight) hours as needed for moderate pain.     albuterol  (VENTOLIN  HFA) 108 (90 Base) MCG/ACT inhaler Inhale 1-2 puffs into the lungs every 6 (six) hours as needed for wheezing or shortness of breath. 1 each 0   apixaban  (ELIQUIS ) 5 MG TABS tablet Take 1 tablet (5 mg total) by mouth 2 (two) times daily. 60 tablet 5   aspirin  81 MG tablet Take 81  mg by mouth every evening.      bisoprolol  (ZEBETA ) 10 MG tablet Take 1 tablet (10 mg total) by mouth daily. 30 tablet 0   calcitRIOL (ROCALTROL) 0.25 MCG capsule Take 0.25 mcg by mouth daily.     cephALEXin  (KEFLEX ) 500 MG capsule Take 1 capsule (500 mg total) by mouth 2 (two) times daily. 14 capsule 0   Cholecalciferol (VITAMIN D3) 125 MCG (5000 UT) CAPS Take 5,000 Units by mouth daily.     HYDROcodone -acetaminophen  (NORCO/VICODIN) 5-325 MG tablet Take 1 tablet by mouth every 8 (eight) hours as needed (pain). 5 tablet 0   indomethacin  (INDOCIN) 50 MG capsule Take 50 mg by mouth 3 (three) times daily.     JARDIANCE  10 MG TABS tablet Take 1 tablet (10 mg total) by mouth every morning. 30 tablet 0   metoCLOPramide  (REGLAN ) 10 MG tablet Take 1 tablet (10 mg total) by mouth every 8 (eight) hours as needed for nausea. 30 tablet 0   MOUNJARO 2.5 MG/0.5ML Pen SMARTSIG:2.5 Milligram(s) SUB-Q Once a Week     NIFEdipine  (PROCARDIA  XL/NIFEDICAL-XL) 90 MG 24 hr tablet Take 1 tablet (90 mg total) by mouth daily. 30 tablet 0   Omega-3 Fatty Acids  (FISH OIL) 1200 MG CAPS Take 1,200 mg by mouth daily.     pantoprazole  (PROTONIX ) 40 MG tablet Take 1 tablet (40 mg total) by mouth daily. 30 tablet 0   simvastatin  (ZOCOR ) 20 MG tablet Take 1 tablet (20 mg total) by mouth daily at 6 PM. 30 tablet 0   zolpidem  (AMBIEN ) 10 MG tablet Take 1 tablet (10 mg total) by mouth at bedtime. 30 tablet 0   No current facility-administered medications for this visit.     REVIEW OF SYSTEMS:   Constitutional: Denies fevers, chills or night sweats Eyes: Denies blurriness of vision Ears, nose, mouth, throat, and face: Denies mucositis or sore throat Respiratory: Denies cough, dyspnea or wheezes Cardiovascular: Denies palpitation, chest discomfort or lower extremity swelling Gastrointestinal:  Denies nausea, heartburn or change in bowel habits Skin: Denies abnormal skin rashes Lymphatics: Denies new lymphadenopathy or easy bruising Neurological:Denies numbness, tingling or new weaknesses Behavioral/Psych: Mood is stable, no new changes  All other systems were reviewed with the patient and are negative.  PHYSICAL EXAMINATION:   Vitals:   04/10/24 1141  BP: (!) 141/76  Pulse: 84  Resp: 16  Temp: 98.4 F (36.9 C)  SpO2: 100%    GENERAL:alert, no distress and comfortable SKIN: skin color, texture, turgor are normal, no rashes or significant lesions LYMPH:  no palpable lymphadenopathy in the cervical, axillary or inguinal LUNGS: clear to auscultation  and percussion with normal breathing effort HEART: regular rate & rhythm and no murmurs and no lower extremity edema ABDOMEN:abdomen soft, non-tender and normal bowel sounds Musculoskeletal:no cyanosis of digits and no clubbing, left leg slightly bigger than the right and is in compression stocking NEURO: alert & oriented x 3 with fluent speech  LABORATORY DATA:  I have reviewed the data as listed  Lab Results  Component Value Date   WBC 8.8 02/15/2024   NEUTROABS 4.2 09/30/2023   HGB 15.3 (H) 02/15/2024   HCT 45.0 02/15/2024   MCV 86.2 02/15/2024   PLT 340 02/15/2024      Chemistry      Component Value Date/Time   NA 130 (L) 02/15/2024 1506   K 4.3 02/15/2024 1506   CL 97 (L) 02/15/2024 1506   CO2 21 (L) 02/15/2024 1358   BUN 45 (  H) 02/15/2024 1506   CREATININE 2.80 (H) 02/15/2024 1506      Component Value Date/Time   CALCIUM 9.5 02/15/2024 1358   ALKPHOS 83 02/15/2024 1358   AST 13 (L) 02/15/2024 1358   ALT 14 02/15/2024 1358   BILITOT 0.8 02/15/2024 1358      Latest Reference Range & Units 09/30/23 18:31  D-Dimer, Quant 0.00 - 0.50 ug/mL-FEU 7.07 (H)  (H): Data is abnormally high  RADIOGRAPHIC STUDIES: I have personally reviewed the radiological images as listed and agreed with the findings in the report.  MR ABDOMEN WO CONTRAST CLINICAL DATA:  History provided by technologist Abnormal CT.  EXAM: MRI ABDOMEN WITHOUT CONTRAST  TECHNIQUE: Multiplanar multisequence MR imaging was performed without the administration of intravenous contrast.  COMPARISON:  CT scan abdomen pelvis from 02/15/2024.  FINDINGS: Lower chest: Unremarkable MR appearance to the lung bases. No pleural effusion. No pericardial effusion. Normal heart size.  Hepatobiliary: The liver is normal in size. Noncirrhotic configuration. No focal liver lesion. No intrahepatic or extrahepatic bile duct dilatation. No choledocholithiasis. Small volume dependent gallstones/sludge noted without  imaging signs of acute cholecystitis.  Pancreas: No mass, inflammatory changes or other parenchymal abnormality identified. No main pancreatic duct dilation.  Spleen:  Within normal limits in size and appearance. No focal mass.  Adrenals/Urinary Tract: Unremarkable right adrenal gland. There is a 9 x 19 mm adenoma arising from the inferior aspect of the lateral limb of the left adrenal gland. No hydroureteronephrosis. Limited evaluation of bilateral kidneys due to lack of intravenous contrast. There is at least moderately atrophic right kidney. There are multiple cystic structures with varying degrees of T2 intensity throughout bilateral kidneys. The largest structure arises from the right kidney interpolar region measuring up to 5.4 x 6.9 cm and exhibits slightly T1 hyperintense areas and subtle T2 shading. This is favored to represent a proteinaceous/hemorrhagic cyst. There are several additional proteinaceous/hemorrhagic cysts in bilateral kidneys with largest arising from the left kidney interpolar region measuring up to 3.8 x 4.1 cm.  There also several additional smaller simple cysts throughout bilateral kidneys. There is mild fat stranding surrounding the right kidney which is of unknown etiology and nonspecific.  Stomach/Bowel: Visualized portions within the abdomen are unremarkable. No disproportionate dilation of bowel loops.  Vascular/Lymphatic: No pathologically enlarged lymph nodes identified. No abdominal aortic aneurysm demonstrated. No ascites.  Other:  There is a tiny fat containing umbilical hernia.  Musculoskeletal: No suspicious bone lesions identified. Susceptibility artifact from lower lumbar spinal fixation hardware noted.  IMPRESSION: 1. There are multiple cystic structures in bilateral kidneys with varying degrees of T2 intensity and T1 hyperintensity. These are incompletely characterized on the current exam due to lack of intravenous contrast.  However, these are favored to represent proteinaceous/hemorrhagic cysts. 2. There is a 9 x 19 mm adenoma arising from the inferior aspect of the lateral limb of the left adrenal gland. 3. Cholelithiasis without imaging signs of acute cholecystitis. 4. Multiple other nonacute observations, as described above.  Electronically Signed   By: Ree Molt M.D.   On: 03/25/2024 11:37   ASSESSMENT & PLAN:  Patient is a 79 y.o. female referred for unprovoked DVT  Deep vein thrombosis (DVT) (HCC) Thrombosis history as above Patient has recurrent DVT, current episode likely unprovoked but probably triggered by prolonged decrease in mobility and contributed by smoking Considering this is a recurrent DVT, patient would require lifelong anticoagulation  - Continue Eliquis  5 mg twice daily.  Can discuss dropping this to a  prophylactic dose of 2.5 mg twice daily after 6 months considering patient's age and increased risk of bleeding. - Considering patient's age and comorbidities and the fact that it does not change treatment management, would not recommend thrombophilia testing at this time - Patient's D-dimer was significantly elevated correlating with the extent of DVT.  Can consider D-dimer based anticoagulation management at 6 months point, but considering the size of the thrombosis, I do not believe her D-dimer will be negative at the 6 months time  Return to clinic in 6 months to discuss further management    No orders of the defined types were placed in this encounter.   The total time spent in the appointment was 60 minutes encounter with patients including review of chart and various tests results, discussions about plan of care and coordination of care plan   All questions were answered. The patient knows to call the clinic with any problems, questions or concerns. No barriers to learning was detected.   Delon FORBES Hope, NP 1/9/202612:02 PM "

## 2024-04-10 NOTE — Assessment & Plan Note (Signed)
 Thrombosis history as above Patient has recurrent DVT, current episode likely unprovoked but probably triggered by prolonged decrease in mobility and contributed by smoking Considering this is a recurrent DVT, patient would require lifelong anticoagulation  - Continue Eliquis  5 mg twice daily.  Can discuss dropping this to a prophylactic dose of 2.5 mg twice daily after 6 months considering patient's age and increased risk of bleeding. - Considering patient's age and comorbidities and the fact that it does not change treatment management, would not recommend thrombophilia testing at this time - Patient's D-dimer was significantly elevated correlating with the extent of DVT.  Can consider D-dimer based anticoagulation management at 6 months point, but considering the size of the thrombosis, I do not believe her D-dimer will be negative at the 6 months time  Return to clinic in 6 months to discuss further management

## 2024-04-26 ENCOUNTER — Other Ambulatory Visit: Payer: Self-pay

## 2024-04-26 ENCOUNTER — Inpatient Hospital Stay (HOSPITAL_COMMUNITY)
Admission: EM | Admit: 2024-04-26 | Discharge: 2024-05-01 | DRG: 871 | Disposition: A | Discharge status: Skilled Nursing Facility | Attending: Family Medicine | Admitting: Family Medicine

## 2024-04-26 ENCOUNTER — Encounter (HOSPITAL_COMMUNITY): Payer: Self-pay

## 2024-04-26 ENCOUNTER — Emergency Department (HOSPITAL_COMMUNITY)

## 2024-04-26 DIAGNOSIS — Z885 Allergy status to narcotic agent status: Secondary | ICD-10-CM

## 2024-04-26 DIAGNOSIS — D631 Anemia in chronic kidney disease: Secondary | ICD-10-CM | POA: Diagnosis present

## 2024-04-26 DIAGNOSIS — E872 Acidosis, unspecified: Secondary | ICD-10-CM | POA: Diagnosis present

## 2024-04-26 DIAGNOSIS — A419 Sepsis, unspecified organism: Secondary | ICD-10-CM | POA: Diagnosis not present

## 2024-04-26 DIAGNOSIS — N184 Chronic kidney disease, stage 4 (severe): Secondary | ICD-10-CM | POA: Diagnosis present

## 2024-04-26 DIAGNOSIS — N179 Acute kidney failure, unspecified: Secondary | ICD-10-CM | POA: Diagnosis present

## 2024-04-26 DIAGNOSIS — J449 Chronic obstructive pulmonary disease, unspecified: Secondary | ICD-10-CM | POA: Diagnosis present

## 2024-04-26 DIAGNOSIS — M109 Gout, unspecified: Secondary | ICD-10-CM | POA: Insufficient documentation

## 2024-04-26 DIAGNOSIS — C642 Malignant neoplasm of left kidney, except renal pelvis: Secondary | ICD-10-CM | POA: Diagnosis present

## 2024-04-26 DIAGNOSIS — F17210 Nicotine dependence, cigarettes, uncomplicated: Secondary | ICD-10-CM | POA: Diagnosis present

## 2024-04-26 DIAGNOSIS — E1165 Type 2 diabetes mellitus with hyperglycemia: Secondary | ICD-10-CM | POA: Diagnosis present

## 2024-04-26 DIAGNOSIS — D689 Coagulation defect, unspecified: Secondary | ICD-10-CM | POA: Diagnosis present

## 2024-04-26 DIAGNOSIS — R509 Fever, unspecified: Principal | ICD-10-CM

## 2024-04-26 DIAGNOSIS — Z7984 Long term (current) use of oral hypoglycemic drugs: Secondary | ICD-10-CM

## 2024-04-26 DIAGNOSIS — K8020 Calculus of gallbladder without cholecystitis without obstruction: Secondary | ICD-10-CM | POA: Diagnosis present

## 2024-04-26 DIAGNOSIS — C641 Malignant neoplasm of right kidney, except renal pelvis: Secondary | ICD-10-CM | POA: Diagnosis present

## 2024-04-26 DIAGNOSIS — Z7982 Long term (current) use of aspirin: Secondary | ICD-10-CM

## 2024-04-26 DIAGNOSIS — D696 Thrombocytopenia, unspecified: Secondary | ICD-10-CM | POA: Diagnosis present

## 2024-04-26 DIAGNOSIS — R6521 Severe sepsis with septic shock: Secondary | ICD-10-CM | POA: Diagnosis present

## 2024-04-26 DIAGNOSIS — E875 Hyperkalemia: Secondary | ICD-10-CM | POA: Diagnosis present

## 2024-04-26 DIAGNOSIS — I10 Essential (primary) hypertension: Secondary | ICD-10-CM | POA: Diagnosis not present

## 2024-04-26 DIAGNOSIS — Z720 Tobacco use: Secondary | ICD-10-CM | POA: Diagnosis not present

## 2024-04-26 DIAGNOSIS — R54 Age-related physical debility: Secondary | ICD-10-CM | POA: Diagnosis present

## 2024-04-26 DIAGNOSIS — E1122 Type 2 diabetes mellitus with diabetic chronic kidney disease: Secondary | ICD-10-CM | POA: Diagnosis present

## 2024-04-26 DIAGNOSIS — K828 Other specified diseases of gallbladder: Secondary | ICD-10-CM | POA: Diagnosis present

## 2024-04-26 DIAGNOSIS — Z86718 Personal history of other venous thrombosis and embolism: Secondary | ICD-10-CM

## 2024-04-26 DIAGNOSIS — R0902 Hypoxemia: Secondary | ICD-10-CM | POA: Diagnosis present

## 2024-04-26 DIAGNOSIS — R531 Weakness: Secondary | ICD-10-CM

## 2024-04-26 DIAGNOSIS — R7989 Other specified abnormal findings of blood chemistry: Secondary | ICD-10-CM

## 2024-04-26 DIAGNOSIS — I129 Hypertensive chronic kidney disease with stage 1 through stage 4 chronic kidney disease, or unspecified chronic kidney disease: Secondary | ICD-10-CM | POA: Diagnosis present

## 2024-04-26 DIAGNOSIS — E860 Dehydration: Secondary | ICD-10-CM | POA: Diagnosis present

## 2024-04-26 DIAGNOSIS — E8809 Other disorders of plasma-protein metabolism, not elsewhere classified: Secondary | ICD-10-CM | POA: Diagnosis present

## 2024-04-26 DIAGNOSIS — R739 Hyperglycemia, unspecified: Secondary | ICD-10-CM

## 2024-04-26 DIAGNOSIS — Z794 Long term (current) use of insulin: Secondary | ICD-10-CM

## 2024-04-26 DIAGNOSIS — Z881 Allergy status to other antibiotic agents status: Secondary | ICD-10-CM

## 2024-04-26 DIAGNOSIS — Z823 Family history of stroke: Secondary | ICD-10-CM

## 2024-04-26 DIAGNOSIS — Z888 Allergy status to other drugs, medicaments and biological substances status: Secondary | ICD-10-CM

## 2024-04-26 DIAGNOSIS — R7881 Bacteremia: Secondary | ICD-10-CM | POA: Diagnosis not present

## 2024-04-26 DIAGNOSIS — Z66 Do not resuscitate: Secondary | ICD-10-CM | POA: Diagnosis present

## 2024-04-26 DIAGNOSIS — Z90710 Acquired absence of both cervix and uterus: Secondary | ICD-10-CM

## 2024-04-26 DIAGNOSIS — N10 Acute tubulo-interstitial nephritis: Secondary | ICD-10-CM | POA: Diagnosis present

## 2024-04-26 DIAGNOSIS — Z833 Family history of diabetes mellitus: Secondary | ICD-10-CM

## 2024-04-26 DIAGNOSIS — N2889 Other specified disorders of kidney and ureter: Secondary | ICD-10-CM

## 2024-04-26 DIAGNOSIS — K219 Gastro-esophageal reflux disease without esophagitis: Secondary | ICD-10-CM | POA: Diagnosis present

## 2024-04-26 DIAGNOSIS — Z7901 Long term (current) use of anticoagulants: Secondary | ICD-10-CM

## 2024-04-26 DIAGNOSIS — Z1152 Encounter for screening for COVID-19: Secondary | ICD-10-CM | POA: Diagnosis not present

## 2024-04-26 DIAGNOSIS — R6520 Severe sepsis without septic shock: Secondary | ICD-10-CM | POA: Diagnosis not present

## 2024-04-26 DIAGNOSIS — A4151 Sepsis due to Escherichia coli [E. coli]: Secondary | ICD-10-CM | POA: Diagnosis present

## 2024-04-26 DIAGNOSIS — E871 Hypo-osmolality and hyponatremia: Secondary | ICD-10-CM | POA: Diagnosis present

## 2024-04-26 DIAGNOSIS — B9620 Unspecified Escherichia coli [E. coli] as the cause of diseases classified elsewhere: Secondary | ICD-10-CM | POA: Diagnosis not present

## 2024-04-26 DIAGNOSIS — E119 Type 2 diabetes mellitus without complications: Secondary | ICD-10-CM | POA: Insufficient documentation

## 2024-04-26 DIAGNOSIS — Z8249 Family history of ischemic heart disease and other diseases of the circulatory system: Secondary | ICD-10-CM

## 2024-04-26 LAB — CBC WITH DIFFERENTIAL/PLATELET
Abs Immature Granulocytes: 2.8 10*3/uL — ABNORMAL HIGH (ref 0.00–0.07)
Band Neutrophils: 9 %
Basophils Absolute: 0 10*3/uL (ref 0.0–0.1)
Basophils Relative: 0 %
Eosinophils Absolute: 0 10*3/uL (ref 0.0–0.5)
Eosinophils Relative: 0 %
HCT: 32.8 % — ABNORMAL LOW (ref 36.0–46.0)
Hemoglobin: 10.3 g/dL — ABNORMAL LOW (ref 12.0–15.0)
Lymphocytes Relative: 4 %
Lymphs Abs: 0.8 10*3/uL (ref 0.7–4.0)
MCH: 27.5 pg (ref 26.0–34.0)
MCHC: 31.4 g/dL (ref 30.0–36.0)
MCV: 87.7 fL (ref 80.0–100.0)
Metamyelocytes Relative: 1 %
Monocytes Absolute: 0.2 10*3/uL (ref 0.1–1.0)
Monocytes Relative: 1 %
Myelocytes: 12 %
Neutro Abs: 16 10*3/uL — ABNORMAL HIGH (ref 1.7–7.7)
Neutrophils Relative %: 72 %
Platelets: 177 10*3/uL (ref 150–400)
Promyelocytes Relative: 1 %
RBC: 3.74 MIL/uL — ABNORMAL LOW (ref 3.87–5.11)
RDW: 17 % — ABNORMAL HIGH (ref 11.5–15.5)
Smear Review: NORMAL
WBC: 19.7 10*3/uL — ABNORMAL HIGH (ref 4.0–10.5)
nRBC: 0.3 % — ABNORMAL HIGH (ref 0.0–0.2)

## 2024-04-26 LAB — COMPREHENSIVE METABOLIC PANEL WITH GFR
ALT: 13 U/L (ref 0–44)
AST: 32 U/L (ref 15–41)
Albumin: 3.3 g/dL — ABNORMAL LOW (ref 3.5–5.0)
Alkaline Phosphatase: 110 U/L (ref 38–126)
Anion gap: 18 — ABNORMAL HIGH (ref 5–15)
BUN: 55 mg/dL — ABNORMAL HIGH (ref 8–23)
CO2: 15 mmol/L — ABNORMAL LOW (ref 22–32)
Calcium: 8.7 mg/dL — ABNORMAL LOW (ref 8.9–10.3)
Chloride: 99 mmol/L (ref 98–111)
Creatinine, Ser: 5.66 mg/dL — ABNORMAL HIGH (ref 0.44–1.00)
GFR, Estimated: 7 mL/min — ABNORMAL LOW
Glucose, Bld: 270 mg/dL — ABNORMAL HIGH (ref 70–99)
Potassium: 5 mmol/L (ref 3.5–5.1)
Sodium: 131 mmol/L — ABNORMAL LOW (ref 135–145)
Total Bilirubin: 0.4 mg/dL (ref 0.0–1.2)
Total Protein: 6.6 g/dL (ref 6.5–8.1)

## 2024-04-26 LAB — PROTIME-INR
INR: 2 — ABNORMAL HIGH (ref 0.8–1.2)
Prothrombin Time: 23.6 s — ABNORMAL HIGH (ref 11.4–15.2)

## 2024-04-26 LAB — RESP PANEL BY RT-PCR (RSV, FLU A&B, COVID)  RVPGX2
Influenza A by PCR: NEGATIVE
Influenza B by PCR: NEGATIVE
Resp Syncytial Virus by PCR: NEGATIVE
SARS Coronavirus 2 by RT PCR: NEGATIVE

## 2024-04-26 LAB — LACTIC ACID, PLASMA
Lactic Acid, Venous: 3.3 mmol/L (ref 0.5–1.9)
Lactic Acid, Venous: 2.7 mmol/L (ref 0.5–1.9)

## 2024-04-26 MED ORDER — SODIUM CHLORIDE 0.9 % IV SOLN: INTRAVENOUS | Status: DC

## 2024-04-26 MED ORDER — LACTATED RINGERS IV BOLUS (SEPSIS)
1000.0000 mL | Freq: Once | INTRAVENOUS | Status: AC
  Administered 2024-04-26: 1000 mL via INTRAVENOUS

## 2024-04-26 MED ORDER — BISACODYL 5 MG PO TBEC: 5.0000 mg | DELAYED_RELEASE_TABLET | Freq: Every day | ORAL | Status: DC | PRN

## 2024-04-26 MED ORDER — SODIUM CHLORIDE 0.9% FLUSH
3.0000 mL | Freq: Two times a day (BID) | INTRAVENOUS | Status: DC
  Administered 2024-04-27 – 2024-04-30 (×6): 3 mL via INTRAVENOUS

## 2024-04-26 MED ORDER — SODIUM CHLORIDE 0.9 % IV SOLN
2.0000 g | Freq: Once | INTRAVENOUS | Status: AC
  Administered 2024-04-26: 2 g via INTRAVENOUS
  Filled 2024-04-26: qty 12.5

## 2024-04-26 MED ORDER — CALCITRIOL 0.25 MCG PO CAPS
0.2500 ug | ORAL_CAPSULE | Freq: Every day | ORAL | Status: DC
  Administered 2024-04-27 – 2024-04-30 (×4): 0.25 ug via ORAL
  Filled 2024-04-26 (×4): qty 1

## 2024-04-26 MED ORDER — SODIUM CHLORIDE 0.9 % IV SOLN
1.0000 g | INTRAVENOUS | Status: DC
  Administered 2024-04-27: 1 g via INTRAVENOUS
  Filled 2024-04-26: qty 10

## 2024-04-26 MED ORDER — LACTATED RINGERS IV BOLUS
1000.0000 mL | Freq: Once | INTRAVENOUS | Status: AC
  Administered 2024-04-26: 1000 mL via INTRAVENOUS

## 2024-04-26 MED ORDER — METRONIDAZOLE 500 MG/100ML IV SOLN
500.0000 mg | Freq: Once | INTRAVENOUS | Status: AC
  Administered 2024-04-26: 500 mg via INTRAVENOUS
  Filled 2024-04-26: qty 100

## 2024-04-26 MED ORDER — APIXABAN 5 MG PO TABS
5.0000 mg | ORAL_TABLET | Freq: Two times a day (BID) | ORAL | Status: DC
  Administered 2024-04-26 – 2024-04-29 (×6): 5 mg via ORAL
  Filled 2024-04-26 (×6): qty 1

## 2024-04-26 MED ORDER — LACTATED RINGERS IV SOLN: INTRAVENOUS | Status: DC

## 2024-04-26 MED ORDER — ACETAMINOPHEN 500 MG PO TABS
1000.0000 mg | ORAL_TABLET | Freq: Once | ORAL | Status: AC
  Administered 2024-04-26: 1000 mg via ORAL
  Filled 2024-04-26: qty 2

## 2024-04-26 MED ORDER — PANTOPRAZOLE SODIUM 40 MG PO TBEC
40.0000 mg | DELAYED_RELEASE_TABLET | Freq: Every day | ORAL | Status: DC
  Administered 2024-04-27 – 2024-04-30 (×4): 40 mg via ORAL
  Filled 2024-04-26 (×4): qty 1

## 2024-04-26 NOTE — Consult Note (Signed)
 CODE SEPSIS - PHARMACY COMMUNICATION  **Broad Spectrum Antibiotics should be administered within 1 hour of Sepsis diagnosis**  Time Code Sepsis Called/Page Received: 1735  Antibiotics Ordered: cefepime , flagyl   Time of 1st antibiotic administration: 1754  Additional action taken by pharmacy: n/a  If necessary, Name of Provider/Nurse Contacted: n/a    Sidda Humm A Yancy Knoble ,PharmD Clinical Pharmacist  04/26/2024  5:39 PM

## 2024-04-26 NOTE — ED Triage Notes (Signed)
 Pt BIB [facility] from home C/O generalized weakness X 2-3 days. Emesis since yesterday. C/O back, L flank pain.  CBG 363  99.6 100/58 111 96%  20g RAC NS 650 mL en route

## 2024-04-26 NOTE — ED Notes (Signed)
 Pt transported to CT via stretcher.

## 2024-04-26 NOTE — Progress Notes (Signed)
 Pt being followed by [facility] for Sepsis protocol.

## 2024-04-26 NOTE — ED Provider Notes (Signed)
 " [facility] Provider Note   CSN: [id number] Arrival date & time: 04/26/24  1713     Patient presents with: Flank Pain   [name] is a 79 y.o. female.  {Add pertinent medical, surgical, social history, OB history to HPI:32947} Pt c/o left flank pain in past 2-3 days. Also noted to have fever here of 104. +non prod cough. No dysuria or hematuria. Left flank pain dull, non radiating, without specific exacerbating or alleviating factors. Decreased appetite. Had bm yesterday. No headache. No neck pain/stiffness. No sore throat. No chest pain or discomfort. No sob. No extremity pain or swelling. Pt limited historian.   The history is provided by the patient, the spouse, the EMS personnel and medical records. The history is limited by the condition of the patient.  Flank Pain Associated symptoms include abdominal pain. Pertinent negatives include no chest pain, no headaches and no shortness of breath.       Prior to Admission medications  Medication Sig Start Date End Date Taking? Authorizing Provider  ACCU-CHEK GUIDE test strip TESTING TWICE DAILYT 01/13/21   [provider]  Accu-Chek Softclix Lancets lancets 2 (two) times daily. 01/13/21   [provider]  acetaminophen  (TYLENOL ) 500 MG tablet Take 1,000 mg by mouth every 8 (eight) hours as needed for moderate pain.    [provider]  albuterol  (VENTOLIN  HFA) 108 (90 Base) MCG/ACT inhaler Inhale 1-2 puffs into the lungs every 6 (six) hours as needed for wheezing or shortness of breath. 12/21/22   Landy Barnie GORMAN, NP  apixaban  (ELIQUIS ) 5 MG TABS tablet Take 1 tablet (5 mg total) by mouth 2 (two) times daily. 10/03/23   Barbarann Dixon B, RPH-CPP  aspirin  81 MG tablet Take 81 mg by mouth every evening.     [provider]  bisoprolol  (ZEBETA ) 10 MG tablet Take 1 tablet (10 mg total) by mouth daily. 12/21/22   Landy Barnie GORMAN, NP  calcitRIOL  (ROCALTROL )  0.25 MCG capsule Take 0.25 mcg by mouth daily.    [provider]  cephALEXin  (KEFLEX ) 500 MG capsule Take 1 capsule (500 mg total) by mouth 2 (two) times daily. 02/15/24   Towana Ozell BROCKS, MD  Cholecalciferol (VITAMIN D3) 125 MCG (5000 UT) CAPS Take 5,000 Units by mouth daily.    [provider]  HYDROcodone -acetaminophen  (NORCO/VICODIN) 5-325 MG tablet Take 1 tablet by mouth every 8 (eight) hours as needed (pain). 12/21/22   Landy Barnie GORMAN, NP  indomethacin (INDOCIN) 50 MG capsule Take 50 mg by mouth 3 (three) times daily. 11/13/23   [provider]  JARDIANCE  10 MG TABS tablet Take 1 tablet (10 mg total) by mouth every morning. 12/21/22   Landy Barnie GORMAN, NP  metoCLOPramide  (REGLAN ) 10 MG tablet Take 1 tablet (10 mg total) by mouth every 8 (eight) hours as needed for nausea. 02/15/24   Towana Ozell BROCKS, MD  MOUNJARO 2.5 MG/0.5ML Pen SMARTSIG:2.5 Milligram(s) SUB-Q Once a Week    [provider]  NIFEdipine  (PROCARDIA  XL/NIFEDICAL-XL) 90 MG 24 hr tablet Take 1 tablet (90 mg total) by mouth daily. 12/21/22   Landy Barnie GORMAN, NP  Omega-3 Fatty Acids  (FISH OIL) 1200 MG CAPS Take 1,200 mg by mouth daily.    [provider]  pantoprazole  (PROTONIX ) 40 MG tablet Take 1 tablet (40 mg total) by mouth daily. 12/21/22   Landy Barnie GORMAN, NP  simvastatin  (ZOCOR ) 20 MG tablet Take 1 tablet (20 mg total)  by mouth daily at 6 PM. 12/21/22   Landy, Barnie RAMAN, NP  zolpidem  (AMBIEN ) 10 MG tablet Take 1 tablet (10 mg total) by mouth at bedtime. 12/21/22   Landy Barnie RAMAN, NP    Allergies: Ace inhibitors, Ramipril, Levofloxacin, Celecoxib, Hydrocodone -acetaminophen , and Zofran  [ondansetron  hcl]    Review of Systems  Constitutional:  Positive for fever.  HENT:  Negative for sinus pain and sore throat.   Eyes:  Negative for pain, redness and visual disturbance.  Respiratory:  Positive for cough. Negative for shortness of breath.   Cardiovascular:  Negative for chest  pain and leg swelling.  Gastrointestinal:  Positive for abdominal pain. Negative for diarrhea and vomiting.  Genitourinary:  Positive for flank pain. Negative for dysuria, vaginal bleeding and vaginal discharge.  Musculoskeletal:  Negative for back pain and neck pain.  Skin:  Negative for rash.  Neurological:  Negative for headaches.    Updated Vital Signs BP 114/60   Pulse (!) 107   Temp (!) 104.4 F (40.2 C) (Rectal)   Resp (!) 35   Wt 73.5 kg   LMP  (LMP Unknown)   SpO2 98%   BMI 23.92 kg/m   Physical Exam Vitals and nursing note reviewed.  Constitutional:      Appearance: Normal appearance. She is well-developed.  HENT:     Head: Atraumatic.     Nose: Nose normal.     Mouth/Throat:     Mouth: Mucous membranes are moist.     Pharynx: Oropharynx is clear.  Eyes:     General: No scleral icterus.    Conjunctiva/sclera: Conjunctivae normal.     Pupils: Pupils are equal, round, and reactive to light.  Neck:     Trachea: No tracheal deviation.     Comments: Trachea midline, thyroid  not grossly enlarged or tender. No neck stiffness or rigidity.  Cardiovascular:     Rate and Rhythm: Regular rhythm. Tachycardia present.     Pulses: Normal pulses.     Heart sounds: Normal heart sounds. No murmur heard.    No friction rub. No gallop.  Pulmonary:     Effort: Pulmonary effort is normal. No respiratory distress.     Breath sounds: Normal breath sounds.  Abdominal:     General: Bowel sounds are normal. There is no distension.     Palpations: Abdomen is soft. There is no mass.     Tenderness: There is abdominal tenderness. There is no guarding.     Comments: Left abd tenderness and left cva tenderness.   Genitourinary:    Comments: No cva tenderness.  Musculoskeletal:        General: No swelling.     Cervical back: Normal range of motion and neck supple. No rigidity. No muscular tenderness.     Comments: T/L/S spine non tender, aligned.   Skin:    General: Skin is warm and  dry.     Findings: No rash.     Comments: No sts or skin lesions/rash in area of pain.   Neurological:     Mental Status: She is alert.     Comments: Alert, speech normal. Motor/sens grossly intact bil.   Psychiatric:        Mood and Affect: Mood normal.     (all labs ordered are listed, but only abnormal results are displayed) Results for orders placed or performed during the hospital encounter of 04/26/24  Culture, blood (Routine x 2)   Collection Time: 04/26/24  5:40 PM   Specimen:  BLOOD RIGHT FOREARM  Result Value Ref Range   Specimen Description      BLOOD RIGHT FOREARM BOTTLES DRAWN AEROBIC AND ANAEROBIC   Special Requests      Blood Culture results may not be optimal due to an inadequate volume of blood received in culture bottles Performed at [facility], [address], [place], [place] [address]    Culture PENDING    Report Status PENDING   Culture, blood (Routine x 2)   Collection Time: 04/26/24  5:42 PM   Specimen: Left Antecubital; Blood  Result Value Ref Range   Specimen Description      LEFT ANTECUBITAL BOTTLES DRAWN AEROBIC AND ANAEROBIC   Special Requests      Blood Culture adequate volume Performed at [facility], [address], [place], [place] [address]    Culture PENDING    Report Status PENDING   Comprehensive metabolic panel   Collection Time: 04/26/24  5:42 PM  Result Value Ref Range   Sodium 131 (L) 135 - 145 mmol/L   Potassium 5.0 3.5 - 5.1 mmol/L   Chloride 99 98 - 111 mmol/L   CO2 15 (L) 22 - 32 mmol/L   Glucose, Bld 270 (H) 70 - 99 mg/dL   BUN 55 (H) 8 - 23 mg/dL   Creatinine, Ser 4.33 (H) 0.44 - 1.00 mg/dL   Calcium  8.7 (L) 8.9 - 10.3 mg/dL   Total Protein 6.6 6.5 - 8.1 g/dL   Albumin  3.3 (L) 3.5 - 5.0 g/dL   AST 32 15 - 41 U/L   ALT 13 0 - 44 U/L   Alkaline Phosphatase 110 38 - 126 U/L   Total Bilirubin 0.4 0.0 - 1.2 mg/dL   GFR, Estimated 7 (L) >60 mL/min   Anion gap 18 (H) 5 - 15  Lactic acid, plasma   Collection Time:  04/26/24  5:42 PM  Result Value Ref Range   Lactic Acid, Venous 3.3 (HH) 0.5 - 1.9 mmol/L  CBC with Differential   Collection Time: 04/26/24  5:42 PM  Result Value Ref Range   WBC 19.7 (H) 4.0 - 10.5 K/uL   RBC 3.74 (L) 3.87 - 5.11 MIL/uL   Hemoglobin 10.3 (L) 12.0 - 15.0 g/dL   HCT 67.1 (L) 63.9 - 53.9 %   MCV 87.7 80.0 - 100.0 fL   MCH 27.5 26.0 - 34.0 pg   MCHC 31.4 30.0 - 36.0 g/dL   RDW 82.9 (H) 88.4 - 84.4 %   Platelets 177 150 - 400 K/uL   nRBC 0.3 (H) 0.0 - 0.2 %   Neutrophils Relative % 72 %   Neutro Abs 16.0 (H) 1.7 - 7.7 K/uL   Band Neutrophils 9 %   Lymphocytes Relative 4 %   Lymphs Abs 0.8 0.7 - 4.0 K/uL   Monocytes Relative 1 %   Monocytes Absolute 0.2 0.1 - 1.0 K/uL   Eosinophils Relative 0 %   Eosinophils Absolute 0.0 0.0 - 0.5 K/uL   Basophils Relative 0 %   Basophils Absolute 0.0 0.0 - 0.1 K/uL   RBC Morphology MORPHOLOGY UNREMARKABLE    Smear Review Normal platelet morphology    Metamyelocytes Relative 1 %   Myelocytes 12 %   Promyelocytes Relative 1 %   Abs Immature Granulocytes 2.80 (H) 0.00 - 0.07 K/uL  Protime-INR   Collection Time: 04/26/24  5:42 PM  Result Value Ref Range   Prothrombin Time 23.6 (H) 11.4 - 15.2 seconds   INR 2.0 (H) 0.8 -  1.2  Resp panel by RT-PCR (RSV, Flu A&B, Covid) Anterior Nasal Swab   Collection Time: 04/26/24  5:47 PM   Specimen: Anterior Nasal Swab  Result Value Ref Range   SARS Coronavirus 2 by RT PCR NEGATIVE NEGATIVE   Influenza A by PCR NEGATIVE NEGATIVE   Influenza B by PCR NEGATIVE NEGATIVE   Resp Syncytial Virus by PCR NEGATIVE NEGATIVE      EKG: EKG Interpretation Date/Time:  Sunday April 26 2024 17:19:38 EST Ventricular Rate:  111 PR Interval:  139 QRS Duration:  81 QT Interval:  315 QTC Calculation: 428 R Axis:   47  Text Interpretation: Sinus tachycardia Confirmed by Bernard Drivers ([id number]) on 04/26/2024 5:28:42 PM  Radiology: CT ABDOMEN PELVIS WO CONTRAST Result Date: 04/26/2024 EXAM: CT  ABDOMEN AND PELVIS WITHOUT CONTRAST 04/26/2024 06:48:20 PM TECHNIQUE: CT of the abdomen and pelvis was performed without the administration of intravenous contrast. Multiplanar reformatted images are provided for review. Automated exposure control, iterative reconstruction, and/or weight-based adjustment of the mA/kV was utilized to reduce the radiation dose to as low as reasonably achievable. COMPARISON: None available. CLINICAL HISTORY: Sepsis. Sepsis. FINDINGS: LOWER CHEST: No acute abnormality. LIVER: The liver is unremarkable. GALLBLADDER AND BILE DUCTS: Calcified gallstones within the gallbladder lumen. Question gallbladder fundus wall thickening. No pericholecystic fluid. No biliary ductal dilatation. SPLEEN: No acute abnormality. PANCREAS: No acute abnormality. ADRENAL GLANDS: No acute abnormality. KIDNEYS, URETERS AND BLADDER: Painful increase in size of a heterogeneous 5.2 cm left renal mass with a density of 37 Hounsfield units. Grossly stable right renal mass measuring 6.1 x 6.9 cm with a density of 48 Hounsfield units. Atrophic right kidney. Nonspecific bilateral perinephric fat stranding. No nephroureterolithiasis. No hydroureteronephrosis. Urinary bladder is unremarkable. GI AND BOWEL: Stomach demonstrates no acute abnormality. No small or large bowel thickening or dilatation. The appendix is unremarkable. Colonic diverticulosis. There is no bowel obstruction. PERITONEUM AND RETROPERITONEUM: No ascites. No free air. VASCULATURE: Aorta is normal in caliber. Severe atherosclerotic plaque. LYMPH NODES: No lymphadenopathy. REPRODUCTIVE ORGANS: No acute abnormality. BONES AND SOFT TISSUES: L4 to S1 posterolateral interbody surgical hardware. Effusion. Stable grade 1 anterolisthesis of L5 on S1. Multilevel mild-to-moderate degenerative changes of the spine. Other findings suggestive of anemia. No acute osseous abnormality. No focal soft tissue abnormality. IMPRESSION: 1. Nonspecific bilateral perinephric  fat stranding without nephroureterolithiasis or hydroureteronephrosis; pyelonephritis is not excluded. 2. Cholelithiasis with possible gallbladder fundus wall thickening and no pericholecystic fluid; consider right upper quadrant ultrasound if clinically indicated. 3. Enlarging heterogeneous 5.2 cm left renal mass, concerning for neoplasm; recommend urology consultation and renal protocol MRI or CT without and with IV contrast for further characterization. 4. Grossly stable 6.1 x 6.9 cm right renal mass, concerning for neoplasm; recommend urology consultation and renal protocol MRI or CT without and with IV contrast for further characterization. 5. Severe atherosclerotic plaque. Electronically signed by: Morgane Naveau MD 04/26/2024 06:54 PM EST RP Workstation: [id number]   DG Chest Port 1 View if patient is in a treatment room. Result Date: 04/26/2024 EXAM: 1 VIEW(S) XRAY OF THE CHEST 04/26/2024 05:34:00 PM COMPARISON: 02/15/2024 CLINICAL HISTORY: Suspected sepsis. FINDINGS: LUNGS AND PLEURA: Lower inspiration than previously. Normal lung volumes and mediastinal configuration, allowing for this. No focal pulmonary opacity. No pleural effusion. No pneumothorax. HEART AND MEDIASTINUM: No acute abnormality of the cardiac and mediastinal silhouettes. BONES AND SOFT TISSUES: Degenerative changes in the spine and shoulders. Postoperative changes in the cervical spine. IMPRESSION: 1. No acute findings. Electronically signed by: Elsie Gravely  MD 04/26/2024 05:41 PM EST RP Workstation: [id number]    {Document cardiac monitor, telemetry assessment procedure when appropriate:32947} Procedures   Medications Ordered in the ED  lactated ringers  infusion (has no administration in time range)  metroNIDAZOLE  (FLAGYL ) IVPB 500 mg (500 mg Intravenous New Bag/Given 04/26/24 1807)  lactated ringers  bolus 1,000 mL (0 mLs Intravenous Stopped 04/26/24 1853)  ceFEPIme  (MAXIPIME ) 2 g in sodium chloride  0.9 % 100 mL IVPB (0 g  Intravenous Stopped 04/26/24 1806)  acetaminophen  (TYLENOL ) tablet 1,000 mg (1,000 mg Oral Given 04/26/24 1804)  lactated ringers  bolus 1,000 mL (1,000 mLs Intravenous New Bag/Given 04/26/24 1859)      {Click here for ABCD2, HEART and other calculators REFRESH Note before signing:1}                              Medical Decision Making Problems Addressed: Acute febrile illness: acute illness or injury with systemic symptoms that poses a threat to life or bodily functions AKI (acute kidney injury): acute illness or injury with systemic symptoms that poses a threat to life or bodily functions Bilateral kidney masses: chronic illness or injury with exacerbation, progression, or side effects of treatment that poses a threat to life or bodily functions Dehydration: acute illness or injury with systemic symptoms that poses a threat to life or bodily functions Elevated lactic acid level: acute illness or injury Gallstones: chronic illness or injury Generalized weakness: acute illness or injury with systemic symptoms that poses a threat to life or bodily functions Sepsis due to urinary tract infection (HCC): acute illness or injury with systemic symptoms that poses a threat to life or bodily functions Stage 4 chronic kidney disease (HCC): chronic illness or injury with exacerbation, progression, or side effects of treatment that poses a threat to life or bodily functions  Amount and/or Complexity of Data Reviewed Independent Historian: spouse and EMS    Details: hx External Data Reviewed: labs, radiology and notes. Labs: ordered. Decision-making details documented in ED Course. Radiology: ordered and independent interpretation performed. Decision-making details documented in ED Course. ECG/medicine tests: ordered and independent interpretation performed. Decision-making details documented in ED Course. Discussion of management or test interpretation with external provider(s): medicine  Risk OTC  drugs. Prescription drug management. Decision regarding hospitalization.   Iv ns. Continuous pulse ox and cardiac monitoring. Labs ordered/sent. Imaging ordered.   Differential diagnosis includes sepsis, pyelo, pna, viral syndrome, etc. Dispo decision including potential need for admission considered - will get labs and imaging and reassess.   Reviewed nursing notes and prior charts for additional history. External reports reviewed. Additional history from: spouse EMS.  Cardiac monitor: sinus rhythm, rate 110.  Cultures sent. Iv abx.   Labs reviewed/interpreted by me - wbc 19, hgb 10, chem w aki, k is normal. Ua pending (RN indicates is grossly cloudy, milky).  Initial lactate high. Cxs sent, iv abx. Additional ivf bolus ordered.   Xrays reviewed/interpreted by me - no pna.   CT reviewed/interpreted by me - ?pyelo, no obstructing ureteral stone.   Recheck pt, bp normal. Hr mildly improved. Recheck abd soft, no change from initial.   Update patient/spouse on test results and plan, additional discussion. Additional prior records reviewed.   Hospitalists consulted for admission. Discussed pt, will admit.   Additional rechecks, bp ok. Resp status stable as compared to initial.   CRITICAL CARE RE: sepsis/urosepsis, aki, elevated lactate.  Performed by: Khali Perella E Navah Grondin Total critical care time:  110 minutes Critical care time was exclusive of separately billable procedures and treating other patients. Critical care was necessary to treat or prevent imminent or life-threatening deterioration. Critical care was time spent personally by me on the following activities: development of treatment plan with patient and/or surrogate as well as nursing, discussions with consultants, evaluation of patient's response to treatment, examination of patient, obtaining history from patient or surrogate, ordering and performing treatments and interventions, ordering and review of laboratory studies, ordering  and review of radiographic studies, pulse oximetry and re-evaluation of patient's condition.    {Document critical care time when appropriate  Document review of labs and clinical decision tools ie CHADS2VASC2, etc  Document your independent review of radiology images and any outside records  Document your discussion with family members, caretakers and with consultants  Document social determinants of health affecting pt's care  Document your decision making why or why not admission, treatments were needed:32947:::1}   Final diagnoses:  None    ED Discharge Orders     None        "
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
## 2024-04-26 NOTE — ED Notes (Signed)
 Patient was assisted to the bathroom and back to bed in room. Patient repositioned.

## 2024-04-27 ENCOUNTER — Inpatient Hospital Stay (HOSPITAL_COMMUNITY)

## 2024-04-27 ENCOUNTER — Other Ambulatory Visit: Payer: Self-pay

## 2024-04-27 DIAGNOSIS — N10 Acute tubulo-interstitial nephritis: Secondary | ICD-10-CM | POA: Diagnosis not present

## 2024-04-27 DIAGNOSIS — N184 Chronic kidney disease, stage 4 (severe): Secondary | ICD-10-CM | POA: Diagnosis not present

## 2024-04-27 DIAGNOSIS — N179 Acute kidney failure, unspecified: Secondary | ICD-10-CM | POA: Diagnosis not present

## 2024-04-27 LAB — BLOOD CULTURE ID PANEL (REFLEXED) - BCID2

## 2024-04-27 LAB — RENAL FUNCTION PANEL
Albumin: 2.8 g/dL — ABNORMAL LOW (ref 3.5–5.0)
Anion gap: 20 — ABNORMAL HIGH (ref 5–15)
BUN: 64 mg/dL — ABNORMAL HIGH (ref 8–23)
CO2: 13 mmol/L — ABNORMAL LOW (ref 22–32)
Calcium: 8.5 mg/dL — ABNORMAL LOW (ref 8.9–10.3)
Chloride: 99 mmol/L (ref 98–111)
Creatinine, Ser: 6.48 mg/dL — ABNORMAL HIGH (ref 0.44–1.00)
GFR, Estimated: 6 mL/min — ABNORMAL LOW
Glucose, Bld: 145 mg/dL — ABNORMAL HIGH (ref 70–99)
Phosphorus: 3.2 mg/dL (ref 2.5–4.6)
Potassium: 5.6 mmol/L — ABNORMAL HIGH (ref 3.5–5.1)
Sodium: 131 mmol/L — ABNORMAL LOW (ref 135–145)

## 2024-04-27 LAB — BLOOD GAS, VENOUS
Acid-base deficit: 9.2 mmol/L — ABNORMAL HIGH (ref 0.0–2.0)
Bicarbonate: 16.2 mmol/L — ABNORMAL LOW (ref 20.0–28.0)
Drawn by: 44828
O2 Saturation: 74 %
Patient temperature: 37.9
pCO2, Ven: 34 mmHg — ABNORMAL LOW (ref 44–60)
pH, Ven: 7.29 (ref 7.25–7.43)
pO2, Ven: 46 mmHg — ABNORMAL HIGH (ref 32–45)

## 2024-04-27 LAB — CBC
HCT: 32.9 % — ABNORMAL LOW (ref 36.0–46.0)
Hemoglobin: 10.4 g/dL — ABNORMAL LOW (ref 12.0–15.0)
MCH: 27.4 pg (ref 26.0–34.0)
MCHC: 31.6 g/dL (ref 30.0–36.0)
MCV: 86.6 fL (ref 80.0–100.0)
Platelets: 147 10*3/uL — ABNORMAL LOW (ref 150–400)
RBC: 3.8 MIL/uL — ABNORMAL LOW (ref 3.87–5.11)
RDW: 17.5 % — ABNORMAL HIGH (ref 11.5–15.5)
WBC: 14.6 10*3/uL — ABNORMAL HIGH (ref 4.0–10.5)
nRBC: 0.3 % — ABNORMAL HIGH (ref 0.0–0.2)

## 2024-04-27 LAB — BASIC METABOLIC PANEL WITH GFR
Anion gap: 18 — ABNORMAL HIGH (ref 5–15)
Anion gap: 17 — ABNORMAL HIGH (ref 5–15)
BUN: 58 mg/dL — ABNORMAL HIGH (ref 8–23)
BUN: 63 mg/dL — ABNORMAL HIGH (ref 8–23)
CO2: 14 mmol/L — ABNORMAL LOW (ref 22–32)
CO2: 13 mmol/L — ABNORMAL LOW (ref 22–32)
Calcium: 8.4 mg/dL — ABNORMAL LOW (ref 8.9–10.3)
Calcium: 8 mg/dL — ABNORMAL LOW (ref 8.9–10.3)
Chloride: 100 mmol/L (ref 98–111)
Chloride: 101 mmol/L (ref 98–111)
Creatinine, Ser: 6.07 mg/dL — ABNORMAL HIGH (ref 0.44–1.00)
Creatinine, Ser: 6.22 mg/dL — ABNORMAL HIGH (ref 0.44–1.00)
GFR, Estimated: 7 mL/min — ABNORMAL LOW
GFR, Estimated: 6 mL/min — ABNORMAL LOW
Glucose, Bld: 232 mg/dL — ABNORMAL HIGH (ref 70–99)
Glucose, Bld: 157 mg/dL — ABNORMAL HIGH (ref 70–99)
Potassium: 5.8 mmol/L — ABNORMAL HIGH (ref 3.5–5.1)
Potassium: 4.9 mmol/L (ref 3.5–5.1)
Sodium: 132 mmol/L — ABNORMAL LOW (ref 135–145)
Sodium: 131 mmol/L — ABNORMAL LOW (ref 135–145)

## 2024-04-27 LAB — TROPONIN T, HIGH SENSITIVITY
Troponin T High Sensitivity: 39 ng/L — ABNORMAL HIGH (ref 0–19)
Troponin T High Sensitivity: 38 ng/L — ABNORMAL HIGH (ref 0–19)

## 2024-04-27 LAB — POTASSIUM: Potassium: 4.8 mmol/L (ref 3.5–5.1)

## 2024-04-27 LAB — GLUCOSE, CAPILLARY
Glucose-Capillary: 219 mg/dL — ABNORMAL HIGH (ref 70–99)
Glucose-Capillary: 292 mg/dL — ABNORMAL HIGH (ref 70–99)

## 2024-04-27 LAB — MRSA NEXT GEN BY PCR, NASAL: MRSA by PCR Next Gen: NOT DETECTED

## 2024-04-27 MED ORDER — CHLORHEXIDINE GLUCONATE CLOTH 2 % EX PADS
6.0000 | MEDICATED_PAD | Freq: Every day | CUTANEOUS | Status: DC
  Administered 2024-04-28 – 2024-04-30 (×3): 6 via TOPICAL

## 2024-04-27 MED ORDER — INSULIN ASPART 100 UNIT/ML IV SOLN
5.0000 [IU] | Freq: Once | INTRAVENOUS | Status: AC
  Administered 2024-04-27: 5 [IU] via INTRAVENOUS
  Filled 2024-04-27: qty 1

## 2024-04-27 MED ORDER — NOREPINEPHRINE 4 MG/250ML-% IV SOLN
INTRAVENOUS | Status: AC
  Filled 2024-04-27: qty 250

## 2024-04-27 MED ORDER — SODIUM CHLORIDE 0.9 % IV SOLN: INTRAVENOUS | Status: DC

## 2024-04-27 MED ORDER — SODIUM CHLORIDE 0.9 % IV SOLN: 250.0000 mL | INTRAVENOUS | Status: AC

## 2024-04-27 MED ORDER — ALBUTEROL SULFATE (2.5 MG/3ML) 0.083% IN NEBU
10.0000 mg | INHALATION_SOLUTION | Freq: Once | RESPIRATORY_TRACT | Status: AC
  Administered 2024-04-27: 10 mg via RESPIRATORY_TRACT
  Filled 2024-04-27: qty 12

## 2024-04-27 MED ORDER — NOREPINEPHRINE 4 MG/250ML-% IV SOLN
0.0000 ug/min | INTRAVENOUS | Status: DC
  Administered 2024-04-27: 3 ug/min via INTRAVENOUS
  Filled 2024-04-27: qty 250

## 2024-04-27 MED ORDER — NOREPINEPHRINE 4 MG/250ML-% IV SOLN
0.0000 ug/min | INTRAVENOUS | Status: DC
  Administered 2024-04-27: 11 ug/min via INTRAVENOUS
  Administered 2024-04-28: 3 ug/min via INTRAVENOUS
  Filled 2024-04-27: qty 250

## 2024-04-27 MED ORDER — INSULIN ASPART 100 UNIT/ML IJ SOLN
0.0000 [IU] | Freq: Three times a day (TID) | INTRAMUSCULAR | Status: DC
  Administered 2024-04-28 (×2): 3 [IU] via SUBCUTANEOUS
  Administered 2024-04-28 – 2024-04-29 (×2): 2 [IU] via SUBCUTANEOUS
  Administered 2024-04-29: 3 [IU] via SUBCUTANEOUS
  Administered 2024-04-29: 2 [IU] via SUBCUTANEOUS
  Administered 2024-04-30: 1 [IU] via SUBCUTANEOUS
  Administered 2024-04-30 (×2): 2 [IU] via SUBCUTANEOUS
  Filled 2024-04-27 (×9): qty 1

## 2024-04-27 MED ORDER — ALBUMIN HUMAN 25 % IV SOLN
12.5000 g | Freq: Once | INTRAVENOUS | Status: AC
  Administered 2024-04-27: 12.5 g via INTRAVENOUS
  Filled 2024-04-27: qty 50

## 2024-04-27 MED ORDER — SODIUM CHLORIDE 0.9 % IV BOLUS
1000.0000 mL | Freq: Once | INTRAVENOUS | Status: AC
  Administered 2024-04-27: 1000 mL via INTRAVENOUS

## 2024-04-27 MED ORDER — DEXTROSE 50 % IV SOLN
1.0000 | Freq: Once | INTRAVENOUS | Status: AC
  Administered 2024-04-27: 50 mL via INTRAVENOUS
  Filled 2024-04-27: qty 50

## 2024-04-27 MED ORDER — INSULIN ASPART 100 UNIT/ML IJ SOLN
0.0000 [IU] | Freq: Every day | INTRAMUSCULAR | Status: DC
  Administered 2024-04-27: 2 [IU] via SUBCUTANEOUS
  Administered 2024-04-28 – 2024-04-29 (×2): 3 [IU] via SUBCUTANEOUS
  Administered 2024-04-30: 2 [IU] via SUBCUTANEOUS
  Filled 2024-04-27 (×4): qty 1

## 2024-04-27 MED ORDER — SODIUM BICARBONATE 8.4 % IV SOLN
50.0000 meq | Freq: Once | INTRAVENOUS | Status: AC
  Administered 2024-04-27: 50 meq via INTRAVENOUS

## 2024-04-27 MED ORDER — SODIUM CHLORIDE 0.9% FLUSH
10.0000 mL | Freq: Two times a day (BID) | INTRAVENOUS | Status: DC
  Administered 2024-04-27 – 2024-04-30 (×6): 10 mL

## 2024-04-27 MED ORDER — IPRATROPIUM-ALBUTEROL 0.5-2.5 (3) MG/3ML IN SOLN
3.0000 mL | Freq: Once | RESPIRATORY_TRACT | Status: AC
  Administered 2024-04-27: 3 mL via RESPIRATORY_TRACT
  Filled 2024-04-27: qty 3

## 2024-04-27 MED ORDER — SODIUM ZIRCONIUM CYCLOSILICATE 10 G PO PACK
10.0000 g | PACK | Freq: Three times a day (TID) | ORAL | Status: AC
  Administered 2024-04-27 – 2024-04-28 (×4): 10 g via ORAL
  Filled 2024-04-27 (×4): qty 1

## 2024-04-27 MED ORDER — SODIUM POLYSTYRENE SULFONATE 15 GM/60ML CO SUSP
30.0000 g | Freq: Once | Status: DC
  Filled 2024-04-27: qty 120

## 2024-04-27 MED ORDER — ACETAMINOPHEN 650 MG RE SUPP: 650.0000 mg | Freq: Four times a day (QID) | RECTAL | Status: DC | PRN

## 2024-04-27 MED ORDER — CALCIUM GLUCONATE-NACL 1-0.675 GM/50ML-% IV SOLN
1.0000 g | Freq: Once | INTRAVENOUS | Status: AC
  Administered 2024-04-27: 1000 mg via INTRAVENOUS
  Filled 2024-04-27: qty 50

## 2024-04-27 MED ORDER — IPRATROPIUM-ALBUTEROL 0.5-2.5 (3) MG/3ML IN SOLN: 3.0000 mL | Freq: Four times a day (QID) | RESPIRATORY_TRACT | Status: DC | PRN

## 2024-04-27 MED ORDER — SODIUM BICARBONATE 8.4 % IV SOLN
INTRAVENOUS | Status: AC
  Administered 2024-04-27: 50 meq
  Filled 2024-04-27: qty 50

## 2024-04-27 MED ORDER — HYDROCORTISONE SOD SUC (PF) 100 MG IJ SOLR
50.0000 mg | Freq: Three times a day (TID) | INTRAMUSCULAR | Status: DC
  Administered 2024-04-27 – 2024-04-28 (×2): 50 mg via INTRAVENOUS
  Filled 2024-04-27 (×2): qty 2

## 2024-04-27 MED ORDER — SODIUM ZIRCONIUM CYCLOSILICATE 5 G PO PACK
5.0000 g | PACK | Freq: Two times a day (BID) | ORAL | Status: DC
  Administered 2024-04-27: 5 g via ORAL
  Filled 2024-04-27: qty 1

## 2024-04-27 MED ORDER — ACETAMINOPHEN 325 MG PO TABS
650.0000 mg | ORAL_TABLET | Freq: Four times a day (QID) | ORAL | Status: DC | PRN
  Administered 2024-04-27: 650 mg via ORAL
  Filled 2024-04-27: qty 2

## 2024-04-27 MED ORDER — ZOLPIDEM TARTRATE 5 MG PO TABS
5.0000 mg | ORAL_TABLET | Freq: Every evening | ORAL | Status: DC | PRN
  Administered 2024-04-27: 5 mg via ORAL
  Filled 2024-04-27: qty 1

## 2024-04-27 MED ORDER — HYDROCORTISONE SOD SUC (PF) 100 MG IJ SOLR
100.0000 mg | Freq: Once | INTRAMUSCULAR | Status: AC
  Administered 2024-04-27: 100 mg via INTRAVENOUS
  Filled 2024-04-27: qty 2

## 2024-04-27 MED ORDER — SODIUM CHLORIDE 0.9 % IV SOLN
2.0000 g | INTRAVENOUS | Status: DC
  Administered 2024-04-27 – 2024-04-29 (×3): 2 g via INTRAVENOUS
  Filled 2024-04-27 (×3): qty 20

## 2024-04-27 MED ORDER — SODIUM BICARBONATE 8.4 % IV SOLN
50.0000 meq | Freq: Once | INTRAVENOUS | Status: AC
  Administered 2024-04-27: 50 meq via INTRAVENOUS
  Filled 2024-04-27: qty 50

## 2024-04-27 MED ORDER — SODIUM CHLORIDE 0.9% FLUSH: 10.0000 mL | INTRAVENOUS | Status: DC | PRN
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
## 2024-04-27 NOTE — Op Note (Signed)
 Patient:  BRITISH MOYD  DOB:  [birth year]  MRN:  [id number]   Preop Diagnosis: Sepsis, need for central venous access for pressor support  Postop Diagnosis: Same  Procedure: Central line placement  Surgeon: Oneil Budge, MD  Anes: Local  Indications: Patient is an 79 year old black female who has had increasing Levophed  requirements for support of her blood pressure.  I have been requested to place a central line.  The risks and benefits of the procedure including bleeding and infection were fully explained to the patient, who gave informed consent.  Procedure note: The procedure was performed at bedside in ICU bed 5.  A femoral approach had to be done as the patient was coagulopathic with an INR that was elevated and the patient has been on Eliquis  for history of DVT.  The right groin was prepped and draped using the usual sterile technique with ChloraPrep.  Surgical site confirmation was performed.  1% Xylocaine  was used for local anesthesia.  The right femoral vein was accessed without difficulty.  A guidewire was then advanced into the right femoral vein.  An introducer was placed over the guidewire.  A 20 cm triple-lumen catheter was then placed over the guidewire and the guidewire was removed.  Good backflow of venous blood was noted on aspiration of all 3 ports.  All 3 ports were flushed with saline.  The catheter was secured in place at the skin level using 3-0 silk sutures.  A dry sterile dressing was applied.  The patient tolerated the procedure well.  Complications: None  EBL: Minimal  Specimen: None

## 2024-04-27 NOTE — ED Provider Notes (Signed)
 Called to room for increased WOB, wheezing and hypoxia. She has a history of COPD but has also had 4+ liters of fluid for sepsis treatment. Started on supplemental oxygen with improved SpO2, will give a neb and check CXR. BP is stable.   Clinical Course as of 04/27/24 0242  Mon Apr 27, 2024  0220 Hospitalist team is aware of status change.  [CS]  [id number] I personally viewed the images from radiology studies and agree with radiologist interpretation: CXR is unchanged.  [CS]  0240 Some improvement after neb, but still with some increased WOB. She is not making much urine and bladder scan is neg.   [CS]    Clinical Course User Index [CS] Roselyn Carlin NOVAK, MD      Roselyn Carlin NOVAK, MD 04/27/24 [phone]

## 2024-04-27 NOTE — Progress Notes (Addendum)
"  ° °      Overnight   NAME: [name] MRN: [id number] DOB : 06-Nov-1945    Date of Service   04/27/2024   HPI/Events of Note    Notified by assigned Paramedic for HR/RR. Verification of Temperature yields earlier temperature of 104.4 with reflective vitals. Currently 100.3 F with vitals increasing as earlier. Patient is receiving Nebulizer treatment currently.   Interventions/ Plan    Tylenol  as needed. Environmental Cooling measures as needed. Nebulizer treatments as needed. Nephrology      Lynwood Kipper BSN MSNA MSN ACNPC-AG Acute Care Nurse Practitioner [facility]  "

## 2024-04-27 NOTE — ED Notes (Signed)
 Patient transported to X-ray

## 2024-04-27 NOTE — Progress Notes (Signed)
 PHARMACY - PHYSICIAN COMMUNICATION CRITICAL VALUE ALERT - BLOOD CULTURE IDENTIFICATION (BCID)  [name] is an 79 y.o. female who presented to [facility] on 04/26/2024 with a chief complaint of flank pain  Assessment:  79 y.o. female with medical history significant for homocystinemia, COPD, DVT on Eliquis , htn, goiter, CKD with baseline cr=2.8, and DMT2. She presented to the emergency department because of 2 days of feeling very sick.  She has had fever and chills . BCX are positive E. Coli  Name of physician (or Provider) Contacted: DrRONITA Pearlean  Current antibiotics: Ceftriaxone  1gm IV q24h Received cefepime  2gm IV x 1 1/25 at 1800(crcl 8mls/min) Changes to prescribed antibiotics recommended:  Increase ceftriaxone  2gm IV q24h  Results for orders placed or performed during the hospital encounter of 04/26/24  Blood Culture ID Panel (Reflexed) (Collected: 04/26/2024  5:42 PM)  Result Value Ref Range   Enterococcus faecalis NOT DETECTED NOT DETECTED   Enterococcus Faecium NOT DETECTED NOT DETECTED   Listeria monocytogenes NOT DETECTED NOT DETECTED   Staphylococcus species NOT DETECTED NOT DETECTED   Staphylococcus aureus (BCID) NOT DETECTED NOT DETECTED   Staphylococcus epidermidis NOT DETECTED NOT DETECTED   Staphylococcus lugdunensis NOT DETECTED NOT DETECTED   Streptococcus species NOT DETECTED NOT DETECTED   Streptococcus agalactiae NOT DETECTED NOT DETECTED   Streptococcus pneumoniae NOT DETECTED NOT DETECTED   Streptococcus pyogenes NOT DETECTED NOT DETECTED   A.calcoaceticus-baumannii NOT DETECTED NOT DETECTED   Bacteroides fragilis NOT DETECTED NOT DETECTED   Enterobacterales DETECTED (A) NOT DETECTED   Enterobacter cloacae complex NOT DETECTED NOT DETECTED   Escherichia coli DETECTED (A) NOT DETECTED   Klebsiella aerogenes NOT DETECTED NOT DETECTED   Klebsiella oxytoca NOT DETECTED NOT DETECTED   Klebsiella pneumoniae NOT DETECTED NOT DETECTED   Proteus species NOT  DETECTED NOT DETECTED   Salmonella species NOT DETECTED NOT DETECTED   Serratia marcescens NOT DETECTED NOT DETECTED   Haemophilus influenzae NOT DETECTED NOT DETECTED   Neisseria meningitidis NOT DETECTED NOT DETECTED   Pseudomonas aeruginosa NOT DETECTED NOT DETECTED   Stenotrophomonas maltophilia NOT DETECTED NOT DETECTED   Candida albicans NOT DETECTED NOT DETECTED   Candida auris NOT DETECTED NOT DETECTED   Candida glabrata NOT DETECTED NOT DETECTED   Candida krusei NOT DETECTED NOT DETECTED   Candida parapsilosis NOT DETECTED NOT DETECTED   Candida tropicalis NOT DETECTED NOT DETECTED   Cryptococcus neoformans/gattii NOT DETECTED NOT DETECTED   CTX-M ESBL NOT DETECTED NOT DETECTED   Carbapenem resistance IMP NOT DETECTED NOT DETECTED   Carbapenem resistance KPC NOT DETECTED NOT DETECTED   Carbapenem resistance NDM NOT DETECTED NOT DETECTED   Carbapenem resist OXA 48 LIKE NOT DETECTED NOT DETECTED   Carbapenem resistance VIM NOT DETECTED NOT DETECTED    [name] 04/27/2024  2:49 PM

## 2024-04-27 NOTE — ED Notes (Signed)
 Pt wheeled to the bathroom to have a bm.

## 2024-04-27 NOTE — Inpatient Diabetes Management (Signed)
 Inpatient Diabetes Program Recommendations  AACE/ADA: New Consensus Statement on Inpatient Glycemic Control   Target Ranges:  Prepandial:   less than 140 mg/dL      Peak postprandial:   less than 180 mg/dL (1-2 hours)      Critically ill patients:  140 - 180 mg/dL    Latest Reference Range & Units 04/26/24 17:42 04/27/24 04:25  Glucose 70 - 99 mg/dL 729 (H) 767 (H)   Review of Glycemic Control  Diabetes history: DM2 Outpatient Diabetes medications: Jardiance  10 mg QAM, Mounjaro 2.5 mg Qweek Current orders for Inpatient glycemic control: None  Inpatient Diabetes Program Recommendations:    Insulin : Lab glucose 232 mg/dl this morning.  Please consider ordering CBGs AC&HS and Novolog  0-9 units TID with meals and Novolog  0-5 units at bedtime.  Thanks, Earnie Gainer, RN, MSN, CDCES Diabetes Coordinator Inpatient Diabetes Program [phone] (Team Pager from 8am to 5pm)

## 2024-04-27 NOTE — Plan of Care (Signed)
" °  Problem: Acute Rehab PT Goals(only PT should resolve) Goal: Pt Will Go Supine/Side To Sit Outcome: Progressing Flowsheets (Taken 04/27/2024 1533) Pt will go Supine/Side to Sit: with moderate assist Goal: Patient Will Transfer Sit To/From Stand Outcome: Progressing Flowsheets (Taken 04/27/2024 1533) Patient will transfer sit to/from stand: with moderate assist Goal: Pt Will Transfer Bed To Chair/Chair To Bed Outcome: Progressing Flowsheets (Taken 04/27/2024 1533) Pt will Transfer Bed to Chair/Chair to Bed: with mod assist Goal: Pt Will Ambulate Outcome: Progressing Flowsheets (Taken 04/27/2024 1533) Pt will Ambulate:  25 feet  with rolling walker  with moderate assist   "

## 2024-04-27 NOTE — Progress Notes (Addendum)
 " PROGRESS NOTE  Cortlynn Hollinsworth, is a 79 y.o. female, DOB - 04-Jan-1946, FMW:[id number]  Admit date - 04/26/2024   Admitting Physician Shelena Castelluccio Pearlean, MD  Outpatient Primary MD for the patient is [name], MD  LOS - 1  Chief Complaint  Patient presents with   Flank Pain      Brief Narrative:  a 79 y.o. female with medical history significant for homocystinemia, COPD, H/o DVT on Eliquis , htn, goiter, CKD IV, chronic anemia of CKD (sees Dr. Woodward Brought), presumed renal carcinoma and HTN admitted on 04/26/2021 with E. coli bacteremia and severe sepsis with septic shock    -Assessment and Plan: 1)Severe E. coli sepsis with septic shock--- blood cultures from 04/26/2024 with E. Coli--- suspect this is from urinary source - Patient had mostly pansensitive E. coli back in November 2025 in the urine - Received cefepime  in the ED - Continue IV Rocephin  pending culture data -Add hydrocortisone  per The 2024 Society of Critical Care Medicine focused update recommendations for patients with septic shock requiring vasopressors  WBC 19.7 >>14.6 --leukocytosis may persist due to initiation of high-dose steroids --Right upper quadrant ultrasound without acute cholecystitis -CT abdomen and pelvis without obstructive uropathy ----Continue IV fluids, IV Levophed   2)AKI----acute kidney injury on CKD stage - IV with metabolic acidosis and hyperkalemia---due to UTI, dehydration, sepsis with septic shock with hypoperfusion - creatinine on admission=5.66  ,  baseline creatinine = 2.7 to 2.8 (02/2024)    ,  -creatinine is now= 6.48   -IV fluids  as above -- renally adjust medications, avoid nephrotoxic agents / dehydration  / hypotension -- Patient follows with nephrologist Dr. Woodward Brought at [facility]  3)presumed renal carcinoma--- imaging studies suggest possible right and left renal carcinoma -- Patient and family does not desire further workup/investigation at this  time  4)Hyperkalemia--- due to #2 above -EKG without concerning changes at this time - Give bicarb, Lokelma , D50/insulin , and albuterol .  Hyperkalemia cocktail protocol  5) chronic anemia--- multifactorial in the setting of CKD 4 and underlying malignancy -Hgb currently close to -- Anticipate drop in H&H with IV fluids/hemodilution due to sepsis as above #1 - Monitor closely and transfuse as indicated  6)DM2-A1c previously around 9 - No recent A1c - Anticipate hyperglycemia with steroids as above #1 Use Novolog /Humalog Sliding scale insulin  with Accu-Cheks/Fingersticks as ordered   7) HTN--hold PTA BP meds due to septic shock with persistent hypotension  8) social/ethics--plan of care discussed with patient, patient's husband and patient's son at bedside -- Family will have further discussions amongst themselves regarding CODE STATUS for now patient remains a full code  9) mild hyponatremia---due to recurrent emesis and dehydration -Continue IV fluids   CRITICAL CARE Performed by: Rendall Pearlean   Total critical care time: 58 minutes  Critical care time was exclusive of separately billable procedures and treating other patients.  Critical care was necessary to treat or prevent imminent or life-threatening deterioration. Persistent hypotension despite IV fluids and IV Levophed  due to severe E. coli sepsis with septic shock Critical care was time spent personally by me on the following activities: development of treatment plan with patient and/or surrogate as well as nursing, discussions with consultants, evaluation of patient's response to treatment, examination of patient, obtaining history from patient or surrogate, ordering and performing treatments and interventions, ordering and review of laboratory studies, ordering and review of radiographic studies, pulse oximetry and re-evaluation of patient's condition. -- Status is: Inpatient   Disposition: The patient is from:  Home              Anticipated d/c is to: Home              Anticipated d/c date is: > 3 days              Patient currently is not medically stable to d/c. Barriers: Not Clinically Stable-   Code Status :  -  Code Status: Full Code   Family Communication:    (patient is alert, awake and coherent)  - discussed with patient, patient's husband and patient's son at bedside  DVT Prophylaxis  :   - SCDs   apixaban  (ELIQUIS ) tablet 5 mg   Lab Results  Component Value Date   PLT 147 (L) 04/27/2024   Inpatient Medications  Scheduled Meds:  albuterol   10 mg Nebulization Once   apixaban   5 mg Oral BID   calcitRIOL   0.25 mcg Oral Daily   [START ON 04/28/2024] Chlorhexidine  Gluconate Cloth  6 each Topical Q0600   insulin  aspart  5 Units Intravenous Once   And   dextrose   1 ampule Intravenous Once   hydrocortisone  sod succinate (SOLU-CORTEF ) inj  100 mg Intravenous Once   hydrocortisone  sod succinate (SOLU-CORTEF ) inj  50 mg Intravenous Q8H   pantoprazole   40 mg Oral Daily   sodium bicarbonate   50 mEq Intravenous Once   sodium chloride  flush  3 mL Intravenous Q12H   sodium polystyrene  30 g Rectal Once   sodium zirconium cyclosilicate   5 g Oral BID   Continuous Infusions:  sodium chloride      sodium chloride      calcium  gluconate     cefTRIAXone  (ROCEPHIN )  IV 2 g (04/27/24 1739)   norepinephrine  (LEVOPHED ) Adult infusion 3 mcg/min (04/27/24 1644)   sodium chloride      PRN Meds:.acetaminophen  **OR** acetaminophen , bisacodyl , ipratropium-albuterol , zolpidem    Anti-infectives (From admission, onward)    Start     Dose/Rate Route Frequency Ordered Stop   04/27/24 1800  cefTRIAXone  (ROCEPHIN ) 2 g in sodium chloride  0.9 % 100 mL IVPB        2 g 200 mL/hr over 30 Minutes Intravenous Every 24 hours 04/27/24 1448     04/27/24 0600  cefTRIAXone  (ROCEPHIN ) 1 g in sodium chloride  0.9 % 100 mL IVPB  Status:  Discontinued        1 g 200 mL/hr over 30 Minutes Intravenous Every 24 hours  04/26/24 2017 04/27/24 1448   04/26/24 1745  ceFEPIme  (MAXIPIME ) 2 g in sodium chloride  0.9 % 100 mL IVPB        2 g 200 mL/hr over 30 Minutes Intravenous  Once 04/26/24 1735 04/26/24 1806   04/26/24 1745  metroNIDAZOLE  (FLAGYL ) IVPB 500 mg        500 mg 100 mL/hr over 60 Minutes Intravenous  Once 04/26/24 1735 04/26/24 1907        Subjective: Yekaterina Escutia today has no fevers, no emesis,  No chest pain,   discussed with patient, patient's husband and patient's son at bedside - Recommend more hypotensive, more lethargic - No significant urine output-  Objective: Vitals:   04/27/24 1519 04/27/24 1600 04/27/24 1700 04/27/24 1800  BP:   (!) 87/39 (!) 86/40  Pulse: (!) 110 (!) 111 (!) 111 (!) 112  Resp: (!) 29 (!) 28 (!) 32 (!) 30  Temp:      TempSrc:      SpO2: 93% 100% 100% 100%  Weight:  Height:        Intake/Output Summary (Last 24 hours) at 04/27/2024 1816 Last data filed at 04/26/2024 2034 Gross per 24 hour  Intake 3100 ml  Output --  Net 3100 ml   Filed Weights   04/26/24 1721 04/27/24 1353  Weight: 73.5 kg 79.2 kg    Physical Exam  Gen:-Becoming sleepy on and off HEENT:- [place].AT, No sclera icterus Neck-Supple Neck,No JVD,.  Lungs-  CTAB , fair symmetrical air movement CV- S1, S2 normal, regular, tachycardic Abd-  +ve B.Sounds, Abd Soft, No tenderness, no CVA tenderness Extremity/Skin:- No  edema, pedal pulses present  Psych-affect is sleepy on and off, oriented x3 Neuro-generalized weakness, no new focal deficits, no tremors GU--Foley to be placed  Data Reviewed: I have personally reviewed following labs and imaging studies  CBC: Recent Labs  Lab 04/26/24 1742 04/27/24 0425  WBC 19.7* 14.6*  NEUTROABS 16.0*  --   HGB 10.3* 10.4*  HCT 32.8* 32.9*  MCV 87.7 86.6  PLT 177 147*   Basic Metabolic Panel: Recent Labs  Lab 04/26/24 1742 04/27/24 0425 04/27/24 1700  NA 131* 132* 131*  K 5.0 5.8* 5.6*  CL 99 100 99  CO2 15* 14* 13*  GLUCOSE  270* 232* 145*  BUN 55* 58* 64*  CREATININE 5.66* 6.07* 6.48*  CALCIUM  8.7* 8.4* 8.5*  PHOS  --   --  3.2   GFR: Estimated Creatinine Clearance: 7.2 mL/min (A) (by C-G formula based on SCr of 6.48 mg/dL (H)). Liver Function Tests: Recent Labs  Lab 04/26/24 1742 04/27/24 1700  AST 32  --   ALT 13  --   ALKPHOS 110  --   BILITOT 0.4  --   PROT 6.6  --   ALBUMIN  3.3* 2.8*   Cardiac Enzymes: No results for input(s): CKTOTAL, CKMB, CKMBINDEX, TROPONINI in the last 168 hours. BNP (last 3 results) No results for input(s): PROBNP in the last 8760 hours. HbA1C: No results for input(s): HGBA1C in the last 72 hours. Sepsis Labs: @LABRCNTIP (procalcitonin:4,lacticidven:4) ) Recent Results (from the past 240 hours)  Culture, blood (Routine x 2)     Status: None (Preliminary result)   Collection Time: 04/26/24  5:40 PM   Specimen: BLOOD RIGHT FOREARM  Result Value Ref Range Status   Specimen Description   Final    BLOOD RIGHT FOREARM BOTTLES DRAWN AEROBIC AND ANAEROBIC Performed at [facility], [address] [place], [place] [address]    Special Requests   Final    Blood Culture results may not be optimal due to an inadequate volume of blood received in culture bottles Performed at [facility], [address], [place], [place] [address]    Culture  Setup Time   Final    IN BOTH AEROBIC AND ANAEROBIC BOTTLES GRAM NEGATIVE RODS Gram Stain Report Called to,Read Back By and Verified With: C. RUSH RN 04/27/24 @0614  BY J. WHITE CRITICAL VALUE NOTED.  VALUE IS CONSISTENT WITH PREVIOUSLY REPORTED AND CALLED VALUE. Performed at [facility], [address], [place], [place] [address]    Culture GRAM NEGATIVE RODS  Final   Report Status PENDING  Incomplete  Culture, blood (Routine x 2)     Status: None (Preliminary result)   Collection Time: 04/26/24  5:42 PM   Specimen: Left Antecubital; Blood  Result Value Ref Range Status   Specimen Description   Final    LEFT  ANTECUBITAL Performed at [facility], [address], [place], [place]  [address]    Special Requests   Final    BOTTLES DRAWN AEROBIC AND ANAEROBIC Blood Culture adequate volume Performed at [facility], [address] [place], [place] [address]    Culture  Setup Time   Final    GRAM NEGATIVE RODS AEROBIC BOTTLE ONLY Gram Stain Report Called to,Read Back By and Verified With: C RUSH AT 0655 ON [id number] BY S [place] CRITICAL RESULT CALLED TO, READ BACK BY AND VERIFIED WITH: PHARMD Cherlyn Boers on [phone] @1445  by SM Performed at [facility], [address] [place], [place] [address]    Culture GRAM NEGATIVE RODS  Final   Report Status PENDING  Incomplete  Blood Culture ID Panel (Reflexed)     Status: Abnormal   Collection Time: 04/26/24  5:42 PM  Result Value Ref Range Status   Enterococcus faecalis NOT DETECTED NOT DETECTED Final   Enterococcus Faecium NOT DETECTED NOT DETECTED Final   Listeria monocytogenes NOT DETECTED NOT DETECTED Final   Staphylococcus species NOT DETECTED NOT DETECTED Final   Staphylococcus aureus (BCID) NOT DETECTED NOT DETECTED Final   Staphylococcus epidermidis NOT DETECTED NOT DETECTED Final   Staphylococcus lugdunensis NOT DETECTED NOT DETECTED Final   Streptococcus species NOT DETECTED NOT DETECTED Final   Streptococcus agalactiae NOT DETECTED NOT DETECTED Final   Streptococcus pneumoniae NOT DETECTED NOT DETECTED Final   Streptococcus pyogenes NOT DETECTED NOT DETECTED Final   A.calcoaceticus-baumannii NOT DETECTED NOT DETECTED Final   Bacteroides fragilis NOT DETECTED NOT DETECTED Final   Enterobacterales DETECTED (A) NOT DETECTED Final    Comment: Enterobacterales represent a large order of gram negative bacteria, not a single organism. CRITICAL RESULT CALLED TO, READ BACK BY AND VERIFIED WITH: PHARMD Cherlyn Boers on [phone] @1445  by SM    Enterobacter cloacae complex NOT DETECTED NOT DETECTED Final   Escherichia coli DETECTED (A)  NOT DETECTED Final    Comment: CRITICAL RESULT CALLED TO, READ BACK BY AND VERIFIED WITH: PHARMD Cherlyn Boers on [phone] @1445  by SM    Klebsiella aerogenes NOT DETECTED NOT DETECTED Final   Klebsiella oxytoca NOT DETECTED NOT DETECTED Final   Klebsiella pneumoniae NOT DETECTED NOT DETECTED Final   Proteus species NOT DETECTED NOT DETECTED Final   Salmonella species NOT DETECTED NOT DETECTED Final   Serratia marcescens NOT DETECTED NOT DETECTED Final   Haemophilus influenzae NOT DETECTED NOT DETECTED Final   Neisseria meningitidis NOT DETECTED NOT DETECTED Final   Pseudomonas aeruginosa NOT DETECTED NOT DETECTED Final   Stenotrophomonas maltophilia NOT DETECTED NOT DETECTED Final   Candida albicans NOT DETECTED NOT DETECTED Final   Candida auris NOT DETECTED NOT DETECTED Final   Candida glabrata NOT DETECTED NOT DETECTED Final   Candida krusei NOT DETECTED NOT DETECTED Final   Candida parapsilosis NOT DETECTED NOT DETECTED Final   Candida tropicalis NOT DETECTED NOT DETECTED Final   Cryptococcus neoformans/gattii NOT DETECTED NOT DETECTED Final   CTX-M ESBL NOT DETECTED NOT DETECTED Final   Carbapenem resistance IMP NOT DETECTED NOT DETECTED Final   Carbapenem resistance KPC NOT DETECTED NOT DETECTED Final   Carbapenem resistance NDM NOT DETECTED NOT DETECTED Final   Carbapenem resist OXA 48 LIKE NOT DETECTED NOT DETECTED Final   Carbapenem resistance VIM NOT DETECTED NOT DETECTED Final    Comment: Performed at [facility], [address], [place], [place] [address]  Resp panel by RT-PCR (RSV, Flu A&B, Covid) Anterior Nasal Swab     Status: None  Collection Time: 04/26/24  5:47 PM   Specimen: Anterior Nasal Swab  Result Value Ref Range Status   SARS Coronavirus 2 by RT PCR NEGATIVE NEGATIVE Final    Comment: (NOTE) SARS-CoV-2 target nucleic acids are NOT DETECTED.  The SARS-CoV-2 RNA is generally detectable in upper respiratory specimens during the acute phase of infection.  The lowest concentration of SARS-CoV-2 viral copies this assay can detect is 138 copies/mL. A negative result does not preclude SARS-Cov-2 infection and should not be used as the sole basis for treatment or other patient management decisions. A negative result may occur with  improper specimen collection/handling, submission of specimen other than nasopharyngeal swab, presence of viral mutation(s) within the areas targeted by this assay, and inadequate number of viral copies(<138 copies/mL). A negative result must be combined with clinical observations, patient history, and epidemiological information. The expected result is Negative.  Fact Sheet for Patients:  bloggercourse.com  Fact Sheet for Healthcare Providers:  seriousbroker.it  This test is no t yet approved or cleared by the [place]  FDA and  has been authorized for detection and/or diagnosis of SARS-CoV-2 by FDA under an Emergency Use Authorization (EUA). This EUA will remain  in effect (meaning this test can be used) for the duration of the COVID-19 declaration under Section 564(b)(1) of the Act, 21 U.S.C.section 360bbb-3(b)(1), unless the authorization is terminated  or revoked sooner.       Influenza A by PCR NEGATIVE NEGATIVE Final   Influenza B by PCR NEGATIVE NEGATIVE Final    Comment: (NOTE) The Xpert Xpress SARS-CoV-2/FLU/RSV plus assay is intended as an aid in the diagnosis of influenza from Nasopharyngeal swab specimens and should not be used as a sole basis for treatment. Nasal washings and aspirates are unacceptable for Xpert Xpress SARS-CoV-2/FLU/RSV testing.  Fact Sheet for Patients: bloggercourse.com  Fact Sheet for Healthcare Providers: seriousbroker.it  This test is not yet approved or cleared by the [place]  FDA and has been authorized for detection and/or diagnosis of SARS-CoV-2 by FDA  under an Emergency Use Authorization (EUA). This EUA will remain in effect (meaning this test can be used) for the duration of the COVID-19 declaration under Section 564(b)(1) of the Act, 21 U.S.C. section 360bbb-3(b)(1), unless the authorization is terminated or revoked.     Resp Syncytial Virus by PCR NEGATIVE NEGATIVE Final    Comment: (NOTE) Fact Sheet for Patients: bloggercourse.com  Fact Sheet for Healthcare Providers: seriousbroker.it  This test is not yet approved or cleared by the [place]  FDA and has been authorized for detection and/or diagnosis of SARS-CoV-2 by FDA under an Emergency Use Authorization (EUA). This EUA will remain in effect (meaning this test can be used) for the duration of the COVID-19 declaration under Section 564(b)(1) of the Act, 21 U.S.C. section 360bbb-3(b)(1), unless the authorization is terminated or revoked.  Performed at [facility], [address], [place], [place] [address]       Radiology Studies: US  EKG SITE RITE Result Date: 04/27/2024 If Site Rite image not attached, placement could not be confirmed due to current cardiac rhythm.  US  Abdomen Limited RUQ (LIVER/GB) Result Date: 04/27/2024 EXAM: Right Upper Quadrant Abdominal Ultrasound 04/27/2024 05:07:30 PM TECHNIQUE: Real-time ultrasonography of the right upper quadrant of the abdomen was performed. COMPARISON: US  Abdomen Limited 09/11/2014. CLINICAL HISTORY: Abdominal pain. FINDINGS: LIVER: Normal echogenicity. No intrahepatic biliary ductal dilatation. No evidence of mass. Hepatopetal flow in the portal vein. BILIARY SYSTEM: Gallbladder wall thickness measures 0.2 cm. Mobile calcifications within the  gallbladder are consistent with gallstones. Gallbladder sludge noted within the gallbladder lumen. Negative sonographic Murphy sign. No pericholecystic fluid. The common bile duct measures 0.4 cm. RIGHT KIDNEY: The right simple renal cyst  measures 6.5 x 5 x 6 cm. No hydronephrosis. No echogenic calculi. No mass. PANCREAS: Visualized portions of the pancreas are unremarkable. OTHER: No right upper quadrant ascites. IMPRESSION: 1. Cholelithiasis and gallbladder sludge without sonographic evidence of acute cholecystitis. Electronically signed by: Morgane Naveau MD 04/27/2024 06:07 PM EST RP Workstation: [id number]   DG Chest Port 1 View Result Date: 04/27/2024 EXAM: 1 VIEW(S) XRAY OF THE CHEST 04/27/2024 02:12:43 AM COMPARISON: 04/26/2024 CLINICAL HISTORY: Shortness of breath, cough, evaluation for pulmonary edema. FINDINGS: LUNGS AND PLEURA: Stable chronic interstitial thickening. No pleural effusion. No pneumothorax. HEART AND MEDIASTINUM: No acute abnormality of the cardiac and mediastinal silhouettes. BONES AND SOFT TISSUES: Cervical spine fusion hardware. Bilateral shoulder degenerative changes. Thoracic spondylosis. No acute osseous abnormality. IMPRESSION: 1. No acute cardiopulmonary abnormality 2. Stable chronic interstitial thickening. Electronically signed by: Dorethia Molt MD 04/27/2024 02:27 AM EST RP Workstation: [id number]   CT ABDOMEN PELVIS WO CONTRAST Result Date: 04/26/2024 EXAM: CT ABDOMEN AND PELVIS WITHOUT CONTRAST 04/26/2024 06:48:20 PM TECHNIQUE: CT of the abdomen and pelvis was performed without the administration of intravenous contrast. Multiplanar reformatted images are provided for review. Automated exposure control, iterative reconstruction, and/or weight-based adjustment of the mA/kV was utilized to reduce the radiation dose to as low as reasonably achievable. COMPARISON: None available. CLINICAL HISTORY: Sepsis. Sepsis. FINDINGS: LOWER CHEST: No acute abnormality. LIVER: The liver is unremarkable. GALLBLADDER AND BILE DUCTS: Calcified gallstones within the gallbladder lumen. Question gallbladder fundus wall thickening. No pericholecystic fluid. No biliary ductal dilatation. SPLEEN: No acute abnormality. PANCREAS: No  acute abnormality. ADRENAL GLANDS: No acute abnormality. KIDNEYS, URETERS AND BLADDER: Painful increase in size of a heterogeneous 5.2 cm left renal mass with a density of 37 Hounsfield units. Grossly stable right renal mass measuring 6.1 x 6.9 cm with a density of 48 Hounsfield units. Atrophic right kidney. Nonspecific bilateral perinephric fat stranding. No nephroureterolithiasis. No hydroureteronephrosis. Urinary bladder is unremarkable. GI AND BOWEL: Stomach demonstrates no acute abnormality. No small or large bowel thickening or dilatation. The appendix is unremarkable. Colonic diverticulosis. There is no bowel obstruction. PERITONEUM AND RETROPERITONEUM: No ascites. No free air. VASCULATURE: Aorta is normal in caliber. Severe atherosclerotic plaque. LYMPH NODES: No lymphadenopathy. REPRODUCTIVE ORGANS: No acute abnormality. BONES AND SOFT TISSUES: L4 to S1 posterolateral interbody surgical hardware. Effusion. Stable grade 1 anterolisthesis of L5 on S1. Multilevel mild-to-moderate degenerative changes of the spine. Other findings suggestive of anemia. No acute osseous abnormality. No focal soft tissue abnormality. IMPRESSION: 1. Nonspecific bilateral perinephric fat stranding without nephroureterolithiasis or hydroureteronephrosis; pyelonephritis is not excluded. 2. Cholelithiasis with possible gallbladder fundus wall thickening and no pericholecystic fluid; consider right upper quadrant ultrasound if clinically indicated. 3. Enlarging heterogeneous 5.2 cm left renal mass, concerning for neoplasm; recommend urology consultation and renal protocol MRI or CT without and with IV contrast for further characterization. 4. Grossly stable 6.1 x 6.9 cm right renal mass, concerning for neoplasm; recommend urology consultation and renal protocol MRI or CT without and with IV contrast for further characterization. 5. Severe atherosclerotic plaque. Electronically signed by: Morgane Naveau MD 04/26/2024 06:54 PM EST RP  Workstation: [id number]   DG Chest Port 1 View if patient is in a treatment room. Result Date: 04/26/2024 EXAM: 1 VIEW(S) XRAY OF THE CHEST 04/26/2024 05:34:00 PM COMPARISON: 02/15/2024 CLINICAL HISTORY: Suspected sepsis.  FINDINGS: LUNGS AND PLEURA: Lower inspiration than previously. Normal lung volumes and mediastinal configuration, allowing for this. No focal pulmonary opacity. No pleural effusion. No pneumothorax. HEART AND MEDIASTINUM: No acute abnormality of the cardiac and mediastinal silhouettes. BONES AND SOFT TISSUES: Degenerative changes in the spine and shoulders. Postoperative changes in the cervical spine. IMPRESSION: 1. No acute findings. Electronically signed by: Elsie Gravely MD 04/26/2024 05:41 PM EST RP Workstation: [id number]     Scheduled Meds:  albuterol   10 mg Nebulization Once   apixaban   5 mg Oral BID   calcitRIOL   0.25 mcg Oral Daily   [START ON 04/28/2024] Chlorhexidine  Gluconate Cloth  6 each Topical Q0600   insulin  aspart  5 Units Intravenous Once   And   dextrose   1 ampule Intravenous Once   hydrocortisone  sod succinate (SOLU-CORTEF ) inj  100 mg Intravenous Once   hydrocortisone  sod succinate (SOLU-CORTEF ) inj  50 mg Intravenous Q8H   pantoprazole   40 mg Oral Daily   sodium bicarbonate   50 mEq Intravenous Once   sodium chloride  flush  3 mL Intravenous Q12H   sodium polystyrene  30 g Rectal Once   sodium zirconium cyclosilicate   5 g Oral BID   Continuous Infusions:  sodium chloride      sodium chloride      calcium  gluconate     cefTRIAXone  (ROCEPHIN )  IV 2 g (04/27/24 1739)   norepinephrine  (LEVOPHED ) Adult infusion 3 mcg/min (04/27/24 1644)   sodium chloride        LOS: 1 day    Rendall Carwin M.D on 04/27/2024 at 6:16 PM  Go to [email or web address] - for contact info  [facility] - Office  [phone]  If 7PM-7AM, please contact night-coverage [email or web address] 04/27/2024, 6:16 PM    "

## 2024-04-27 NOTE — ED Notes (Signed)
 Patient called out do to pump beeping. This medic entered the room to find the patient coughing feeling short of breath with sats in the mid 80s. Patient was placed on nasal cannula at 2 liters. Provider notified. Patient Oxygen was raised to 4 liters. ED provider notified. Orders placed.

## 2024-04-27 NOTE — TOC Initial Note (Signed)
 Transition of Care [facility]) - Initial/Assessment Note    Patient Details  Name: [name] MRN: [id number] Date of Birth: 27-Feb-1946  Transition of Care [facility]) CM/SW Contact:    Mcarthur Saddie Kim, LCSW Phone Number: 04/27/2024, 1:07 PM  Clinical Narrative:  Pt admitted with sepsis/bilateral pyelonephritis. Assessment completed due to high risk readmission score. Pt lives with her husband and is fairly independent with ADLs. No current home health services. No needs reported at this time. TOC will follow.                  Expected Discharge Plan: Home/Self Care Barriers to Discharge: Continued Medical Work up   Patient Goals and CMS Choice Patient states their goals for this hospitalization and ongoing recovery are:: return home          Expected Discharge Plan and Services In-house Referral: Clinical Social Work     Living arrangements for the past 2 months: Single Family Home                                      Prior Living Arrangements/Services Living arrangements for the past 2 months: Single Family Home Lives with:: Spouse Patient language and need for interpreter reviewed:: Yes Do you feel safe going back to the place where you live?: Yes      Need for Family Participation in Patient Care: No (Comment)     Criminal Activity/Legal Involvement Pertinent to Current Situation/Hospitalization: No - Comment as needed  Activities of Daily Living   ADL Screening (condition at time of admission) Independently performs ADLs?: No Does the patient have a NEW difficulty with bathing/dressing/toileting/self-feeding that is expected to last >3 days?: Yes (Initiates electronic notice to provider for possible OT consult) Does the patient have a NEW difficulty with getting in/out of bed, walking, or climbing stairs that is expected to last >3 days?: Yes (Initiates electronic notice to provider for possible PT consult) Does the patient have a NEW difficulty with  communication that is expected to last >3 days?: No Is the patient deaf or have difficulty hearing?: No Does the patient have difficulty seeing, even when wearing glasses/contacts?: No Does the patient have difficulty concentrating, remembering, or making decisions?: No  Permission Sought/Granted                  Emotional Assessment         Alcohol / Substance Use: Not Applicable Psych Involvement: No (comment)  Admission diagnosis:  Sepsis (HCC) [A41.9] Patient Active Problem List   Diagnosis Date Noted   Sepsis (HCC) 04/26/2024   AKI (acute kidney injury) 04/26/2024   Gout 04/26/2024   DM (diabetes mellitus) (HCC) 04/26/2024   COPD (chronic obstructive pulmonary disease) (HCC) 04/26/2024   Acute pyelonephritis 04/26/2024   Lung nodule 12/17/2023   Anemia in chronic kidney disease 01/21/2023   Benign hypertension with CKD (chronic kidney disease) stage IV (HCC) 12/12/2022   CKD stage 4 due to type 2 diabetes mellitus (HCC) 12/12/2022   GERD without esophagitis 12/12/2022   Hyperlipidemia associated with type 2 diabetes mellitus (HCC) 12/12/2022   Chronic insomnia 12/12/2022   Aortic atherosclerosis 12/12/2022   Spondylolisthesis of lumbar region 12/04/2022   Edema of lower extremity 01/02/2022   Hyperparathyroidism due to renal insufficiency 05/03/2020   Chronic kidney disease, stage 4 (severe) (HCC) 01/26/2019   Hypercalcemia 01/26/2019   Proteinuria 01/26/2019   Benign hypertensive kidney  disease with chronic kidney disease 01/26/2019   Osteoarthritis of left knee 07/26/2017   Osteoarthritis of right knee 06/20/2017   Multinodular goiter    Bilateral carotid bruits 11/08/2015   Obesity (BMI 30.0-34.9) 11/08/2015   Hyperlipidemia 11/08/2015   Gastritis and gastroduodenitis    Nausea with vomiting    Exertional chest pain 08/13/2014   Encounter for annual physical exam 05/12/2014   Hypokalemia 07/03/2012   Abdominal pain 07/01/2012   Dehydration 07/01/2012    Right kidney mass 07/01/2012   Tobacco abuse 07/01/2012   Secondary DM with peripheral vascular disease (HCC) 07/01/2012   Chronic back pain 07/01/2012   Abnormal gall bladder diagnostic imaging 07/01/2012   Homocysteinemia 11/29/2010   Deep vein thrombosis (DVT) (HCC) 11/13/2010   ARTHRITIS, LEFT KNEE 04/21/2009   Essential hypertension 04/21/2009   PCP:  [name], MD Pharmacy:   [facility] [place] [place] - [address] [place] [address] Phone: [phone] Fax: [phone]     Social Drivers of Health (SDOH) Social History: SDOH Screenings   Food Insecurity: No Food Insecurity (04/27/2024)  Housing: Low Risk (04/27/2024)  Transportation Needs: No Transportation Needs (04/27/2024)  Utilities: Not At Risk (04/27/2024)  Depression (PHQ2-9): Low Risk (04/10/2024)  Social Connections: Socially Integrated (04/27/2024)  Tobacco Use: High Risk (04/26/2024)   SDOH Interventions:     Readmission Risk Interventions    04/27/2024    1:06 PM  Readmission Risk Prevention Plan  Transportation Screening Complete  HRI or Home Care Consult Complete  Social Work Consult for Recovery Care Planning/Counseling Complete  Palliative Care Screening Not Applicable  Medication Review Oceanographer) Complete

## 2024-04-27 NOTE — Progress Notes (Signed)
 eLink Physician-Brief Progress Note Patient Name: DAMYAH GUGEL DOB: 1945/08/09 MRN: [id number]   Date of Service  04/27/2024  HPI/Events of Note  78/F with COPD, hx of DVT on eliquis , hypertension, CKD, presumed renal cell CA with severe sepsis with shock secondary to complicated UTI with E. coli bacteremia. Central line placed.  Pt is on ceftriaxone , and started on levophed .   CT abdomen  1. Nonspecific bilateral perinephric fat stranding without nephroureterolithiasis or hydroureteronephrosis; pyelonephritis is not excluded. 2. Cholelithiasis with possible gallbladder fundus wall thickening and no pericholecystic fluid; consider right upper quadrant ultrasound if clinically indicated. 3. Enlarging heterogeneous 5.2 cm left renal mass, concerning for neoplasm; recommend urology consultation and renal protocol MRI or CT without and with IV contrast for further characterization. 4. Grossly stable 6.1 x 6.9 cm right renal mass, concerning for neoplasm; recommend urology consultation and renal protocol MRI or CT without and with IV contrast for further characterization. 5. Severe atherosclerotic plaque.   Blood culture with gram negative rods, Enterobacter and E. Coli, with no ESBL identified  BP 104/44, HR 103, RR 20, O2 sats 99% on nasal cannula  eICU Interventions  Continue with ceftriaxone .  Continue levophed .  Albumin  being given now.  Check CBC, BMP, lactic acid with AM labs.  Continue with stress dose steroids.  Insulin  for glucose control.  Pantoprazole  for GI prophylaxis.  Continue apixaban .      Intervention Category Evaluation Type: New Patient Evaluation  Charlet Harr 04/27/2024, 10:47 PM

## 2024-04-27 NOTE — Evaluation (Signed)
 Physical Therapy Evaluation Patient Details Name: [name] MRN: [id number] DOB: 1945/07/28 Today's Date: 04/27/2024  History of Present Illness  [name] is a 79 y.o. female with medical history significant for homocystinemia, COPD, DVT on Eliquis , htn, goiter, CKD with baseline cr=2.8, and DMT2.  She presented to the emergency department because of 2 days of feeling very sick.  She has had fever and chills.  She is having pain in her left the left side of her abdomen and left flank.  She has had a couple episodes of vomiting over the last couple of days and just feels terrible.  In the emergency department the patient had a temp of 104.  She was tachycardic. Her lactic acid is 3.3. her wbc=19.7, her creatinine = 5.66, baseline = 2.8. Her INR=2.0.  She was COVID, flu, and RSV negative.  The patient received Maxipime  and Flagyl  in the emergency department initially because of her sepsis before it was clear what the source was.  The CT of her abdomen and pelvis revealed perinephric fat stranding bilaterally.  Cholelithiasis with possible gallbladder fundus thickening but no Perry cholecystic fluid.  A 5.2 cm left renal mass concerning for neoplasm and a stable right renal mass.  The patient will be admitted to the hospitalist service for pyelonephritis and sepsis as well as acute on chronic renal failure.    Clinical Impression  Patient lying in bed on therapist arrival.  States repeatedly she is cold.  Patient needs max A for supine to sit with max cueing for technique; needs max assist to come fully to the edge of the bed.  Once sitting; patient needs initially needs mod A for balance but decreases to CGA/min A to maintain balance after acclimating to sitting up.  Patient needs mod A for sit to stand to RW and step pivot to the chair demonstrating slow labored steps and forward flexed trunk.  Once sitting, patient states she needs to have a BM so PT and OT assist patient to the [facility] but this time  she needs much more assist; max A from chair to [facility] and back to chair.  Patient with difficulty following commands and occasionally answers questions inappropriately during treatment.  She demonstrates general weakness, cognitive impairment, gait and balance impairment and lower extremity weakness all contributing to decreased independence with functional mobility.  Patient left in chair with call button in reach, bed alarm set, husband in the room and nursing notified of mobility status.  Patient will benefit from continued skilled therapy services during the remainder of her hospital stay and at the next recommended venue of care to address deficits and promote return to optimal function.           If plan is discharge home, recommend the following: A lot of help with walking and/or transfers;A lot of help with bathing/dressing/bathroom;Assistance with cooking/housework;Help with stairs or ramp for entrance   Can travel by private vehicle   No    Equipment Recommendations None recommended by PT  Recommendations for Other Services       Functional Status Assessment Patient has had a recent decline in their functional status and demonstrates the ability to make significant improvements in function in a reasonable and predictable amount of time.     Precautions / Restrictions Precautions Precautions: Fall Recall of Precautions/Restrictions: Impaired Restrictions Weight Bearing Restrictions Per Provider Order: No      Mobility  Bed Mobility Overal bed mobility: Needs Assistance Bed Mobility: Supine to Sit  Supine to sit: Max assist, HOB elevated          Transfers Overall transfer level: Needs assistance Equipment used: Rolling walker (2 wheels) Transfers: Sit to/from Stand, Bed to chair/wheelchair/BSC Sit to Stand: Mod assist, Max assist   Step pivot transfers: Mod assist, Max assist            Ambulation/Gait Ambulation/Gait assistance: Max assist, Mod  assist Gait Distance (Feet): 1 Feet Assistive device: Rolling walker (2 wheels) Gait Pattern/deviations: Trunk flexed, Decreased step length - right, Decreased step length - left          Stairs            Wheelchair Mobility     Tilt Bed    Modified Rankin (Stroke Patients Only)       Balance Overall balance assessment: Needs assistance Sitting-balance support: Feet supported Sitting balance-Leahy Scale: Poor Sitting balance - Comments: poor to fair sitting balance on edge of bed and edge of chair   Standing balance support: Bilateral upper extremity supported, During functional activity, Reliant on assistive device for balance Standing balance-Leahy Scale: Poor Standing balance comment: poor standing balance with RW and PT assist                             Pertinent Vitals/Pain Pain Assessment Pain Assessment: Faces Faces Pain Scale: Hurts little more Pain Location: right side    Home Living Family/patient expects to be discharged to:: Private residence Living Arrangements: Spouse/significant other Available Help at Discharge: Family Type of Home: House Home Access: Stairs to enter   Secretary/administrator of Steps: 3   Home Layout: One level;Laundry or work area in [employer] Equipment: Agricultural Consultant (2 wheels);Cane - single point;Shower seat;BSC/3in1      Prior Function Prior Level of Function : Needs assist (per chart review)             Mobility Comments: SPC and RW ADLs Comments: Able to care for herself but with with increased time     Extremity/Trunk Assessment   Upper Extremity Assessment Upper Extremity Assessment: Defer to OT evaluation    Lower Extremity Assessment Lower Extremity Assessment: Generalized weakness;LLE deficits/detail LLE Deficits / Details: left side knee extension 3-    Cervical / Trunk Assessment Cervical / Trunk Assessment: Kyphotic;Back Surgery (back surgery earlier this year)   Communication   Communication Communication: Impaired Factors Affecting Communication: Difficulty expressing self;Reduced clarity of speech    Cognition Arousal: Lethargic Behavior During Therapy: Lability   PT - Cognitive impairments: Awareness, Problem solving, Safety/Judgement                       PT - Cognition Comments: husband states she is alert and oriented x 4 at baseline Following commands: Impaired Following commands impaired: Follows one step commands with increased time, Follows one step commands inconsistently     Cueing Cueing Techniques: Verbal cues, Gestural cues, Tactile cues, Visual cues     General Comments      Exercises     Assessment/Plan    PT Assessment Patient needs continued PT services  PT Problem List Decreased strength;Decreased activity tolerance;Decreased balance;Decreased mobility;Pain       PT Treatment Interventions DME instruction;Neuromuscular re-education;Gait training;Functional mobility training;Therapeutic activities;Therapeutic exercise;Balance training;Patient/family education    PT Goals (Current goals can be found in the Care Plan section)  Acute Rehab PT Goals Patient Stated Goal: return home PT Goal Formulation:  With patient/family Time For Goal Achievement: 05/11/24 Potential to Achieve Goals: Good    Frequency Min 3X/week     Co-evaluation PT/OT/SLP Co-Evaluation/Treatment: Yes Reason for Co-Treatment: Necessary to address cognition/behavior during functional activity;For patient/therapist safety;To address functional/ADL transfers PT goals addressed during session: Mobility/safety with mobility;Balance;Proper use of DME         AM-PAC PT 6 Clicks Mobility  Outcome Measure Help needed turning from your back to your side while in a flat bed without using bedrails?: A Lot Help needed moving from lying on your back to sitting on the side of a flat bed without using bedrails?: A Lot Help needed moving  to and from a bed to a chair (including a wheelchair)?: A Lot Help needed standing up from a chair using your arms (e.g., wheelchair or bedside chair)?: A Lot Help needed to walk in hospital room?: A Lot Help needed climbing 3-5 steps with a railing? : Total 6 Click Score: 11    End of Session Equipment Utilized During Treatment: Gait belt;Oxygen Activity Tolerance: Patient limited by fatigue Patient left: in chair;with family/visitor present;with call bell/phone within reach;with chair alarm set Nurse Communication: Mobility status PT Visit Diagnosis: Unsteadiness on feet (R26.81);Other abnormalities of gait and mobility (R26.89);Muscle weakness (generalized) (M62.81)    Time: 0208-0249 PT Time Calculation (min) (ACUTE ONLY): 41 min   Charges:   PT Evaluation $PT Eval Moderate Complexity: 1 Mod   PT General Charges $$ ACUTE PT VISIT: 1 Visit        3:32 PM, 04/27/24 Najee Manninen Small Raelle Chambers MPT [facility] [facility] [place] [phone] Ph:[phone]

## 2024-04-27 NOTE — Progress Notes (Signed)
 Transition of Care Department [facility]) has reviewed patient and no other TOC needs have been identified at this time. We will continue to monitor patient advancement through interdisciplinary progression rounds. If new patient transition needs arise, please place a TOC consult.   04/27/24 0759  TOC Brief Assessment  Insurance and Status Reviewed  Patient has primary care physician Yes  Home environment has been reviewed Lives with husband.  Prior level of function: Fairly independent.  Prior/Current Home Services No current home services  Social Drivers of Health Review SDOH reviewed no interventions necessary  Readmission risk has been reviewed Yes  Transition of care needs no transition of care needs at this time

## 2024-04-27 NOTE — Evaluation (Signed)
 Occupational Therapy Evaluation Patient Details Name: [name] MRN: [id number] DOB: 02-05-1946 Today's Date: 04/27/2024   History of Present Illness   [name] is a 79 y.o. female with medical history significant for homocystinemia, COPD, DVT on Eliquis , htn, goiter, CKD with baseline cr=2.8, and DMT2.  She presented to the emergency department because of 2 days of feeling very sick.  She has had fever and chills.  She is having pain in her left the left side of her abdomen and left flank.  She has had a couple episodes of vomiting over the last couple of days and just feels terrible.  In the emergency department the patient had a temp of 104.  She was tachycardic. Her lactic acid is 3.3. her wbc=19.7, her creatinine = 5.66, baseline = 2.8. Her INR=2.0.  She was COVID, flu, and RSV negative.  The patient received Maxipime  and Flagyl  in the emergency department initially because of her sepsis before it was clear what the source was.  The CT of her abdomen and pelvis revealed perinephric fat stranding bilaterally.  Cholelithiasis with possible gallbladder fundus thickening but no Perry cholecystic fluid.  A 5.2 cm left renal mass concerning for neoplasm and a stable right renal mass.  The patient will be admitted to the hospitalist service for pyelonephritis and sepsis as well as acute on chronic renal failure. (per MD)     Clinical Impressions Pt agreeable to OT and PT co-evaluation, but limited by cognition. Pt struggled to fully answer prior living function/set up. Pt's husband entered the room during the evaluation and was able to provide additional information. Pt is reportedly independent at baseline with use of AD PRN. Pt required mod to max A for bed mobility and functional transfers with RW. Pt limited by poor direction following and overall confusion. Pt demonstrates need for mod A for upper body ADL tasks and max to total for lower body with cognation being a significant limiting  factor. It was difficult to assess B UE function due to cognitive deficits. Pt left in the chair with chair alarm set, call bell within reach, and family present. Pt will benefit from continued OT in the hospital to increase strength, balance, and endurance for safe ADL's.        If plan is discharge home, recommend the following:   A lot of help with walking and/or transfers;A lot of help with bathing/dressing/bathroom;Assistance with cooking/housework;Direct supervision/assist for medications management;Assist for transportation;Help with stairs or ramp for entrance;Assistance with feeding     Functional Status Assessment   Patient has had a recent decline in their functional status and demonstrates the ability to make significant improvements in function in a reasonable and predictable amount of time.     Equipment Recommendations   None recommended by OT             Precautions/Restrictions   Precautions Precautions: Fall Recall of Precautions/Restrictions: Impaired Restrictions Weight Bearing Restrictions Per Provider Order: No     Mobility Bed Mobility Overal bed mobility: Needs Assistance Bed Mobility: Supine to Sit     Supine to sit: Max assist, HOB elevated     General bed mobility comments: B LE assist to move to EOB; assist for trunk control.    Transfers Overall transfer level: Needs assistance Equipment used: Rolling walker (2 wheels) Transfers: Sit to/from Stand, Bed to chair/wheelchair/BSC Sit to Stand: Mod assist, Max assist     Step pivot transfers: Mod assist, Max assist  General transfer comment: EOB to chair; chair to [facility] and back with RW; very poor direction following; labored effort and extended time to take steps for transer.      Balance Overall balance assessment: Needs assistance Sitting-balance support: Feet supported Sitting balance-Leahy Scale: Poor Sitting balance - Comments: poor to fair at EOB   Standing balance  support: Bilateral upper extremity supported, During functional activity, Reliant on assistive device for balance Standing balance-Leahy Scale: Poor Standing balance comment: with RW and assist.                           ADL either performed or assessed with clinical judgement   ADL Overall ADL's : Needs assistance/impaired Eating/Feeding: Moderate assistance;Sitting   Grooming: Moderate assistance;Maximal assistance;Sitting   Upper Body Bathing: Moderate assistance;Sitting;Maximal assistance   Lower Body Bathing: Maximal assistance;Sitting/lateral leans   Upper Body Dressing : Moderate assistance;Maximal assistance;Sitting   Lower Body Dressing: Maximal assistance;Total assistance;Sitting/lateral leans   Toilet Transfer: Maximal assistance;Rolling walker (2 wheels);Stand-pivot;BSC/3in1;Moderate assistance Toilet Transfer Details (indicate cue type and reason): Chair to [facility] and back Toileting- Clothing Manipulation and Hygiene: Maximal assistance;Sit to/from stand Toileting - Clothing Manipulation Details (indicate cue type and reason): Pt fixated on trying to wipe herself yet pt was trying to wipe without a rag or paper.     Functional mobility during ADLs: Moderate assistance;Maximal assistance;Rolling walker (2 wheels)       Vision Baseline Vision/History: 1 Wears glasses Ability to See in Adequate Light: 0 Adequate Patient Visual Report: No change from baseline Vision Assessment?: No apparent visual deficits     Perception Perception: Not tested       Praxis Praxis: Not tested       Pertinent Vitals/Pain Pain Assessment Pain Assessment: Faces Faces Pain Scale: Hurts little more Pain Location: flank pain Pain Descriptors / Indicators: Discomfort Pain Intervention(s): Monitored during session, Repositioned     Extremity/Trunk Assessment Upper Extremity Assessment Upper Extremity Assessment: Generalized weakness;RUE deficits/detail;LUE  deficits/detail RUE Deficits / Details: Near full P/ROM shoulder flexion. Difficult to assess due to cognition. LUE Deficits / Details: <50% available P/ROM shoulder flexion. Difficult to assess due to cognition.   Lower Extremity Assessment Lower Extremity Assessment: Defer to PT evaluation LLE Deficits / Details: left side knee extension 3-   Cervical / Trunk Assessment Cervical / Trunk Assessment: Kyphotic;Back Surgery   Communication Communication Communication: Impaired Factors Affecting Communication: Difficulty expressing self;Reduced clarity of speech   Cognition Arousal: Lethargic Behavior During Therapy: Lability, Impulsive Cognition: Cognition impaired             OT - Cognition Comments: Pt struggled to answer prior living questions and had increased difficulty following commands.                 Following commands: Impaired Following commands impaired: Follows one step commands with increased time, Follows one step commands inconsistently     Cueing  General Comments   Cueing Techniques: Verbal cues;Gestural cues;Tactile cues;Visual cues  On room air pt briefly desaturated to 72% but rose to >90 before supplemental O2 at ~1-2 LPM was even fully in place. Pt left on supplemental O2.              Home Living Family/patient expects to be discharged to:: Private residence Living Arrangements: Spouse/significant other Available Help at Discharge: Family Type of Home: House Home Access: Stairs to enter [employer] of Steps: 3 Entrance Stairs-Rails: Right;Left;Can reach both Home Layout: Laundry  or work area in basement;Two level;Able to live on main level with bedroom/bathroom     Bathroom Shower/Tub: Producer, Television/film/video: Handicapped height Bathroom Accessibility: Yes How Accessible: Accessible via wheelchair;Accessible via walker Home Equipment: Rolling Walker (2 wheels);Cane - single point;Shower seat;BSC/3in1;Wheelchair  - manual          Prior Functioning/Environment Prior Level of Function : Needs assist             Mobility Comments: SPC and RW ADLs Comments: Pt reports independence with ADL's and IADL's.    OT Problem List: Decreased strength;Decreased range of motion;Decreased activity tolerance;Impaired balance (sitting and/or standing);Decreased cognition;Decreased safety awareness;Decreased knowledge of use of DME or AE   OT Treatment/Interventions: Self-care/ADL training;Therapeutic exercise;DME and/or AE instruction;Therapeutic activities;Cognitive remediation/compensation;Patient/family education;Balance training      OT Goals(Current goals can be found in the care plan section)   Acute Rehab OT Goals Patient Stated Goal: Improve function. OT Goal Formulation: With patient/family Time For Goal Achievement: 05/11/24 Potential to Achieve Goals: Good   OT Frequency:  Min 2X/week    Co-evaluation PT/OT/SLP Co-Evaluation/Treatment: Yes Reason for Co-Treatment: To address functional/ADL transfers PT goals addressed during session: Mobility/safety with mobility;Balance;Proper use of DME OT goals addressed during session: ADL's and self-care                       End of Session Equipment Utilized During Treatment: Rolling walker (2 wheels);Gait belt;Oxygen Nurse Communication: Mobility status  Activity Tolerance: Patient tolerated treatment well;Other (comment) (Limited by cognition.) Patient left: in chair;with call bell/phone within reach;with chair alarm set  OT Visit Diagnosis: Other abnormalities of gait and mobility (R26.89);Unsteadiness on feet (R26.81);Muscle weakness (generalized) (M62.81);Other symptoms and signs involving cognitive function                Time: 8591-8550 OT Time Calculation (min): 41 min Charges:  OT General Charges $OT Visit: 1 Visit OT Evaluation $OT Eval Moderate Complexity: 1 Mod  Trumaine Wimer OT, MOT  Jayson Person 04/27/2024, 4:58 PM

## 2024-04-27 NOTE — Plan of Care (Signed)
" °  Problem: Acute Rehab OT Goals (only OT should resolve) Goal: Pt. Will Perform Grooming Flowsheets (Taken 04/27/2024 1701) Pt Will Perform Grooming:  with modified independence  standing Goal: Pt. Will Perform Upper Body Dressing Flowsheets (Taken 04/27/2024 1701) Pt Will Perform Upper Body Dressing: with modified independence Goal: Pt. Will Perform Lower Body Dressing Flowsheets (Taken 04/27/2024 1701) Pt Will Perform Lower Body Dressing: with modified independence Goal: Pt. Will Transfer To Toilet Flowsheets (Taken 04/27/2024 1701) Pt Will Transfer to Toilet: with modified independence Goal: Pt. Will Perform Toileting-Clothing Manipulation Flowsheets (Taken 04/27/2024 1701) Pt Will Perform Toileting - Clothing Manipulation and hygiene: with modified independence Goal: Pt/Caregiver Will Perform Home Exercise Program Flowsheets (Taken 04/27/2024 1701) Pt/caregiver will Perform Home Exercise Program:  Increased strength  Increased ROM  Both right and left upper extremity  Independently  Shiron Whetsel OT, MOT  "

## 2024-04-28 DIAGNOSIS — R6520 Severe sepsis without septic shock: Secondary | ICD-10-CM | POA: Diagnosis not present

## 2024-04-28 DIAGNOSIS — N184 Chronic kidney disease, stage 4 (severe): Secondary | ICD-10-CM | POA: Diagnosis not present

## 2024-04-28 DIAGNOSIS — A4151 Sepsis due to Escherichia coli [E. coli]: Secondary | ICD-10-CM

## 2024-04-28 DIAGNOSIS — I10 Essential (primary) hypertension: Secondary | ICD-10-CM | POA: Diagnosis not present

## 2024-04-28 LAB — URINALYSIS, W/ REFLEX TO CULTURE (INFECTION SUSPECTED)
Bilirubin Urine: NEGATIVE
Glucose, UA: >=500 mg/dL — AB
Ketones, ur: NEGATIVE mg/dL
Nitrite: NEGATIVE
Protein, ur: 100 mg/dL — AB
RBC / HPF: >50 RBC/hpf (ref 0–5)
Specific Gravity, Urine: 1.007 (ref 1.005–1.030)
WBC, UA: >50 WBC/hpf (ref 0–5)
pH: 6 (ref 5.0–8.0)

## 2024-04-28 LAB — CBC WITH DIFFERENTIAL/PLATELET
Abs Immature Granulocytes: 2.3 10*3/uL — ABNORMAL HIGH (ref 0.00–0.07)
Basophils Absolute: 0.1 10*3/uL (ref 0.0–0.1)
Basophils Relative: 0 %
Eosinophils Absolute: 0 10*3/uL (ref 0.0–0.5)
Eosinophils Relative: 0 %
HCT: 28.4 % — ABNORMAL LOW (ref 36.0–46.0)
Hemoglobin: 9.3 g/dL — ABNORMAL LOW (ref 12.0–15.0)
Immature Granulocytes: 7 %
Lymphocytes Relative: 2 %
Lymphs Abs: 0.6 10*3/uL — ABNORMAL LOW (ref 0.7–4.0)
MCH: 27.7 pg (ref 26.0–34.0)
MCHC: 32.7 g/dL (ref 30.0–36.0)
MCV: 84.5 fL (ref 80.0–100.0)
Monocytes Absolute: 1.3 10*3/uL — ABNORMAL HIGH (ref 0.1–1.0)
Monocytes Relative: 4 %
Neutro Abs: 29.7 10*3/uL — ABNORMAL HIGH (ref 1.7–7.7)
Neutrophils Relative %: 87 %
Platelets: 99 10*3/uL — ABNORMAL LOW (ref 150–400)
RBC: 3.36 MIL/uL — ABNORMAL LOW (ref 3.87–5.11)
RDW: 17.4 % — ABNORMAL HIGH (ref 11.5–15.5)
WBC: 34 10*3/uL — ABNORMAL HIGH (ref 4.0–10.5)
nRBC: 0.3 % — ABNORMAL HIGH (ref 0.0–0.2)

## 2024-04-28 LAB — RENAL FUNCTION PANEL
Albumin: 2.9 g/dL — ABNORMAL LOW (ref 3.5–5.0)
Anion gap: 17 — ABNORMAL HIGH (ref 5–15)
BUN: 65 mg/dL — ABNORMAL HIGH (ref 8–23)
CO2: 16 mmol/L — ABNORMAL LOW (ref 22–32)
Calcium: 8.1 mg/dL — ABNORMAL LOW (ref 8.9–10.3)
Chloride: 99 mmol/L (ref 98–111)
Creatinine, Ser: 6.45 mg/dL — ABNORMAL HIGH (ref 0.44–1.00)
GFR, Estimated: 6 mL/min — ABNORMAL LOW
Glucose, Bld: 298 mg/dL — ABNORMAL HIGH (ref 70–99)
Phosphorus: 3.8 mg/dL (ref 2.5–4.6)
Potassium: 4.8 mmol/L (ref 3.5–5.1)
Sodium: 132 mmol/L — ABNORMAL LOW (ref 135–145)

## 2024-04-28 LAB — LACTIC ACID, PLASMA: Lactic Acid, Venous: 1.8 mmol/L (ref 0.5–1.9)

## 2024-04-28 LAB — GLUCOSE, CAPILLARY
Glucose-Capillary: 276 mg/dL — ABNORMAL HIGH (ref 70–99)
Glucose-Capillary: 248 mg/dL — ABNORMAL HIGH (ref 70–99)
Glucose-Capillary: 256 mg/dL — ABNORMAL HIGH (ref 70–99)
Glucose-Capillary: 251 mg/dL — ABNORMAL HIGH (ref 70–99)

## 2024-04-28 LAB — HEMOGLOBIN A1C
Hgb A1c MFr Bld: 9.1 % — ABNORMAL HIGH (ref 4.8–5.6)
Mean Plasma Glucose: 214.47 mg/dL

## 2024-04-28 MED ORDER — SODIUM BICARBONATE 8.4 % IV SOLN
50.0000 meq | Freq: Once | INTRAVENOUS | Status: AC
  Administered 2024-04-28: 50 meq via INTRAVENOUS
  Filled 2024-04-28: qty 50

## 2024-04-28 MED ORDER — NEPRO/CARBSTEADY PO LIQD
237.0000 mL | Freq: Two times a day (BID) | ORAL | Status: DC
  Administered 2024-04-28 – 2024-04-30 (×4): 237 mL via ORAL

## 2024-04-28 MED ORDER — SODIUM CHLORIDE 0.9 % IV SOLN: INTRAVENOUS | Status: DC

## 2024-04-28 MED ORDER — HYDROCORTISONE SOD SUC (PF) 100 MG IJ SOLR
50.0000 mg | Freq: Three times a day (TID) | INTRAMUSCULAR | Status: AC
  Administered 2024-04-28 – 2024-04-29 (×4): 50 mg via INTRAVENOUS
  Filled 2024-04-28 (×4): qty 2

## 2024-04-28 MED ORDER — GLUCERNA SHAKE PO LIQD
237.0000 mL | Freq: Three times a day (TID) | ORAL | Status: DC
  Administered 2024-04-29 – 2024-04-30 (×6): 237 mL via ORAL

## 2024-04-28 MED ORDER — SODIUM BICARBONATE 650 MG PO TABS
650.0000 mg | ORAL_TABLET | Freq: Three times a day (TID) | ORAL | Status: DC
  Administered 2024-04-28 – 2024-04-30 (×8): 650 mg via ORAL
  Filled 2024-04-28 (×8): qty 1

## 2024-04-28 NOTE — Inpatient Diabetes Management (Signed)
 Inpatient Diabetes Program Recommendations  AACE/ADA: New Consensus Statement on Inpatient Glycemic Control   Target Ranges:  Prepandial:   less than 140 mg/dL      Peak postprandial:   less than 180 mg/dL (1-2 hours)      Critically ill patients:  140 - 180 mg/dL    Latest Reference Range & Units 04/27/24 20:26 04/27/24 23:14 04/28/24 07:47  Glucose-Capillary 70 - 99 mg/dL 780 (H) 707 (H) 723 (H)   Review of Glycemic Control  Diabetes history: DM2 Outpatient Diabetes medications: Jardiance  10 mg QAM, Mounjaro 2.5 mg Qweek Current orders for Inpatient glycemic control: Novolog  0-6 units TID with meals, Novolog  0-5 units at bedtime; Solucortef 50 mg Q8H   Inpatient Diabetes Program Recommendations:     Insulin : CBG 276 mg/dl this morning.  If steroids are continued as ordered, please consider ordering insulin  glargine 5 units Q24H and increasing Novolog  correction to 0-9 units TID with meals.  Thanks, Earnie Gainer, RN, MSN, CDCES Diabetes Coordinator Inpatient Diabetes Program [phone] (Team Pager from 8am to 5pm)

## 2024-04-28 NOTE — Plan of Care (Signed)
" °  Problem: Education: Goal: Knowledge of General Education information will improve Description: Including pain rating scale, medication(s)/side effects and non-pharmacologic comfort measures Outcome: Progressing   Problem: Health Behavior/Discharge Planning: Goal: Ability to manage health-related needs will improve Outcome: Progressing   Problem: Clinical Measurements: Goal: Ability to maintain clinical measurements within normal limits will improve Outcome: Progressing Goal: Will remain free from infection Outcome: Progressing Goal: Diagnostic test results will improve Outcome: Progressing Goal: Respiratory complications will improve Outcome: Progressing Goal: Cardiovascular complication will be avoided Outcome: Progressing   Problem: Activity: Goal: Risk for activity intolerance will decrease Outcome: Not Progressing   Problem: Nutrition: Goal: Adequate nutrition will be maintained Outcome: Not Progressing   Problem: Coping: Goal: Level of anxiety will decrease Outcome: Progressing   Problem: Elimination: Goal: Will not experience complications related to bowel motility Outcome: Progressing Goal: Will not experience complications related to urinary retention Outcome: Progressing   Problem: Pain Managment: Goal: General experience of comfort will improve and/or be controlled Outcome: Progressing   Problem: Safety: Goal: Ability to remain free from injury will improve Outcome: Progressing   Problem: Skin Integrity: Goal: Risk for impaired skin integrity will decrease Outcome: Progressing   Problem: Education: Goal: Ability to describe self-care measures that may prevent or decrease complications (Diabetes Survival Skills Education) will improve Outcome: Progressing Goal: Individualized Educational Video(s) Outcome: Progressing   Problem: Coping: Goal: Ability to adjust to condition or change in health will improve Outcome: Progressing   Problem: Fluid  Volume: Goal: Ability to maintain a balanced intake and output will improve Outcome: Progressing   Problem: Health Behavior/Discharge Planning: Goal: Ability to identify and utilize available resources and services will improve Outcome: Progressing Goal: Ability to manage health-related needs will improve Outcome: Progressing   Problem: Metabolic: Goal: Ability to maintain appropriate glucose levels will improve Outcome: Progressing   Problem: Nutritional: Goal: Maintenance of adequate nutrition will improve Outcome: Not Progressing Goal: Progress toward achieving an optimal weight will improve Outcome: Not Progressing   Problem: Skin Integrity: Goal: Risk for impaired skin integrity will decrease Outcome: Progressing   Problem: Tissue Perfusion: Goal: Adequacy of tissue perfusion will improve Outcome: Progressing   "

## 2024-04-28 NOTE — NC FL2 (Signed)
 " [place]  MEDICAID FL2 LEVEL OF CARE FORM     IDENTIFICATION  Patient Name: [name] Birthdate: 03/08/1946 Sex: female Admission Date (Current Location): 04/26/2024  [facility] and [place] Number:  [employer] and Address:  [facility],  [address], [place] [address]      Provider Number: [id number]  Attending Physician Name and Address:  Pearlean Manus, MD  Relative Name and Phone Number:       Current Level of Care: Hospital Recommended Level of Care: Skilled Nursing Facility Prior Approval Number:    Date Approved/Denied:   PASRR Number: [id number]  Discharge Plan: SNF    Current Diagnoses: Patient Active Problem List   Diagnosis Date Noted   Sepsis (HCC) 04/26/2024   AKI (acute kidney injury) 04/26/2024   Gout 04/26/2024   DM (diabetes mellitus) (HCC) 04/26/2024   COPD (chronic obstructive pulmonary disease) (HCC) 04/26/2024   Acute pyelonephritis 04/26/2024   Lung nodule 12/17/2023   Anemia in chronic kidney disease 01/21/2023   Stage 4 chronic kidney disease (HCC) 12/12/2022   CKD stage 4 due to type 2 diabetes mellitus (HCC) 12/12/2022   GERD without esophagitis 12/12/2022   Hyperlipidemia associated with type 2 diabetes mellitus (HCC) 12/12/2022   Chronic insomnia 12/12/2022   Aortic atherosclerosis 12/12/2022   Spondylolisthesis of lumbar region 12/04/2022   Edema of lower extremity 01/02/2022   Hyperparathyroidism due to renal insufficiency 05/03/2020   Chronic kidney disease, stage 4 (severe) (HCC) 01/26/2019   Hypercalcemia 01/26/2019   Proteinuria 01/26/2019   Benign hypertensive kidney disease with chronic kidney disease 01/26/2019   Osteoarthritis of left knee 07/26/2017   Osteoarthritis of right knee 06/20/2017   Multinodular goiter    Bilateral carotid bruits 11/08/2015   Obesity (BMI 30.0-34.9) 11/08/2015   Hyperlipidemia 11/08/2015   Gastritis and gastroduodenitis    Nausea with vomiting    Exertional  chest pain 08/13/2014   Encounter for annual physical exam 05/12/2014   Hypokalemia 07/03/2012   Abdominal pain 07/01/2012   Dehydration 07/01/2012   Right kidney mass 07/01/2012   Tobacco abuse 07/01/2012   Secondary DM with peripheral vascular disease (HCC) 07/01/2012   Chronic back pain 07/01/2012   Abnormal gall bladder diagnostic imaging 07/01/2012   Homocysteinemia 11/29/2010   Deep vein thrombosis (DVT) (HCC) 11/13/2010   ARTHRITIS, LEFT KNEE 04/21/2009   Essential hypertension 04/21/2009    Orientation RESPIRATION BLADDER Height & Weight     Self, Place  O2 (4L) Indwelling catheter Weight: 174 lb 9.7 oz (79.2 kg) Height:  5' 9 (175.3 cm)  BEHAVIORAL SYMPTOMS/MOOD NEUROLOGICAL BOWEL NUTRITION STATUS      Continent Diet (See d/c summary)  AMBULATORY STATUS COMMUNICATION OF NEEDS Skin   Extensive Assist Verbally Normal                       Personal Care Assistance Level of Assistance  Bathing, Dressing, Feeding Bathing Assistance: Maximum assistance Feeding assistance: Limited assistance Dressing Assistance: Maximum assistance     Functional Limitations Info  Sight, Hearing, Speech Sight Info: Adequate Hearing Info: Adequate Speech Info: Adequate    SPECIAL CARE FACTORS FREQUENCY  PT (By licensed PT), OT (By licensed OT)     PT Frequency: 5x weekly OT Frequency: 5x weekly            Contractures      Additional Factors Info  Code Status, Allergies, Psychotropic Code Status Info: Full Allergies Info: Ace Inhibitors  Ramipril  Levofloxacin  Celecoxib  Hydrocodone -acetaminophen   Zofran  (Ondansetron  Hcl)           Current Medications (04/28/2024):  This is the current hospital active medication list Current Facility-Administered Medications  Medication Dose Route Frequency Provider Last Rate Last Admin   0.9 %  sodium chloride  infusion  250 mL Intravenous Continuous Emokpae, Courage, MD       0.9 %  sodium chloride  infusion   Intravenous  Continuous Emokpae, Courage, MD 125 mL/hr at 04/28/24 0911 Infusion Verify at 04/28/24 0911   acetaminophen  (TYLENOL ) tablet 650 mg  650 mg Oral Q6H PRN Daniels, James K, NP   650 mg at 04/27/24 9747   Or   acetaminophen  (TYLENOL ) suppository 650 mg  650 mg Rectal Q6H PRN Daniels, James K, NP       apixaban  (ELIQUIS ) tablet 5 mg  5 mg Oral BID Claiborne, Claudia, MD   5 mg at 04/28/24 9147   bisacodyl  (DULCOLAX) EC tablet 5 mg  5 mg Oral Daily PRN Claiborne, Claudia, MD       calcitRIOL  (ROCALTROL ) capsule 0.25 mcg  0.25 mcg Oral Daily Claiborne, Claudia, MD   0.25 mcg at 04/28/24 0851   cefTRIAXone  (ROCEPHIN ) 2 g in sodium chloride  0.9 % 100 mL IVPB  2 g Intravenous Q24H Pearlean Manus, MD   Stopped at 04/27/24 1809   Chlorhexidine  Gluconate Cloth 2 % PADS 6 each  6 each Topical Q0600 Pearlean Manus, MD   6 each at 04/28/24 0514   hydrocortisone  sodium succinate  (SOLU-CORTEF ) 100 MG injection 50 mg  50 mg Intravenous Q8H Emokpae, Courage, MD       insulin  aspart (novoLOG ) injection 0-5 Units  0-5 Units Subcutaneous QHS Emokpae, Courage, MD   2 Units at 04/27/24 2132   insulin  aspart (novoLOG ) injection 0-6 Units  0-6 Units Subcutaneous TID WC Pearlean Manus, MD   3 Units at 04/28/24 0852   ipratropium-albuterol  (DUONEB) 0.5-2.5 (3) MG/3ML nebulizer solution 3 mL  3 mL Nebulization Q6H PRN Adefeso, Oladapo, DO       norepinephrine  (LEVOPHED ) 4mg  in (0.016 mg/mL) premix infusion  0-40 mcg/min Intravenous Titrated Daniels, James K, NP   Stopped at 04/28/24 0851   pantoprazole  (PROTONIX ) EC tablet 40 mg  40 mg Oral Daily Claiborne, Claudia, MD   40 mg at 04/28/24 9147   sodium chloride  flush (NS) 0.9 % injection 10-40 mL  10-40 mL Intracatheter Q12H Mavis Anes, MD   10 mL at 04/28/24 9147   sodium chloride  flush (NS) 0.9 % injection 10-40 mL  10-40 mL Intracatheter PRN Mavis Anes, MD       sodium chloride  flush (NS) 0.9 % injection 3 mL  3 mL Intravenous Q12H Arthea Child, MD    3 mL at 04/28/24 9147   sodium zirconium cyclosilicate  (LOKELMA ) packet 10 g  10 g Oral TID Pearlean Manus, MD   10 g at 04/28/24 9147     Discharge Medications: Please see discharge summary for a list of discharge medications.  Relevant Imaging Results:  Relevant Lab Results:   Additional Information SSN: [id number]  [place] Saddie Kim, LCSW     "

## 2024-04-28 NOTE — Progress Notes (Signed)
 Patient's HR was showing 140 on the cardiac monitor. Attempted to get EKG during episodes, but patient went back into a normal rate by the time I finished the EKG. Cardiac strip printed off of patient's rhythm and placed in chart. Dr. Rendall was made aware.

## 2024-04-28 NOTE — Progress Notes (Signed)
 Physical Therapy Treatment Patient Details Name: [name] MRN: [id number] DOB: March 01, 1946 Today's Date: 04/28/2024   History of Present Illness Per MD:  [name] is a 79 y.o. female with medical history significant for homocystinemia, COPD, DVT on Eliquis , htn, goiter, CKD with baseline cr=2.8, and DMT2.  She presented to the emergency department because of 2 days of feeling very sick.  She has had fever and chills.  She is having pain in her left the left side of her abdomen and left flank.  She has had a couple episodes of vomiting over the last couple of days and just feels terrible.  In the emergency department the patient had a temp of 104.  She was tachycardic. Her lactic acid is 3.3. her wbc=19.7, her creatinine = 5.66, baseline = 2.8. Her INR=2.0.  She was COVID, flu, and RSV negative.  The patient received Maxipime  and Flagyl  in the emergency department initially because of her sepsis before it was clear what the source was.  The CT of her abdomen and pelvis revealed perinephric fat stranding bilaterally.  Cholelithiasis with possible gallbladder fundus thickening but no Perry cholecystic fluid.  A 5.2 cm left renal mass concerning for neoplasm and a stable right renal mass.  The patient will be admitted to the hospitalist service for pyelonephritis and sepsis as well as acute on chronic renal failure.    PT Comments  PT agreeable to therapy although she states that she is very tired.  Nurse came into room stating that pt can not get out of bed due to femoral line placement therefore treatment focused on LE strengthening and bed mobility.     If plan is discharge home, recommend the following: A lot of help with walking and/or transfers;A lot of help with bathing/dressing/bathroom;Assistance with cooking/housework;Help with stairs or ramp for entrance   Can travel by private vehicle      Unsure at this time.   Equipment Recommendations  None recommended by PT    Recommendations  for Other Services  none     Precautions / Restrictions Precautions Precautions: Fall Recall of Precautions/Restrictions: Intact Restrictions Weight Bearing Restrictions Per Provider Order: No     Mobility  Bed Mobility Overal bed mobility: Needs Assistance Bed Mobility: Rolling Rolling: Modified independent (Device/Increase time)         General bed mobility comments: Per nurse pt can not get out of bed until femoral line is taken out.                     Communication  WNL  Cognition Arousal: Lethargic Behavior During Therapy: WFL for tasks assessed/performed                                    Cueing  Verbal   Exercises General Exercises - Lower Extremity Ankle Circles/Pumps: Both, 10 reps, Strengthening (using base board for resistance.) Quad Sets: Both, 10 reps Gluteal Sets: Both, 10 reps Short Arc Quad: Both, 10 reps Heel Slides: Both, 10 reps, AAROM Hip ABduction/ADduction: Both, 10 reps, AAROM Straight Leg Raises: Both, 5 reps, AAROM        Pertinent Vitals/Pain  No verbal complaints of pain     Home Living  Lives with husband.                             PT  Goals (current goals can now be found in the care plan section) Acute Rehab PT Goals Patient Stated Goal: return home PT Goal Formulation: With patient/family Time For Goal Achievement: 05/11/24 Potential to Achieve Goals: Good Progress towards PT goals: Not progressing toward goals - comment (due to femoral line and nursing not wanting pt to be out of bed.)    Frequency    Min 3X/week      PT Plan  SNF          End of Session   Activity Tolerance: Patient limited by fatigue Patient left: in bed;with call bell/phone within reach;with family/visitor present Nurse Communication: Mobility status PT Visit Diagnosis: Unsteadiness on feet (R26.81);Other abnormalities of gait and mobility (R26.89);Muscle weakness (generalized) (M62.81)     Time:  8787-8755 PT Time Calculation (min) (ACUTE ONLY): 32 min  Charges:    $Therapeutic Exercise: 23-37 mins PT General Charges $$ ACUTE PT VISIT: 1 Visit                     Montie Metro, PT CLT [phone]  04/28/2024, 12:46 PM

## 2024-04-28 NOTE — TOC Progression Note (Signed)
 Transition of Care [facility]) - Progression Note    Patient Details  Name: [name] MRN: [id number] Date of Birth: [birth year]  [facility] CM/SW Contact  Mcarthur Saddie Kim, [place] Phone Number: 04/28/2024, 9:38 AM  Clinical Narrative:  PT/OT recommending SNF. LCSW spoke with pt's husband about SNF and he is agreeable. He requests  or [facility]. LCSW reviewed Medicare.gov ratings and authorization process. Will initiate bed search and will start auth when appropriate.      Expected Discharge Plan: Home/Self Care Barriers to Discharge: Continued Medical Work up               Expected Discharge Plan and Services In-house Referral: Clinical Social Work     Living arrangements for the past 2 months: Single Family Home                                       Social Drivers of Health (SDOH) Interventions SDOH Screenings   Food Insecurity: No Food Insecurity (04/27/2024)  Housing: Low Risk (04/27/2024)  Transportation Needs: No Transportation Needs (04/27/2024)  Utilities: Not At Risk (04/27/2024)  Depression (PHQ2-9): Low Risk (04/10/2024)  Social Connections: Socially Integrated (04/27/2024)  Tobacco Use: High Risk (04/26/2024)    Readmission Risk Interventions    04/27/2024    1:06 PM  Readmission Risk Prevention Plan  Transportation Screening Complete  HRI or Home Care Consult Complete  Social Work Consult for Recovery Care Planning/Counseling Complete  Palliative Care Screening Not Applicable  Medication Review Oceanographer) Complete

## 2024-04-28 NOTE — Progress Notes (Signed)
 " PROGRESS NOTE  [name], is a 79 y.o. female, DOB - 1945/04/09, FMW:[id number]  Admit date - 04/26/2024   Admitting Physician Eurydice Calixto Pearlean, MD  Outpatient Primary MD for the patient is [name], MD  LOS - 2  Chief Complaint  Patient presents with   Flank Pain      Brief Narrative:  a 79 y.o. female with medical history significant for homocystinemia, COPD, H/o DVT on Eliquis , htn, goiter, CKD IV, chronic anemia of CKD (sees Dr. Woodward Brought), presumed renal carcinoma and HTN admitted on 04/26/2021 with E. coli bacteremia and severe sepsis with septic shock    -Assessment and Plan: 1)Severe E. coli sepsis with septic shock--- blood cultures from 04/26/2024 with E. Coli--- suspect this is from urinary source - Patient had mostly pansensitive E. coli back in November 2025 in the urine - Received cefepime  in the ED - Continue IV Rocephin  pending blood culture sensitivities --Added hydrocortisone  per The 2024 Society of Critical Care Medicine focused update recommendations for patients with septic shock requiring vasopressors  WBC 19.7 >>14.6 --leukocytosis is trending up again due to high-dose steroids --Right upper quadrant ultrasound without acute cholecystitis -CT abdomen and pelvis without obstructive uropathy =-Weaning of IV Levophed  - Hemodynamically improved - Continue IV fluids  2)AKI----acute kidney injury on CKD stage - IV with metabolic acidosis and hyperkalemia---due to UTI, dehydration, sepsis with septic shock with hypoperfusion - creatinine on admission=5.66  ,  baseline creatinine = 2.7 to 2.8 (02/2024)    ,  -creatinine is now= 6.45 (peak 6.48) -IV fluids  as above -- renally adjust medications, avoid nephrotoxic agents / dehydration  / hypotension -- Patient follows with nephrologist Dr. Woodward Brought at [facility]  3)presumed renal carcinoma--- imaging studies suggest possible right and left renal carcinoma -- Patient and family does not desire  further workup/investigation at this time  4)Hyperkalemia--- due to #2 above -EKG without concerning changes at this time --Patient received bicarb, Lokelma , D50/insulin , and albuterol .  Hyperkalemia cocktail protocol -- Hyperkalemia resolved after hyperkalemia cocktail  5) chronic anemia--- multifactorial in the setting of CKD 4 and underlying malignancy -Hgb currently close to -- Anticipate drop in H&H with IV fluids/hemodilution due to sepsis as above #1 - Monitor closely and transfuse as indicated  6)DM2-A1c previously around 9 - No recent A1c - Anticipate hyperglycemia with steroids as above #1 Use Novolog /Humalog Sliding scale insulin  with Accu-Cheks/Fingersticks as ordered   7) HTN--hold PTA BP meds due to septic shock with persistent hypotension  8) social/ethics--plan of care discussed with patient, patient's husband and patient's son at bedside -- Family will have further discussions amongst themselves regarding CODE STATUS for now patient remains a full code  9) mild hyponatremia---due to recurrent emesis and dehydration -Continue IV fluids   CRITICAL CARE Performed by: Rendall Pearlean   Total critical care time: 46 minutes  Critical care time was exclusive of separately billable procedures and treating other patients.  Critical care was necessary to treat or prevent imminent or life-threatening deterioration. Weaning of IV Levophed  - Hemodynamically improved  Critical care was time spent personally by me on the following activities: development of treatment plan with patient and/or surrogate as well as nursing, discussions with consultants, evaluation of patient's response to treatment, examination of patient, obtaining history from patient or surrogate, ordering and performing treatments and interventions, ordering and review of laboratory studies, ordering and review of radiographic studies, pulse oximetry and re-evaluation of patient's condition. -- Status is:  Inpatient   Disposition:  The patient is from: Home              Anticipated d/c is to: Home              Anticipated d/c date is: 2 days              Patient currently is not medically stable to d/c. Barriers: Not Clinically Stable-   Code Status :  -  Code Status: Full Code   Family Communication:    (patient is alert, awake and coherent)  - discussed with patient, patient's husband , daughter and patient's son at bedside  DVT Prophylaxis  :   - SCDs   apixaban  (ELIQUIS ) tablet 5 mg   Lab Results  Component Value Date   PLT 99 (L) 04/28/2024   Inpatient Medications  Scheduled Meds:  apixaban   5 mg Oral BID   calcitRIOL   0.25 mcg Oral Daily   Chlorhexidine  Gluconate Cloth  6 each Topical Q0600   feeding supplement (GLUCERNA SHAKE)  237 mL Oral TID   feeding supplement (NEPRO CARB STEADY)  237 mL Oral BID BM   hydrocortisone  sod succinate (SOLU-CORTEF ) inj  50 mg Intravenous Q8H   insulin  aspart  0-5 Units Subcutaneous QHS   insulin  aspart  0-6 Units Subcutaneous TID WC   pantoprazole   40 mg Oral Daily   sodium chloride  flush  10-40 mL Intracatheter Q12H   sodium chloride  flush  3 mL Intravenous Q12H   sodium zirconium cyclosilicate   10 g Oral TID   Continuous Infusions:  sodium chloride      sodium chloride  125 mL/hr at 04/28/24 1206   cefTRIAXone  (ROCEPHIN )  IV Stopped (04/27/24 1809)   norepinephrine  (LEVOPHED ) Adult infusion Stopped (04/28/24 0851)   PRN Meds:.acetaminophen  **OR** acetaminophen , bisacodyl , ipratropium-albuterol , sodium chloride  flush   Anti-infectives (From admission, onward)    Start     Dose/Rate Route Frequency Ordered Stop   04/27/24 1800  cefTRIAXone  (ROCEPHIN ) 2 g in sodium chloride  0.9 % 100 mL IVPB        2 g 200 mL/hr over 30 Minutes Intravenous Every 24 hours 04/27/24 1448     04/27/24 0600  cefTRIAXone  (ROCEPHIN ) 1 g in sodium chloride  0.9 % 100 mL IVPB  Status:  Discontinued        1 g 200 mL/hr over 30 Minutes Intravenous Every 24  hours 04/26/24 2017 04/27/24 1448   04/26/24 1745  ceFEPIme  (MAXIPIME ) 2 g in sodium chloride  0.9 % 100 mL IVPB        2 g 200 mL/hr over 30 Minutes Intravenous  Once 04/26/24 1735 04/26/24 1806   04/26/24 1745  metroNIDAZOLE  (FLAGYL ) IVPB 500 mg        500 mg 100 mL/hr over 60 Minutes Intravenous  Once 04/26/24 1735 04/26/24 1907        Subjective: Ednamae Schiano today has no fevers, no emesis,  No chest pain,    urine output improving - Eating and drinking better - Family at bedside, questions answered  Objective: Vitals:   04/28/24 1300 04/28/24 1336 04/28/24 1400 04/28/24 1556  BP: (!) 172/68  (!) 144/65   Pulse: (!) 102  99   Resp: (!) 28  19   Temp:  98.5 F (36.9 C)  98.2 F (36.8 C)  TempSrc:  Oral  Oral  SpO2: 97%  100%   Weight:      Height:        Intake/Output Summary (Last 24 hours) at 04/28/2024 1638 Last data  filed at 04/28/2024 1206 Gross per 24 hour  Intake 1943.91 ml  Output 1550 ml  Net 393.91 ml   Filed Weights   04/26/24 1721 04/27/24 1353  Weight: 73.5 kg 79.2 kg    Physical Exam  Gen:-More awake, more alert, more interactive  HEENT:- [place].AT, No sclera icterus Neck-Supple Neck,No JVD,.  Lungs-  CTAB , fair symmetrical air movement CV- S1, S2 normal, regular, tachycardic Abd-  +ve B.Sounds, Abd Soft, No tenderness, no CVA tenderness Extremity/Skin:- No  edema, pedal pulses present  Psych-affect is sleepy on and off, oriented x3 Neuro-generalized weakness, no new focal deficits, no tremors GU--Foley with good urine output  Data Reviewed: I have personally reviewed following labs and imaging studies  CBC: Recent Labs  Lab 04/26/24 1742 04/27/24 0425 04/28/24 0418  WBC 19.7* 14.6* 34.0*  NEUTROABS 16.0*  --  29.7*  HGB 10.3* 10.4* 9.3*  HCT 32.8* 32.9* 28.4*  MCV 87.7 86.6 84.5  PLT 177 147* 99*   Basic Metabolic Panel: Recent Labs  Lab 04/26/24 1742 04/27/24 0425 04/27/24 1700 04/27/24 1819 04/27/24 2219 04/28/24 0418  NA  131* 132* 131* 131*  --  132*  K 5.0 5.8* 5.6* 4.9 4.8 4.8  CL 99 100 99 101  --  99  CO2 15* 14* 13* 13*  --  16*  GLUCOSE 270* 232* 145* 157*  --  298*  BUN 55* 58* 64* 63*  --  65*  CREATININE 5.66* 6.07* 6.48* 6.22*  --  6.45*  CALCIUM  8.7* 8.4* 8.5* 8.0*  --  8.1*  PHOS  --   --  3.2  --   --  3.8   GFR: Estimated Creatinine Clearance: 7.3 mL/min (A) (by C-G formula based on SCr of 6.45 mg/dL (H)). Liver Function Tests: Recent Labs  Lab 04/26/24 1742 04/27/24 1700 04/28/24 0418  AST 32  --   --   ALT 13  --   --   ALKPHOS 110  --   --   BILITOT 0.4  --   --   PROT 6.6  --   --   ALBUMIN  3.3* 2.8* 2.9*   HbA1C: Recent Labs    04/27/24 0425  HGBA1C 9.1*    Recent Results (from the past 240 hours)  Culture, blood (Routine x 2)     Status: Abnormal (Preliminary result)   Collection Time: 04/26/24  5:40 PM   Specimen: BLOOD RIGHT FOREARM  Result Value Ref Range Status   Specimen Description   Final    BLOOD RIGHT FOREARM BOTTLES DRAWN AEROBIC AND ANAEROBIC Performed at [facility], [address], [place], [place] [address]    Special Requests   Final    Blood Culture results may not be optimal due to an inadequate volume of blood received in culture bottles Performed at [facility], [address], [place], [place] [address]    Culture  Setup Time   Final    IN BOTH AEROBIC AND ANAEROBIC BOTTLES GRAM NEGATIVE RODS Gram Stain Report Called to,Read Back By and Verified With: CSABRA DROSS RN 04/27/24 @0614  BY J. WHITE CRITICAL VALUE NOTED.  VALUE IS CONSISTENT WITH PREVIOUSLY REPORTED AND CALLED VALUE. Performed at [facility], [address], [place], [place] [address]    Culture ESCHERICHIA COLI (A)  Final   Report Status PENDING  Incomplete  Culture, blood (Routine x 2)     Status: None (Preliminary result)   Collection Time: 04/26/24  5:42 PM   Specimen: Left  Antecubital; Blood  Result Value Ref Range Status   Specimen Description   Final    LEFT  ANTECUBITAL Performed at [facility], [address] [place], [place] [address]    Special Requests   Final    BOTTLES DRAWN AEROBIC AND ANAEROBIC Blood Culture adequate volume Performed at [facility], [address] [place], [place] [address]    Culture  Setup Time   Final    GRAM NEGATIVE RODS AEROBIC BOTTLE ONLY Gram Stain Report Called to,Read Back By and Verified With: C RUSH AT 0655 ON [id number] BY S [place]AL[place] CRITICAL RESULT CALLED TO, READ BACK BY AND VERIFIED WITH: PHARMD Cherlyn Boers on [phone] @1445  by SM Performed at [facility], [address], [place], [place] [address]    Culture GRAM NEGATIVE RODS  Final   Report Status PENDING  Incomplete  Blood Culture ID Panel (Reflexed)     Status: Abnormal   Collection Time: 04/26/24  5:42 PM  Result Value Ref Range Status   Enterococcus faecalis NOT DETECTED NOT DETECTED Final   Enterococcus Faecium NOT DETECTED NOT DETECTED Final   Listeria monocytogenes NOT DETECTED NOT DETECTED Final   Staphylococcus species NOT DETECTED NOT DETECTED Final   Staphylococcus aureus (BCID) NOT DETECTED NOT DETECTED Final   Staphylococcus epidermidis NOT DETECTED NOT DETECTED Final   Staphylococcus lugdunensis NOT DETECTED NOT DETECTED Final   Streptococcus species NOT DETECTED NOT DETECTED Final   Streptococcus agalactiae NOT DETECTED NOT DETECTED Final   Streptococcus pneumoniae NOT DETECTED NOT DETECTED Final   Streptococcus pyogenes NOT DETECTED NOT DETECTED Final   A.calcoaceticus-baumannii NOT DETECTED NOT DETECTED Final   Bacteroides fragilis NOT DETECTED NOT DETECTED Final   Enterobacterales DETECTED (A) NOT DETECTED Final    Comment: Enterobacterales represent a large order of gram negative bacteria, not a single organism. CRITICAL RESULT CALLED TO, READ BACK BY AND VERIFIED WITH: PHARMD Lorie Poole on [phone] @1445  by SM    Enterobacter cloacae complex NOT DETECTED NOT DETECTED Final   Escherichia coli DETECTED (A)  NOT DETECTED Final    Comment: CRITICAL RESULT CALLED TO, READ BACK BY AND VERIFIED WITH: PHARMD Cherlyn Boers on [phone] @1445  by SM    Klebsiella aerogenes NOT DETECTED NOT DETECTED Final   Klebsiella oxytoca NOT DETECTED NOT DETECTED Final   Klebsiella pneumoniae NOT DETECTED NOT DETECTED Final   Proteus species NOT DETECTED NOT DETECTED Final   Salmonella species NOT DETECTED NOT DETECTED Final   Serratia marcescens NOT DETECTED NOT DETECTED Final   Haemophilus influenzae NOT DETECTED NOT DETECTED Final   Neisseria meningitidis NOT DETECTED NOT DETECTED Final   Pseudomonas aeruginosa NOT DETECTED NOT DETECTED Final   Stenotrophomonas maltophilia NOT DETECTED NOT DETECTED Final   Candida albicans NOT DETECTED NOT DETECTED Final   Candida auris NOT DETECTED NOT DETECTED Final   Candida glabrata NOT DETECTED NOT DETECTED Final   Candida krusei NOT DETECTED NOT DETECTED Final   Candida parapsilosis NOT DETECTED NOT DETECTED Final   Candida tropicalis NOT DETECTED NOT DETECTED Final   Cryptococcus neoformans/gattii NOT DETECTED NOT DETECTED Final   CTX-M ESBL NOT DETECTED NOT DETECTED Final   Carbapenem resistance IMP NOT DETECTED NOT DETECTED Final   Carbapenem resistance KPC NOT DETECTED NOT DETECTED Final   Carbapenem resistance NDM NOT DETECTED NOT DETECTED Final   Carbapenem resist OXA 48 LIKE NOT DETECTED NOT DETECTED Final   Carbapenem resistance VIM NOT DETECTED NOT DETECTED Final    Comment:  Performed at [facility], [address], [place], [place] [address]  Resp panel by RT-PCR (RSV, Flu A&B, Covid) Anterior Nasal Swab     Status: None   Collection Time: 04/26/24  5:47 PM   Specimen: Anterior Nasal Swab  Result Value Ref Range Status   SARS Coronavirus 2 by RT PCR NEGATIVE NEGATIVE Final    Comment: (NOTE) SARS-CoV-2 target nucleic acids are NOT DETECTED.  The SARS-CoV-2 RNA is generally detectable in upper respiratory specimens during the acute phase of infection.  The lowest concentration of SARS-CoV-2 viral copies this assay can detect is 138 copies/mL. A negative result does not preclude SARS-Cov-2 infection and should not be used as the sole basis for treatment or other patient management decisions. A negative result may occur with  improper specimen collection/handling, submission of specimen other than nasopharyngeal swab, presence of viral mutation(s) within the areas targeted by this assay, and inadequate number of viral copies(<138 copies/mL). A negative result must be combined with clinical observations, patient history, and epidemiological information. The expected result is Negative.  Fact Sheet for Patients:  bloggercourse.com  Fact Sheet for Healthcare Providers:  seriousbroker.it  This test is no t yet approved or cleared by the [place]  FDA and  has been authorized for detection and/or diagnosis of SARS-CoV-2 by FDA under an Emergency Use Authorization (EUA). This EUA will remain  in effect (meaning this test can be used) for the duration of the COVID-19 declaration under Section 564(b)(1) of the Act, 21 U.S.C.section 360bbb-3(b)(1), unless the authorization is terminated  or revoked sooner.       Influenza A by PCR NEGATIVE NEGATIVE Final   Influenza B by PCR NEGATIVE NEGATIVE Final    Comment: (NOTE) The Xpert Xpress SARS-CoV-2/FLU/RSV plus assay is intended as an aid in the diagnosis of influenza from Nasopharyngeal swab specimens and should not be used as a sole basis for treatment. Nasal washings and aspirates are unacceptable for Xpert Xpress SARS-CoV-2/FLU/RSV testing.  Fact Sheet for Patients: bloggercourse.com  Fact Sheet for Healthcare Providers: seriousbroker.it  This test is not yet approved or cleared by the [place]  FDA and has been authorized for detection and/or diagnosis of SARS-CoV-2 by FDA  under an Emergency Use Authorization (EUA). This EUA will remain in effect (meaning this test can be used) for the duration of the COVID-19 declaration under Section 564(b)(1) of the Act, 21 U.S.C. section 360bbb-3(b)(1), unless the authorization is terminated or revoked.     Resp Syncytial Virus by PCR NEGATIVE NEGATIVE Final    Comment: (NOTE) Fact Sheet for Patients: bloggercourse.com  Fact Sheet for Healthcare Providers: seriousbroker.it  This test is not yet approved or cleared by the [place]  FDA and has been authorized for detection and/or diagnosis of SARS-CoV-2 by FDA under an Emergency Use Authorization (EUA). This EUA will remain in effect (meaning this test can be used) for the duration of the COVID-19 declaration under Section 564(b)(1) of the Act, 21 U.S.C. section 360bbb-3(b)(1), unless the authorization is terminated or revoked.  Performed at [facility], [address] [place], [place] [address]   MRSA Next Gen by PCR, Nasal     Status: None   Collection Time: 04/27/24  2:00 PM   Specimen: Nasal Mucosa; Nasal Swab  Result Value Ref Range Status   MRSA by PCR Next Gen NOT DETECTED NOT DETECTED Final    Comment: (NOTE) The GeneXpert MRSA Assay (FDA approved for NASAL specimens only), is one component of a comprehensive MRSA  colonization surveillance program. It is not intended to diagnose MRSA infection nor to guide or monitor treatment for MRSA infections. Test performance is not FDA approved in patients less than 31 years old. Performed at [facility], [address] [place], [place] [address]     Radiology Studies: US  EKG SITE RITE Result Date: 04/27/2024 If Site Rite image not attached, placement could not be confirmed due to current cardiac rhythm.  US  Abdomen Limited RUQ (LIVER/GB) Result Date: 04/27/2024 EXAM: Right Upper Quadrant Abdominal Ultrasound 04/27/2024 05:07:30 PM TECHNIQUE: Real-time  ultrasonography of the right upper quadrant of the abdomen was performed. COMPARISON: US  Abdomen Limited 09/11/2014. CLINICAL HISTORY: Abdominal pain. FINDINGS: LIVER: Normal echogenicity. No intrahepatic biliary ductal dilatation. No evidence of mass. Hepatopetal flow in the portal vein. BILIARY SYSTEM: Gallbladder wall thickness measures 0.2 cm. Mobile calcifications within the gallbladder are consistent with gallstones. Gallbladder sludge noted within the gallbladder lumen. Negative sonographic Murphy sign. No pericholecystic fluid. The common bile duct measures 0.4 cm. RIGHT KIDNEY: The right simple renal cyst measures 6.5 x 5 x 6 cm. No hydronephrosis. No echogenic calculi. No mass. PANCREAS: Visualized portions of the pancreas are unremarkable. OTHER: No right upper quadrant ascites. IMPRESSION: 1. Cholelithiasis and gallbladder sludge without sonographic evidence of acute cholecystitis. Electronically signed by: Morgane Naveau MD 04/27/2024 06:07 PM EST RP Workstation: [id number]   DG Chest Port 1 View Result Date: 04/27/2024 EXAM: 1 VIEW(S) XRAY OF THE CHEST 04/27/2024 02:12:43 AM COMPARISON: 04/26/2024 CLINICAL HISTORY: Shortness of breath, cough, evaluation for pulmonary edema. FINDINGS: LUNGS AND PLEURA: Stable chronic interstitial thickening. No pleural effusion. No pneumothorax. HEART AND MEDIASTINUM: No acute abnormality of the cardiac and mediastinal silhouettes. BONES AND SOFT TISSUES: Cervical spine fusion hardware. Bilateral shoulder degenerative changes. Thoracic spondylosis. No acute osseous abnormality. IMPRESSION: 1. No acute cardiopulmonary abnormality 2. Stable chronic interstitial thickening. Electronically signed by: Dorethia Molt MD 04/27/2024 02:27 AM EST RP Workstation: [id number]   CT ABDOMEN PELVIS WO CONTRAST Result Date: 04/26/2024 EXAM: CT ABDOMEN AND PELVIS WITHOUT CONTRAST 04/26/2024 06:48:20 PM TECHNIQUE: CT of the abdomen and pelvis was performed without the administration  of intravenous contrast. Multiplanar reformatted images are provided for review. Automated exposure control, iterative reconstruction, and/or weight-based adjustment of the mA/kV was utilized to reduce the radiation dose to as low as reasonably achievable. COMPARISON: None available. CLINICAL HISTORY: Sepsis. Sepsis. FINDINGS: LOWER CHEST: No acute abnormality. LIVER: The liver is unremarkable. GALLBLADDER AND BILE DUCTS: Calcified gallstones within the gallbladder lumen. Question gallbladder fundus wall thickening. No pericholecystic fluid. No biliary ductal dilatation. SPLEEN: No acute abnormality. PANCREAS: No acute abnormality. ADRENAL GLANDS: No acute abnormality. KIDNEYS, URETERS AND BLADDER: Painful increase in size of a heterogeneous 5.2 cm left renal mass with a density of 37 Hounsfield units. Grossly stable right renal mass measuring 6.1 x 6.9 cm with a density of 48 Hounsfield units. Atrophic right kidney. Nonspecific bilateral perinephric fat stranding. No nephroureterolithiasis. No hydroureteronephrosis. Urinary bladder is unremarkable. GI AND BOWEL: Stomach demonstrates no acute abnormality. No small or large bowel thickening or dilatation. The appendix is unremarkable. Colonic diverticulosis. There is no bowel obstruction. PERITONEUM AND RETROPERITONEUM: No ascites. No free air. VASCULATURE: Aorta is normal in caliber. Severe atherosclerotic plaque. LYMPH NODES: No lymphadenopathy. REPRODUCTIVE ORGANS: No acute abnormality. BONES AND SOFT TISSUES: L4 to S1 posterolateral interbody surgical hardware. Effusion. Stable grade 1 anterolisthesis of L5 on S1. Multilevel mild-to-moderate degenerative changes of the spine. Other findings suggestive of anemia. No acute osseous abnormality. No focal soft tissue abnormality.  IMPRESSION: 1. Nonspecific bilateral perinephric fat stranding without nephroureterolithiasis or hydroureteronephrosis; pyelonephritis is not excluded. 2. Cholelithiasis with possible  gallbladder fundus wall thickening and no pericholecystic fluid; consider right upper quadrant ultrasound if clinically indicated. 3. Enlarging heterogeneous 5.2 cm left renal mass, concerning for neoplasm; recommend urology consultation and renal protocol MRI or CT without and with IV contrast for further characterization. 4. Grossly stable 6.1 x 6.9 cm right renal mass, concerning for neoplasm; recommend urology consultation and renal protocol MRI or CT without and with IV contrast for further characterization. 5. Severe atherosclerotic plaque. Electronically signed by: Morgane Naveau MD 04/26/2024 06:54 PM EST RP Workstation: [id number]   DG Chest Port 1 View if patient is in a treatment room. Result Date: 04/26/2024 EXAM: 1 VIEW(S) XRAY OF THE CHEST 04/26/2024 05:34:00 PM COMPARISON: 02/15/2024 CLINICAL HISTORY: Suspected sepsis. FINDINGS: LUNGS AND PLEURA: Lower inspiration than previously. Normal lung volumes and mediastinal configuration, allowing for this. No focal pulmonary opacity. No pleural effusion. No pneumothorax. HEART AND MEDIASTINUM: No acute abnormality of the cardiac and mediastinal silhouettes. BONES AND SOFT TISSUES: Degenerative changes in the spine and shoulders. Postoperative changes in the cervical spine. IMPRESSION: 1. No acute findings. Electronically signed by: Elsie Gravely MD 04/26/2024 05:41 PM EST RP Workstation: [id number]   Scheduled Meds:  apixaban   5 mg Oral BID   calcitRIOL   0.25 mcg Oral Daily   Chlorhexidine  Gluconate Cloth  6 each Topical Q0600   feeding supplement (GLUCERNA SHAKE)  237 mL Oral TID   feeding supplement (NEPRO CARB STEADY)  237 mL Oral BID BM   hydrocortisone  sod succinate (SOLU-CORTEF ) inj  50 mg Intravenous Q8H   insulin  aspart  0-5 Units Subcutaneous QHS   insulin  aspart  0-6 Units Subcutaneous TID WC   pantoprazole   40 mg Oral Daily   sodium chloride  flush  10-40 mL Intracatheter Q12H   sodium chloride  flush  3 mL Intravenous Q12H    sodium zirconium cyclosilicate   10 g Oral TID   Continuous Infusions:  sodium chloride      sodium chloride  125 mL/hr at 04/28/24 1206   cefTRIAXone  (ROCEPHIN )  IV Stopped (04/27/24 1809)   norepinephrine  (LEVOPHED ) Adult infusion Stopped (04/28/24 0851)    LOS: 2 days   Rendall Carwin M.D on 04/28/2024 at 4:38 PM  Go to [email or web address] - for contact info  [facility] - Office  [phone]  If 7PM-7AM, please contact night-coverage [email or web address] 04/28/2024, 4:38 PM    "

## 2024-04-28 NOTE — Plan of Care (Signed)
   Problem: Clinical Measurements: Goal: Respiratory complications will improve Outcome: Progressing Goal: Cardiovascular complication will be avoided Outcome: Progressing

## 2024-04-28 NOTE — Progress Notes (Signed)
 Initial Nutrition Assessment  DOCUMENTATION CODES:  Not applicable  INTERVENTION:  Liberalize diet to carb modified; renal restrictions are not indicated at this time (K and phosphorus are WNL). Can add renal restriction if K and phos levels become elevated. Try Nepro Shake po BID, each supplement provides 425 kcal and 19 grams protein (contains carb steady, a carbohydrate blend designed to help minimize blood glucose response).  NUTRITION DIAGNOSIS:  Increased nutrient needs related to chronic illness (COPD, CKD) as evidenced by estimated needs.  GOAL:  Patient will meet greater than or equal to 90% of their needs  MONITOR:  PO intake, Supplement acceptance  REASON FOR ASSESSMENT:  Malnutrition Screening Tool   ASSESSMENT:  79 yo female admitted with sepsis, bilateral pyelonephritis, AKI on CKD. PMH includes homocystinemia, COPD, HTN, goiter, CKD, and DM-2, kidney atrophy, tobacco use.  Patient reported recent weight loss r/t decreased appetite on admission malnutrition screening tool. She was taking Mounjaro at home, currently on hold while hospitalized. Her weight has decreased overall in the past 6 months,80.6 kg October 11, 2023 --> 73 kg April 10, 2024 but has increased to 79.2 kg this admission; ? r/t volume overload with hyponatremia, but edema has not been documented. CBG's have been elevated, likely partially r/t steroid requirement. Diabetes Program Coordinator is following and has made recommendations to adjust insulin  regimen if steroids are continued.   Admit weight: 73.5 kg Current weight: 79.2 kg   UOP: 750 ml x 24 h, 800 ml so far today  Average Meal Intake: Not documented  Nutritionally Relevant Medications: Scheduled Meds:  apixaban   5 mg Oral BID   calcitRIOL   0.25 mcg Oral Daily   Chlorhexidine  Gluconate Cloth  6 each Topical Q0600   hydrocortisone  sod succinate (SOLU-CORTEF ) inj  50 mg Intravenous Q8H   insulin  aspart  0-5 Units Subcutaneous QHS   insulin   aspart  0-6 Units Subcutaneous TID WC   pantoprazole   40 mg Oral Daily   sodium chloride  flush  10-40 mL Intracatheter Q12H   sodium chloride  flush  3 mL Intravenous Q12H   sodium zirconium cyclosilicate   10 g Oral TID   Continuous Infusions:  sodium chloride      sodium chloride  125 mL/hr at 04/28/24 1206   cefTRIAXone  (ROCEPHIN )  IV Stopped (04/27/24 1809)   norepinephrine  (LEVOPHED ) Adult infusion Stopped (04/28/24 0851)   PRN Meds:.acetaminophen  **OR** acetaminophen , bisacodyl , ipratropium-albuterol , sodium chloride  flush  Labs Reviewed: Na 132 K 4.8 WNL Phosphorus 3.8 WNL CBG ranges from 219-292 mg/dL over the last 24 hours HgbA1c 9.1   NUTRITION - FOCUSED PHYSICAL EXAM: Unable to complete  Diet Order:   Diet Order             Diet renal/carb modified with fluid restriction Diet-HS Snack? Nothing; Room service appropriate? Yes; Fluid consistency: Thin  Diet effective now                  EDUCATION NEEDS:  Not appropriate for education at this time  Skin:  Skin Assessment: Reviewed RN Assessment  Last BM:  1/27  Height:  Ht Readings from Last 1 Encounters:  04/27/24 5' 9 (1.753 m)   Weight:  Wt Readings from Last 1 Encounters:  04/27/24 79.2 kg   Ideal Body Weight:  65.9 kg  BMI:  Body mass index is 25.78 kg/m.  Estimated Nutritional Needs:  Kcal:  1600-1800 Protein:  75-85 gm Fluid:  1.6-1.8 L   Suzen HUNT RD, LDN, CNSC Contact via secure chat. If unavailable, use  group chat RD Inpatient.

## 2024-04-29 DIAGNOSIS — R7881 Bacteremia: Secondary | ICD-10-CM | POA: Diagnosis not present

## 2024-04-29 DIAGNOSIS — N10 Acute tubulo-interstitial nephritis: Secondary | ICD-10-CM | POA: Diagnosis not present

## 2024-04-29 DIAGNOSIS — I10 Essential (primary) hypertension: Secondary | ICD-10-CM | POA: Diagnosis not present

## 2024-04-29 DIAGNOSIS — N184 Chronic kidney disease, stage 4 (severe): Secondary | ICD-10-CM | POA: Diagnosis not present

## 2024-04-29 DIAGNOSIS — Z720 Tobacco use: Secondary | ICD-10-CM | POA: Diagnosis not present

## 2024-04-29 DIAGNOSIS — B9620 Unspecified Escherichia coli [E. coli] as the cause of diseases classified elsewhere: Secondary | ICD-10-CM | POA: Diagnosis not present

## 2024-04-29 DIAGNOSIS — N179 Acute kidney failure, unspecified: Secondary | ICD-10-CM | POA: Diagnosis not present

## 2024-04-29 LAB — BASIC METABOLIC PANEL WITH GFR
Anion gap: 16 — ABNORMAL HIGH (ref 5–15)
BUN: 66 mg/dL — ABNORMAL HIGH (ref 8–23)
CO2: 17 mmol/L — ABNORMAL LOW (ref 22–32)
Calcium: 8.2 mg/dL — ABNORMAL LOW (ref 8.9–10.3)
Chloride: 102 mmol/L (ref 98–111)
Creatinine, Ser: 5.83 mg/dL — ABNORMAL HIGH (ref 0.44–1.00)
GFR, Estimated: 7 mL/min — ABNORMAL LOW
Glucose, Bld: 222 mg/dL — ABNORMAL HIGH (ref 70–99)
Potassium: 3.9 mmol/L (ref 3.5–5.1)
Sodium: 134 mmol/L — ABNORMAL LOW (ref 135–145)

## 2024-04-29 LAB — CULTURE, BLOOD (ROUTINE X 2): Special Requests: ADEQUATE

## 2024-04-29 LAB — GLUCOSE, CAPILLARY
Glucose-Capillary: 222 mg/dL — ABNORMAL HIGH (ref 70–99)
Glucose-Capillary: 252 mg/dL — ABNORMAL HIGH (ref 70–99)
Glucose-Capillary: 221 mg/dL — ABNORMAL HIGH (ref 70–99)
Glucose-Capillary: 285 mg/dL — ABNORMAL HIGH (ref 70–99)

## 2024-04-29 LAB — CBC
HCT: 29.8 % — ABNORMAL LOW (ref 36.0–46.0)
Hemoglobin: 10 g/dL — ABNORMAL LOW (ref 12.0–15.0)
MCH: 27.6 pg (ref 26.0–34.0)
MCHC: 33.6 g/dL (ref 30.0–36.0)
MCV: 82.3 fL (ref 80.0–100.0)
Platelets: 79 10*3/uL — ABNORMAL LOW (ref 150–400)
RBC: 3.62 MIL/uL — ABNORMAL LOW (ref 3.87–5.11)
RDW: 17.4 % — ABNORMAL HIGH (ref 11.5–15.5)
WBC: 38.3 10*3/uL — ABNORMAL HIGH (ref 4.0–10.5)
nRBC: 0.5 % — ABNORMAL HIGH (ref 0.0–0.2)

## 2024-04-29 MED ORDER — SODIUM CHLORIDE 0.9 % IV BOLUS
250.0000 mL | Freq: Once | INTRAVENOUS | Status: AC
  Administered 2024-04-29: 250 mL via INTRAVENOUS

## 2024-04-29 MED ORDER — POLYETHYLENE GLYCOL 3350 17 G PO PACK
17.0000 g | PACK | Freq: Every day | ORAL | Status: DC
  Administered 2024-04-29 – 2024-04-30 (×2): 17 g via ORAL
  Filled 2024-04-29 (×2): qty 1

## 2024-04-29 MED ORDER — SODIUM CHLORIDE 0.9 % IV SOLN: INTRAVENOUS | Status: AC

## 2024-04-29 MED ORDER — METOPROLOL TARTRATE 5 MG/5ML IV SOLN
2.5000 mg | Freq: Once | INTRAVENOUS | Status: AC | PRN
  Administered 2024-04-29: 2.5 mg via INTRAVENOUS
  Filled 2024-04-29: qty 5

## 2024-04-29 MED ORDER — INSULIN GLARGINE-YFGN 100 UNIT/ML ~~LOC~~ SOLN
5.0000 [IU] | Freq: Once | SUBCUTANEOUS | Status: AC
  Administered 2024-04-29: 5 [IU] via SUBCUTANEOUS
  Filled 2024-04-29: qty 0.05

## 2024-04-29 MED ORDER — APIXABAN 2.5 MG PO TABS
2.5000 mg | ORAL_TABLET | Freq: Two times a day (BID) | ORAL | Status: DC
  Administered 2024-04-30 (×2): 2.5 mg via ORAL
  Filled 2024-04-29 (×2): qty 1

## 2024-04-29 MED ORDER — SENNOSIDES-DOCUSATE SODIUM 8.6-50 MG PO TABS
2.0000 | ORAL_TABLET | Freq: Every day | ORAL | Status: DC
  Administered 2024-04-29 – 2024-04-30 (×2): 2 via ORAL
  Filled 2024-04-29 (×2): qty 2

## 2024-04-29 MED ORDER — ZOLPIDEM TARTRATE 5 MG PO TABS
5.0000 mg | ORAL_TABLET | Freq: Every day | ORAL | Status: DC
  Administered 2024-04-29 – 2024-04-30 (×2): 5 mg via ORAL
  Filled 2024-04-29 (×2): qty 1

## 2024-04-29 NOTE — Inpatient Diabetes Management (Signed)
 Inpatient Diabetes Program Recommendations  AACE/ADA: New Consensus Statement on Inpatient Glycemic Control   Target Ranges:  Prepandial:   less than 140 mg/dL      Peak postprandial:   less than 180 mg/dL (1-2 hours)      Critically ill patients:  140 - 180 mg/dL    Latest Reference Range & Units 04/28/24 07:47 04/28/24 11:50 04/28/24 17:37 04/28/24 21:27 04/29/24 07:26  Glucose-Capillary 70 - 99 mg/dL 723 (H) 751 (H) 743 (H) 251 (H) 222 (H)   Review of Glycemic Control  Diabetes history: DM2 Outpatient Diabetes medications: Jardiance  10 mg QAM, Mounjaro 2.5 mg Qweek Current orders for Inpatient glycemic control: Novolog  0-6 units TID with meals, Novolog  0-5 units at bedtime; Solucortef 50 mg Q8H   Inpatient Diabetes Program Recommendations:     Insulin : CBG 248-276 mg/dl on 8/72 and 777 mg/dl this morning.  If steroids are continued as ordered, please consider ordering insulin  glargine 5 units Q24H and increasing Novolog  correction to 0-9 units TID with meals.   Thanks, Earnie Gainer, RN, MSN, CDCES Diabetes Coordinator Inpatient Diabetes Program [phone] (Team Pager from 8am to 5pm)

## 2024-04-29 NOTE — Progress Notes (Signed)
 "                                                                                                                                                         Daily Progress Note   Patient Name: [name]       Date: 04/29/2024 DOB: 1945/04/05  Age: 79 y.o. MRN#: [id number] Attending Physician: Pearlean Manus, MD Primary Care Physician: [name], MD Admit Date: 04/26/2024  Reason for Initial Consultation/Follow-up: Establishing goals of care  Subjective:  79 y.o. female  with past medical history of CKD stage IV, anemia of chronic kidney disease, COPD, arthritis, type 2 diabetes, homocystinemia, hypertension, right artery renal occlusion, multinodular goiter, presumed renal carcinoma admitted on 04/26/2024 with fever and left flank pain.    Workup revealed findings concerning for severe E. coli sepsis with septic shock felt to be secondary to urine source.  She was initiated on IV fluids and antibiotics and hydrocortisone .  She has some hypotension and is requiring vasopressor support.  Also noted to have an AKI in the setting of stage IV CKD with associated metabolic acidosis and hyperkalemia.  Patient admitted to ICU  Initial palliative care consult completed 04/28/2024.  Patient evaluated and attempted to discuss goals of care and advance care planning.  Patient quite lethargic.  Was able to have discussion with patient's family with her present and providing some insight however, patient expressed desire to think about our discussion prior to making any decisions and speak with her family.  She requested follow-up the following day to further discuss.  Today, labs reviewed.Continues to have some mild hyponatremia but improving.  Renal function continues to be significantly elevated but improving creatinine today 5.83, yesterday 6.45, baseline in the 2 range metabolic acidosis improving with CO2 of 17 compared to 16 yesterday.  Continue IV fluids and IV antibiotics.  White blood cell count  continues to be elevated up from yesterday 38.3 compared to 34 in the setting of severe infection and steroid administration clinically appears better so favor reactive leukocytosis from steroids.  Hemoglobin appears stable.  Progressive thrombocytopenia noted.  Would continue to monitor.  Reviewed interval events.  Patient did have an episode of elevated heart rate in the 140s.  Spontaneously resolved.  Vital signs reviewed.  Some mild hypertension noted.  Otherwise, no significant vital sign abnormalities that would prompt change in plan of care.  Medication administration record reviewed.  No as needed symptom meds required on 24-hour look back.  Independent history obtained from nursing staff given patient's variable cognition.  They report patient appears to be doing better today.  She continues to have stable blood pressures off of vasopressor support.  Plan is to transition to the floor today.  Visit patient in AM.  Remind her of her conversation from yesterday.  She is  agreeable to short discussion but states that she will not make any decisions about her family present and currently family has not arrived at the hospital yet.  We talked about her acute illness and that while she is making improvements we are worried about her and that she was critically ill on arrival and continues to be quite ill with the possibility for further decline.  I did discuss with her if she had given any thought to our discussion from yesterday.  She states that she is still considering.  We talked about dialysis.  She is not sure 1 way or another if she would want dialysis.  She states that she watched her brother on dialysis and saw how hard it was for him but it is also difficult for her to completely decide against dialysis if it was a life or death situation.  She will continue to think about it.  We also discussed that if her goals are to return home, avoid skilled nursing facility placement, and prioritize quality of  life we discussed the limitations and potential burdens of CPR and intubation particularly in someone with advanced age and the comorbidities that she is currently struggling with.  Discussed how the potential consequences of surviving CPR could significantly affect a person's quality of life--particularly if their definition of quality of life involves maintaining independence and functional ability.  Patient states she is not sure what she would want.  She does state that her sister-in-law was quite ill and intubated and it was difficult to watch her decline on the machine.  She does not think she would want this for herself but is not ready to make any decisions without first speaking with her family.  She does reiterate that her husband would be her health surrogate in the event she would be unable to speak for herself.  She agrees that I can come by later in the afternoon when her husband is  present to have further discussion.  Attempted come by later in the afternoon however another individual is present in the room and she request that we hold off on our discussion as this individual without a family member.  Come by a third time and patient states that without at the time and request that I follow-up tomorrow.  Husband states he will be present in the hospital around 11 or 12 tomorrow so we will plan for meeting on 04/30/2024 with patient and family to further discuss goals of care and ACP.  Chart review/care coordination:  Completed extensive chart review including EPIC notes, medication administration record, vital signs, labs. Coordinated care with bedside nursing staff, attending physician (gradual improvements made, plan to transfer out of ICU), and TOC.  Also updated patient's husband at bedside.   Length of Stay: 3   Physical Exam Constitutional:      General: She is not in acute distress.    Comments: Frail and chronically ill-appearing  Pulmonary:     Effort: Pulmonary effort is  normal. No respiratory distress.  Skin:    General: Skin is warm and dry.  Neurological:     Mental Status: She is alert.             Vital Signs: BP (!) 155/73   Pulse 91   Temp 98 F (36.7 C) (Oral)   Resp 20   Ht 5' 9 (1.753 m)   Wt 79.2 kg   LMP  (LMP Unknown)   SpO2 95%   BMI 25.78 kg/m  SpO2: SpO2: 95 % O2 Device: O2 Device: Room Air O2 Flow Rate: O2 Flow Rate (L/min): 4 L/min      Palliative Assessment/Data: Currently: 50%   Palliative Care Assessment & Plan   Patient Profile/Assessment:  79 year old female with numerous chronic medical problems admitted for severe sepsis secondary to E. coli felt to be from urinary source.  Treated with IV antibiotics, IV fluids, and IV steroids.  Required vasopressor support.  Fortunately, with the above treatments she stabilized medically and was able to be weaned off of vasopressors.  Also noted to have an AKI in the setting of stage IV CKD and a metabolic acidosis.  Labs and clinical status appears to be gradually improving.  Initial palliative meeting conducted on 04/28/2024.  Goals of care and ACP introduced but declined to make any decisions at this time.  Follow-up on 04/29/2024 for additional discussion.  Continue to discuss her goals and wishes and past experiences with healthcare in the critically ill.  However, patient is not ready to make any decisions and wishes for additional time to sitter and talk with family.  Agrees to visit on 04/30/2024 with husband and patient for follow-up.  In the interim, continue full code/full scope.  Inpatient palliative will continue to follow.  Recommendations/Plan: Full code/full scope for the time being, ongoing goals of care/ACP discussion planned Continue present management Symptoms stable therefore no changes to symptom regimen recommended Palliative medicine team will continue to follow for ongoing goals of care discussion, symptom management, and coordination of care.   Symptom  management:  Symptoms stable at present, therefore continue symptom regimen per admitting team with PMT available as needed for support   Prognosis: Guarded, improving    Discharge Planning: To Be Determined    Detailed review of medical records (labs, imaging, vital signs), medically appropriate exam, discussed with treatment team, counseling and education to patient, family, & staff, documenting clinical information, medication management, coordination of care   Total time:  I personally spent a total of 35 minutes in the care of the patient today including preparing to see the patient, performing a medically appropriate exam/evaluation, referring and communicating with other health care professionals, documenting clinical information in the EHR, and coordinating care.    Laymon CHRISTELLA Pinal, NP  Palliative Medicine Team Team phone # [phone]  Thank you for allowing the Palliative Medicine Team to assist in the care of this patient. Please utilize secure chat with additional questions, if there is no response within 30 minutes please call the above phone number.  Palliative Medicine Team providers are available by phone from 7am to 7pm daily and can be reached through the team cell phone.  Should this patient require assistance outside of these hours, please call the patient's attending physician.   "

## 2024-04-29 NOTE — Progress Notes (Signed)
 Physical Therapy Treatment Patient Details Name: [name] MRN: [id number] DOB: March 31, 1946 Today's Date: 04/29/2024   History of Present Illness Per MD:  [name] is a 79 y.o. female with medical history significant for homocystinemia, COPD, DVT on Eliquis , htn, goiter, CKD with baseline cr=2.8, and DMT2.  She presented to the emergency department because of 2 days of feeling very sick.  She has had fever and chills.  She is having pain in her left the left side of her abdomen and left flank.  She has had a couple episodes of vomiting over the last couple of days and just feels terrible.  In the emergency department the patient had a temp of 104.  She was tachycardic. Her lactic acid is 3.3. her wbc=19.7, her creatinine = 5.66, baseline = 2.8. Her INR=2.0.  She was COVID, flu, and RSV negative.  The patient received Maxipime  and Flagyl  in the emergency department initially because of her sepsis before it was clear what the source was.  The CT of her abdomen and pelvis revealed perinephric fat stranding bilaterally.  Cholelithiasis with possible gallbladder fundus thickening but no Perry cholecystic fluid.  A 5.2 cm left renal mass concerning for neoplasm and a stable right renal mass.  The patient will be admitted to the hospitalist service for pyelonephritis and sepsis as well as acute on chronic renal failure.    PT Comments  Patient agreeable for therapy. Patient demonstrates slightly labored movement for sitting up at bedside, had difficulty completing sit to stands due to BLE weakness and limited to a few steps at bedside due to c/o fatigue, weakness and having femoral line in RLE. Patient tolerated sitting up in chair after therapy. Patient will benefit from continued skilled physical therapy in hospital and recommended venue below to increase strength, balance, endurance for safe ADLs and gait.      If plan is discharge home, recommend the following: A lot of help with walking and/or  transfers;A lot of help with bathing/dressing/bathroom;Assistance with cooking/housework;Help with stairs or ramp for entrance   Can travel by private vehicle     No  Equipment Recommendations  None recommended by PT    Recommendations for Other Services       Precautions / Restrictions Precautions Precautions: Fall Precaution/Restrictions Comments: Femeral line; no trunk flexion past 90*. MD cleared pt for out of bed mobility. Restrictions Weight Bearing Restrictions Per Provider Order: No     Mobility  Bed Mobility Overal bed mobility: Needs Assistance Bed Mobility: Supine to Sit     Supine to sit: Contact guard, HOB elevated     General bed mobility comments: slightly labored movement, increased time    Transfers Overall transfer level: Needs assistance Equipment used: Rolling walker (2 wheels) Transfers: Sit to/from Stand, Bed to chair/wheelchair/BSC Sit to Stand: Mod assist   Step pivot transfers: Min assist, Mod assist       General transfer comment: had most diffiuclty completing sit to stands due to BLE weakness    Ambulation/Gait Ambulation/Gait assistance: Mod assist Gait Distance (Feet): 5 Feet Assistive device: Rolling walker (2 wheels) Gait Pattern/deviations: Decreased step length - right, Decreased step length - left, Decreased stride length Gait velocity: slow     General Gait Details: limited to a few slow labored steps at bedside due to fatigue and having femoral line in RLE   Stairs             Wheelchair Mobility     Tilt Bed  Modified Rankin (Stroke Patients Only)       Balance Overall balance assessment: Needs assistance Sitting-balance support: Feet supported, No upper extremity supported Sitting balance-Leahy Scale: Good Sitting balance - Comments: seated at EOB   Standing balance support: Bilateral upper extremity supported, During functional activity, Reliant on assistive device for balance Standing  balance-Leahy Scale: Poor Standing balance comment: fair/poor using RW                            Communication Communication Communication: No apparent difficulties  Cognition Arousal: Alert Behavior During Therapy: WFL for tasks assessed/performed                             Following commands: Intact      Cueing Cueing Techniques: Verbal cues, Tactile cues  Exercises      General Comments        Pertinent Vitals/Pain Pain Assessment Pain Assessment: No/denies pain    Home Living                          Prior Function            PT Goals (current goals can now be found in the care plan section) Acute Rehab PT Goals Patient Stated Goal: return home PT Goal Formulation: With patient/family Time For Goal Achievement: 05/11/24 Potential to Achieve Goals: Good Progress towards PT goals: Progressing toward goals    Frequency    Min 3X/week      PT Plan      Co-evaluation PT/OT/SLP Co-Evaluation/Treatment: Yes Reason for Co-Treatment: To address functional/ADL transfers PT goals addressed during session: Mobility/safety with mobility;Balance;Proper use of DME OT goals addressed during session: ADL's and self-care      AM-PAC PT 6 Clicks Mobility   Outcome Measure  Help needed turning from your back to your side while in a flat bed without using bedrails?: A Little Help needed moving from lying on your back to sitting on the side of a flat bed without using bedrails?: A Little Help needed moving to and from a bed to a chair (including a wheelchair)?: A Lot Help needed standing up from a chair using your arms (e.g., wheelchair or bedside chair)?: A Lot Help needed to walk in hospital room?: A Lot Help needed climbing 3-5 steps with a railing? : A Lot 6 Click Score: 14    End of Session   Activity Tolerance: Patient tolerated treatment well;Patient limited by fatigue Patient left: in chair;with call bell/phone within  reach Nurse Communication: Mobility status PT Visit Diagnosis: Unsteadiness on feet (R26.81);Other abnormalities of gait and mobility (R26.89);Muscle weakness (generalized) (M62.81)     Time: 9186-9154 PT Time Calculation (min) (ACUTE ONLY): 32 min  Charges:    $Therapeutic Activity: 23-37 mins PT General Charges $$ ACUTE PT VISIT: 1 Visit                     12:23 PM, 04/29/24 Lynwood Music, MPT Physical Therapist with [facility] [phone] office [phone] mobile phone

## 2024-04-29 NOTE — TOC Progression Note (Signed)
 Transition of Care [facility]) - Progression Note    Patient Details  Name: [name] MRN: [id number] Date of Birth: [birth year]  Transition of Care [facility]) CM/SW Contact  Mcarthur Saddie Kim, [place] Phone Number: 04/29/2024, 10:16 AM  Clinical Narrative:  LCSW reviewed bed offers with pt's husband who chooses [facility]. Facility notified. CMA starting auth.     Expected Discharge Plan: Home/Self Care Barriers to Discharge: Continued Medical Work up               Expected Discharge Plan and Services In-house Referral: Clinical Social Work     Living arrangements for the past 2 months: Single Family Home                                       Social Drivers of Health (SDOH) Interventions SDOH Screenings   Food Insecurity: No Food Insecurity (04/27/2024)  Housing: Low Risk (04/27/2024)  Transportation Needs: No Transportation Needs (04/27/2024)  Utilities: Not At Risk (04/27/2024)  Depression (PHQ2-9): Low Risk (04/10/2024)  Social Connections: Socially Integrated (04/27/2024)  Tobacco Use: High Risk (04/26/2024)    Readmission Risk Interventions    04/27/2024    1:06 PM  Readmission Risk Prevention Plan  Transportation Screening Complete  HRI or Home Care Consult Complete  Social Work Consult for Recovery Care Planning/Counseling Complete  Palliative Care Screening Not Applicable  Medication Review Oceanographer) Complete

## 2024-04-29 NOTE — Patient Instructions (Signed)

## 2024-04-29 NOTE — Plan of Care (Signed)

## 2024-04-29 NOTE — Progress Notes (Signed)
 Patient's Heart Rate ranging from 90s to 140s all night. EKG done while HR 140s, reads Aflutter in RVR. At first did not need any intervention and converted back to Sinus but later throughout the shift sustained to RVR HR 130s. Relayed back to NP. Given bolus and Lopressor  2.5mg  IV. Patient's HR now back to NSR at 90bpm.

## 2024-04-29 NOTE — Progress Notes (Addendum)
 " PROGRESS NOTE  [name], is a 79 y.o. female, DOB - [birth year], FMW:[id number]  Admit date - 04/26/2024   Admitting Physician Jaleel Allen Pearlean, MD  Outpatient Primary MD for the patient is [name], MD  LOS - 3  Chief Complaint  Patient presents with   Flank Pain      Brief Narrative:  a 79 y.o. female with medical history significant for homocystinemia, COPD, H/o DVT on Eliquis , htn, goiter, CKD IV, chronic anemia of CKD (sees Dr. Woodward Brought), presumed renal carcinoma and HTN admitted on 04/26/2021 with E. coli bacteremia and severe sepsis with septic shock    -Assessment and Plan: 1)Severe E. coli sepsis with septic shock--- blood cultures from 04/26/2024 with E. Coli---  this is from urinary source -Urine cultures from 04/27/2024 (urine was obtained after antibiotics were initiated) -growing E. coli -E. coli in blood cultures this admission is mostly pansensitive - Received cefepime  in the ED - Continue IV Rocephin  per blood culture sensitivity result, may discharge on Omnicef  --Added hydrocortisone  per The 2024 Society of Critical Care Medicine focused update recommendations for patients with septic shock requiring vasopressors  WBC 19.7 >>14.6 --leukocytosis is trending up again due to high-dose steroids --Right upper quadrant ultrasound without acute cholecystitis -CT abdomen and pelvis without obstructive uropathy =-Weaned of IV Levophed  completely - Hemodynamically improved -Wean off IV fluids - Okay to stop IV hydrocortisone  on 04/29/2024  2)AKI----acute kidney injury on CKD stage - IV with metabolic acidosis and hyperkalemia---due to UTI, dehydration, sepsis with septic shock with hypoperfusion - creatinine on admission=5.66  ,  baseline creatinine = 2.7 to 2.8 (02/2024)    ,  -creatinine is now= 5.83 (peak 6.48) -Okay to wean off IV fluid -- renally adjust medications, avoid nephrotoxic agents / dehydration  / hypotension -Keep bicarb supplement -- Patient  follows with nephrologist Dr. Woodward Brought at [facility]  3)presumed renal carcinoma--- imaging studies suggest possible right and left renal carcinoma -- Patient and family does not desire further workup/investigation at this time  4)Hyperkalemia--- due to #2 above -EKG without concerning changes at this time --Patient received bicarb, Lokelma , D50/insulin , and albuterol .  Hyperkalemia cocktail protocol -- Hyperkalemia resolved after hyperkalemia cocktail  5) chronic anemia--- multifactorial in the setting of CKD 4 and underlying malignancy -Hgb currently close to baseline -- Anticipate drop in H&H with IV fluids/hemodilution due to sepsis as above #1 - Monitor closely and transfuse as indicated  6)DM2-A1c is 9.1 reflecting uncontrolled DM with hyperglycemia PTA - Anticipate hyperglycemia with steroids as above #1 Use Novolog /Humalog Sliding scale insulin  with Accu-Cheks/Fingersticks as ordered   7) HTN--hold PTA BP meds due to septic shock with persistent hypotension  8)Social/Ethics--plan of care discussed with patient, patient's husband and patient's son at bedside -- Family will have further discussions amongst themselves regarding CODE STATUS for now patient remains a full code - Palliative care consult appreciated  9)Mild Hyponatremia---due to recurrent emesis and dehydration -Sodium up to 134 from 131 =-Improved okay to wean off IV fluids  10)Generalized weakness/deconditioning/ambulatory dysfunction--PT eval appreciated recommends SNF rehab  11) acute thrombocytopenia--- due to acute infection/bone marrow suppression --- No bleeding concerns - Eliquis  adjusted  12) remote history of DVT--- PTA patient was on Eliquis  5 mg twice daily -- Patient is pretty much on Eliquis  at this time for DVT prophylaxis rather than active treatment of DVT, given advanced age and renal dysfunction okay to reduce Eliquis  to 2.5 mg twice daily which should be appropriate for DVT  prophylaxis  Status is: Inpatient   Disposition: The patient is from: Home              Anticipated d/c is to: SNF              Anticipated d/c date is: 1 day              Patient currently is not medically stable to d/c. Barriers: Not Clinically Stable-   Code Status :  -  Code Status: Full Code   Family Communication:    (patient is alert, awake and coherent)  - discussed with patient, patient's husband , daughter and patient's son at bedside  DVT Prophylaxis  :   - SCDs   apixaban  (ELIQUIS ) tablet 5 mg   Lab Results  Component Value Date   PLT 79 (L) 04/29/2024   Inpatient Medications  Scheduled Meds:  apixaban   5 mg Oral BID   calcitRIOL   0.25 mcg Oral Daily   Chlorhexidine  Gluconate Cloth  6 each Topical Q0600   feeding supplement (GLUCERNA SHAKE)  237 mL Oral TID   feeding supplement (NEPRO CARB STEADY)  237 mL Oral BID BM   insulin  aspart  0-5 Units Subcutaneous QHS   insulin  aspart  0-6 Units Subcutaneous TID WC   insulin  glargine-yfgn  5 Units Subcutaneous Once   pantoprazole   40 mg Oral Daily   polyethylene glycol  17 g Oral Daily   senna-docusate  2 tablet Oral QHS   sodium bicarbonate   650 mg Oral TID   sodium chloride  flush  10-40 mL Intracatheter Q12H   sodium chloride  flush  3 mL Intravenous Q12H   zolpidem   5 mg Oral QHS   Continuous Infusions:  sodium chloride  41 mL/hr at 04/29/24 1516   cefTRIAXone  (ROCEPHIN )  IV Stopped (04/28/24 1746)   PRN Meds:.acetaminophen  **OR** acetaminophen , bisacodyl , ipratropium-albuterol , sodium chloride  flush   Anti-infectives (From admission, onward)    Start     Dose/Rate Route Frequency Ordered Stop   04/27/24 1800  cefTRIAXone  (ROCEPHIN ) 2 g in sodium chloride  0.9 % 100 mL IVPB        2 g 200 mL/hr over 30 Minutes Intravenous Every 24 hours 04/27/24 1448     04/27/24 0600  cefTRIAXone  (ROCEPHIN ) 1 g in sodium chloride  0.9 % 100 mL IVPB  Status:  Discontinued        1 g 200 mL/hr over 30 Minutes Intravenous  Every 24 hours 04/26/24 2017 04/27/24 1448   04/26/24 1745  ceFEPIme  (MAXIPIME ) 2 g in sodium chloride  0.9 % 100 mL IVPB        2 g 200 mL/hr over 30 Minutes Intravenous  Once 04/26/24 1735 04/26/24 1806   04/26/24 1745  metroNIDAZOLE  (FLAGYL ) IVPB 500 mg        500 mg 100 mL/hr over 60 Minutes Intravenous  Once 04/26/24 1735 04/26/24 1907        Subjective: Keily Lepp today has no fevers, no emesis,  No chest pain,   -more awake, interactive and appropriate -- Eating or drinking well - Required assistance to get out of bed to chair  Objective: Vitals:   04/29/24 1000 04/29/24 1100 04/29/24 1200 04/29/24 1300  BP: 138/72 (!) 155/73 (!) 158/73 (!) 157/82  Pulse: 89 91 88   Resp: (!) 23 20 18 20   Temp:      TempSrc:      SpO2: 97% 95% 96%   Weight:      Height:  Intake/Output Summary (Last 24 hours) at 04/29/2024 1610 Last data filed at 04/29/2024 1516 Gross per 24 hour  Intake 2204.82 ml  Output 2150 ml  Net 54.82 ml   Filed Weights   04/26/24 1721 04/27/24 1353  Weight: 73.5 kg 79.2 kg    Physical Exam  Gen:-Awake, alert, no acute distress  HEENT:- [place].AT, No sclera icterus Neck-Supple Neck,No JVD,.  Lungs-  CTAB , fair symmetrical air movement CV- S1, S2 normal, regular, tachycardic Abd-  +ve B.Sounds, Abd Soft, No tenderness, no CVA tenderness Extremity/Skin:- No  edema, pedal pulses present  Psych-affect is appropriate, oriented x3 Neuro-generalized weakness, no new focal deficits, no tremors GU--Foley with good urine output--Foley to be removed on 04/29/2024  Data Reviewed: I have personally reviewed following labs and imaging studies  CBC: Recent Labs  Lab 04/26/24 1742 04/27/24 0425 04/28/24 0418 04/29/24 0434  WBC 19.7* 14.6* 34.0* 38.3*  NEUTROABS 16.0*  --  29.7*  --   HGB 10.3* 10.4* 9.3* 10.0*  HCT 32.8* 32.9* 28.4* 29.8*  MCV 87.7 86.6 84.5 82.3  PLT 177 147* 99* 79*   Basic Metabolic Panel: Recent Labs  Lab 04/27/24 0425  04/27/24 1700 04/27/24 1819 04/27/24 2219 04/28/24 0418 04/29/24 0434  NA 132* 131* 131*  --  132* 134*  K 5.8* 5.6* 4.9 4.8 4.8 3.9  CL 100 99 101  --  99 102  CO2 14* 13* 13*  --  16* 17*  GLUCOSE 232* 145* 157*  --  298* 222*  BUN 58* 64* 63*  --  65* 66*  CREATININE 6.07* 6.48* 6.22*  --  6.45* 5.83*  CALCIUM  8.4* 8.5* 8.0*  --  8.1* 8.2*  PHOS  --  3.2  --   --  3.8  --    GFR: Estimated Creatinine Clearance: 8 mL/min (A) (by C-G formula based on SCr of 5.83 mg/dL (H)). Liver Function Tests: Recent Labs  Lab 04/26/24 1742 04/27/24 1700 04/28/24 0418  AST 32  --   --   ALT 13  --   --   ALKPHOS 110  --   --   BILITOT 0.4  --   --   PROT 6.6  --   --   ALBUMIN  3.3* 2.8* 2.9*   HbA1C: Recent Labs    04/27/24 0425  HGBA1C 9.1*    Recent Results (from the past 240 hours)  Culture, blood (Routine x 2)     Status: Abnormal   Collection Time: 04/26/24  5:40 PM   Specimen: BLOOD RIGHT FOREARM  Result Value Ref Range Status   Specimen Description   Final    BLOOD RIGHT FOREARM BOTTLES DRAWN AEROBIC AND ANAEROBIC Performed at [facility], [address] [place], [place] [address]    Special Requests   Final    Blood Culture results may not be optimal due to an inadequate volume of blood received in culture bottles Performed at [facility], [address], [place], [place] [address]    Culture  Setup Time   Final    IN BOTH AEROBIC AND ANAEROBIC BOTTLES GRAM NEGATIVE RODS Gram Stain Report Called to,Read Back By and Verified With: C. RUSH RN 04/27/24 @0614  BY J. WHITE CRITICAL VALUE NOTED.  VALUE IS CONSISTENT WITH PREVIOUSLY REPORTED AND CALLED VALUE.    Culture (A)  Final    ESCHERICHIA COLI SUSCEPTIBILITIES PERFORMED ON PREVIOUS CULTURE WITHIN THE LAST 5 DAYS. Performed at [facility], [address] [place], [place] [address]  Report Status 04/29/2024 FINAL  Final  Culture, blood (Routine x 2)     Status: Abnormal   Collection Time: 04/26/24   5:42 PM   Specimen: Left Antecubital; Blood  Result Value Ref Range Status   Specimen Description LEFT ANTECUBITAL  Final   Special Requests   Final    BOTTLES DRAWN AEROBIC AND ANAEROBIC Blood Culture adequate volume   Culture  Setup Time   Final    GRAM NEGATIVE RODS AEROBIC BOTTLE ONLY Gram Stain Report Called to,Read Back By and Verified With: C RUSH AT 0655 ON [id number] BY S [place] CRITICAL RESULT CALLED TO, READ BACK BY AND VERIFIED WITH: PHARMD Cherlyn Boers on [id number] @1445  by SM Performed at [facility], [address], [place], [place] [address]    Culture ESCHERICHIA COLI (A)  Final   Report Status 04/29/2024 FINAL  Final   Organism ID, Bacteria ESCHERICHIA COLI  Final      Susceptibility   Escherichia coli - MIC*    AMPICILLIN >=32 RESISTANT Resistant     CEFAZOLIN  (NON-URINE) >=32 RESISTANT Resistant     CEFEPIME  <=0.12 SENSITIVE Sensitive     ERTAPENEM <=0.12 SENSITIVE Sensitive     CEFTRIAXONE  <=0.25 SENSITIVE Sensitive     CIPROFLOXACIN  <=0.06 SENSITIVE Sensitive     GENTAMICIN <=1 SENSITIVE Sensitive     MEROPENEM <=0.25 SENSITIVE Sensitive     TRIMETH/SULFA <=20 SENSITIVE Sensitive     AMPICILLIN/SULBACTAM 16 INTERMEDIATE Intermediate     PIP/TAZO Value in next row Sensitive      <=4 SENSITIVEThis is a modified FDA-approved test that has been validated and its performance characteristics determined by the reporting laboratory.  This laboratory is certified under the Clinical Laboratory Improvement Amendments CLIA as qualified to perform high complexity clinical laboratory testing.    * ESCHERICHIA COLI  Blood Culture ID Panel (Reflexed)     Status: Abnormal   Collection Time: 04/26/24  5:42 PM  Result Value Ref Range Status   Enterococcus faecalis NOT DETECTED NOT DETECTED Final   Enterococcus Faecium NOT DETECTED NOT DETECTED Final   Listeria monocytogenes NOT DETECTED NOT DETECTED Final   Staphylococcus species NOT DETECTED NOT DETECTED Final    Staphylococcus aureus (BCID) NOT DETECTED NOT DETECTED Final   Staphylococcus epidermidis NOT DETECTED NOT DETECTED Final   Staphylococcus lugdunensis NOT DETECTED NOT DETECTED Final   Streptococcus species NOT DETECTED NOT DETECTED Final   Streptococcus agalactiae NOT DETECTED NOT DETECTED Final   Streptococcus pneumoniae NOT DETECTED NOT DETECTED Final   Streptococcus pyogenes NOT DETECTED NOT DETECTED Final   A.calcoaceticus-baumannii NOT DETECTED NOT DETECTED Final   Bacteroides fragilis NOT DETECTED NOT DETECTED Final   Enterobacterales DETECTED (A) NOT DETECTED Final    Comment: Enterobacterales represent a large order of gram negative bacteria, not a single organism. CRITICAL RESULT CALLED TO, READ BACK BY AND VERIFIED WITH: PHARMD Lorie Poole on [id number] @1445  by SM    Enterobacter cloacae complex NOT DETECTED NOT DETECTED Final   Escherichia coli DETECTED (A) NOT DETECTED Final    Comment: CRITICAL RESULT CALLED TO, READ BACK BY AND VERIFIED WITH: PHARMD Cherlyn Boers on [phone] @1445  by SM    Klebsiella aerogenes NOT DETECTED NOT DETECTED Final   Klebsiella oxytoca NOT DETECTED NOT DETECTED Final   Klebsiella pneumoniae NOT DETECTED NOT DETECTED Final   Proteus species NOT DETECTED NOT DETECTED Final   Salmonella species NOT DETECTED NOT DETECTED Final   Serratia marcescens NOT DETECTED NOT DETECTED Final  Haemophilus influenzae NOT DETECTED NOT DETECTED Final   Neisseria meningitidis NOT DETECTED NOT DETECTED Final   Pseudomonas aeruginosa NOT DETECTED NOT DETECTED Final   Stenotrophomonas maltophilia NOT DETECTED NOT DETECTED Final   Candida albicans NOT DETECTED NOT DETECTED Final   Candida auris NOT DETECTED NOT DETECTED Final   Candida glabrata NOT DETECTED NOT DETECTED Final   Candida krusei NOT DETECTED NOT DETECTED Final   Candida parapsilosis NOT DETECTED NOT DETECTED Final   Candida tropicalis NOT DETECTED NOT DETECTED Final   Cryptococcus neoformans/gattii NOT  DETECTED NOT DETECTED Final   CTX-M ESBL NOT DETECTED NOT DETECTED Final   Carbapenem resistance IMP NOT DETECTED NOT DETECTED Final   Carbapenem resistance KPC NOT DETECTED NOT DETECTED Final   Carbapenem resistance NDM NOT DETECTED NOT DETECTED Final   Carbapenem resist OXA 48 LIKE NOT DETECTED NOT DETECTED Final   Carbapenem resistance VIM NOT DETECTED NOT DETECTED Final    Comment: Performed at [facility], [address], [place], [place] [address]  Resp panel by RT-PCR (RSV, Flu A&B, Covid) Anterior Nasal Swab     Status: None   Collection Time: 04/26/24  5:47 PM   Specimen: Anterior Nasal Swab  Result Value Ref Range Status   SARS Coronavirus 2 by RT PCR NEGATIVE NEGATIVE Final    Comment: (NOTE) SARS-CoV-2 target nucleic acids are NOT DETECTED.  The SARS-CoV-2 RNA is generally detectable in upper respiratory specimens during the acute phase of infection. The lowest concentration of SARS-CoV-2 viral copies this assay can detect is 138 copies/mL. A negative result does not preclude SARS-Cov-2 infection and should not be used as the sole basis for treatment or other patient management decisions. A negative result may occur with  improper specimen collection/handling, submission of specimen other than nasopharyngeal swab, presence of viral mutation(s) within the areas targeted by this assay, and inadequate number of viral copies(<138 copies/mL). A negative result must be combined with clinical observations, patient history, and epidemiological information. The expected result is Negative.  Fact Sheet for Patients:  bloggercourse.com  Fact Sheet for Healthcare Providers:  seriousbroker.it  This test is no t yet approved or cleared by the [place]  FDA and  has been authorized for detection and/or diagnosis of SARS-CoV-2 by FDA under an Emergency Use Authorization (EUA). This EUA will remain  in effect (meaning this  test can be used) for the duration of the COVID-19 declaration under Section 564(b)(1) of the Act, 21 U.S.C.section 360bbb-3(b)(1), unless the authorization is terminated  or revoked sooner.       Influenza A by PCR NEGATIVE NEGATIVE Final   Influenza B by PCR NEGATIVE NEGATIVE Final    Comment: (NOTE) The Xpert Xpress SARS-CoV-2/FLU/RSV plus assay is intended as an aid in the diagnosis of influenza from Nasopharyngeal swab specimens and should not be used as a sole basis for treatment. Nasal washings and aspirates are unacceptable for Xpert Xpress SARS-CoV-2/FLU/RSV testing.  Fact Sheet for Patients: bloggercourse.com  Fact Sheet for Healthcare Providers: seriousbroker.it  This test is not yet approved or cleared by the [place]  FDA and has been authorized for detection and/or diagnosis of SARS-CoV-2 by FDA under an Emergency Use Authorization (EUA). This EUA will remain in effect (meaning this test can be used) for the duration of the COVID-19 declaration under Section 564(b)(1) of the Act, 21 U.S.C. section 360bbb-3(b)(1), unless the authorization is terminated or revoked.     Resp Syncytial Virus by PCR NEGATIVE NEGATIVE Final    Comment: (  NOTE) Fact Sheet for Patients: bloggercourse.com  Fact Sheet for Healthcare Providers: seriousbroker.it  This test is not yet approved or cleared by the [place]  FDA and has been authorized for detection and/or diagnosis of SARS-CoV-2 by FDA under an Emergency Use Authorization (EUA). This EUA will remain in effect (meaning this test can be used) for the duration of the COVID-19 declaration under Section 564(b)(1) of the Act, 21 U.S.C. section 360bbb-3(b)(1), unless the authorization is terminated or revoked.  Performed at [facility], [address] [place], [place] [address]   MRSA Next Gen by PCR, Nasal     Status:  None   Collection Time: 04/27/24  2:00 PM   Specimen: Nasal Mucosa; Nasal Swab  Result Value Ref Range Status   MRSA by PCR Next Gen NOT DETECTED NOT DETECTED Final    Comment: (NOTE) The GeneXpert MRSA Assay (FDA approved for NASAL specimens only), is one component of a comprehensive MRSA colonization surveillance program. It is not intended to diagnose MRSA infection nor to guide or monitor treatment for MRSA infections. Test performance is not FDA approved in patients less than 36 years old. Performed at [facility], [address] [place], [place] [address]   Culture, MAINE Urine     Status: Abnormal (Preliminary result)   Collection Time: 04/27/24 10:26 PM   Specimen: Urine, Catheterized  Result Value Ref Range Status   Specimen Description   Final    URINE, CATHETERIZED Performed at [facility], [address], [place], [place] [address]    Special Requests   Final    NONE Performed at [facility], [address], [place], [place] [address]    Culture (A)  Final    40,000 COLONIES/mL ESCHERICHIA COLI SUSCEPTIBILITIES TO FOLLOW NO GROUP B STREP (S.AGALACTIAE) ISOLATED Performed at [facility], [address], [place], [place] [address]    Report Status PENDING  Incomplete    Radiology Studies: US  EKG SITE RITE Result Date: 04/27/2024 If Site Rite image not attached, placement could not be confirmed due to current cardiac rhythm.  US  Abdomen Limited RUQ (LIVER/GB) Result Date: 04/27/2024 EXAM: Right Upper Quadrant Abdominal Ultrasound 04/27/2024 05:07:30 PM TECHNIQUE: Real-time ultrasonography of the right upper quadrant of the abdomen was performed. COMPARISON: US  Abdomen Limited 09/11/2014. CLINICAL HISTORY: Abdominal pain. FINDINGS: LIVER: Normal echogenicity. No intrahepatic biliary ductal dilatation. No evidence of mass. Hepatopetal flow in the portal vein. BILIARY SYSTEM: Gallbladder wall thickness measures 0.2 cm. Mobile calcifications within the gallbladder are  consistent with gallstones. Gallbladder sludge noted within the gallbladder lumen. Negative sonographic Murphy sign. No pericholecystic fluid. The common bile duct measures 0.4 cm. RIGHT KIDNEY: The right simple renal cyst measures 6.5 x 5 x 6 cm. No hydronephrosis. No echogenic calculi. No mass. PANCREAS: Visualized portions of the pancreas are unremarkable. OTHER: No right upper quadrant ascites. IMPRESSION: 1. Cholelithiasis and gallbladder sludge without sonographic evidence of acute cholecystitis. Electronically signed by: Morgane Naveau MD 04/27/2024 06:07 PM EST RP Workstation: [id number]   Scheduled Meds:  apixaban   5 mg Oral BID   calcitRIOL   0.25 mcg Oral Daily   Chlorhexidine  Gluconate Cloth  6 each Topical Q0600   feeding supplement (GLUCERNA SHAKE)  237 mL Oral TID   feeding supplement (NEPRO CARB STEADY)  237 mL Oral BID BM   insulin  aspart  0-5 Units Subcutaneous QHS   insulin  aspart  0-6 Units Subcutaneous TID WC   insulin  glargine-yfgn  5 Units Subcutaneous Once   pantoprazole   40  mg Oral Daily   polyethylene glycol  17 g Oral Daily   senna-docusate  2 tablet Oral QHS   sodium bicarbonate   650 mg Oral TID   sodium chloride  flush  10-40 mL Intracatheter Q12H   sodium chloride  flush  3 mL Intravenous Q12H   zolpidem   5 mg Oral QHS   Continuous Infusions:  sodium chloride  41 mL/hr at 04/29/24 1516   cefTRIAXone  (ROCEPHIN )  IV Stopped (04/28/24 1746)    LOS: 3 days   Rendall Carwin M.D on 04/29/2024 at 4:10 PM  Go to [email or web address] - for contact info  [facility] - Office  [phone]  If 7PM-7AM, please contact night-coverage [email or web address] 04/29/2024, 4:10 PM    "

## 2024-04-29 NOTE — Progress Notes (Signed)
 Occupational Therapy Treatment Patient Details Name: [name] MRN: [id number] DOB: 1945-07-24 Today's Date: 04/29/2024   History of present illness Per MD:  [name] is a 79 y.o. female with medical history significant for homocystinemia, COPD, DVT on Eliquis , htn, goiter, CKD with baseline cr=2.8, and DMT2.  She presented to the emergency department because of 2 days of feeling very sick.  She has had fever and chills.  She is having pain in her left the left side of her abdomen and left flank.  She has had a couple episodes of vomiting over the last couple of days and just feels terrible.  In the emergency department the patient had a temp of 104.  She was tachycardic. Her lactic acid is 3.3. her wbc=19.7, her creatinine = 5.66, baseline = 2.8. Her INR=2.0.  She was COVID, flu, and RSV negative.  The patient received Maxipime  and Flagyl  in the emergency department initially because of her sepsis before it was clear what the source was.  The CT of her abdomen and pelvis revealed perinephric fat stranding bilaterally.  Cholelithiasis with possible gallbladder fundus thickening but no Perry cholecystic fluid.  A 5.2 cm left renal mass concerning for neoplasm and a stable right renal mass.  The patient will be admitted to the hospitalist service for pyelonephritis and sepsis as well as acute on chronic renal failure.   OT comments  Pt agreeable to OT and PT co-treatment. Pt much more cognitively aware and able to follow directions. Pt has femoral line but the MD cleared the pt for mobility prior to the session. Pt required mod A for step pivot transfers to the [facility] and the chair with RW. Pt required only cGA for bed mobility with HOB elevated. Pt did require max A for peri-care while standing after a bowel movement. Pt tolerated B UE strengthening while seated but was limited to x5 reps due to weakness. Pt also not reaching full range for shoulder flexion while seated. Pt left in the chair with call  bell within reach. Pt will benefit from continued OT in the hospital to increase strength, balance, and endurance for safe ADL's.       If plan is discharge home, recommend the following:  A lot of help with walking and/or transfers;A lot of help with bathing/dressing/bathroom;Assistance with cooking/housework;Direct supervision/assist for medications management;Assist for transportation;Help with stairs or ramp for entrance   Equipment Recommendations  None recommended by OT          Precautions / Restrictions Precautions Precautions: Fall Recall of Precautions/Restrictions: Intact Precaution/Restrictions Comments: Femeral line; no trunk flexion past 90*. MD cleared pt for out of bed mobility. Restrictions Weight Bearing Restrictions Per Provider Order: No       Mobility Bed Mobility Overal bed mobility: Needs Assistance Bed Mobility: Supine to Sit     Supine to sit: Contact guard, HOB elevated     General bed mobility comments: Extended time and labored effort.    Transfers Overall transfer level: Needs assistance Equipment used: Rolling walker (2 wheels) Transfers: Sit to/from Stand, Bed to chair/wheelchair/BSC Sit to Stand: Mod assist     Step pivot transfers: Min assist, Mod assist     General transfer comment: EOB to [facility] then BSC to chair with RW.     Balance Overall balance assessment: Needs assistance Sitting-balance support: No upper extremity supported, Feet supported Sitting balance-Leahy Scale: Good Sitting balance - Comments: seated at EOB   Standing balance support: Bilateral upper extremity supported, During  functional activity, Reliant on assistive device for balance Standing balance-Leahy Scale: Poor Standing balance comment: poor to fair with RW.                           ADL either performed or assessed with clinical judgement   ADL Overall ADL's : Needs assistance/impaired                         Toilet Transfer:  Moderate assistance;BSC/3in1;Stand-pivot Toilet Transfer Details (indicate cue type and reason): EOB to [facility] with RW. Toileting- Clothing Manipulation and Hygiene: Maximal assistance;Sit to/from stand Toileting - Clothing Manipulation Details (indicate cue type and reason): Max A for peri-care after bowel movement while standing at [facility].       General ADL Comments: Limited in ADL tasks due to precautions from femoral line.    Extremity/Trunk Assessment Upper Extremity Assessment Upper Extremity Assessment: Generalized weakness (3-/5 shoulder flexion MMT; near full P/ROM of the same motion. Generally weak.)                             Communication Communication Communication: No apparent difficulties   Cognition                                     Following commands: Intact        Cueing   Cueing Techniques: Verbal cues, Tactile cues  Exercises Exercises: General Upper Extremity General Exercises - Upper Extremity Shoulder Flexion: AROM, Both, Limitations, 5 reps Shoulder Flexion Limitations: Achieving ~75% of avaialble A/ROM while seated. Shoulder ABduction: AROM, Both, 5 reps (x5 protraction as well) Shoulder Horizontal ABduction: AROM, Both, 5 reps, Seated (x5 ER/IR as well while seated.)                 Pertinent Vitals/ Pain       Pain Assessment Pain Assessment: Faces Faces Pain Scale: No hurt                                                          Frequency  Min 2X/week        Progress Toward Goals  OT Goals(current goals can now be found in the care plan section)  Progress towards OT goals: Progressing toward goals  Acute Rehab OT Goals Patient Stated Goal: Improve function. OT Goal Formulation: With patient/family Time For Goal Achievement: 05/11/24 Potential to Achieve Goals: Good ADL Goals Pt Will Perform Grooming: with modified independence;standing Pt Will Perform Upper Body Dressing: with  modified independence Pt Will Perform Lower Body Dressing: with modified independence Pt Will Transfer to Toilet: with modified independence Pt Will Perform Toileting - Clothing Manipulation and hygiene: with modified independence Pt/caregiver will Perform Home Exercise Program: Increased strength;Increased ROM;Both right and left upper extremity;Independently  Plan      Co-evaluation    PT/OT/SLP Co-Evaluation/Treatment: Yes Reason for Co-Treatment: To address functional/ADL transfers   OT goals addressed during session: ADL's and self-care                          End of Session Equipment Utilized During Treatment:  Rolling walker (2 wheels)  OT Visit Diagnosis: Other abnormalities of gait and mobility (R26.89);Unsteadiness on feet (R26.81);Muscle weakness (generalized) (M62.81);Other symptoms and signs involving cognitive function   Activity Tolerance Patient tolerated treatment well   Patient Left in chair;with call bell/phone within reach   Nurse Communication          Time: 0825-0902 OT Time Calculation (min): 37 min  Charges: OT General Charges $OT Visit: 1 Visit OT Treatments $Therapeutic Exercise: 8-22 mins (1 unit due to co-treat with PT)  JAYSON PERSON OT, MOT  Jayson Person 04/29/2024, 9:23 AM

## 2024-04-30 DIAGNOSIS — Z720 Tobacco use: Secondary | ICD-10-CM | POA: Diagnosis not present

## 2024-04-30 DIAGNOSIS — N184 Chronic kidney disease, stage 4 (severe): Secondary | ICD-10-CM | POA: Diagnosis not present

## 2024-04-30 DIAGNOSIS — B9620 Unspecified Escherichia coli [E. coli] as the cause of diseases classified elsewhere: Secondary | ICD-10-CM

## 2024-04-30 DIAGNOSIS — I10 Essential (primary) hypertension: Secondary | ICD-10-CM | POA: Diagnosis not present

## 2024-04-30 DIAGNOSIS — N179 Acute kidney failure, unspecified: Secondary | ICD-10-CM | POA: Diagnosis not present

## 2024-04-30 DIAGNOSIS — R7881 Bacteremia: Secondary | ICD-10-CM

## 2024-04-30 LAB — RENAL FUNCTION PANEL
Albumin: 2.7 g/dL — ABNORMAL LOW (ref 3.5–5.0)
Anion gap: 14 (ref 5–15)
BUN: 72 mg/dL — ABNORMAL HIGH (ref 8–23)
CO2: 18 mmol/L — ABNORMAL LOW (ref 22–32)
Calcium: 8.7 mg/dL — ABNORMAL LOW (ref 8.9–10.3)
Chloride: 102 mmol/L (ref 98–111)
Creatinine, Ser: 5.81 mg/dL — ABNORMAL HIGH (ref 0.44–1.00)
GFR, Estimated: 7 mL/min — ABNORMAL LOW
Glucose, Bld: 210 mg/dL — ABNORMAL HIGH (ref 70–99)
Phosphorus: 4.6 mg/dL (ref 2.5–4.6)
Potassium: 3.7 mmol/L (ref 3.5–5.1)
Sodium: 134 mmol/L — ABNORMAL LOW (ref 135–145)

## 2024-04-30 LAB — CULTURE, OB URINE: Culture: 40000 — AB

## 2024-04-30 LAB — GLUCOSE, CAPILLARY
Glucose-Capillary: 187 mg/dL — ABNORMAL HIGH (ref 70–99)
Glucose-Capillary: 219 mg/dL — ABNORMAL HIGH (ref 70–99)
Glucose-Capillary: 232 mg/dL — ABNORMAL HIGH (ref 70–99)
Glucose-Capillary: 203 mg/dL — ABNORMAL HIGH (ref 70–99)

## 2024-04-30 MED ORDER — GLUCERNA SHAKE PO LIQD: 237.0000 mL | Freq: Two times a day (BID) | ORAL | 1 refills | Status: AC

## 2024-04-30 MED ORDER — CEFDINIR 300 MG PO CAPS: 300.0000 mg | ORAL_CAPSULE | Freq: Two times a day (BID) | ORAL | 0 refills | Status: AC

## 2024-04-30 MED ORDER — ZOLPIDEM TARTRATE 5 MG PO TABS: 5.0000 mg | ORAL_TABLET | Freq: Every evening | ORAL | 0 refills | Status: AC | PRN

## 2024-04-30 MED ORDER — POLYETHYLENE GLYCOL 3350 17 G PO PACK: 17.0000 g | PACK | Freq: Every day | ORAL | 1 refills | Status: AC

## 2024-04-30 MED ORDER — ALBUTEROL SULFATE HFA 108 (90 BASE) MCG/ACT IN AERS: 1.0000 | INHALATION_SPRAY | Freq: Four times a day (QID) | RESPIRATORY_TRACT | 3 refills | Status: AC | PRN

## 2024-04-30 MED ORDER — GLIMEPIRIDE 2 MG PO TABS: 2.0000 mg | ORAL_TABLET | Freq: Every day | ORAL | 1 refills | Status: AC

## 2024-04-30 MED ORDER — HYDROCODONE-ACETAMINOPHEN 5-325 MG PO TABS: 1.0000 | ORAL_TABLET | Freq: Three times a day (TID) | ORAL | 0 refills | Status: AC | PRN

## 2024-04-30 MED ORDER — SODIUM CHLORIDE 0.9 % IV SOLN
2.0000 g | Freq: Once | INTRAVENOUS | Status: AC
  Administered 2024-04-30: 2 g via INTRAVENOUS
  Filled 2024-04-30: qty 20

## 2024-04-30 MED ORDER — SENNOSIDES-DOCUSATE SODIUM 8.6-50 MG PO TABS: 2.0000 | ORAL_TABLET | Freq: Every day | ORAL | 5 refills | Status: AC

## 2024-04-30 MED ORDER — SODIUM BICARBONATE 650 MG PO TABS: 650.0000 mg | ORAL_TABLET | Freq: Three times a day (TID) | ORAL | 5 refills | Status: AC

## 2024-04-30 MED ORDER — APIXABAN 2.5 MG PO TABS: 2.5000 mg | ORAL_TABLET | Freq: Two times a day (BID) | ORAL | 5 refills | Status: AC

## 2024-04-30 MED ORDER — CEFDINIR 300 MG PO CAPS: 300.0000 mg | ORAL_CAPSULE | Freq: Two times a day (BID) | ORAL | 0 refills | Status: DC

## 2024-04-30 MED ORDER — ACETAMINOPHEN 325 MG PO TABS: 650.0000 mg | ORAL_TABLET | Freq: Four times a day (QID) | ORAL | Status: AC | PRN

## 2024-04-30 NOTE — Discharge Instructions (Signed)
 1)Watch for bleeding while on Blood Thinners--watch for blood in your stool which can make your stool black, maroon, mahogany or red---, blood in your urine which can make your urine pink or red, nosebleeds , also watch for possible bruising -You are taking Apixaban /Eliquis --- which is a blood thinner--- be careful to avoid injury or falls  2)Stop Jardiance  (empagliflozin ) due to recurrent urinary tract infections  3)Avoid ibuprofen /Advil /Aleve/Motrin [name]/Naproxen/BC powders/Meloxicam/Diclofenac /Indomethacin and other Nonsteroidal anti-inflammatory medications as these will make you more likely to bleed and can cause stomach ulcers, can also cause Kidney problems.   4)Repeat CBC and BMP blood tests in about a week

## 2024-04-30 NOTE — Discharge Summary (Signed)
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 Final   Klebsiella oxytoca NOT DETECTED NOT DETECTED Final   Klebsiella pneumoniae NOT DETECTED NOT DETECTED Final   Proteus species NOT DETECTED NOT DETECTED Final   Salmonella species NOT DETECTED NOT DETECTED Final   Serratia marcescens NOT DETECTED NOT DETECTED Final   Haemophilus influenzae NOT DETECTED NOT DETECTED Final   Neisseria meningitidis NOT DETECTED NOT DETECTED Final   Pseudomonas aeruginosa NOT DETECTED NOT DETECTED Final   Stenotrophomonas maltophilia NOT DETECTED NOT DETECTED Final   Candida albicans NOT DETECTED NOT DETECTED Final   Candida auris NOT DETECTED NOT DETECTED Final   Candida glabrata NOT DETECTED NOT DETECTED Final   Candida krusei NOT DETECTED NOT  DETECTED Final   Candida parapsilosis NOT DETECTED NOT DETECTED Final   Candida tropicalis NOT DETECTED NOT DETECTED Final   Cryptococcus neoformans/gattii NOT DETECTED NOT DETECTED Final   CTX-M ESBL NOT DETECTED NOT  DETECTED Final   Carbapenem resistance IMP NOT DETECTED NOT DETECTED Final   Carbapenem resistance KPC NOT DETECTED NOT DETECTED Final   Carbapenem resistance NDM NOT DETECTED NOT DETECTED Final   Carbapenem resist OXA 48 LIKE NOT DETECTED NOT DETECTED Final   Carbapenem resistance VIM NOT DETECTED NOT DETECTED Final    Comment: Performed at [facility], [address], [place], [place] [address]  Resp panel by RT-PCR (RSV, Flu A&B, Covid) Anterior Nasal Swab     Status: None   Collection Time: 04/26/24  5:47 PM   Specimen: Anterior Nasal Swab  Result Value Ref Range Status   SARS Coronavirus 2 by RT PCR NEGATIVE NEGATIVE Final    Comment: (NOTE) SARS-CoV-2 target nucleic acids are NOT DETECTED.  The SARS-CoV-2 RNA is generally detectable in upper respiratory specimens during the acute phase of infection. The lowest concentration of SARS-CoV-2 viral copies this assay can detect is 138 copies/mL. A negative result does not preclude SARS-Cov-2 infection and should not be used as the sole basis for treatment or other patient management decisions. A negative result may occur with  improper specimen collection/handling, submission of specimen other than nasopharyngeal swab, presence of viral mutation(s) within the areas targeted by this assay, and inadequate number of viral copies(<138 copies/mL). A negative result must be combined with clinical observations, patient history, and epidemiological information. The expected result is Negative.  Fact Sheet for Patients:  bloggercourse.com  Fact Sheet for Healthcare Providers:  seriousbroker.it  This test is no t yet approved or cleared by the [place]  FDA and  has been authorized for detection and/or diagnosis of SARS-CoV-2 by FDA under an Emergency Use Authorization (EUA). This EUA will remain  in effect (meaning this test can be used) for the duration of the COVID-19  declaration under Section 564(b)(1) of the Act, 21 U.S.C.section 360bbb-3(b)(1), unless the authorization is terminated  or revoked sooner.       Influenza A by PCR NEGATIVE NEGATIVE Final   Influenza B by PCR NEGATIVE NEGATIVE Final    Comment: (NOTE) The Xpert Xpress SARS-CoV-2/FLU/RSV plus assay is intended as an aid in the diagnosis of influenza from Nasopharyngeal swab specimens and should not be used as a sole basis for treatment. Nasal washings and aspirates are unacceptable for Xpert Xpress SARS-CoV-2/FLU/RSV testing.  Fact Sheet for Patients: bloggercourse.com  Fact Sheet for Healthcare Providers: seriousbroker.it  This test is not yet approved or cleared by the [place]  FDA and has been authorized for detection and/or diagnosis of SARS-CoV-2 by FDA under an Emergency Use Authorization (EUA). This EUA will remain in effect (meaning this test can be used) for the duration of the COVID-19 declaration under Section 564(b)(1) of the Act, 21 U.S.C. section 360bbb-3(b)(1), unless the authorization is terminated or revoked.     Resp Syncytial Virus by PCR NEGATIVE NEGATIVE Final    Comment: (NOTE) Fact Sheet for Patients: bloggercourse.com  Fact Sheet for Healthcare Providers: seriousbroker.it  This test is not yet approved or cleared by the [place]  FDA and has been authorized for detection and/or diagnosis of SARS-CoV-2 by FDA under an Emergency Use Authorization (EUA). This EUA will remain in effect (meaning this test can be used) for the duration of the COVID-19 declaration under Section 564(b)(1) of  the Act, 21 U.S.C. section 360bbb-3(b)(1), unless the authorization is terminated or revoked.  Performed at [facility], [address] [place], [place] [address]   MRSA Next Gen by PCR, Nasal     Status: None   Collection Time: 04/27/24  2:00 PM   Specimen:  Nasal Mucosa; Nasal Swab  Result Value Ref Range Status   MRSA by PCR Next Gen NOT DETECTED NOT DETECTED Final    Comment: (NOTE) The GeneXpert MRSA Assay (FDA approved for NASAL specimens only), is one component of a comprehensive MRSA colonization surveillance program. It is not intended to diagnose MRSA infection nor to guide or monitor treatment for MRSA infections. Test performance is not FDA approved in patients less than 61 years old. Performed at [facility], [address] [place], [place] [address]   Culture, MAINE Urine     Status: Abnormal   Collection Time: 04/27/24 10:26 PM   Specimen: Urine, Catheterized  Result Value Ref Range Status   Specimen Description   Final    URINE, CATHETERIZED Performed at [facility], [address], [place], [place] [address]    Special Requests   Final    NONE Performed at [facility], [address] [place], [place] [address]    Culture (A)  Final    40,000 COLONIES/mL ESCHERICHIA COLI NO GROUP B STREP (S.AGALACTIAE) ISOLATED Performed at [facility], [address], [place], [place] [address]    Report Status 04/30/2024 FINAL  Final   Organism ID, Bacteria ESCHERICHIA COLI (A)  Final      Susceptibility   Escherichia coli - MIC*    AMPICILLIN >=32 RESISTANT Resistant     CEFAZOLIN  (URINE) Value in next row Sensitive      16 SENSITIVEThis is a modified FDA-approved test that has been validated and its performance characteristics determined by the reporting laboratory.  This laboratory is certified under the Clinical Laboratory Improvement Amendments CLIA as qualified to perform high complexity clinical laboratory testing.    CEFEPIME  Value in next row Sensitive      16 SENSITIVEThis is a modified FDA-approved test that has been validated and its performance characteristics determined by the reporting laboratory.  This laboratory is certified under the Clinical Laboratory Improvement Amendments CLIA as qualified to perform high  complexity clinical laboratory testing.    ERTAPENEM Value in next row Sensitive      16 SENSITIVEThis is a modified FDA-approved test that has been validated and its performance characteristics determined by the reporting laboratory.  This laboratory is certified under the Clinical Laboratory Improvement Amendments CLIA as qualified to perform high complexity clinical laboratory testing.    CEFTRIAXONE  Value in next row Sensitive      16 SENSITIVEThis is a modified FDA-approved test that has been validated and its performance characteristics determined by the reporting laboratory.  This laboratory is certified under the Clinical Laboratory Improvement Amendments CLIA as qualified to perform high complexity clinical laboratory testing.    CIPROFLOXACIN  Value in next row Sensitive      16 SENSITIVEThis is a modified FDA-approved test that has been validated and its performance characteristics determined by the reporting laboratory.  This laboratory is certified under the Clinical Laboratory Improvement Amendments CLIA as qualified to perform high complexity clinical laboratory testing.    GENTAMICIN Value in next row Sensitive      16 SENSITIVEThis is a modified FDA-approved test that has been validated and its performance characteristics determined by the reporting laboratory.  This laboratory  is certified under the Clinical Laboratory Improvement Amendments CLIA as qualified to perform high complexity clinical laboratory testing.    NITROFURANTOIN Value in next row Sensitive      16 SENSITIVEThis is a modified FDA-approved test that has been validated and its performance characteristics determined by the reporting laboratory.  This laboratory is certified under the Clinical Laboratory Improvement Amendments CLIA as qualified to perform high complexity clinical laboratory testing.    TRIMETH/SULFA Value in next row Sensitive      16 SENSITIVEThis is a modified FDA-approved test that has been validated  and its performance characteristics determined by the reporting laboratory.  This laboratory is certified under the Clinical Laboratory Improvement Amendments CLIA as qualified to perform high complexity clinical laboratory testing.    AMPICILLIN/SULBACTAM Value in next row Intermediate      16 SENSITIVEThis is a modified FDA-approved test that has been validated and its performance characteristics determined by the reporting laboratory.  This laboratory is certified under the Clinical Laboratory Improvement Amendments CLIA as qualified to perform high complexity clinical laboratory testing.    PIP/TAZO Value in next row Sensitive      <=4 SENSITIVEThis is a modified FDA-approved test that has been validated and its performance characteristics determined by the reporting laboratory.  This laboratory is certified under the Clinical Laboratory Improvement Amendments CLIA as qualified to perform high complexity clinical laboratory testing.    MEROPENEM Value in next row Sensitive      <=4 SENSITIVEThis is a modified FDA-approved test that has been validated and its performance characteristics determined by the reporting laboratory.  This laboratory is certified under the Clinical Laboratory Improvement Amendments CLIA as qualified to perform high complexity clinical laboratory testing.    * 40,000 COLONIES/mL ESCHERICHIA COLI    Today   Subjective    Shardae Kleinman today has no new complaints  - Husband and daughter at bedside, questions answered - Eating or drinking well No fever  Or chills   No Nausea, Vomiting or Diarrhea --Voiding well after Foley removal          Patient has been seen and examined prior to discharge   Objective   Blood pressure (!) 155/81, pulse 84, temperature 97.8 F (36.6 C), temperature source Oral, resp. rate 20, height 5' 9 (1.753 m), weight 79.2 kg, SpO2 95%.   Intake/Output Summary (Last 24 hours) at 04/30/2024 1421 Last data filed at 04/30/2024 0923 Gross per  24 hour  Intake 422.14 ml  Output --  Net 422.14 ml   Exam Gen:- Awake Alert, no acute distress  HEENT:- Vian.AT, No sclera icterus Neck-Supple Neck,No JVD,.  Lungs-  CTAB , good air movement bilaterally CV- S1, S2 normal, regular Abd-  +ve B.Sounds, Abd Soft, No tenderness, no CVA area tenderness Extremity/Skin:- No  edema,   good pulses Psych-affect is appropriate, oriented x3 Neuro-generalized weakness, no new focal deficits, no tremors    Data Review   CBC w Diff:  Lab Results  Component Value Date   WBC 38.3 (H) 04/29/2024   HGB 10.0 (L) 04/29/2024   HCT 29.8 (L) 04/29/2024   PLT 79 (L) 04/29/2024   LYMPHOPCT 2 04/28/2024   BANDSPCT 9 04/26/2024   MONOPCT 4 04/28/2024   EOSPCT 0 04/28/2024   BASOPCT 0 04/28/2024    CMP:  Lab Results  Component Value Date   NA 134 (L) 04/30/2024   K 3.7 04/30/2024   CL 102 04/30/2024   CO2 18 (L) 04/30/2024   BUN 72 (  H) 04/30/2024   CREATININE 5.81 (H) 04/30/2024   PROT 6.6 04/26/2024   ALBUMIN  2.7 (L) 04/30/2024   BILITOT 0.4 04/26/2024   ALKPHOS 110 04/26/2024   AST 32 04/26/2024   ALT 13 04/26/2024   Total Discharge time is about 33 minutes  [name] M.D on 04/30/2024 at 2:21 PM  Go to [email or web address] -  for contact info  [facility] - Office  [phone]   "

## 2024-04-30 NOTE — Plan of Care (Signed)
" °  Problem: Education: Goal: Knowledge of General Education information will improve Description: Including pain rating scale, medication(s)/side effects and non-pharmacologic comfort measures Outcome: Progressing   Problem: Clinical Measurements: Goal: Ability to maintain clinical measurements within normal limits will improve Outcome: Progressing Goal: Will remain free from infection Outcome: Progressing Goal: Diagnostic test results will improve Outcome: Progressing Goal: Respiratory complications will improve Outcome: Progressing Goal: Cardiovascular complication will be avoided Outcome: Progressing   Problem: Activity: Goal: Risk for activity intolerance will decrease Outcome: Progressing   Problem: Coping: Goal: Level of anxiety will decrease Outcome: Progressing   Problem: Pain Managment: Goal: General experience of comfort will improve and/or be controlled Outcome: Progressing   Problem: Coping: Goal: Ability to adjust to condition or change in health will improve Outcome: Progressing   Problem: Nutritional: Goal: Maintenance of adequate nutrition will improve Outcome: Progressing Goal: Progress toward achieving an optimal weight will improve Outcome: Progressing   "

## 2024-04-30 NOTE — Plan of Care (Signed)
   Problem: Education: Goal: Knowledge of General Education information will improve Description: Including pain rating scale, medication(s)/side effects and non-pharmacologic comfort measures Outcome: Progressing   Problem: Activity: Goal: Risk for activity intolerance will decrease Outcome: Progressing   Problem: Nutrition: Goal: Adequate nutrition will be maintained Outcome: Progressing

## 2024-04-30 NOTE — Progress Notes (Signed)
 "                                                                                                                                                         Daily Progress Note   Patient Name: [name]       Date: 04/30/2024 DOB: 1945/10/05  Age: 79 y.o. MRN#: [id number] Attending Physician: Pearlean Manus, MD Primary Care Physician: [name], MD Admit Date: 04/26/2024  Reason for Initial Consultation/Follow-up: Establishing goals of care  Subjective:  79 y.o. female  with past medical history of CKD stage IV, anemia of chronic kidney disease, COPD, arthritis, type 2 diabetes, homocystinemia, hypertension, right artery renal occlusion, multinodular goiter, presumed renal carcinoma admitted on 04/26/2024 with fever and left flank pain.    Workup revealed findings concerning for severe E. coli sepsis with septic shock felt to be secondary to urine source.  She was initiated on IV fluids and antibiotics and hydrocortisone .  She has some hypotension and is requiring vasopressor support.  Also noted to have an AKI in the setting of stage IV CKD with associated metabolic acidosis and hyperkalemia.  Patient admitted to ICU   Initial palliative care consult completed 04/28/2024.  Patient evaluated and attempted to discuss goals of care and advance care planning.  Patient quite lethargic.  Was able to have discussion with patient's family with her present and providing some insight however, patient expressed desire to think about our discussion prior to making any decisions and speak with her family.  She requested follow-up the following day to further discuss.  04/29/2024: multiple attempts to meet with pt to discuss GOC/ACP. She requests follow up the following day. Continue full code/full scope until clarified.   04/30/2024: labs reviewed. Renal function continues to be elevated. Some gradual improvements from admission. WBCs continue to be elevated (recently given steriods). H/H stable. Would  complete abx therapy. Vital signs reviewed and no significant vital sign abnormalities that warrant a change in care plan. MAR reviewed. No PRN symptom meds given on 24 hr lookback.   Today, patient is awake and alert.  She denies any pain currently.  She does get some mild pain and shortness of breath when she gets up and moves around which improves with rest.  She denies any nausea or vomiting.  She did do some therapy today and required quite a bit of assistance to get up to the chair.  She rates her appetite as fair.  ACP:  The patient and family consented to a voluntary Advance Care Planning Conversation in person(patient, daughter, husband)/over the phone(son).   I had a long conversation with patient and her family regarding goals of care and advance care plan.  Engaged in a detailed conversation with the patient about the seriousness of her acute illness (we discussed how critically ill  she was on arrival including requirement of vasopressors, altered mental status, significant lab and vital sign abnormalities), in light of her advanced age and complex medical history (possible kidney cancer, Chronic kidney disease with some evidence of progression, etc.), and explained the potential implications this may have for her recovery and long-term well-being.  We talked about what quality of life needs for her.  For her quality of life means being able to do for herself.  She would never want to go to a nursing facility, she would never want to be kept alive on machines, and she does not want to be a burden on her family.  She states that if she is unable to care for herself that she would not want her life prolonged.  She is very realistic about where she is currently.  She has also seeing numerous family members walk through serious illness and shares about these experiences (brother dialysis and bed ridden and developed bedsores, sister-in-law who was on mechanical ventilation, other family members who  became acutely ill and as a result had chronic cognitive, physical, and functional impairment requiring nursing facility level).  She is very clear that this is not something she would want for herself.  At this point, her goal is to get stronger to return home where she wants to be able to care for herself. She does not want to be bed ridden and wants to be able to feed, toilet, and bathe herself. She prefers emphasis on having good quality of life over longevity.  Given goals of care, engaged in a discussion of advance directives including the limitations and potential burdens of CPR and intubation particularly in the context of advanced age and serious underlying health conditions.  We also discussed how potential consequences of surviving CPR could significantly affect a person's quality of life currently at their definition of quality of life involved maintaining independence and functional ability.   Following goals of care discussion, patient opted for DNR/DNI status and this been documented in the med record.  We reviewed the completion of goldenrod form emphasizing that this constitutes a medical order and highlighted the importance of obtaining a copy and having placed in a clearly visible location in order to allow emergency services personnel to honor the order.  We also discussed dialysis.  As noted above, she had a brother who underwent dialysis we talked about how hard dialysis would deal with her at her age and with other comorbidities.  We also talked about her potential renal carcinoma.  She states that she would not want chemo, radiation, or dialysis.  She understands that if she elects to not pursue these things it could possibly shorten her life but she is hoping for better quality of life and optimal function to the end of her life and about how this would negatively impact her  We also discussed the impact that of your illness and immobility can have on a person's functional ability.  We  discussed that she was critically ill on arrival and therefore the past couple days of being in the bed have resulted in significant functional decline.  We discussed the various locations where therapy can occur i.e. in the home versus in a SNF.  We discussed that the level of therapy that she is needing (skilled 3-5 days a week) is typically performed in a skilled nursing facility.  While she prefers to be at home she is amenable to going to a skilled nursing facility for short-term rehab with  ultimate goal to get stronger and return home.  We also completed a MOST form. Reviewed the MOST form in detail with the patient, including how its components reflect her care preferences (provided specific education regarding benefits and drawbacks of long-term IV fluids, antibiotics, and feeding tubes). Documentation was finalized and scanned into the electronic medical record.  Provided education regarding both hospice and palliative care services and the differences in admission criteria, level of support, and goals.  At this time, patient most appropriate for palliative care.  Therefore provided education on the philosophy and goals of palliative care including focus on symptom management, quality of life, and how it can help patient and family navigate the care system.  Reviewed eligibility for outpatient services and offered a referral.  Patient and family are interested in referral to outpatient palliative care.  Will have TOC send a referral.  I completed a MOST form today. The patient and family outlined their wishes for the following treatment decisions:  Cardiopulmonary Resuscitation: Do Not Attempt Resuscitation (DNR/No CPR)  Medical Interventions: Limited Additional Interventions: Use medical treatment, IV fluids and cardiac monitoring as indicated, DO NOT USE intubation or mechanical ventilation. May consider use of less invasive airway support such as BiPAP or CPAP. Also provide comfort measures.  Transfer to the hospital if indicated. Avoid intensive care.   Antibiotics: Antibiotics if indicated  IV Fluids: IV fluids for a defined trial period  Feeding Tube: No feeding tube    Questions and concerns were addressed.  I thanked the patient and family for her willingness to have this conversation.  Family was also grateful for the conversation.  The family was encouraged to call with questions or concerns.  PMT will continue to support holistically.  Outcome of the conversations and/or documents completed: MOST/golden rod  I spent 43 minutes providing separately identifiable ACP services with the patient and/or surrogate decision maker in a voluntary, in-person conversation discussing the patient's wishes and goals as detailed in the above note.  Chart review/care coordination:  Completed extensive chart review including EPIC notes, medication administration record, vital signs, and labs. Coordinated care with bedside nursing staff, TOC, and attending physician (attending physician aware patient is agreeable to short-term rehab, updated on CODE STATUS and outpatient palliative referral).   Length of Stay: 4   Physical Exam Vitals and nursing note reviewed.  Constitutional:      General: She is not in acute distress.    Appearance: She is not toxic-appearing.     Comments: Chronically ill appearing  Pulmonary:     Effort: Pulmonary effort is normal. No respiratory distress.  Skin:    General: Skin is warm and dry.     Findings: No rash (.DIAG).  Neurological:     Mental Status: She is alert.             Vital Signs: BP (!) 155/81 (BP Location: Right Arm)   Pulse 84   Temp 97.8 F (36.6 C) (Oral)   Resp 20   Ht 5' 9 (1.753 m)   Wt 79.2 kg   LMP  (LMP Unknown)   SpO2 95%   BMI 25.78 kg/m  SpO2: SpO2: 95 % O2 Device: O2 Device: Room Air O2 Flow Rate: O2 Flow Rate (L/min): 4 L/min      Palliative Assessment/Data: 40-50%   Palliative Care Assessment & Plan    Patient Profile/Assessment:  79 year old female with numerous chronic medical problems admitted for severe sepsis secondary to E. coli felt to be from  urinary source.  Treated with IV antibiotics, IV fluids, and IV steroids.  Required vasopressor support.  Fortunately, with the above treatments she stabilized medically and was able to be weaned off of vasopressors.  Also noted to have an AKI in the setting of stage IV CKD and a metabolic acidosis.  Labs and clinical status appears to be gradually improving.  Initial palliative meeting conducted on 04/28/2024.  Goals of care and ACP introduced but declined to make any decisions at this time.  Follow-up on 04/29/2024 for additional discussion. Requested I return on 04/30/2024 for further discussion.  Today, met with patient with husband and daughter at bedside.  Son available via phone.  Discussed patient's current health status, goals of care, and ACP.  Patient elected to transition from full code to DNR/DNI.  Continue to treat the treatable.  Open to discharge to skilled nursing facility for some rehab.  Ultimate plan is to return home with family.  MOST and goldenrod forms also completed.  Plan will be to discharge today as she has been [facility] to [facility] rehab.   Recommendations/Plan:  DNR/DNI Discharge to SNF for skilled rehab Continue current symptom regimen Outpatient palliative referral   Symptom management:  Symptoms stable at present, therefore continue symptom regimen per admitting team with PMT available as needed for support   Prognosis: Guarded/improving    Discharge Planning: Skilled Nursing Facility for rehab with Palliative care service follow-up    Detailed review of medical records (labs, imaging, vital signs), medically appropriate exam, discussed with treatment team, counseling and education to patient, family, & staff, documenting clinical information, medication management, coordination of care    Billing based on  MDM: moderate  Problems Addressed: One or more chronic illnesses with severe exacerbation, progression, or side effects of treatment.  Amount and/or Complexity of Data: Category 3:Discussion of management or test interpretation with external physician/other qualified health care professional/appropriate source (not separately reported)  Risks: n/a         Laymon CHRISTELLA Pinal, NP  Palliative Medicine Team Team phone # [phone]  Thank you for allowing the Palliative Medicine Team to assist in the care of this patient. Please utilize secure chat with additional questions, if there is no response within 30 minutes please call the above phone number.  Palliative Medicine Team providers are available by phone from 7am to 7pm daily and can be reached through the team cell phone.  Should this patient require assistance outside of these hours, please call the patient's attending physician.   "

## 2024-04-30 NOTE — TOC Transition Note (Signed)
 Transition of Care [facility]) - Discharge Note   Patient Details  Name: [name] MRN: [id number] Date of Birth: [birth year]  [facility] CM/SW Contact:  Hoy DELENA Bigness, LCSW Phone Number: 04/30/2024, 2:30 PM   Clinical Narrative:    Pt to discharge to [facility] for STR. Pt will be going to room 413-2. RN to call report to [phone]. Pt agreeable to outpatient palliative care referral and has selected [facility] as agency of preference. Referral made to [facility] with [facility] for palliative care services. [facility] called for transport at 3:04pm.    Final next level of care: Skilled Nursing Facility Barriers to Discharge: Barriers Resolved   Patient Goals and CMS Choice Patient states their goals for this hospitalization and ongoing recovery are:: return home CMS Medicare.gov Compare Post Acute Care list provided to:: Patient Choice offered to / list presented to : Spouse [place] ownership interest in [facility].provided to:: Spouse    Discharge Placement   Existing PASRR number confirmed : 04/28/24          Patient chooses bed at: [facility] Patient to be transferred to facility by: [facility] Name of family member notified: Patient Patient and family notified of of transfer: 04/30/24  Discharge Plan and Services Additional resources added to the After Visit Summary for   In-house Referral: Clinical Social Work              DME Arranged: N/A DME Agency: NA                  Social Drivers of Health (SDOH) Interventions SDOH Screenings   Food Insecurity: No Food Insecurity (04/27/2024)  Housing: Low Risk (04/27/2024)  Transportation Needs: No Transportation Needs (04/27/2024)  Utilities: Not At Risk (04/27/2024)  Depression (PHQ2-9): Low Risk (04/10/2024)  Social Connections: Socially Integrated (04/27/2024)  Tobacco Use: High Risk (04/26/2024)     Readmission Risk Interventions    04/30/2024    2:29 PM 04/27/2024    1:06 PM  Readmission  Risk Prevention Plan  Transportation Screening Complete Complete  PCP or Specialist Appt within 3-5 Days Complete   HRI or Home Care Consult Complete Complete  Social Work Consult for Recovery Care Planning/Counseling Complete Complete  Palliative Care Screening Complete Not Applicable  Medication Review [employer]) Complete Complete

## 2024-04-30 NOTE — Care Management Important Message (Signed)
 Important Message  Patient Details  Name: [name] MRN: [id number] Date of Birth: [birth year]   Important Message Given:  Yes - Medicare IM     Norelle Runnion L Lauriel Helin 04/30/2024, 2:22 PM

## 2024-04-30 NOTE — Progress Notes (Signed)
 Report called to [facility] LPN at [facility].

## 2024-04-30 NOTE — Progress Notes (Signed)
 Mobility Specialist Progress Note:    04/30/24 1118  Mobility  Activity Pivoted/transferred from chair to bed  Level of Assistance Minimal assist, patient does 75% or more  Assistive Device Front wheel walker  Distance Ambulated (ft) 3 ft  Range of Motion/Exercises Active;All extremities  Activity Response Tolerated well  Mobility Referral Yes  Mobility visit 1 Mobility  Mobility Specialist Start Time (ACUTE ONLY) 1118  Mobility Specialist Stop Time (ACUTE ONLY) 1137  Mobility Specialist Time Calculation (min) (ACUTE ONLY) 19 min   Pt received in chair, requesting assistance back to bed. Required MinA to stand and transfer with RW. Tolerated well, generalized weakness. Alarm on, family in room. All needs met.   Martie Fulgham Mobility Specialist Please contact via Special Educational Needs Teacher or  Rehab office at [phone]

## 2024-05-06 ENCOUNTER — Inpatient Hospital Stay

## 2024-05-08 ENCOUNTER — Inpatient Hospital Stay: Admitting: Oncology
